# Patient Record
Sex: Male | Born: 1935 | Race: White | Hispanic: No | Smoking: Former smoker
Health system: Southern US, Community
[De-identification: ages and names within clinical notes are randomized; demographics above are authoritative.]

## PROBLEM LIST (undated history)

## (undated) DIAGNOSIS — R059 Cough, unspecified: Secondary | ICD-10-CM

## (undated) DIAGNOSIS — E785 Hyperlipidemia, unspecified: Secondary | ICD-10-CM

## (undated) DIAGNOSIS — E119 Type 2 diabetes mellitus without complications: Secondary | ICD-10-CM

## (undated) DIAGNOSIS — Z87442 Personal history of urinary calculi: Secondary | ICD-10-CM

## (undated) DIAGNOSIS — R05 Cough: Secondary | ICD-10-CM

## (undated) DIAGNOSIS — D649 Anemia, unspecified: Secondary | ICD-10-CM

## (undated) DIAGNOSIS — I4892 Unspecified atrial flutter: Secondary | ICD-10-CM

## (undated) DIAGNOSIS — J029 Acute pharyngitis, unspecified: Secondary | ICD-10-CM

## (undated) DIAGNOSIS — Z950 Presence of cardiac pacemaker: Secondary | ICD-10-CM

## (undated) DIAGNOSIS — R062 Wheezing: Secondary | ICD-10-CM

## (undated) DIAGNOSIS — I2510 Atherosclerotic heart disease of native coronary artery without angina pectoris: Secondary | ICD-10-CM

## (undated) DIAGNOSIS — Z951 Presence of aortocoronary bypass graft: Secondary | ICD-10-CM

## (undated) DIAGNOSIS — M1990 Unspecified osteoarthritis, unspecified site: Secondary | ICD-10-CM

## (undated) DIAGNOSIS — R197 Diarrhea, unspecified: Secondary | ICD-10-CM

## (undated) DIAGNOSIS — J449 Chronic obstructive pulmonary disease, unspecified: Secondary | ICD-10-CM

## (undated) DIAGNOSIS — I509 Heart failure, unspecified: Secondary | ICD-10-CM

## (undated) DIAGNOSIS — M7989 Other specified soft tissue disorders: Secondary | ICD-10-CM

## (undated) DIAGNOSIS — I5189 Other ill-defined heart diseases: Secondary | ICD-10-CM

## (undated) DIAGNOSIS — I10 Essential (primary) hypertension: Secondary | ICD-10-CM

## (undated) DIAGNOSIS — I441 Atrioventricular block, second degree: Secondary | ICD-10-CM

## (undated) HISTORY — DX: Cough, unspecified: R05.9

## (undated) HISTORY — DX: Atrioventricular block, second degree: I44.1

## (undated) HISTORY — DX: Diarrhea, unspecified: R19.7

## (undated) HISTORY — DX: Hyperlipidemia, unspecified: E78.5

## (undated) HISTORY — PX: OTHER SURGICAL HISTORY: SHX169

## (undated) HISTORY — DX: Type 2 diabetes mellitus without complications: E11.9

## (undated) HISTORY — DX: Unspecified atrial flutter: I48.92

## (undated) HISTORY — DX: Heart failure, unspecified: I50.9

## (undated) HISTORY — DX: Acute pharyngitis, unspecified: J02.9

## (undated) HISTORY — DX: Essential (primary) hypertension: I10

## (undated) HISTORY — DX: Presence of aortocoronary bypass graft: Z95.1

## (undated) HISTORY — DX: Cough: R05

## (undated) HISTORY — DX: Other ill-defined heart diseases: I51.89

## (undated) HISTORY — DX: Chronic obstructive pulmonary disease, unspecified: J44.9

## (undated) HISTORY — DX: Wheezing: R06.2

## (undated) HISTORY — PX: CORONARY ARTERY BYPASS GRAFT: SHX141

## (undated) HISTORY — DX: Other specified soft tissue disorders: M79.89

---

## 2009-03-10 ENCOUNTER — Emergency Department (HOSPITAL_COMMUNITY): Admission: EM | Admit: 2009-03-10 | Discharge: 2009-03-10 | Payer: Self-pay | Admitting: Family Medicine

## 2009-09-03 ENCOUNTER — Encounter: Admission: RE | Admit: 2009-09-03 | Discharge: 2009-09-03 | Payer: Self-pay | Admitting: General Surgery

## 2009-10-02 ENCOUNTER — Ambulatory Visit (HOSPITAL_COMMUNITY): Admission: RE | Admit: 2009-10-02 | Discharge: 2009-10-02 | Payer: Self-pay | Admitting: General Surgery

## 2010-05-22 LAB — DIFFERENTIAL
Basophils Absolute: 0 10*3/uL (ref 0.0–0.1)
Basophils Relative: 1 % (ref 0–1)
Eosinophils Absolute: 0.1 10*3/uL (ref 0.0–0.7)
Lymphocytes Relative: 32 % (ref 12–46)
Neutro Abs: 3.4 10*3/uL (ref 1.7–7.7)

## 2010-05-22 LAB — COMPREHENSIVE METABOLIC PANEL
AST: 18 U/L (ref 0–37)
Albumin: 4.3 g/dL (ref 3.5–5.2)
Alkaline Phosphatase: 34 U/L — ABNORMAL LOW (ref 39–117)
BUN: 11 mg/dL (ref 6–23)
CO2: 31 mEq/L (ref 19–32)
Calcium: 9.9 mg/dL (ref 8.4–10.5)
GFR calc non Af Amer: >60 mL/min (ref >60)
Potassium: 4.2 mEq/L (ref 3.5–5.1)
Sodium: 143 mEq/L (ref 135–145)
Total Bilirubin: 1.4 mg/dL — ABNORMAL HIGH (ref 0.3–1.2)
Total Protein: 7.7 g/dL (ref 6.0–8.3)

## 2010-05-22 LAB — GLUCOSE, CAPILLARY
Glucose-Capillary: 124 mg/dL — ABNORMAL HIGH (ref 70–99)
Glucose-Capillary: 129 mg/dL — ABNORMAL HIGH (ref 70–99)

## 2010-05-22 LAB — CBC
MCHC: 34.1 g/dL (ref 30.0–36.0)
Platelets: 131 10*3/uL — ABNORMAL LOW (ref 150–400)
RDW: 13.6 % (ref 11.5–15.5)

## 2010-05-22 LAB — SURGICAL PCR SCREEN: Staphylococcus aureus: POSITIVE — AB

## 2010-09-09 ENCOUNTER — Emergency Department (HOSPITAL_COMMUNITY): Payer: Medicare Other

## 2010-09-09 ENCOUNTER — Inpatient Hospital Stay (HOSPITAL_COMMUNITY)
Admission: EM | Admit: 2010-09-09 | Discharge: 2010-09-18 | DRG: 234 | Disposition: A | Discharge status: Home / Self Care | Payer: Medicare Other | Attending: Surgery | Admitting: Surgery

## 2010-09-09 DIAGNOSIS — Z7901 Long term (current) use of anticoagulants: Secondary | ICD-10-CM

## 2010-09-09 DIAGNOSIS — Y9210 Unspecified residential institution as the place of occurrence of the external cause: Secondary | ICD-10-CM | POA: Diagnosis not present

## 2010-09-09 DIAGNOSIS — E8779 Other fluid overload: Secondary | ICD-10-CM | POA: Diagnosis not present

## 2010-09-09 DIAGNOSIS — E1169 Type 2 diabetes mellitus with other specified complication: Secondary | ICD-10-CM | POA: Diagnosis not present

## 2010-09-09 DIAGNOSIS — D62 Acute posthemorrhagic anemia: Secondary | ICD-10-CM | POA: Diagnosis not present

## 2010-09-09 DIAGNOSIS — Z8711 Personal history of peptic ulcer disease: Secondary | ICD-10-CM

## 2010-09-09 DIAGNOSIS — I10 Essential (primary) hypertension: Secondary | ICD-10-CM | POA: Diagnosis present

## 2010-09-09 DIAGNOSIS — Z7982 Long term (current) use of aspirin: Secondary | ICD-10-CM

## 2010-09-09 DIAGNOSIS — Y832 Surgical operation with anastomosis, bypass or graft as the cause of abnormal reaction of the patient, or of later complication, without mention of misadventure at the time of the procedure: Secondary | ICD-10-CM | POA: Diagnosis not present

## 2010-09-09 DIAGNOSIS — E7800 Pure hypercholesterolemia, unspecified: Secondary | ICD-10-CM | POA: Diagnosis present

## 2010-09-09 DIAGNOSIS — I4510 Unspecified right bundle-branch block: Secondary | ICD-10-CM | POA: Diagnosis present

## 2010-09-09 DIAGNOSIS — E785 Hyperlipidemia, unspecified: Secondary | ICD-10-CM | POA: Diagnosis present

## 2010-09-09 DIAGNOSIS — Z79899 Other long term (current) drug therapy: Secondary | ICD-10-CM

## 2010-09-09 DIAGNOSIS — F17200 Nicotine dependence, unspecified, uncomplicated: Secondary | ICD-10-CM | POA: Diagnosis present

## 2010-09-09 DIAGNOSIS — Z8249 Family history of ischemic heart disease and other diseases of the circulatory system: Secondary | ICD-10-CM

## 2010-09-09 DIAGNOSIS — I2510 Atherosclerotic heart disease of native coronary artery without angina pectoris: Principal | ICD-10-CM | POA: Diagnosis present

## 2010-09-09 DIAGNOSIS — I4892 Unspecified atrial flutter: Secondary | ICD-10-CM | POA: Diagnosis not present

## 2010-09-09 DIAGNOSIS — I2582 Chronic total occlusion of coronary artery: Secondary | ICD-10-CM | POA: Diagnosis present

## 2010-09-09 DIAGNOSIS — I519 Heart disease, unspecified: Secondary | ICD-10-CM | POA: Diagnosis not present

## 2010-09-09 DIAGNOSIS — I200 Unstable angina: Secondary | ICD-10-CM | POA: Diagnosis present

## 2010-09-09 LAB — DIFFERENTIAL
Basophils Absolute: 0 10*3/uL (ref 0.0–0.1)
Basophils Relative: 1 % (ref 0–1)
Eosinophils Absolute: 0.1 10*3/uL (ref 0.0–0.7)
Lymphocytes Relative: 41 % (ref 12–46)
Neutro Abs: 3.1 10*3/uL (ref 1.7–7.7)

## 2010-09-09 LAB — BASIC METABOLIC PANEL
BUN: 17 mg/dL (ref 6–23)
Chloride: 104 mEq/L (ref 96–112)
Creatinine, Ser: 1.03 mg/dL (ref 0.50–1.35)
GFR calc Af Amer: >60 mL/min (ref >60)
Potassium: 3.5 mEq/L (ref 3.5–5.1)
Sodium: 144 mEq/L (ref 135–145)

## 2010-09-09 LAB — CK TOTAL AND CKMB (NOT AT ARMC)
CK, MB: 2.4 ng/mL (ref 0.3–4.0)
Relative Index: INVALID (ref 0.0–2.5)
Total CK: 80 U/L (ref 7–232)

## 2010-09-09 LAB — HEPATIC FUNCTION PANEL
ALT: 16 U/L (ref 0–53)
Albumin: 4.3 g/dL (ref 3.5–5.2)
Alkaline Phosphatase: 40 U/L (ref 39–117)
Indirect Bilirubin: 0.4 mg/dL (ref 0.3–0.9)
Total Protein: 7.5 g/dL (ref 6.0–8.3)

## 2010-09-09 LAB — CBC
MCH: 33.2 pg (ref 26.0–34.0)
MCHC: 35.4 g/dL (ref 30.0–36.0)
MCV: 93.7 fL (ref 78.0–100.0)
RDW: 13.1 % (ref 11.5–15.5)
WBC: 6.4 10*3/uL (ref 4.0–10.5)

## 2010-09-09 LAB — TROPONIN I: Troponin I: <0.3 ng/mL (ref <0.30)

## 2010-09-09 LAB — LIPASE, BLOOD: Lipase: 39 U/L (ref 11–59)

## 2010-09-10 DIAGNOSIS — I2510 Atherosclerotic heart disease of native coronary artery without angina pectoris: Secondary | ICD-10-CM

## 2010-09-10 HISTORY — PX: CARDIAC CATHETERIZATION: SHX172

## 2010-09-10 LAB — CARDIAC PANEL(CRET KIN+CKTOT+MB+TROPI)
CK, MB: 2 ng/mL (ref 0.3–4.0)
Relative Index: INVALID (ref 0.0–2.5)
Troponin I: <0.3 ng/mL (ref <0.30)

## 2010-09-10 LAB — CBC
MCV: 92.8 fL (ref 78.0–100.0)
RBC: 4.47 MIL/uL (ref 4.22–5.81)
RDW: 13 % (ref 11.5–15.5)
WBC: 9.3 10*3/uL (ref 4.0–10.5)

## 2010-09-10 LAB — COMPREHENSIVE METABOLIC PANEL
AST: 14 U/L (ref 0–37)
Alkaline Phosphatase: 32 U/L — ABNORMAL LOW (ref 39–117)
CO2: 28 mEq/L (ref 19–32)
Calcium: 9.5 mg/dL (ref 8.4–10.5)
Chloride: 103 mEq/L (ref 96–112)
Creatinine, Ser: 0.77 mg/dL (ref 0.50–1.35)
GFR calc non Af Amer: >60 mL/min (ref >60)
Potassium: 3.5 mEq/L (ref 3.5–5.1)
Total Bilirubin: 0.7 mg/dL (ref 0.3–1.2)

## 2010-09-10 LAB — MRSA PCR SCREENING: MRSA by PCR: NEGATIVE

## 2010-09-10 LAB — MAGNESIUM: Magnesium: 1.9 mg/dL (ref 1.5–2.5)

## 2010-09-10 LAB — GLUCOSE, CAPILLARY
Glucose-Capillary: 135 mg/dL — ABNORMAL HIGH (ref 70–99)
Glucose-Capillary: 141 mg/dL — ABNORMAL HIGH (ref 70–99)
Glucose-Capillary: 127 mg/dL — ABNORMAL HIGH (ref 70–99)

## 2010-09-10 LAB — APTT: aPTT: 31 seconds (ref 24–37)

## 2010-09-10 LAB — PROTIME-INR: INR: 0.88 (ref 0.00–1.49)

## 2010-09-10 LAB — LIPID PANEL
Total CHOL/HDL Ratio: 3.5 RATIO
Triglycerides: 94 mg/dL (ref <150)

## 2010-09-10 LAB — HEMOGLOBIN A1C
Hgb A1c MFr Bld: 6.6 % — ABNORMAL HIGH (ref <5.7)
Mean Plasma Glucose: 143 mg/dL — ABNORMAL HIGH (ref <117)

## 2010-09-11 DIAGNOSIS — Z01810 Encounter for preprocedural cardiovascular examination: Secondary | ICD-10-CM

## 2010-09-11 DIAGNOSIS — I2510 Atherosclerotic heart disease of native coronary artery without angina pectoris: Secondary | ICD-10-CM

## 2010-09-11 LAB — CBC
HCT: 36.1 % — ABNORMAL LOW (ref 39.0–52.0)
Hemoglobin: 12.7 g/dL — ABNORMAL LOW (ref 13.0–17.0)
MCH: 33.2 pg (ref 26.0–34.0)
MCHC: 35.2 g/dL (ref 30.0–36.0)

## 2010-09-11 LAB — BASIC METABOLIC PANEL
BUN: 9 mg/dL (ref 6–23)
Calcium: 8.8 mg/dL (ref 8.4–10.5)
Creatinine, Ser: 0.89 mg/dL (ref 0.50–1.35)
GFR calc non Af Amer: >60 mL/min (ref >60)
Glucose, Bld: 127 mg/dL — ABNORMAL HIGH (ref 70–99)

## 2010-09-12 LAB — COMPREHENSIVE METABOLIC PANEL
ALT: 12 U/L (ref 0–53)
AST: 13 U/L (ref 0–37)
Calcium: 8.7 mg/dL (ref 8.4–10.5)
GFR calc Af Amer: >60 mL/min (ref >60)
Glucose, Bld: 141 mg/dL — ABNORMAL HIGH (ref 70–99)
Sodium: 140 mEq/L (ref 135–145)
Total Protein: 6.6 g/dL (ref 6.0–8.3)

## 2010-09-12 LAB — BLOOD GAS, ARTERIAL
Drawn by: 31297
FIO2: 0.21 %
O2 Saturation: 97 %
pO2, Arterial: 84.2 mmHg (ref 80.0–100.0)

## 2010-09-12 LAB — TYPE AND SCREEN

## 2010-09-12 LAB — SURGICAL PCR SCREEN: MRSA, PCR: NEGATIVE

## 2010-09-12 LAB — CBC
MCH: 31.9 pg (ref 26.0–34.0)
MCHC: 34.1 g/dL (ref 30.0–36.0)
Platelets: 122 10*3/uL — ABNORMAL LOW (ref 150–400)
RDW: 13.1 % (ref 11.5–15.5)

## 2010-09-13 ENCOUNTER — Inpatient Hospital Stay (HOSPITAL_COMMUNITY): Payer: Medicare Other

## 2010-09-13 DIAGNOSIS — Z951 Presence of aortocoronary bypass graft: Secondary | ICD-10-CM

## 2010-09-13 DIAGNOSIS — I2510 Atherosclerotic heart disease of native coronary artery without angina pectoris: Secondary | ICD-10-CM

## 2010-09-13 HISTORY — DX: Presence of aortocoronary bypass graft: Z95.1

## 2010-09-13 HISTORY — PX: OTHER SURGICAL HISTORY: SHX169

## 2010-09-13 LAB — HEMOGLOBIN AND HEMATOCRIT, BLOOD: Hemoglobin: 10.2 g/dL — ABNORMAL LOW (ref 13.0–17.0)

## 2010-09-13 LAB — CBC
HCT: 35.6 % — ABNORMAL LOW (ref 39.0–52.0)
HCT: 29.3 % — ABNORMAL LOW (ref 39.0–52.0)
Hemoglobin: 12.5 g/dL — ABNORMAL LOW (ref 13.0–17.0)
Hemoglobin: 10.4 g/dL — ABNORMAL LOW (ref 13.0–17.0)
MCH: 32.9 pg (ref 26.0–34.0)
MCHC: 35.1 g/dL (ref 30.0–36.0)
MCHC: 35.5 g/dL (ref 30.0–36.0)
MCV: 93.7 fL (ref 78.0–100.0)
Platelets: 90 10*3/uL — ABNORMAL LOW (ref 150–400)
RBC: 3.15 MIL/uL — ABNORMAL LOW (ref 4.22–5.81)
RDW: 13 % (ref 11.5–15.5)
WBC: 6.4 10*3/uL (ref 4.0–10.5)

## 2010-09-13 LAB — POCT I-STAT 4, (NA,K, GLUC, HGB,HCT)
Glucose, Bld: 150 mg/dL — ABNORMAL HIGH (ref 70–99)
Glucose, Bld: 142 mg/dL — ABNORMAL HIGH (ref 70–99)
Glucose, Bld: 104 mg/dL — ABNORMAL HIGH (ref 70–99)
HCT: 37 % — ABNORMAL LOW (ref 39.0–52.0)
HCT: 28 % — ABNORMAL LOW (ref 39.0–52.0)
HCT: 30 % — ABNORMAL LOW (ref 39.0–52.0)
Hemoglobin: 12.6 g/dL — ABNORMAL LOW (ref 13.0–17.0)
Hemoglobin: 9.5 g/dL — ABNORMAL LOW (ref 13.0–17.0)
Hemoglobin: 10.2 g/dL — ABNORMAL LOW (ref 13.0–17.0)
Potassium: 3.8 mEq/L (ref 3.5–5.1)
Potassium: 4.9 mEq/L (ref 3.5–5.1)
Potassium: 3.5 mEq/L (ref 3.5–5.1)
Sodium: 141 mEq/L (ref 135–145)
Sodium: 137 mEq/L (ref 135–145)

## 2010-09-13 LAB — POCT I-STAT, CHEM 8
Chloride: 108 mEq/L (ref 96–112)
Creatinine, Ser: 0.8 mg/dL (ref 0.50–1.35)
Glucose, Bld: 168 mg/dL — ABNORMAL HIGH (ref 70–99)
HCT: 29 % — ABNORMAL LOW (ref 39.0–52.0)
Potassium: 4.5 mEq/L (ref 3.5–5.1)
Sodium: 142 mEq/L (ref 135–145)

## 2010-09-13 LAB — POCT I-STAT 3, ART BLOOD GAS (G3+)
Bicarbonate: 25.4 mEq/L — ABNORMAL HIGH (ref 20.0–24.0)
Bicarbonate: 23.5 mEq/L (ref 20.0–24.0)
O2 Saturation: 100 %
O2 Saturation: 98 %
Patient temperature: 35.5
TCO2: 24 mmol/L (ref 0–100)
pCO2 arterial: 40.6 mmHg (ref 35.0–45.0)
pH, Arterial: 7.419 (ref 7.350–7.450)
pH, Arterial: 7.355 (ref 7.350–7.450)
pO2, Arterial: 308 mmHg — ABNORMAL HIGH (ref 80.0–100.0)
pO2, Arterial: 118 mmHg — ABNORMAL HIGH (ref 80.0–100.0)

## 2010-09-13 LAB — PROTIME-INR: INR: 1.3 (ref 0.00–1.49)

## 2010-09-13 LAB — MAGNESIUM: Magnesium: 2.7 mg/dL — ABNORMAL HIGH (ref 1.5–2.5)

## 2010-09-13 LAB — APTT: aPTT: 37 seconds (ref 24–37)

## 2010-09-13 LAB — CREATININE, SERUM: GFR calc non Af Amer: >60 mL/min (ref >60)

## 2010-09-13 NOTE — Cardiovascular Report (Signed)
NAME:  Jesus Oconnor, HANNER.:  [id number]  MEDICAL RECORD NO.:  [id number]  LOCATION:  2032                         FACILITY:  [facility]  PHYSICIAN:  Rolland Porter, MD DATE OF BIRTH:  Jul 18, 1933  DATE OF PROCEDURE:  09/12/2010 DATE OF DISCHARGE:                           CARDIAC CATHETERIZATION   PRIMARY CARE PHYSICIAN:  Royetta Crochet. Karlton Lemon, MD  PRIMARY CARDIOLOGIST:  Rolland Porter, MD  PROCEDURE PERFORMED: 1. Left heart catheterization via the 5-French right radial access. 2. Left ventriculogram in the RAO projection with 12 mL of contrast     per second for a total of 25 mL. 3. Native coronary angiography.  HISTORY:  Jesus Oconnor is a very pleasant 77 year old gentleman who with a history of hypertension, dyslipidemia, diabetes, and smoking as well as intraventricular conduction delay on EKG as well as a strong family history of coronary disease including his brother who just had bypass surgery had some substernal chest discomfort after eating a large meal on the day prior to catheterization.  It continued intermittently throughout a couple of hours, so he went to the emergency room.  It was not relieved by nitroglycerin and cocktail.  However, based on the significance of his risk factors and the description of his discomfort, this was thought to be likely unstable angina and after the risks, benefits, alternatives, and indications of either stress test versus heart catheterization were discussed, the patient was in agreement to go forward with cardiac catheterization.  An informed consent was obtained with signed form placed on the chart.  INDICATIONS: 1. Unstable angina. 2. Significant cardiac risk factors.  PROCEDURE IN DETAIL:  The patient was brought to second floor cardiac catheterization lab in the fasting state.  He was prepped and draped in the usual sterile fashion for the right radial access after part Barbeau test demonstrated  excellent ulnar artery collaterals to the right hand. After time-out period was performed, the patient was sedated with intravenous Versed and fentanyl.  The right wrist was anesthetized using 1% subcutaneous lidocaine and the right radial artery was accessed using the Seldinger technique with placement of 5-French glide sheath.  The sheath was aspirated, flushed, and infiltrated with total of 10 mL of standard radial cocktail as described below.  Then, a 5-French TIG 4.0 catheter was advanced over the Versacore wire into the ascending aorta. It was then used to engage the right coronary artery.  Multiple angiographic views of right coronary system were obtained.  I was unable to engage the left coronary artery with this catheter and therefore was exchanged for a 5-French JL-4 catheter which easily engaged the left coronary artery.  Multiple angiographic views of the left coronary artery system were obtained.  This catheter was then exchanged for an angled pigtail catheter which was advanced into the left ventricle for obtaining left ventricular pressures and LV gram.  After this, the catheter was removed completely out of the body over the wire with no complications.  The patient was stable for, during, and after the procedure.  The sheath was removed in the cath lab with placement of TR band at 10 mL of air.  The patient was transferred to the holding area for  monitoring and then back to the Step-Down Unit for ongoing care.  CATHETERIZATION STATISTICS: 1. Sedation:  1 mg Versed and 50 mcg fentanyl. 2. Contrast:  90 mL. 3. Heparin 4500 units intravenous given at the time of sheath     placement. 4. Radial cocktail was consistent of 5 mg of verapamil and 400 mcg of     nitroglycerin and 2 mL of 1% lidocaine.  HEMODYNAMIC RESULTS: 1. Left ventricular pressures 161/11 mmHg.  EDP of 22 mmHg. 2. Central aortic pressure 157/84 mmHg.  ANGIOGRAHIC FINDINGS: 1. The left main is a  large-caliber vessel that does bifurcate into a     LAD and circumflex.  No significant disease is noted in the left     main itself. 2. The LAD is a large-caliber vessel that reaches down to the apex.     There is a proximal long 50-60% tubular stenosis within about a 70-     80% stenosis in the midportion right up at the takeoff of the     second diagonal branch.  There are 2 moderate-sized diagonal     branches both of which have 90% ostial stenoses. 3. The circumflex is occluded very proximally after a small     intermediate branch and it does fill with collaterals from the     right. 4. The right coronary artery is a large dominant vessel that has     diffuse irregularities throughout the entire portion.  There is a     very large posterior descending artery and there is one of three     posterolateral branches which I am calling RPL-4 has a 90% mid     stenosis.  This is still at least a 2-0 vessel at that level. 5. Left ventriculogram demonstrated preserved ejection fraction of at     least 55% with no significant wall motion abnormalities and no     mitral regurgitation.  There is definitely calcified mitral     annulus.  IMPRESSION: 1. Severe multivessel disease with 100% occluded circumflex with the     right to left collaterals, severe diffuse left anterior descending     disease involving the ostia of both diagonal 1 and diagonal 2     vessels.  Also, 80-90% distal right posterolateral 4 lesion. 2. Preserved left ventricular ejection fraction with mildly increased     end diastolic pressure.  PLAN: 1. Based on the patient's risk factors and extensive disease,     especially in the LAD for long term survival purposes, the patient     warrants evaluation for coronary artery bypass surgery. 2. We will continue the aspirin, beta-blocker, statin, and ACE     inhibitor. 3. We will restart Lovenox subcu for treatment dose for his unstable     angina and I have contacted  Cardiothoracic Surgery for     consultation.          ______________________________ Rolland Porter, MD     DWH/MEDQ  D:  09/12/2010  T:  09/12/2010  Job:  [id number]  cc:   [facility] Gilford Raid, M.D. Royetta Crochet. Karlton Lemon, M.D.  Electronically Signed by Glenetta Hew MD on 09/13/2010 01:18:15 AM

## 2010-09-14 ENCOUNTER — Inpatient Hospital Stay (HOSPITAL_COMMUNITY): Payer: Medicare Other

## 2010-09-14 DIAGNOSIS — E1165 Type 2 diabetes mellitus with hyperglycemia: Secondary | ICD-10-CM

## 2010-09-14 LAB — GLUCOSE, CAPILLARY
Glucose-Capillary: 104 mg/dL — ABNORMAL HIGH (ref 70–99)
Glucose-Capillary: 142 mg/dL — ABNORMAL HIGH (ref 70–99)
Glucose-Capillary: 173 mg/dL — ABNORMAL HIGH (ref 70–99)
Glucose-Capillary: 75 mg/dL (ref 70–99)
Glucose-Capillary: 155 mg/dL — ABNORMAL HIGH (ref 70–99)
Glucose-Capillary: 150 mg/dL — ABNORMAL HIGH (ref 70–99)

## 2010-09-14 LAB — CBC
MCV: 93.5 fL (ref 78.0–100.0)
Platelets: 134 10*3/uL — ABNORMAL LOW (ref 150–400)
RBC: 3.08 MIL/uL — ABNORMAL LOW (ref 4.22–5.81)
RDW: 13.2 % (ref 11.5–15.5)
WBC: 7.8 10*3/uL (ref 4.0–10.5)

## 2010-09-14 LAB — MAGNESIUM: Magnesium: 2.4 mg/dL (ref 1.5–2.5)

## 2010-09-14 LAB — BASIC METABOLIC PANEL
CO2: 25 mEq/L (ref 19–32)
Chloride: 109 mEq/L (ref 96–112)
Creatinine, Ser: 0.74 mg/dL (ref 0.50–1.35)
GFR calc Af Amer: >60 mL/min (ref >60)
Potassium: 4 mEq/L (ref 3.5–5.1)
Sodium: 140 mEq/L (ref 135–145)

## 2010-09-15 ENCOUNTER — Inpatient Hospital Stay (HOSPITAL_COMMUNITY): Payer: Medicare Other

## 2010-09-15 LAB — BASIC METABOLIC PANEL
BUN: 17 mg/dL (ref 6–23)
Calcium: 7.8 mg/dL — ABNORMAL LOW (ref 8.4–10.5)
Creatinine, Ser: 0.96 mg/dL (ref 0.50–1.35)
GFR calc Af Amer: >60 mL/min (ref >60)
GFR calc non Af Amer: >60 mL/min (ref >60)
Glucose, Bld: 134 mg/dL — ABNORMAL HIGH (ref 70–99)

## 2010-09-15 LAB — CBC
HCT: 27.5 % — ABNORMAL LOW (ref 39.0–52.0)
Hemoglobin: 9.5 g/dL — ABNORMAL LOW (ref 13.0–17.0)
MCH: 32.8 pg (ref 26.0–34.0)
MCHC: 34.5 g/dL (ref 30.0–36.0)
MCV: 94.8 fL (ref 78.0–100.0)
RDW: 13.6 % (ref 11.5–15.5)

## 2010-09-15 LAB — GLUCOSE, CAPILLARY

## 2010-09-16 LAB — GLUCOSE, CAPILLARY
Glucose-Capillary: 133 mg/dL — ABNORMAL HIGH (ref 70–99)
Glucose-Capillary: 109 mg/dL — ABNORMAL HIGH (ref 70–99)

## 2010-09-17 LAB — GLUCOSE, CAPILLARY

## 2010-09-18 LAB — GLUCOSE, CAPILLARY: Glucose-Capillary: 123 mg/dL — ABNORMAL HIGH (ref 70–99)

## 2010-09-18 LAB — PROTIME-INR
INR: 1.03 (ref 0.00–1.49)
Prothrombin Time: 13.7 seconds (ref 11.6–15.2)

## 2010-10-11 ENCOUNTER — Other Ambulatory Visit: Payer: Self-pay | Admitting: Surgery

## 2010-10-11 DIAGNOSIS — I2510 Atherosclerotic heart disease of native coronary artery without angina pectoris: Secondary | ICD-10-CM

## 2010-10-12 ENCOUNTER — Ambulatory Visit
Admission: RE | Admit: 2010-10-12 | Discharge: 2010-10-12 | Disposition: A | Discharge status: Home / Self Care | Payer: Medicare Other | Source: Ambulatory Visit | Attending: Surgery | Admitting: Surgery

## 2010-10-12 ENCOUNTER — Ambulatory Visit: Payer: Medicare Other | Admitting: Surgery

## 2010-10-12 ENCOUNTER — Encounter: Payer: Medicare Other | Admitting: Surgery

## 2010-10-12 ENCOUNTER — Encounter (INDEPENDENT_AMBULATORY_CARE_PROVIDER_SITE_OTHER): Payer: Self-pay | Admitting: Surgery

## 2010-10-12 DIAGNOSIS — I2510 Atherosclerotic heart disease of native coronary artery without angina pectoris: Secondary | ICD-10-CM

## 2010-10-12 NOTE — Assessment & Plan Note (Addendum)
OFFICE VISIT  TAY, HUWE DOB:  1934-03-03                                        October 12, 2010 CHART #:  [id number]  The patient returned to my office today for followup status post coronary bypass graft surgery x5 on September 13, 2010.  He did have some postoperative atrial flutter and was sent home on Coumadin with plans for eventual cardioversion if he did not convert on his own.  Since discharge, his only complaint is that he has had some lower extremity swelling, worse on the right than the left.  He had some in the hospital postoperatively and was sent home on Lasix for 1-week.  He denies any chest pain or pressure.  He has had no shortness of breath.  He has not been ambulating very much due to the hot weather.  On physical examination, blood pressure 140/64, pulse is 74 and regular, respiratory rate is 18 unlabored.  Oxygen saturation on room air is 95%. He looks well.  Cardiac exam shows an irregular rate and rhythm.  His lungs reveal decreased breath sounds in the bases.  The chest incision is healing well and the sternum is stable.  There is moderate edema in the right lower leg and mild edema in the left lower leg to the knee level.  His left leg vein harvest incision is healing well.  There is slight separation of the lowermost stab incision where the vein was transected.  A followup chest x-ray today shows bilateral pleural effusions, slightly larger on the left than the right with bilateral lower lobe atelectasis. His x-ray overall seems a little better than at the time of discharge.  His medications are: 1. Coumadin 5 mg daily. 2. Lisinopril 10 mg daily. 3. Lopressor 12.5 mg b.i.d. 4. Aspirin 81 mg daily. 5. Metformin 1000 mg b.i.d. 6. Pravachol 80 mg at bedtime. 7. Effexor 75 mg 1/2 tablet b.i.d. 8. Xanax 0.5 mg at bedtime p.r.n.  IMPRESSION:  Overall, the patient is making a satisfactory recovery following his surgery.  He  still has some volume excess and I started him on Lasix 40 mg daily and potassium 20 mEq daily for 1 month.  I also wrote him for oxycodone IR 5 mg q.6 hours p.r.n. for pain #40.  I asked him to keep his legs elevated when he is not ambulating and watch his sodium and fluid intake.  I would be seeing him back in about 4 weeks and we will repeat his chest x-ray at that time.  He will continue to follow up with Dr. Debara Pickett, for his cardiology care.  Gilford Raid, M.D. Electronically Signed  BB/MEDQ  D:  10/12/2010  T:  10/12/2010  Job:  [id number]  cc:   Mali Hilty, MD

## 2010-10-25 NOTE — Discharge Summary (Signed)
NAME:  Jesus Oconnor, Jesus Oconnor NO.:  [id number]  MEDICAL RECORD NO.:  [id number]  LOCATION:  2037                         FACILITY:  [facility]  PHYSICIAN:  Gilford Raid, M.D.     DATE OF BIRTH:  Mar 12, 1933  DATE OF ADMISSION:  09/09/2010 DATE OF DISCHARGE:  09/18/2010                              DISCHARGE SUMMARY   PRIMARY ADMITTING DIAGNOSIS:  Chest pain.  ADDITIONAL/DISCHARGE DIAGNOSES: 1. Severe 3-vessel coronary artery disease. 2. Unstable angina. 3. Hypertension. 4. Dyslipidemia. 5. Type 2 diabetes mellitus. 6. History of tobacco abuse. 7. Postoperative atrial flutter.  PROCEDURES PERFORMED: 1. Cardiac catheterization. 2. Coronary artery bypass grafting x5 (left internal mammary artery to     the LAD, saphenous vein graft to the obtuse marginal, sequential     saphenous vein graft to the first and second diagonals, saphenous     vein graft to the fourth posterolateral branch of the right     coronary artery). 3. Endoscopic vein harvest right leg.  HISTORY:  The patient is a 77 year old male who presented to the emergency department on the date of this admission complaining of new- onset substernal chest discomfort which began after eating.  His first set of cardiac enzymes in the ER was negative.  He was given 3 sublingual nitroglycerin and a GI cocktail, and his chest discomfort persisted, although it did improve in nature.  His initial EKG showed no ischemic changes.  He did have a right bundle-branch block.  He was subsequently admitted by [facility] vascular for further evaluation and treatment.  HOSPITAL COURSE:  Mr. Stockstill was admitted and started on IV nitroglycerin and heparin.  He underwent cardiac catheterization on September 10, 2010, by Dr. Ellyn Hack and found to have severe 3-vessel coronary artery disease including a 50-60% proximal LAD and 70-80% midvessel stenosis.  There were 2 moderate-sized diagonal branches, both of which had 90%  ostial stenosis.  Left circumflex is occluded after a small intermediate branch with filling of 2 marginals by collaterals from the right.  The right coronary artery is dominant vessel with diffuse disease.  There was a 90% mid posterolateral stenosis.  Left ventricular function was well-preserved and the EF was estimated at 55%.  Because of his diffuse 3-vessel disease, it was felt that he would benefit from coronary artery bypass grafting.  He was seen in consultation by Dr. Gilford Raid and films were reviewed.  Dr. Cyndia Bent agreed with the need for surgical revascularization.  He explained all risks, benefits, and alternatives of surgery to the patient.  He agreed to proceed.  He remained stable without further shortness of breath in the course of his preoperative workup.  He did undergo carotid Doppler studies which showed a 40-59% ICA stenosis on the left with no stenosis on the right. He was taken to the operating room on September 13, 2010, and underwent coronary artery bypass grafting x5 as described above.  Please see previously dictated operative report for complete details of surgery. He tolerated the procedure well and was transferred to the SICU in stable condition.  He was extubated shortly after surgery.  He was hemodynamically stable and doing well on postop day #1.  His chest tubes  and hemodynamic monitoring lines were removed and he was able to be transferred to the Step-Down Unit.  His postoperative course has been notable for intermittent atrial flutter with controlled rate.  He was started on a beta-blocker and his dosage has been titrated upward, but he continues to have rate-controlled atrial flutter.  Cardiology has followed the patient and was recommended anticoagulation at this point and possible DCCV in the future.  He has been started on Coumadin 5 mg daily to be titrated based on his INRs.  He otherwise has remained stable.  He has been afebrile and his vital signs  have been stable.  He has been somewhat volume overloaded and was started on Lasix to which he is responding well.  He does remain edematous on physical exam and his weight remains about 3 kg above his preoperative weight.  He was restarted on metformin and his dose was titrated upward for elevated blood glucoses.  Presently, he is remaining fairly well controlled running in the 130-160 range.  His incisions are all healing well.  He is ambulating in the halls with Cardiac Rehab Phase I and is progressing well.  He is tolerating a regular diet and having normal bowel and bladder function.  His most recent labs showed sodium 138, potassium 4.0, BUN 17, creatinine 0.96.  Hemoglobin 9.5, hematocrit 27.5, platelets 132, white count 9.0.  His most recent chest x-ray shows small bilateral pleural effusions.  His rhythm will continued to be observed over the next 24 hours.  If his rhythm and rate remain stable and he is cleared by Cardiology, he will potentially be ready for discharge within the next 24 hours.  If his rhythm changes or becomes unstable, he may require cardioversion sooner rather than later.  DISCHARGE MEDICATIONS: 1. Coumadin 5 mg daily or as directed. 2. Lasix 40 mg daily x7 days. 3. Potassium 40 mEq daily x 7 days. 4. Lisinopril 10 mg daily. 5. Metoprolol 25 mg b.i.d. 6. Oxycodone IR 5-10 mg q.3-4 h. p.r.n. for pain. 7. Aspirin 81 mg daily. 8. Metformin 1000 mg b.i.d. 9. Pravachol 80 mg nightly.  DISCHARGE INSTRUCTIONS:  He is asked to refrain from driving, heavy lifting or strenuous activity.  He may continue ambulating daily and using his incentive spirometer.  He may shower daily and clean his incisions with soap and water.  He will continue a low-fat, low-sodium carbohydrate modified diet.  DISCHARGE FOLLOWUP:  He will need to see Dr. Ellyn Hack in 2 weeks and follow up with the [facility] within the next 48 hours for management of  his anticoagulation.  He will follow up with Dr. Cyndia Bent on October 12, 2010, with a chest x-ray from [place].  In the interim if he experiences any problems or has questions, he is asked to contact our office immediately.     Suzzanne Cloud, P.A.   ______________________________ Gilford Raid, M.D.    GC/MEDQ  D:  09/17/2010  T:  09/18/2010  Job:  [id number]  cc:   Royetta Crochet. Karlton Lemon, M.D. Rolland Porter, MD [facility] Office  Electronically Signed by Suzzanne Cloud P.A. on 09/24/2010 03:14:18 PM Electronically Signed by Gilford Raid M.D. on 10/25/2010 03:55:40 PM

## 2010-10-25 NOTE — Consult Note (Signed)
NAME:  Jesus Oconnor, WAKELY NO.:  [id number]  MEDICAL RECORD NO.:  [id number]  LOCATION:  2920                         FACILITY:  [facility]  PHYSICIAN:  Gilford Raid, M.D.     DATE OF BIRTH:  1933/07/17  DATE OF CONSULTATION:  09/10/2010 DATE OF DISCHARGE:                                CONSULTATION   REFERRING PHYSICIAN:  Rolland Porter, MD  REASON FOR CONSULTATION:  Severe three-vessel coronary artery disease.  CLINICAL HISTORY:  I was asked by Dr. Ellyn Hack to evaluate Mr. Bingenheimer for consideration of coronary artery bypass graft surgery.  He is a 77- year-old gentleman with a history of hypertension, dyslipidemia, diabetes, smoking, and a strong family history of heart disease, who presented with new onset of substernal chest discomfort, beginning yesterday after eating.  This was different than his typical indigestion which he has fairly frequently.  He never had similar symptoms.  He presented to the emergency room and his first set of cardiac enzymes was negative.  His chest discomfort continued despite 3 sublingual nitroglycerin and GI cocktail although it was slightly improved to about 1/10.  His initial electrocardiogram showed no acute ischemic changes. There is a right bundle-branch block.  He underwent cardiac catheterization today which showed severe three-vessel disease.  The LAD had a long proximal 50-60% stenosis and then about 70-80% midvessel stenosis.  There were 2 moderate-sized diagonal branches, both of which had 90% ostial stenoses.  The left circumflex was occluded after a small intermediate branch with filling of 2 marginals by collaterals from the right.  The right coronary artery was a large dominant vessel that was diffusely diseased and irregular.  The only significant stenosis was that a posterolateral branch that was about 90% narrowed in its midportion.  Left ventricular function was well-preserved and ejection fraction estimated  at 55%.  REVIEW OF SYSTEMS:  GENERAL:  He denies any fever or chills.  He has had no recent weight changes.  He denies fatigue, but is not very active. EYES:  Negative.  ENT:  He has not seen a dentist in years.  He denies any pain in his mouth.  ENDOCRINE:  He has adult-onset diabetes.  Denies hypothyroidism.  CARDIOVASCULAR:  He denies exertional dyspnea.  He has no PND or orthopnea.  He denies peripheral edema or palpitations. RESPIRATORY:  He denies cough and sputum production.  GI:  He has had a history of peptic ulcer disease in the past, treated medically.  He denies any melena or bright red blood per rectum.  GU:  He denies dysuria and hematuria.  MUSCULOSKELETAL:  He denies arthralgias and myalgias.  NEUROLOGIC:  He denies any focal weakness or numbness.  He denies dizziness and syncope.  He has never had TIA or stroke. HEMATOLOGIC:  Negative. ALLERGIES:  Negative.  PSYCHIATRIC:  Negative.  PAST MEDICAL HISTORY:  Significant for hypertension, diabetes, and dyslipidemia.  He is status post left inguinal and umbilical hernia repair about 1 year ago.  FAMILY HISTORY:  Strongly positive for cardiac disease.  His father died of myocardial infarction.  He has brother who has had coronary artery bypass surgery.  He has multiple uncles who died of myocardial infarction  and other relatives with coronary artery disease.  SOCIAL HISTORY:  He is retired.  He is married for over 81 years and lives with his wife.  His 2 sons are here with him today.  He has smoked 1 pack of cigarettes per day for many years.  He denies alcohol abuse.  PHYSICAL EXAMINATION:  VITAL SIGNS:  His blood pressure is 170/88, pulse 75 and regular, respiratory rate is 16 and unlabored, oxygen saturation on 2 liters nasal cannula is 98%. GENERAL:  He is an elderly white male, in no distress. HEENT:  Normocephalic and atraumatic.  Pupils are equal and reactive to light and accommodation.  Extraocular muscles are  intact.  Oropharynx is clear with poor dentition. NECK:  Normal carotid pulses bilaterally.  There are no bruits.  There is no adenopathy or thyromegaly. CARDIAC:  Regular rate and rhythm with normal S1 and S2.  There is no murmur, rub, or gallop. LUNGS:  Clear. ABDOMEN:  Active bowel sounds.  His abdomen is soft, mildly obese, and nontender.  No palpable masses or organomegaly. EXTREMITIES:  No peripheral edema.  Dorsalis pedis pulses are palpable bilaterally.  Posterior tibial pulses are not palpable on either side. NEUROLOGIC:  Alert and oriented x3.  Motor and sensory exams grossly normal. SKIN:  Warm and dry.  LABORATORY EXAMINATION:  An initial troponin of less than 0.3.  His initial CPK was 80 with an MB of 2.4.  His second set of cardiac enzyme showed a troponin of less than 0.043 and his CPK of 61, MB of 2.0.  His hemoglobin A1c was 6.6.  TSH was 1.52.  Lipid profile showed total cholesterol 176, HDL 50, LDL 107, triglycerides 94.  His liver function profile was within normal limits with an albumin of 3.9.  MRSA screen was negative.  White blood cell count was 9.3, hemoglobin 14.6, platelet count 136,000.  His pro-BNP was 241.Chest x-ray showed chronic bronchitic changes, but no other abnormalities.  IMPRESSION:  Mr. Haefs has severe diffuse three-vessel coronary artery disease, presenting with new-onset postprandial chest pain.  I agree that coronary artery bypass graft surgery is the best treatment to prevent further ischemia and infarction in this diabetic gentleman.  I discussed the operative procedure with the patient and his two sons including alternatives, benefits, and risks including but not limited to bleeding, blood transfusion, infection, stroke, myocardial infarction, graft failure, and death.  I also discussed the importance of maximum cardiac risk factor reduction including good control of his diabetes, dyslipidemia, and hypertension as well as complete  smoking cessation. He understands and agrees to proceed.     Gilford Raid, M.D.     BB/MEDQ  D:  09/10/2010  T:  09/11/2010  Job:  [id number]  cc:   [facility]  Electronically Signed by Gilford Raid M.D. on 10/25/2010 03:55:34 PM

## 2010-10-25 NOTE — Op Note (Signed)
NAME:  Jesus Oconnor, Jesus Oconnor NO.:  [id number]  MEDICAL RECORD NO.:  [id number]  LOCATION:  2312                         FACILITY:  [facility]  PHYSICIAN:  Gilford Raid, M.D.     DATE OF BIRTH:  1933/06/14  DATE OF PROCEDURE:  09/13/2010 DATE OF DISCHARGE:                              OPERATIVE REPORT   PREOPERATIVE DIAGNOSIS:  Severe multivessel coronary artery disease.  POSTOPERATIVE DIAGNOSIS:  Severe multivessel coronary artery disease.  OPERATIVE PROCEDURE:  Median sternotomy, extracorporeal circulation, coronary artery bypass graft surgery x5 using a left internal mammary artery graft to left anterior descending coronary artery, sequential saphenous vein graft to the first and second diagonal branches of the LAD, a saphenous vein graft to the obtuse marginal branch of the left circumflex artery, and a saphenous vein graft to the fourth posterolateral branch of the right coronary artery, endoscopic vein harvesting from the right leg.  ATTENDING SURGEON:  Gilford Raid, MD.  ASSISTANTLars Pinks, PA.  ANESTHESIA:  General endotracheal.  CLINICAL HISTORY:  This patient is a 77 year old gentleman with history of hypertension, dyslipidemia, diabetes, smoking, a strong family history of heart disease, who presented with substernal chest discomfort of new onset.  His first set of cardiac enzymes was negative.  His pain was not relieved after three sublingual nitroglycerin.  He underwent cardiac catheterization which showed severe three-vessel disease.  The LAD had a long proximal 50%-60% stenosis and noted about 70%-80% midvessel stenosis.  There were two moderate-sized diagonal branches both of which had 90% ostial stenoses.  Left circumflex was occluded after a small intermediate branch with filling of two marginals by collaterals from the right.  The right coronary artery was a large dominant vessel and was diffusely diseased and irregular but  without significant stenosis.  There was a 90% focal stenosis in the midportion of the fourth posterolateral branch.  Left ventricular ejection fraction about 55%.  After review of the catheterization examination, the patient was felt that coronary artery bypass graft surgery was the best treatment.  I discussed the operative procedure with the patient and his son.  We discussed alternatives, benefits, and risks including but not limited to bleeding, blood transfusion, infection, stroke, myocardial infarction, graft failure, and death.  He understood all this and agreed to proceed.  OPERATIVE PROCEDURE:  The patient was taken to the operating room and placed on the table in supine position.  After induction of general endotracheal anesthesia, a Foley catheter was placed in the bladder using sterile technique.  Then, the chest, abdomen, and both lower extremities were prepped and draped in usual sterile manner.  Chest was entered through a median sternotomy incision.  The pericardium opened in midline.  Examination of the heart showed good ventricular contractility.  The ascending aorta had no palpable plaques in it.  Then, the left internal mammary artery was harvested from the chest wall as pedicle graft.  This was a medium caliber vessel with excellent blood flow through it.  At the same time, segment of greater saphenous vein was harvested from the right leg using endoscopic vein harvest technique.  This vein was a medium size and good quality.  The patient was then heparinized  when adequate ACT was obtained.  The distal ascending aorta was cannulated using a 22-French aortic cannula for arterial inflow.  Venous outflow was achieved using a two-stage venous cannula for the right atrial appendage.  An antegrade cardioplegia and vent cannula was inserted in aortic root.  The patient was placed on cardiopulmonary bypass.  The distal coronaries identified.  The LAD was diffusely  diseased with plaque, but was graftable at its distal portion.  The two diagonal branches were moderate-sized vessels that have some segmental plaque in them, but were graftable.  The left circumflex had a moderate-sized marginal branch that was suitable for grafting with mild distal disease in it and a smaller more distal marginal that was diffusely diseased and not a graftable size or quality.  The right coronary artery was a large vessel that was diffusely diseased and gave off posterior descending and about five posterolateral branches.  There is only mild distal disease in the branches except for the 90% focal stenosis in the midportion of the fourth posterolateral branch.  Then, the aorta was crossclamped and 1000 mL of cold blood antegrade cardioplegia was administered in the aortic root with quick arrest of the heart.  Systemic hypothermia to 20 degree centigrade and topical hypothermia with iced saline was used.  A temperature probe was placed in the septum and insulating pad in the pericardium.  The first distal anastomosis was performed to the obtuse marginal branch.  The internal diameter was 1.75 mm.  Conduit used was a segment of greater saphenous vein, and the anastomosis was performed in an end- to-side manner using continuous 7-0 Prolene suture.  Flow was noted through the graft and was excellent.  Second distal anastomosis was performed to the fourth posterolateral branch.  The internal diameter was 1.75 mm.  Conduit used was a second segment of greater saphenous vein, the anastomosis was performed in an end-to-side manner using continuous 7-0 Prolene suture.  Flow was noted through the graft and was excellent.  Then, the third distal anastomosis was performed to the first diagonal branch.  The internal diameter of this vessel was 1.6 mm.  Conduit used was a third segment of greater saphenous vein.  The anastomosis performed in a sequential side-to-side manner  continuous 7-0 Prolene suture.  Flow was noted through the graft and was excellent.  Fourth distal anastomosis was performed to the second diagonal branch. The internal diameter was 1.6 mm.  The conduit used, was the same segment of greater saphenous vein, anastomosis performed in a sequential end-to-side manner, continuous 7-0 Prolene suture.  Flow was noted through the graft and was excellent.  Then another dose of cardioplegia was given down the vein grafts and in the aortic root.  The fifth distal anastomosis was then performed to the distal LAD.  The internal diameter of this vessel was 1.75 mm.  The conduit used was a left internal mammary graft, was brought through an opening left pericardium, anterior the phrenic nerve.  It was anastomosed to the LAD in an end-to-side manner using continuous 8-0 Prolene suture.  The pedicle was sutured to the epicardium with 6-0 Prolene sutures.  The patient was rewarmed to 37 degrees centigrade.  With a crossclamp in place, the three proximal vein graft anastomoses were performed to the mid ascending aorta in end-to-side manner with continuous 6-0 Prolene suture.  Then, the clamp removed from mammary pedicle.  There was rapid warming of the ventricular septum and return of spontaneous ventricular fibrillation.  Crossclamp removed with the  time of 80 minutes.  The patient spontaneously converted to sinus rhythm.  The proximal and distal anastomoses appeared hemostatic while the graft is satisfactory. Graft markers were placed around the proximal anastomoses.  Two temporary right ventricular and right atrial pacing wires were placed and brought out through the skin.  When the patient rewarmed to 37 degrees centigrade, he was weaned from cardiopulmonary bypass on no inotropic agents.  Total bypass time was 100 minutes.  Cardiac function appeared excellent.  The cardiac output of 7 L/minute.  Protamine was given and the venous and aortic  cannulas were removed without difficulty.  Hemostasis was achieved.  Three chest tubes were placed, two in the post pericardium, one in the left pleural space, and one in the anterior mediastinum.  Sternum was then closed with double #6 stainless steel wires.  Fascia was closed with continuous #1 Vicryl suture.  Subcutaneous tissue was closed with continuous 2-0 Vicryl and the skin with 3-0 Vicryl subcuticular closure.  Lower extremity venous harvest site was closed in similar manner.  The sponge, needle, and instrument counts were correct according to the scrub nurse. Dry sterile dressing were applied over the incisions and around the chest tubes which were hooked to Pleur-Evac suction.  The patient remained hemodynamically stable and transferred to the SICU in guarded, but stable condition.     Gilford Raid, M.D.     BB/MEDQ  D:  09/13/2010  T:  09/14/2010  Job:  [id number]  cc:   [facility]  Electronically Signed by Gilford Raid M.D. on 10/25/2010 03:55:37 PM

## 2010-11-12 ENCOUNTER — Other Ambulatory Visit: Payer: Self-pay | Admitting: Surgery

## 2010-11-12 DIAGNOSIS — I2510 Atherosclerotic heart disease of native coronary artery without angina pectoris: Secondary | ICD-10-CM

## 2010-11-15 DIAGNOSIS — I2510 Atherosclerotic heart disease of native coronary artery without angina pectoris: Secondary | ICD-10-CM

## 2010-11-15 DIAGNOSIS — I4892 Unspecified atrial flutter: Secondary | ICD-10-CM

## 2010-11-15 DIAGNOSIS — I10 Essential (primary) hypertension: Secondary | ICD-10-CM

## 2010-11-15 DIAGNOSIS — E119 Type 2 diabetes mellitus without complications: Secondary | ICD-10-CM

## 2010-11-15 DIAGNOSIS — E785 Hyperlipidemia, unspecified: Secondary | ICD-10-CM

## 2010-11-16 ENCOUNTER — Ambulatory Visit: Payer: Self-pay | Admitting: Surgery

## 2010-11-16 ENCOUNTER — Ambulatory Visit
Admission: RE | Admit: 2010-11-16 | Discharge: 2010-11-16 | Disposition: A | Discharge status: Home / Self Care | Payer: Medicare Other | Source: Ambulatory Visit | Attending: Surgery | Admitting: Surgery

## 2010-11-16 ENCOUNTER — Ambulatory Visit: Payer: Medicare Other | Admitting: Surgery

## 2010-11-16 DIAGNOSIS — I2510 Atherosclerotic heart disease of native coronary artery without angina pectoris: Secondary | ICD-10-CM

## 2010-11-19 ENCOUNTER — Other Ambulatory Visit: Payer: Self-pay | Admitting: Surgery

## 2010-11-19 DIAGNOSIS — I2510 Atherosclerotic heart disease of native coronary artery without angina pectoris: Secondary | ICD-10-CM

## 2010-11-23 ENCOUNTER — Ambulatory Visit (INDEPENDENT_AMBULATORY_CARE_PROVIDER_SITE_OTHER): Payer: Self-pay | Admitting: Surgery

## 2010-11-23 ENCOUNTER — Encounter: Payer: Self-pay | Admitting: Surgery

## 2010-11-23 VITALS — BP 181/88 | HR 72 | Resp 16 | Ht 69.0 in | Wt 213.0 lb

## 2010-11-23 DIAGNOSIS — I2510 Atherosclerotic heart disease of native coronary artery without angina pectoris: Secondary | ICD-10-CM

## 2010-11-23 DIAGNOSIS — J90 Pleural effusion, not elsewhere classified: Secondary | ICD-10-CM

## 2010-11-23 NOTE — Patient Instructions (Signed)
You may return to driving when you feel comfortable with that.  Do not lift anything heavier than 10 lbs for three months postoperatively. Return to see me if any problems develop with you incision; such as redness, swelling, or drainage.

## 2010-11-23 NOTE — Progress Notes (Signed)
  HPI: Patient returns for routine postoperative follow-up having undergone coronary bypass graft surgery x5 on 09/13/2010. The patient's early postoperative recovery while in the hospital was notable for postoperative rate controlled atrial flutter. He was started on Coumadin with a plan to perform cardioversion in the future if needed. Since hospital discharge the patient reports he has been feeling well overall. He is in walking short distances without chest pain or shortness of breath .   Current Outpatient Prescriptions  Medication Sig Dispense Refill  . aspirin 81 MG tablet Take 81 mg by mouth daily.        Marland Kitchen lisinopril (PRINIVIL,ZESTRIL) 10 MG tablet Take 10 mg by mouth daily.        . metFORMIN (GLUCOPHAGE) 1000 MG tablet Take 1,000 mg by mouth 2 (two) times daily with a meal.        . metoprolol succinate (TOPROL-XL) 25 MG 24 hr tablet Take 25 mg by mouth 2 (two) times daily.        . pravastatin (PRAVACHOL) 80 MG tablet Take 80 mg by mouth daily.        Marland Kitchen warfarin (COUMADIN) 5 MG tablet Take 5 mg by mouth daily.        Marland Kitchen oxycodone (OXY-IR) 5 MG capsule Take 5 mg by mouth every 4 (four) hours as needed.          Physical Exam: He looks well. Cardiac exam shows a regular rate and rhythm with normal heart sounds. Lung exam is clear. The chest incision is healing well and the sternum is stable. His leg incisions healing well and there is no peripheral edema.   Diagnostic Tests: Chest x-ray from 11/16/2010 shows a small left pleural effusion and trace right with mild bibasilar atelectasis.  Impression: Overall Mr. Baggarly is making a good recovery following his surgery. I encouraged him to continue walking as best possible. I told him he can return to driving a car at this time it should refrain from lifting anything heavier than 10 pounds for a total of 3 months and date of surgery.  Plan: He will continue to followup with Dr. Ellyn Hack and will have his INR followed in the  [facility] and vascular Coumadin clinic. He will return to see me if he develops any problems with his incisions

## 2010-12-06 HISTORY — PX: CHOLECYSTECTOMY: SHX55

## 2010-12-19 ENCOUNTER — Emergency Department (HOSPITAL_COMMUNITY): Payer: Medicare Other

## 2010-12-19 ENCOUNTER — Inpatient Hospital Stay (HOSPITAL_COMMUNITY)
Admission: EM | Admit: 2010-12-19 | Discharge: 2010-12-24 | DRG: 417 | Disposition: A | Discharge status: Home / Self Care | Payer: Medicare Other | Attending: Internal Medicine | Admitting: Internal Medicine

## 2010-12-19 DIAGNOSIS — Z87891 Personal history of nicotine dependence: Secondary | ICD-10-CM

## 2010-12-19 DIAGNOSIS — I2510 Atherosclerotic heart disease of native coronary artery without angina pectoris: Secondary | ICD-10-CM | POA: Diagnosis present

## 2010-12-19 DIAGNOSIS — R1011 Right upper quadrant pain: Secondary | ICD-10-CM

## 2010-12-19 DIAGNOSIS — E785 Hyperlipidemia, unspecified: Secondary | ICD-10-CM | POA: Diagnosis present

## 2010-12-19 DIAGNOSIS — R1013 Epigastric pain: Secondary | ICD-10-CM

## 2010-12-19 DIAGNOSIS — I495 Sick sinus syndrome: Secondary | ICD-10-CM | POA: Diagnosis present

## 2010-12-19 DIAGNOSIS — R740 Nonspecific elevation of levels of transaminase and lactic acid dehydrogenase [LDH]: Secondary | ICD-10-CM

## 2010-12-19 DIAGNOSIS — Z79899 Other long term (current) drug therapy: Secondary | ICD-10-CM

## 2010-12-19 DIAGNOSIS — I453 Trifascicular block: Secondary | ICD-10-CM | POA: Diagnosis present

## 2010-12-19 DIAGNOSIS — I509 Heart failure, unspecified: Secondary | ICD-10-CM | POA: Diagnosis present

## 2010-12-19 DIAGNOSIS — Z7901 Long term (current) use of anticoagulants: Secondary | ICD-10-CM

## 2010-12-19 DIAGNOSIS — Z951 Presence of aortocoronary bypass graft: Secondary | ICD-10-CM

## 2010-12-19 DIAGNOSIS — I10 Essential (primary) hypertension: Secondary | ICD-10-CM | POA: Diagnosis present

## 2010-12-19 DIAGNOSIS — I5031 Acute diastolic (congestive) heart failure: Secondary | ICD-10-CM | POA: Diagnosis present

## 2010-12-19 DIAGNOSIS — K8020 Calculus of gallbladder without cholecystitis without obstruction: Secondary | ICD-10-CM

## 2010-12-19 DIAGNOSIS — E119 Type 2 diabetes mellitus without complications: Secondary | ICD-10-CM | POA: Diagnosis present

## 2010-12-19 LAB — COMPREHENSIVE METABOLIC PANEL
ALT: 291 U/L — ABNORMAL HIGH (ref 0–53)
AST: 474 U/L — ABNORMAL HIGH (ref 0–37)
Alkaline Phosphatase: 85 U/L (ref 39–117)
CO2: 27 mEq/L (ref 19–32)
Calcium: 9.6 mg/dL (ref 8.4–10.5)
Chloride: 103 mEq/L (ref 96–112)
GFR calc Af Amer: >90 mL/min (ref >90)
GFR calc non Af Amer: >90 mL/min (ref >90)
Glucose, Bld: 123 mg/dL — ABNORMAL HIGH (ref 70–99)
Potassium: 3.9 mEq/L (ref 3.5–5.1)
Sodium: 141 mEq/L (ref 135–145)

## 2010-12-19 LAB — CBC
HCT: 36.9 % — ABNORMAL LOW (ref 39.0–52.0)
Hemoglobin: 11.8 g/dL — ABNORMAL LOW (ref 13.0–17.0)
MCH: 27.2 pg (ref 26.0–34.0)
MCHC: 32 g/dL (ref 30.0–36.0)
MCV: 85 fL (ref 78.0–100.0)
RBC: 4.34 MIL/uL (ref 4.22–5.81)

## 2010-12-19 LAB — CARDIAC PANEL(CRET KIN+CKTOT+MB+TROPI)
CK, MB: 2.3 ng/mL (ref 0.3–4.0)
Relative Index: INVALID (ref 0.0–2.5)
Relative Index: INVALID (ref 0.0–2.5)
Total CK: 70 U/L (ref 7–232)
Troponin I: <0.3 ng/mL (ref <0.30)

## 2010-12-19 LAB — DIFFERENTIAL
Basophils Relative: 1 % (ref 0–1)
Lymphocytes Relative: 35 % (ref 12–46)
Lymphs Abs: 1.7 10*3/uL (ref 0.7–4.0)
Monocytes Absolute: 0.5 10*3/uL (ref 0.1–1.0)
Monocytes Relative: 10 % (ref 3–12)
Neutro Abs: 2.6 10*3/uL (ref 1.7–7.7)
Neutrophils Relative %: 53 % (ref 43–77)

## 2010-12-19 LAB — POCT I-STAT, CHEM 8
BUN: 16 mg/dL (ref 6–23)
Creatinine, Ser: 0.7 mg/dL (ref 0.50–1.35)
Hemoglobin: 13.6 g/dL (ref 13.0–17.0)
Potassium: 3.9 mEq/L (ref 3.5–5.1)
Sodium: 144 mEq/L (ref 135–145)

## 2010-12-19 LAB — POCT I-STAT TROPONIN I: Troponin i, poc: 0.05 ng/mL (ref 0.00–0.08)

## 2010-12-19 LAB — URINALYSIS, ROUTINE W REFLEX MICROSCOPIC
Bilirubin Urine: NEGATIVE
Hgb urine dipstick: NEGATIVE
Nitrite: NEGATIVE
Specific Gravity, Urine: 1.007 (ref 1.005–1.030)
Urobilinogen, UA: 1 mg/dL (ref 0.0–1.0)
pH: 6.5 (ref 5.0–8.0)

## 2010-12-19 LAB — PROTIME-INR: INR: 2.91 — ABNORMAL HIGH (ref 0.00–1.49)

## 2010-12-19 LAB — GLUCOSE, CAPILLARY: Glucose-Capillary: 118 mg/dL — ABNORMAL HIGH (ref 70–99)

## 2010-12-20 ENCOUNTER — Inpatient Hospital Stay (HOSPITAL_COMMUNITY): Payer: Medicare Other

## 2010-12-20 LAB — COMPREHENSIVE METABOLIC PANEL
ALT: 428 U/L — ABNORMAL HIGH (ref 0–53)
AST: 306 U/L — ABNORMAL HIGH (ref 0–37)
Albumin: 4.2 g/dL (ref 3.5–5.2)
Alkaline Phosphatase: 101 U/L (ref 39–117)
Chloride: 100 mEq/L (ref 96–112)
Creatinine, Ser: 0.72 mg/dL (ref 0.50–1.35)
Potassium: 3.8 mEq/L (ref 3.5–5.1)
Sodium: 140 mEq/L (ref 135–145)
Total Bilirubin: 3.3 mg/dL — ABNORMAL HIGH (ref 0.3–1.2)

## 2010-12-20 LAB — CBC
HCT: 36.9 % — ABNORMAL LOW (ref 39.0–52.0)
Hemoglobin: 11.8 g/dL — ABNORMAL LOW (ref 13.0–17.0)
MCV: 83.7 fL (ref 78.0–100.0)
RBC: 4.41 MIL/uL (ref 4.22–5.81)
WBC: 5.7 10*3/uL (ref 4.0–10.5)

## 2010-12-20 LAB — GLUCOSE, CAPILLARY

## 2010-12-20 LAB — DIFFERENTIAL
Basophils Absolute: 0 10*3/uL (ref 0.0–0.1)
Lymphocytes Relative: 25 % (ref 12–46)
Lymphs Abs: 1.4 10*3/uL (ref 0.7–4.0)
Neutro Abs: 3.6 10*3/uL (ref 1.7–7.7)

## 2010-12-20 LAB — PROTIME-INR: INR: 2.63 — ABNORMAL HIGH (ref 0.00–1.49)

## 2010-12-20 LAB — PRO B NATRIURETIC PEPTIDE: Pro B Natriuretic peptide (BNP): 566.4 pg/mL — ABNORMAL HIGH (ref 0–450)

## 2010-12-20 MED ORDER — GADOBENATE DIMEGLUMINE 529 MG/ML IV SOLN
15.0000 mL | Freq: Once | INTRAVENOUS | Status: AC
  Administered 2010-12-20: 15 mL via INTRAVENOUS

## 2010-12-21 LAB — COMPREHENSIVE METABOLIC PANEL
ALT: 279 U/L — ABNORMAL HIGH (ref 0–53)
Alkaline Phosphatase: 84 U/L (ref 39–117)
CO2: 29 mEq/L (ref 19–32)
GFR calc Af Amer: >90 mL/min (ref >90)
GFR calc non Af Amer: 88 mL/min — ABNORMAL LOW (ref >90)
Glucose, Bld: 108 mg/dL — ABNORMAL HIGH (ref 70–99)
Potassium: 3.7 mEq/L (ref 3.5–5.1)
Sodium: 139 mEq/L (ref 135–145)
Total Bilirubin: 2.1 mg/dL — ABNORMAL HIGH (ref 0.3–1.2)

## 2010-12-21 LAB — PROTIME-INR
INR: 2.45 — ABNORMAL HIGH (ref 0.00–1.49)
Prothrombin Time: 27 seconds — ABNORMAL HIGH (ref 11.6–15.2)

## 2010-12-21 LAB — LIPASE, BLOOD: Lipase: 26 U/L (ref 11–59)

## 2010-12-21 LAB — CBC
HCT: 36.3 % — ABNORMAL LOW (ref 39.0–52.0)
Hemoglobin: 11.6 g/dL — ABNORMAL LOW (ref 13.0–17.0)
MCH: 26.8 pg (ref 26.0–34.0)
MCHC: 32 g/dL (ref 30.0–36.0)
MCV: 83.8 fL (ref 78.0–100.0)
Platelets: 160 10*3/uL (ref 150–400)
RBC: 4.33 MIL/uL (ref 4.22–5.81)
RDW: 14.6 % (ref 11.5–15.5)
WBC: 6 10*3/uL (ref 4.0–10.5)

## 2010-12-21 LAB — GLUCOSE, CAPILLARY: Glucose-Capillary: 115 mg/dL — ABNORMAL HIGH (ref 70–99)

## 2010-12-22 ENCOUNTER — Inpatient Hospital Stay (HOSPITAL_COMMUNITY): Payer: Medicare Other

## 2010-12-22 ENCOUNTER — Other Ambulatory Visit (INDEPENDENT_AMBULATORY_CARE_PROVIDER_SITE_OTHER): Payer: Self-pay | Admitting: Surgery

## 2010-12-22 DIAGNOSIS — K824 Cholesterolosis of gallbladder: Secondary | ICD-10-CM

## 2010-12-22 DIAGNOSIS — K811 Chronic cholecystitis: Secondary | ICD-10-CM

## 2010-12-22 LAB — PROTIME-INR
INR: 1.26 (ref 0.00–1.49)
Prothrombin Time: 16.1 seconds — ABNORMAL HIGH (ref 11.6–15.2)

## 2010-12-22 LAB — GLUCOSE, CAPILLARY
Glucose-Capillary: 118 mg/dL — ABNORMAL HIGH (ref 70–99)
Glucose-Capillary: 98 mg/dL (ref 70–99)
Glucose-Capillary: 119 mg/dL — ABNORMAL HIGH (ref 70–99)
Glucose-Capillary: 136 mg/dL — ABNORMAL HIGH (ref 70–99)

## 2010-12-22 LAB — COMPREHENSIVE METABOLIC PANEL
AST: 52 U/L — ABNORMAL HIGH (ref 0–37)
Albumin: 4.2 g/dL (ref 3.5–5.2)
Chloride: 100 mEq/L (ref 96–112)
Creatinine, Ser: 0.78 mg/dL (ref 0.50–1.35)
Potassium: 4.1 mEq/L (ref 3.5–5.1)
Total Bilirubin: 2 mg/dL — ABNORMAL HIGH (ref 0.3–1.2)

## 2010-12-22 LAB — CBC
Hemoglobin: 12.2 g/dL — ABNORMAL LOW (ref 13.0–17.0)
MCHC: 32.1 g/dL (ref 30.0–36.0)
RBC: 4.55 MIL/uL (ref 4.22–5.81)

## 2010-12-22 LAB — MRSA PCR SCREENING: MRSA by PCR: NEGATIVE

## 2010-12-23 LAB — URINE MICROSCOPIC-ADD ON

## 2010-12-23 LAB — URINALYSIS, ROUTINE W REFLEX MICROSCOPIC
Leukocytes, UA: NEGATIVE
Nitrite: NEGATIVE
Specific Gravity, Urine: 1.016 (ref 1.005–1.030)
Urobilinogen, UA: 0.2 mg/dL (ref 0.0–1.0)

## 2010-12-23 LAB — PROTIME-INR
INR: 1.16 (ref 0.00–1.49)
Prothrombin Time: 15 seconds (ref 11.6–15.2)

## 2010-12-23 NOTE — Op Note (Signed)
NAME:  Jesus Oconnor, DOUGE NO.:  [id number]  MEDICAL RECORD NO.:  [id number]  LOCATION:  [id number]                         FACILITY:  [facility]  PHYSICIAN:  Earnstine Regal, MD      DATE OF BIRTH:  Jul 21, 1933  DATE OF PROCEDURE:  12/22/2010                               OPERATIVE REPORT   PREOPERATIVE DIAGNOSIS:  Symptomatic cholelithiasis.  POSTOPERATIVE DIAGNOSIS:  Symptomatic cholelithiasis.  PROCEDURE:  Laparoscopic cholecystectomy with intraoperative cholangiography.  SURGEON:  Earnstine Regal, MD, FACS  ASSISTANT:  Ascencion Dike, PA-C.  ANESTHESIA:  General per Dr. Roberts Gaudy.  ESTIMATED BLOOD LOSS:  Minimal.  PREPARATION:  ChloraPrep.  COMPLICATIONS:  None.  INDICATIONS:  The patient is a 77 year old white male admitted to the Medical Service with abdominal pain.  Workup revealed multiple gallstones.  Liver function tests were moderately elevated.  The patient was seen in consultation by Gastroenterology.  MRCP showed no evidence of common bile duct stones.  The patient was seen by Cardiology and cleared for surgery.  The patient is now brought to the operating room for cholecystectomy.  BODY OF REPORT:  Procedure is done in OR #17 at the [facility].  The patient was brought to the operating room, placed in a supine position on the operating room table.  Following administration of general anesthesia, the patient was positioned and then prepped and draped in the usual strict aseptic fashion.  After ascertaining that an adequate level of anesthesia had been achieved, an infraumbilical incision was made transversely.  Dissection was carried down to the fascia.  Fascia was incised in the midline and the peritoneal cavity was entered cautiously.  A 0-Vicryl pursestring sutures placed in the fascia.  An Hasson cannula was introduced under direct vision and secured with a pursestring suture.  Abdomen was insufflated with carbon dioxide.   Laparoscope was introduced and the abdomen explored.  Operative ports were placed in the right upper quadrant in the midline, midclavicular line, and anterior axillary line. There are adhesions of the omentum to the undersurface of the liver. The transverse colon was also closely approximated to the liver.  Using the Endoshears, adhesions to the undersurface of the liver were taken down until the fundus of the gallbladder was identified.  Dissection was then carried down the wall of the gallbladder exposing the entire surface of the gallbladder.  Care was taken to avoid injury to the duodenum, which was also partially adherent to the gallbladder wall. Gallbladder was completely dissected out.  Peritoneum was incised at the neck of the gallbladder.  Cystic duct was dissected out along its length.  A clip was placed at the neck of the gallbladder.  Cystic duct was incised and clear yellow bile emanates from the cystic duct.  A Cook cholangiography catheter was introduced through a stab wound in the right upper quadrant.  It was inserted into the cystic duct and secured with a Ligaclip.  Using C-arm fluoroscopy, real time cholangiography was performed.  There was rapid filling of the biliary tree.  There was no significant dilatation.  There was free flow distally into the duodenum without filling defect or obstruction.  There was reflux of  contrast into both the right and left hepatic ductal systems.  Clip was withdrawn and Cook catheter was removed from the peritoneal cavity.  Cystic duct was triply clipped and divided.  Cystic artery was dissected out.  Both the posterior and anterior branches of the cystic artery were doubly clipped and divided.  Gallbladder was then excised from the gallbladder bed using the hook electrocautery for hemostasis. Gallbladder was completely excised and placed into an EndoCatch bag.  It was withdrawn through the umbilical port without difficulty.   0-Vicryl pursestring suture at the umbilicus was tied securely.  Right upper quadrant was irrigated with warm saline, which was evacuated.  Good hemostasis was noted.  Ports were removed under direct vision and pneumoperitoneum released.  Good hemostasis was noted at all port sites. Port sites were anesthetized with local anesthetic.  Wounds were closed with interrupted 4-0 Monocryl subcuticular sutures.  Wounds were washed and dried.  Benzoin and Steri-Strips were applied.  Sterile dressings were applied.  The patient was awakened from anesthesia and brought to the recovery room.  The patient tolerated the procedure well.   Earnstine Regal, MD, FACS     TMG/MEDQ  D:  12/22/2010  T:  12/22/2010  Job:  [id number]  Electronically Signed by Armandina Gemma MD on 12/23/2010 09:15:31 AM

## 2010-12-24 LAB — BASIC METABOLIC PANEL
CO2: 29 mEq/L (ref 19–32)
Calcium: 9.6 mg/dL (ref 8.4–10.5)
Creatinine, Ser: 0.78 mg/dL (ref 0.50–1.35)
Glucose, Bld: 129 mg/dL — ABNORMAL HIGH (ref 70–99)

## 2010-12-26 LAB — URINE CULTURE: Colony Count: >=100000

## 2010-12-30 NOTE — Consult Note (Signed)
NAME:  Jesus Oconnor, Jesus Oconnor NO.:  [id number]  MEDICAL RECORD NO.:  [id number]  LOCATION:  [id number]                         FACILITY:  [facility]  PHYSICIAN:  Ronald Lobo, M.D.   DATE OF BIRTH:  02-09-1934  DATE OF CONSULTATION:  12/20/2010 DATE OF DISCHARGE:                                CONSULTATION   Dr. Greer Pickerel of the Surgical Service asked me to see this 77 year old gentleman because of possible choledocholithiasis.  The patient was admitted to the hospital yesterday with abdominal pain for rule out MI, 3 months status post a 5 vessel bypass.  The pain was atypical and not thought to be due to his heart, but he was brought in for rule out nonetheless.  His cardiac enzymes were negative.  Meanwhile, it was noted that his liver chemistries were elevated, with normal lipase but transaminases in the 300-500 range.  An abdominal ultrasound showed gallstones with a normal caliber CBD.  Since admission, he has been comfortable.  We are being asked to see the patient because of possible need for ERCP if choledocholithiasis is confirmed.  PAST MEDICAL HISTORY:  No known allergies.  OUTPATIENT MEDICATIONS:  Aspirin, Coumadin, Lasix, lisinopril, metformin, metoprolol, p.r.n. oxycodone, potassium chloride, and Pravachol.  OPERATIONS:  The above-mentioned 5-vessel CABG about 3 months ago, as well as a subsequent hernia repair.  He also has history of hypertension, dyslipidemia, type 2 diabetes, and previous ethanol abuse. He had postop atrial flutter which is why I assume he is currently on Coumadin.  HABITS:  Recent smoking, nondrinker.  FAMILY HISTORY:  Negative for colon cancer.  SOCIAL HISTORY:  Married.  REVIEW OF SYSTEMS:  Mild constipation for which he uses occasional milk of magnesia, no rectal bleeding or diarrhea.  PHYSICAL EXAMINATION:  This was performed on October 16, the date of this dictation, since the patient was in the MRI scanner at the time  of my initial consultation.  He is anicteric without overt pallor.  Chest clear. Heart normal.  Abdomen without any tenderness, guarding, or mass effect at this time.  LABORATORY DATA:  Admission labs showed elevated liver chemistries with bilirubin of 1.6 that went up to 3.3 but today, the date of dictation, October 16, has fallen to 2.1.  Alk phos normal.  Enzymes had fallen progressively, AST dropping from 474 to 306 to 114, and ALT dropping from 291 to actually going up transiently to 428 but now down to 279. Lipase normal on admission and again on October 16, the date of this dictation.  INR today is 2.45.  Renal function normal.  Hemoglobin following hydration 11.6, white count consistently normal, platelets normal.  IMPRESSION: 1. Cholelithiasis. 2. Elevation of liver chemistries, which incidentally were known to     have been normal on July 8 of this year, so this is new onset.     They are falling fairly abruptly since admission, suggestive of     passage of a common duct stone.  An MRCP was performed yesterday     evening which was negative for evidence of a common duct stone.  RECOMMENDATIONS:  In view of the improvement in liver chemistries and the negative MRCP, I would favor  surgical management at whatever timing the surgeons feel is appropriate.  We will be on standby in the event that the patient should have a rise in his liver chemistries or a positive intraoperative cholangiogram.  Concerning the issue of colon cancer screening, the patient indicates he had a colonoscopy many years ago.  In view of his age of 44 years, to say nothing of his medical comorbidities, I do not feel that further routine screening is needed.  He is not microcytic or significantly anemic, he does not have worrisome lower tract symptoms, and has no family history of colon cancer.          ______________________________ Ronald Lobo, M.D.     RB/MEDQ  D:  12/21/2010  T:   12/21/2010  Job:  [id number]  Electronically Signed by Ronald Lobo M.D. on 12/30/2010 02:49:15 PM

## 2010-12-31 NOTE — Discharge Summary (Addendum)
NAME:  Jesus Oconnor, NIBERT NO.:  [id number]  MEDICAL RECORD NO.:  [id number]  LOCATION:  [id number]                         FACILITY:  [facility]  PHYSICIAN:  Dr. Debara Pickett              DATE OF BIRTH:  Oct 28, 1933  DATE OF ADMISSION:  12/19/2010 DATE OF DISCHARGE:  12/24/2010                              DISCHARGE SUMMARY   DISCHARGE DIAGNOSES: 1. Status post laparoscopic cholecystectomy. 2. Congestive heart failure, mild. 3. History of paroxysmal atrial fibrillation after coronary artery     bypass grafting in July 2012. 4. Trifascicular block. 5. Sick sinus syndrome. 6. Hypertension. 7. Diabetes mellitus type 2. 8. Dyslipidemia. 9. Chronic anticoagulation with Coumadin.  HOSPITAL COURSE:  Jesus Oconnor is a 77 year old Caucasian male with a history of coronary artery disease, status post coronary artery bypass grafting x5 in September 13, 2010.  He also has a history of hypertension, dyslipidemia, diabetes mellitus, remote tobacco abuse, right bundle- branch block, and postop atrial flutter from July 2012.  He presented to the emergency department with complaints of abdominal discomfort and nausea, which started about 2 hours after he had eaten dinner.  Upon arriving in the emergency department, his blood pressure was A999333 systolic, nitroglycerin paste was applied and given sublingually.  EKG revealed sinus rhythm with first-degree AV block and right bundle.  SGOT was elevated at 474 and SGPT of 291, lipase 244.  The patient was admitted to telemetry.  Cardiac enzymes were cycled to rule out myocardial infarction.  He was started on IV diuretics with 40 mg of Lasix b.i.d. and then switched to p.o. the following day.  His Coumadin was held.  A surgery consult was requested.  We will monitor strict Is and Os and obtain daily weights.  Initial chest x-ray showed congestive changes in the heart and lungs of interstitial edema with improving left pleural effusion and basilar  atelectasis.  Ultrasound of the abdomen was also completed, which showed cholelithiasis with mild gallbladder wall thickening and cholesterolosis of the gallbladder.  MRI of the abdomen without and with contrast was completed and showed no evidence of biliary ductal dilatation or common duct stone, small left pleural effusion.  There is cholelithiasis without significant evidence of cholecystitis.  He has apparent gastric wall thickening involving the proximal and mid body.  This could be due to under distention.  Similar appearance to CT scan on June 2011.  Beta-blocker had been increased; however, the patient became bradycardic, so this was decreased to 12.5 mg in the morning and 25 mg in the evening.  A GI consult was also requested.  LFTs continued to improve.  The patient was given vitamin K, then scheduled for cholecystectomy, which was completed on October 17. On 17 postop, the patient was unable to urinate, bladder scan showed 988 mL, in and out cath was completed, voiding out 990 mL.  His lisinopril was increased to 20 mg daily and then on October 19, we added Norvasc 2.5 mg daily.  The patient's only complaint is some abdominal soreness. He has been seen by Dr. Debara Pickett and feels he is stable for discharge home.  DISCHARGE LABS:  WBC 5.2, hemoglobin 12.2,  hematocrit 38.0, platelets 174.  PT 15.0, and INR 1.16.  Sodium 139, potassium 3.6, chloride 99, carbon dioxide 29, glucose 129, BUN 12, creatinine 0.78, calcium 9.6. Most recent total bilirubin was 2.0, alkaline phosphatase 77, AST 52, ALT 191, total protein 76, albumin of 4.2, those values were on October 17.  Urinalysis on October 18, showed large blood, protein of 30, negative nitrite, rare squamous, and few bacteria.  Culture is pending. He is MRSA negative.  Cardiac enzymes were negative x2.  Initial BNP was 735.5, which decreased to 566.4.  STUDIES/PROCEDURES: 1. Chest x-ray, December 20, 2010, shows decrease in  interstitial     edema.  Persistent pleural effusions. 2. Abdominal ultrasound, December 19, 2010, showed cholelithiasis with     mild gallbladder wall thickening.  Cholesterolosis of the     gallbladder. 3. MRCP, December 21, 2010, showed no evidence of biliary ductal     dilatation or common duct stone.  Small left pleural effusion.     Cholelithiasis without specific evidence of cholecystitis.     Apparent gastric wall thickening involving the proximal and mid-     body.  This could be due to under distention. 4. Intraoperative cholangiogram as above.  No discrete intraluminal     filling defects to suggest presence of choledocholithiasis.  DISCHARGE MEDICATIONS: 1. Acetaminophen 325 mg 2 tablets by mouth every 4 hours as needed for     pain. 2. Lisinopril 20 mg 1 tab by mouth daily. 3. Metoprolol 25 mg one-half a tablet in the morning and one tablet in     the evening. 4. Norvasc 2.5 mg 1 tab by mouth daily. 5. Alprazolam 0.5 mg 1 tab by mouth twice daily. 6. Aspirin 81 mg 1 tab by mouth daily. 7. Furosemide 40 mg 1 tab by mouth daily. 8. Metformin 1000 mg 1 tab by mouth twice daily. 9. Pravachol 80 mg 1 tab by mouth daily at bedtime. 10.Potassium chloride 20 mEq 1 tab by mouth daily. 11.Warfarin 1.5 one and half tablets on Mondays and two tablets all     other days.  DISPOSITION:  Jesus Oconnor will be discharged home in stable condition. Recommended to increase activity slowly.  Walk with assistance.  May shower.  No bath tubs.  No lifting for 3 weeks greater than 15 pounds. He is recommended to eat heart-healthy diet, low in sodium.  He will follow up with Dr. Debara Pickett on Friday, October 26, and with Surgery on November 6 at 1:50 p.m.    ______________________________ Tarri Fuller, PA   ______________________________ Dr. Caprice Kluver  D:  12/24/2010  T:  12/24/2010  Job:  [id number]  cc:   [place]. Karlton Lemon, M.D.  Electronically Signed by  Raliegh Ip. HILTY M.D. on 12/31/2010 08:07:02 AM Electronically Signed by Tarri Fuller PA on 12/31/2010 11:07:44 AM

## 2011-01-05 NOTE — H&P (Signed)
NAME:  Jesus Oconnor, Jesus Oconnor NO.:  [id number]  MEDICAL RECORD NO.:  [id number]  LOCATION:  [facility]                         FACILITY:  [facility]  PHYSICIAN:  Shelva Majestic, M.D.     DATE OF BIRTH:  07-11-1933  DATE OF ADMISSION:  12/19/2010 DATE OF DISCHARGE:                             HISTORY & PHYSICAL   CHIEF COMPLAINT:  Hypertension and abdominal pain.  HISTORY OF PRESENT ILLNESS:  Mr. Jesus Oconnor is a 77 year old white male with a history of coronary atherosclerotic heart disease, status post coronary artery bypass graft x5 on September 13, 2010.  He also has a history of hypertension, dyslipidemia, diabetes mellitus, and remote tobacco use.  He presented to the emergency department last evening with complaints of abdominal discomfort, nausea, and "sour belching."  His pain started about 2 hours after he ate dinner.  He complains of discomfort just below his sternum which caused him quite a bit of anxiety given he has just recently undergone coronary artery bypass graft.  He went to the drug store to take his blood pressure and found that it was very high.  On arrival to the emergency department, his blood pressure was A999333 systolic.  He had nitroglycerin paste applied and was given sublingual nitroglycerin initially with mild decrease in his blood pressure to around 99991111 systolic.  His initial troponin was negative.  His EKG revealed sinus rhythm with first-degree AV block and right bundle-branch block.  However, his LFTs were elevated with SGOT of 474 and SGPT of 291.  His lipase was 44.  He underwent abdominal ultrasound which revealed small stones in the gallbladder neck with mild diffuse gallbladder wall thickening.  We were consulted to assist with management of his elevated blood pressure and his chest x-ray, which revealed congestive changes in the heart and lungs with interstitial edema.  Currently, he denies any abdominal discomfort.  He is diuresing and thus far has  diuresed over 1 L of fluid.  His blood pressure remains around 123XX123 systolic.  His abdominal discomfort has subsided.  PAST MEDICAL HISTORY: 1. Coronary atherosclerotic heart disease, status post coronary artery     bypass graft x5 on September 11, 2010, with a LIMA placed to the LAD,     sequential saphenous vein graft to the first and second diagonal     branches of the LAD and saphenous vein graft to the obtuse marginal     branch of the left circumflex artery and a saphenous vein graft to     the fourth posterolateral branch of the right coronary art artery. 2. Ejection fraction 55-60%. 3. Hypertension. 4. Dyslipidemia. 5. Right bundle-branch block. 6. Type 2 diabetes mellitus. 7. Remote tobacco use.  He quit in July 2012. 8. Postoperative atrial flutter, currently in sinus rhythm.  FAMILY HISTORY:  Premature coronary artery disease.  Father died at 52 with myocardial infarction.  SOCIAL HISTORY:  He is married.  He quit smoking in July 2012.  No alcohol use.  ALLERGIES:  None known.  CURRENT MEDICATIONS:1. Aspirin 81 mg daily. 2. Coumadin 5 mg daily. 3. Lasix 40 mg daily. 4. Lisinopril 10 mg daily. 5. Metformin 1000 mg b.i.d. 6. Metoprolol 25 mg b.i.d. 7.  Oxycodone 5-10 mg q.3-4 h. p.r.n. 8. KCl 40 mEq daily. 9. Pravachol 80 mg at bedtime.  REVIEW OF SYSTEMS:  GENERAL:  He still has episodes of increased fatigue; however, his activity tolerance is improving.  His appetite is improving.  HEENT:  No visual changes.  No nasal congestion or sore throat.  CARDIOVASCULAR:  Denies any tachycardia or palpitations that he has noted.  No frank chest pain.  No orthopnea or PND.  He has had some lower extremity edema on the right leg, which has improved with the addition of Lasix.  LUNGS:  He continues to experience coughing which at times is productive, nonpurulent.  No notable wheezing.  ABDOMEN: Epigastric discomfort with nausea and water brash.  No melena or hematochezia.   EXTREMITIES:  He has had some pain in the right lower extremity, as well as edema, which has improved.  NEUROLOGIC:  No headache, numbness or weakness.  He does tell me that he becomes very emotionally easily and becomes angry very easily.  PHYSICAL EXAMINATION:  VITAL SIGNS:  Blood pressure is 178/100, pulse is 80 and regular, respirations 20. GENERAL:  This is a 77 year old white male, in no acute distress. HEENT:  Pupils are equal and reactive to light and accommodation. Extraocular movements intact.  Dentition is poor. NECK:  Supple.  No notable JVD or carotid bruits. CARDIOVASCULAR:  Regular rate and rhythm.  S1, S2 without appreciable murmur, gallop, or rub. LUNGS:  Diminished bilateral bases without wheezing, rales, or rhonchi. ABDOMEN:  Firm, nontender.  Bowel sounds are present all 4 quadrants. Negative Murphy sign. EXTREMITIES:  Radial, femoral, dorsal pedal pulses are present.  There is trace lower extremity edema on the right, none on the left. NEUROLOGIC:  Oriented to person, place, and time.  Normal mood and affect.  Cranial nerves 2 through 12 are grossly intact. SKIN:  Warm and dry.  LABORATORY DATA:  UA is negative.  CBC:  White blood cell count is 4.9, hemoglobin is 11.8, hematocrit is 36.9.  Troponin is 0.01.  PTT is 30.9, INR is 2.91.  Glucose is 123.  Alk phos was 85,  SGOT is 474, SGPT is 291.  Lipase is 44.  BNP is 735.5 and the second troponin was 0.05.  IMPRESSION: 1. Epigastric discomfort, likely secondary to cholelithiasis. 2. Hypertension. 3. Congestive heart failure, acute secondary to diastolic dysfunction. 4. Coronary artery disease, status post coronary artery bypass graft. 5. Coagulopathy secondary to Coumadin. 6. Postop atrial flutter. 7. Dyslipidemia.  PLAN:  We will admit to telemetry.  Cycle his enzymes to rule out myocardial infarction.  We will continue diuresing with 40 mg of Lasix IV b.i.d. today and then 40 mg p.o. b.i.d. starting  tomorrow.  We will hold his Coumadin for now until he is seen by Surgery, and we will also hold his pravastatin given his elevated liver function studies.  We will monitor strict I's and Os, and obtain daily weights.  Further recommendations are pending the opinion of surgery.    ______________________________ Blair Dolphin, NP   ______________________________ Shelva Majestic, M.D.    LS/MEDQ  D:  12/19/2010  T:  12/19/2010  Job:  [id number]  cc:   [facility] Royetta Crochet. Karlton Lemon, M.D.  Electronically Signed by Bari Mantis NP on 01/02/2011 09:54:28 PM Electronically Signed by Shelva Majestic M.D. on 01/05/2011 01:28:03 PM

## 2011-01-08 NOTE — Consult Note (Signed)
NAME:  Jesus Oconnor, Jesus Oconnor NO.:  [id number]  MEDICAL RECORD NO.:  [id number]  LOCATION:  [id number]                         FACILITY:  [facility]  PHYSICIAN:  Leighton Ruff. Redmond Pulling, MD     DATE OF BIRTH:  [birth year]  DATE OF CONSULTATION: DATE OF DISCHARGE:                                CONSULTATION   PHYSICIAN REQUESTING CONSULTATION:  Dr. Claiborne Billings.  REASON FOR CONSULTATION:  Abdominal pain, gallstones.  CHIEF COMPLAINT:  Stomach pain.  HISTORY OF PRESENT ILLNESS:  The patient is a 77 year old Caucasian male with a history of coronary artery disease, underwent a 5-vessel CABG in July, 2012, who developed acute onset of epigastric pain, around 7 o'clock last night after eating a hot dog.  The pain was centered in his upper abdomen.  It was constant without any radiation.  It was associated with "stye or belches."  He had no fever, chills, or vomiting.  The pain eased up over the next couple hours, but returned early in the morning around 2 a.m.  He got a little bit anxious about the pain, because it was similar in nature to his chest pain he had when he had his heart surgery, therefore he came to the emergency room for evaluation.  In the emergency room, he was found to have a systolic pressure around [id number], was also found to have volume overload and he started diuresing and diuresed over a liter.  During his workup, he was found to have elevated transaminases and elevated bilirubin and ultrasound was done, which demonstrated gallstones with questionable gallbladder wall thickening.  Therefore, Surgery is consulted.  The patient denies any prior symptoms.  He does have some infrequent stools at baseline.  He denies any diarrhea or acholic stools.  He denies any jaundice or unplanned weight loss.  He denies any family history OF colon or GI cancer.  He states that his pain is gone now and he tolerated some clears for dinner.  PAST MEDICAL HISTORY: 1. Coronary artery disease. 2.  Hypertension. 3. Dyslipidemia. 4. Diabetes mellitus. 5. Right bundle-branch block. 6. History of postoperative atrial flutter, on Coumadin.  PAST SURGICAL HISTORY: 1. Five-vessel CABG in July, 2012. 2. Left inguinal hernia 3. Umbilical hernia repair.  MEDICATIONS:  Aspirin, Coumadin, Lasix, lisinopril, metformin, metoprolol, oxycodone, potassium chloride, and Pravachol.  FAMILY HISTORY:  Coronary artery disease and acute MI.  SOCIAL HISTORY:  Former smoker, he quit in July, 2012.  He used to drink alcohol as well, but quit in July.  He denies any drugs.  He is married.  REVIEW OF SYSTEMS:  A comprehensive 12-point review of systems was performed.  All systems are negative except what was mentioned in the HPI.  He does endorse some nocturia.  PHYSICAL EXAMINATION:  VITAL SIGNS:  Temperature 98.3, heart rate 74, blood pressure 143/78, respirations 18, and satting 93% on room air. GENERAL:  Well-developed, well-nourished Caucasian male in no apparent distress. HEENT:  Atraumatic and normocephalic.  Pupils are equal.  He has got some muddy sclerae.  He does have some poor detention. NECK:  Supple.  No lymphadenopathy.  Trachea is midline.  No JVD. PULMONARY:  Lungs are clear and symmetric. CHEST:  No  rales.  No accessory use of muscle. CARDIOVASCULAR:  Regular rhythm, 2+ radial and femoral pulses.  He has got a well-healed sternotomy incision. ABDOMEN:  A little bit protuberant.  Otherwise, soft, nontender, no guarding, no rebound.  He has got a well-healed small transverse infraumbilical incision. MUSCULOSKELETAL:  Moves all extremities.  No obvious deficits in strength or joint deformity. SKIN:  He has got some 1+ edema in his right lower extremity. NEURO:  Nonfocal.  Sensation grossly intact. PSYCHIATRIC:  Alert and oriented.  Judgment and insight appear appropriate.  LABS:  Cardiac enzymes x3 are negative.  Sodium 141, potassium 3.9, chloride 103, bicarb 27, BUN 16,  creatinine 0.67, blood sugar 123, total bilirubin 1.6, AST 474, ALT 291, alk phosphatase 85, albumin 4.3, INR 2.91, PTT 30.9, white count 4.9, hemoglobin 11.8, hematocrit 36.9, platelet count 164.  Urinalysis is negative.  Lipase 44.  BNP 735.5.  RADIOGRAPHS: 1. Chest x-ray shows improving left pleural effusion and pulmonary     edema 2. Ultrasound of the abdomen which I reviewed as well showed     gallstones of the neck, some subtle gallbladder wall thickening and     cholesterolosis, common bile duct was normal and left pleural     effusion.  IMPRESSION:  A 77 year old Caucasian male with; 1. Symptomatic cholelithiasis, possible early acute cholecystitis. 2. Hypertension. 3. Congestive heart failure. 4. Dyslipidemia. 5. Coronary artery disease, status post coronary artery bypass graft. 6. Anticoagulation. 7. Anemia. 8. Diabetes mellitus.  PLAN:  I think this is most consistent with symptomatic cholelithiasis. They are very few features to suggest an acute cholecystitis.  The only feature suggesting acute cholecystitis with mild gallbladder wall thickening.  His transaminases and total bilirubin are elevated suggesting of passing a stone.  I do think we need to definitely repeat his LFTs in the morning.  If they are normalizing and he remained pain free, then I think it comes down to a potential timing of cholecystectomy.  If everything returns normal, it may be prudent to allow him to recuperate further from his CABG and let his heart failure improve.  If, however, his LFTs increased tomorrow, then he will need a GI Medicine Consult for ERCP versus MRCP to evaluate the common bile duct to look for common bile duct stone.  In the interim, I will hold the Coumadin with the INR normalizes until we see what his LFTs and his clinical exam is doing.  I would also a repeat coags and CBC in the morning.  We will follow along in consultation. Thank you for this consult.     Leighton Ruff.  Redmond Pulling, MD     EMW/MEDQ  D:  12/19/2010  T:  12/20/2010  Job:  [id number]  cc:   Dr. Claiborne Billings  Electronically Signed by Greer Pickerel M.D. on 01/08/2011 12:13:08 PM

## 2011-01-11 ENCOUNTER — Ambulatory Visit (INDEPENDENT_AMBULATORY_CARE_PROVIDER_SITE_OTHER): Payer: Medicare Other | Admitting: General Surgery

## 2011-01-11 ENCOUNTER — Encounter (INDEPENDENT_AMBULATORY_CARE_PROVIDER_SITE_OTHER): Payer: Self-pay | Admitting: General Surgery

## 2011-01-11 VITALS — BP 148/80 | HR 80 | Temp 98.4°F | Resp 20 | Ht 71.0 in | Wt 212.5 lb

## 2011-01-11 DIAGNOSIS — K811 Chronic cholecystitis: Secondary | ICD-10-CM

## 2011-01-11 NOTE — Patient Instructions (Signed)
Call if you have any further problems.  May wash incisions with soam\p and water.

## 2011-01-11 NOTE — Progress Notes (Signed)
Jesus Oconnor May 03, 1933 [id number] 01/11/2011   Jesus Oconnor is a 77 y.o. male who had a laparoscopic cholecystectomy with intraoperative cholangiogram [id number] Dr. Harlow Asa.  The pathology report confirmed Chrionic Inflammation and cholesterolosis. A unit and   The patient reports that they are feeling well with normal bowel movements and good appetite.  The pre-operative symptoms of abdominal pain, nausea, and vomiting have resolved.    Physical examination - Incisions appear well-healed with no sign of infection or bleeding.   Abdomen - soft, non-tender  Impression:  s/p laparoscopic cholecystectomy  Plan:  He may resume a regular diet and full activity.  He may follow-up on a PRN basis.

## 2011-04-19 ENCOUNTER — Other Ambulatory Visit: Payer: Self-pay | Admitting: Family Medicine

## 2011-04-19 ENCOUNTER — Ambulatory Visit
Admission: RE | Admit: 2011-04-19 | Discharge: 2011-04-19 | Disposition: A | Discharge status: Home / Self Care | Payer: Medicare Other | Source: Ambulatory Visit | Attending: Family Medicine | Admitting: Family Medicine

## 2011-04-19 DIAGNOSIS — R509 Fever, unspecified: Secondary | ICD-10-CM

## 2011-04-19 DIAGNOSIS — R05 Cough: Secondary | ICD-10-CM

## 2011-04-19 DIAGNOSIS — R0600 Dyspnea, unspecified: Secondary | ICD-10-CM

## 2011-08-16 ENCOUNTER — Emergency Department (HOSPITAL_COMMUNITY)
Admission: EM | Admit: 2011-08-16 | Discharge: 2011-08-16 | Disposition: A | Discharge status: Home / Self Care | Payer: Medicare Other | Source: Home / Self Care | Attending: Emergency Medicine | Admitting: Emergency Medicine

## 2011-08-16 ENCOUNTER — Encounter (HOSPITAL_COMMUNITY): Payer: Self-pay

## 2011-08-16 DIAGNOSIS — J209 Acute bronchitis, unspecified: Secondary | ICD-10-CM

## 2011-08-16 MED ORDER — BENZONATATE 200 MG PO CAPS: 200.0000 mg | ORAL_CAPSULE | Freq: Three times a day (TID) | ORAL | Status: AC | PRN

## 2011-08-16 MED ORDER — ALBUTEROL SULFATE HFA 108 (90 BASE) MCG/ACT IN AERS
1.0000 | INHALATION_SPRAY | Freq: Four times a day (QID) | RESPIRATORY_TRACT | Status: DC | PRN
Start: 2011-08-16 — End: 2012-12-27

## 2011-08-16 MED ORDER — AMOXICILLIN 500 MG PO CAPS: 1000.0000 mg | ORAL_CAPSULE | Freq: Three times a day (TID) | ORAL | Status: AC

## 2011-08-16 NOTE — Discharge Instructions (Signed)

## 2011-08-16 NOTE — ED Provider Notes (Signed)
Chief Complaint  Patient presents with  . Cough    History of Present Illness:   The patient is a 78 year old male who has had a three-day history of cough productive of white sputum, aching in the chest when he coughs, nasal congestion with clear rhinorrhea, headache, sore throat, and has felt hot. He denies any wheezing, shortness of breath, earache, stiff neck, nausea, vomiting, or diarrhea. He's had no prior history of asthma or COPD. He quit smoking years ago.  Review of Systems:  Other than noted above, the patient denies any of the following symptoms. Systemic:  No fever, chills, sweats, fatigue, myalgias, headache, or anorexia. Eye:  No redness, pain or drainage. ENT:  No earache, ear congestion, nasal congestion, sneezing, rhinorrhea, sinus pressure, sinus pain, post nasal drip, or sore throat. Lungs:  No cough, sputum production, wheezing, shortness of breath, or chest pain. GI:  No abdominal pain, nausea, vomiting, or diarrhea. Skin:  No rash or itching.  [place]:  Past medical history, family history, social history, meds, and allergies were reviewed.  Physical Exam:   Vital signs:  BP 142/62  Pulse 58  Temp(Src) 98.4 F (36.9 C) (Oral)  Resp 16  SpO2 98% General:  Alert, in no distress. Eye:  No conjunctival injection or drainage. Lids were normal. ENT:  TMs and canals were normal, without erythema or inflammation.  Nasal mucosa was clear and uncongested, without drainage.  Mucous membranes were moist.  Pharynx was clear, without exudate or drainage.  There were no oral ulcerations or lesions. Neck:  Supple, no adenopathy, tenderness or mass. Lungs:  No respiratory distress.  Lungs were clear to auscultation, without wheezes, rales or rhonchi.  Breath sounds were clear and equal bilaterally. Lungs were resonant to percussion.  No egophony. Heart:  Regular rhythm, without gallops, murmers or rubs. Skin:  Clear, warm, and dry, without rash or lesions.  Assessment:  The  encounter diagnosis was Acute bronchitis.  Plan:   1.  The following meds were prescribed:   New Prescriptions   ALBUTEROL (PROVENTIL HFA;VENTOLIN HFA) 108 (90 BASE) MCG/ACT INHALER    Inhale 1-2 puffs into the lungs every 6 (six) hours as needed for wheezing.   AMOXICILLIN (AMOXIL) 500 MG CAPSULE    Take 2 capsules (1,000 mg total) by mouth 3 (three) times daily.   BENZONATATE (TESSALON) 200 MG CAPSULE    Take 1 capsule (200 mg total) by mouth 3 (three) times daily as needed for cough.   2.  The patient was instructed in symptomatic care and handouts were given. 3.  The patient was told to return if becoming worse in any way, if no better in 3 or 4 days, and given some red flag symptoms that would indicate earlier return.   Harden Mo, MD 08/16/11 [phone]

## 2011-08-16 NOTE — ED Notes (Signed)
Hist of pneumonia, CABG; cough w congestion x 2 days coarse breath sounds auscultated bilateral ; NAD, no increase in swelling legs from usual

## 2012-05-21 ENCOUNTER — Other Ambulatory Visit: Payer: Self-pay | Admitting: *Deleted

## 2012-05-21 ENCOUNTER — Encounter (HOSPITAL_COMMUNITY): Payer: Self-pay | Admitting: Pharmacy Technician

## 2012-05-21 DIAGNOSIS — R001 Bradycardia, unspecified: Secondary | ICD-10-CM

## 2012-05-21 MED ORDER — SODIUM CHLORIDE 0.9 % IJ SOLN: 3.0000 mL | INTRAMUSCULAR | Status: DC | PRN

## 2012-05-21 MED ORDER — GENTAMICIN SULFATE 40 MG/ML IJ SOLN
80.0000 mg | INTRAMUSCULAR | Status: DC
  Filled 2012-05-21: qty 2

## 2012-05-21 MED ORDER — CEFAZOLIN SODIUM-DEXTROSE 2-3 GM-% IV SOLR
2.0000 g | INTRAVENOUS | Status: DC
  Filled 2012-05-21 (×2): qty 50

## 2012-05-22 ENCOUNTER — Ambulatory Visit (HOSPITAL_COMMUNITY)
Admission: RE | Admit: 2012-05-22 | Discharge: 2012-05-23 | Disposition: A | Discharge status: Home / Self Care | Payer: Medicare Other | Source: Ambulatory Visit | Attending: Cardiovascular Disease | Admitting: Cardiovascular Disease

## 2012-05-22 ENCOUNTER — Encounter (HOSPITAL_COMMUNITY): Payer: Self-pay | Admitting: General Practice

## 2012-05-22 ENCOUNTER — Ambulatory Visit (HOSPITAL_COMMUNITY): Payer: Medicare Other

## 2012-05-22 ENCOUNTER — Encounter (HOSPITAL_COMMUNITY)
Admission: RE | Disposition: A | Discharge status: Home / Self Care | Payer: Self-pay | Source: Ambulatory Visit | Attending: Cardiovascular Disease

## 2012-05-22 DIAGNOSIS — E119 Type 2 diabetes mellitus without complications: Secondary | ICD-10-CM | POA: Insufficient documentation

## 2012-05-22 DIAGNOSIS — Z87891 Personal history of nicotine dependence: Secondary | ICD-10-CM | POA: Insufficient documentation

## 2012-05-22 DIAGNOSIS — J449 Chronic obstructive pulmonary disease, unspecified: Secondary | ICD-10-CM | POA: Insufficient documentation

## 2012-05-22 DIAGNOSIS — I441 Atrioventricular block, second degree: Secondary | ICD-10-CM | POA: Insufficient documentation

## 2012-05-22 DIAGNOSIS — Z950 Presence of cardiac pacemaker: Secondary | ICD-10-CM

## 2012-05-22 DIAGNOSIS — E785 Hyperlipidemia, unspecified: Secondary | ICD-10-CM | POA: Insufficient documentation

## 2012-05-22 DIAGNOSIS — J4489 Other specified chronic obstructive pulmonary disease: Secondary | ICD-10-CM | POA: Insufficient documentation

## 2012-05-22 DIAGNOSIS — R0602 Shortness of breath: Secondary | ICD-10-CM | POA: Insufficient documentation

## 2012-05-22 DIAGNOSIS — I10 Essential (primary) hypertension: Secondary | ICD-10-CM | POA: Insufficient documentation

## 2012-05-22 DIAGNOSIS — R1110 Vomiting, unspecified: Secondary | ICD-10-CM | POA: Insufficient documentation

## 2012-05-22 DIAGNOSIS — I2510 Atherosclerotic heart disease of native coronary artery without angina pectoris: Secondary | ICD-10-CM | POA: Diagnosis present

## 2012-05-22 DIAGNOSIS — I509 Heart failure, unspecified: Secondary | ICD-10-CM | POA: Insufficient documentation

## 2012-05-22 DIAGNOSIS — R001 Bradycardia, unspecified: Secondary | ICD-10-CM

## 2012-05-22 DIAGNOSIS — I498 Other specified cardiac arrhythmias: Secondary | ICD-10-CM | POA: Insufficient documentation

## 2012-05-22 HISTORY — DX: Presence of cardiac pacemaker: Z95.0

## 2012-05-22 HISTORY — DX: Atherosclerotic heart disease of native coronary artery without angina pectoris: I25.10

## 2012-05-22 HISTORY — PX: INSERT / REPLACE / REMOVE PACEMAKER: SUR710

## 2012-05-22 HISTORY — PX: PERMANENT PACEMAKER INSERTION: SHX5480

## 2012-05-22 LAB — CBC
HCT: 36.4 % — ABNORMAL LOW (ref 39.0–52.0)
Hemoglobin: 12 g/dL — ABNORMAL LOW (ref 13.0–17.0)
MCH: 27.6 pg (ref 26.0–34.0)
MCHC: 33 g/dL (ref 30.0–36.0)
RDW: 14.6 % (ref 11.5–15.5)

## 2012-05-22 LAB — GLUCOSE, CAPILLARY
Glucose-Capillary: 108 mg/dL — ABNORMAL HIGH (ref 70–99)
Glucose-Capillary: 164 mg/dL — ABNORMAL HIGH (ref 70–99)
Glucose-Capillary: 100 mg/dL — ABNORMAL HIGH (ref 70–99)

## 2012-05-22 LAB — BASIC METABOLIC PANEL
BUN: 11 mg/dL (ref 6–23)
Calcium: 9.1 mg/dL (ref 8.4–10.5)
GFR calc Af Amer: >90 mL/min (ref >90)
GFR calc non Af Amer: 83 mL/min — ABNORMAL LOW (ref >90)
Glucose, Bld: 119 mg/dL — ABNORMAL HIGH (ref 70–99)
Sodium: 139 mEq/L (ref 135–145)

## 2012-05-22 SURGERY — PERMANENT PACEMAKER INSERTION
Anesthesia: LOCAL

## 2012-05-22 MED ORDER — FENTANYL CITRATE 0.05 MG/ML IJ SOLN
INTRAMUSCULAR | Status: AC
  Filled 2012-05-22: qty 2

## 2012-05-22 MED ORDER — MIDAZOLAM HCL 5 MG/5ML IJ SOLN
INTRAMUSCULAR | Status: AC
  Filled 2012-05-22: qty 5

## 2012-05-22 MED ORDER — TRAZODONE HCL 50 MG PO TABS
50.0000 mg | ORAL_TABLET | Freq: Every evening | ORAL | Status: DC | PRN
  Administered 2012-05-22: 23:00:00 50 mg via ORAL
  Filled 2012-05-22 (×4): qty 1

## 2012-05-22 MED ORDER — ALBUTEROL SULFATE HFA 108 (90 BASE) MCG/ACT IN AERS: 1.0000 | INHALATION_SPRAY | Freq: Four times a day (QID) | RESPIRATORY_TRACT | Status: DC | PRN

## 2012-05-22 MED ORDER — LISINOPRIL 10 MG PO TABS
10.0000 mg | ORAL_TABLET | Freq: Two times a day (BID) | ORAL | Status: DC
  Administered 2012-05-23: 10 mg via ORAL
  Filled 2012-05-22 (×3): qty 1

## 2012-05-22 MED ORDER — LABETALOL HCL 5 MG/ML IV SOLN
10.0000 mg | INTRAVENOUS | Status: DC | PRN
  Administered 2012-05-22 – 2012-05-23 (×4): 10 mg via INTRAVENOUS
  Filled 2012-05-22 (×2): qty 4

## 2012-05-22 MED ORDER — LEVALBUTEROL HCL 0.63 MG/3ML IN NEBU
0.6300 mg | INHALATION_SOLUTION | Freq: Four times a day (QID) | RESPIRATORY_TRACT | Status: DC | PRN
  Administered 2012-05-22: 0.63 mg via RESPIRATORY_TRACT
  Filled 2012-05-22: qty 3

## 2012-05-22 MED ORDER — CEFAZOLIN SODIUM 1-5 GM-% IV SOLN
1.0000 g | Freq: Four times a day (QID) | INTRAVENOUS | Status: AC
  Administered 2012-05-22 – 2012-05-23 (×3): 1 g via INTRAVENOUS
  Filled 2012-05-22 (×3): qty 50

## 2012-05-22 MED ORDER — SODIUM CHLORIDE 0.9 % IV SOLN
INTRAVENOUS | Status: DC
  Administered 2012-05-22: 11:00:00 via INTRAVENOUS

## 2012-05-22 MED ORDER — HEPARIN (PORCINE) IN NACL 2-0.9 UNIT/ML-% IJ SOLN
INTRAMUSCULAR | Status: AC
  Filled 2012-05-22: qty 500

## 2012-05-22 MED ORDER — ONDANSETRON HCL 4 MG/2ML IJ SOLN
4.0000 mg | Freq: Four times a day (QID) | INTRAMUSCULAR | Status: DC | PRN
  Administered 2012-05-23: 4 mg via INTRAVENOUS
  Filled 2012-05-22: qty 2

## 2012-05-22 MED ORDER — AMLODIPINE BESYLATE 2.5 MG PO TABS
2.5000 mg | ORAL_TABLET | Freq: Every day | ORAL | Status: DC
  Administered 2012-05-23: 09:00:00 2.5 mg via ORAL
  Filled 2012-05-22 (×2): qty 1

## 2012-05-22 MED ORDER — HYDROCODONE-ACETAMINOPHEN 5-325 MG PO TABS
1.0000 | ORAL_TABLET | ORAL | Status: DC | PRN
  Administered 2012-05-22: 18:00:00 2 via ORAL
  Administered 2012-05-22 – 2012-05-23 (×2): 1 via ORAL
  Filled 2012-05-22: qty 2
  Filled 2012-05-22 (×2): qty 1

## 2012-05-22 MED ORDER — ASPIRIN EC 81 MG PO TBEC
81.0000 mg | DELAYED_RELEASE_TABLET | Freq: Every day | ORAL | Status: DC
  Administered 2012-05-23: 09:00:00 81 mg via ORAL
  Filled 2012-05-22: qty 1

## 2012-05-22 MED ORDER — METOPROLOL TARTRATE 25 MG PO TABS
25.0000 mg | ORAL_TABLET | Freq: Two times a day (BID) | ORAL | Status: DC
  Administered 2012-05-22 – 2012-05-23 (×2): 25 mg via ORAL
  Filled 2012-05-22 (×3): qty 1

## 2012-05-22 MED ORDER — LIDOCAINE HCL (PF) 1 % IJ SOLN
INTRAMUSCULAR | Status: AC
  Filled 2012-05-22: qty 60

## 2012-05-22 MED ORDER — SODIUM CHLORIDE 0.9 % IV SOLN
INTRAVENOUS | Status: AC
  Administered 2012-05-22: 16:00:00 via INTRAVENOUS

## 2012-05-22 MED ORDER — FUROSEMIDE 40 MG PO TABS: 40.0000 mg | ORAL_TABLET | Freq: Every day | ORAL | Status: DC | PRN

## 2012-05-22 MED ORDER — YOU HAVE A PACEMAKER BOOK
Freq: Once | Status: AC
  Administered 2012-05-22: 21:00:00
  Filled 2012-05-22: qty 1

## 2012-05-22 MED ORDER — INSULIN ASPART 100 UNIT/ML ~~LOC~~ SOLN
0.0000 [IU] | Freq: Three times a day (TID) | SUBCUTANEOUS | Status: DC
  Administered 2012-05-23: 09:00:00 2 [IU] via SUBCUTANEOUS

## 2012-05-22 MED ORDER — POTASSIUM CHLORIDE CRYS ER 20 MEQ PO TBCR: 20.0000 meq | EXTENDED_RELEASE_TABLET | Freq: Every day | ORAL | Status: DC | PRN

## 2012-05-22 MED ORDER — ACETAMINOPHEN 325 MG PO TABS: 325.0000 mg | ORAL_TABLET | ORAL | Status: DC | PRN

## 2012-05-22 MED ORDER — METFORMIN HCL 500 MG PO TABS
1000.0000 mg | ORAL_TABLET | Freq: Two times a day (BID) | ORAL | Status: DC
  Filled 2012-05-22: qty 2

## 2012-05-22 MED ORDER — ALPRAZOLAM 0.25 MG PO TABS
0.2500 mg | ORAL_TABLET | Freq: Every evening | ORAL | Status: DC | PRN
  Filled 2012-05-22: qty 1

## 2012-05-22 MED ORDER — ZOLPIDEM TARTRATE 5 MG PO TABS: 5.0000 mg | ORAL_TABLET | Freq: Every evening | ORAL | Status: DC | PRN

## 2012-05-22 MED ORDER — MUPIROCIN 2 % EX OINT
TOPICAL_OINTMENT | Freq: Two times a day (BID) | CUTANEOUS | Status: DC
  Administered 2012-05-22: 1 via NASAL
  Filled 2012-05-22: qty 22

## 2012-05-22 NOTE — Progress Notes (Signed)
BP up to 179/76, reportedly has had 2 doses of labetalol iv today.  HS dose of PO metoprolol given early.  Pt denies complaints.  Tele shows AV paced 60

## 2012-05-22 NOTE — H&P (Signed)
  Date of Initial H&P: 05/02/2012 , Dr. Mali Hilty  History reviewed, patient examined, no change in status, stable for surgery. After Dr. 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 Here for elective dual chamber permanent pacemaker implantation. This procedure has been fully reviewed with the patient and written informed consent has been obtained. Sanda Klein, MD, [facility] [phone] office [phone] pager 05/22/2012

## 2012-05-22 NOTE — Op Note (Signed)
Gorge, Kehr Male, 79 y.o., 10-11-1933  Location: [facility]  Bed: NONE  MRN: [id number]  CSN: [id number]  Admit Dt: 05/22/12  Procedure report  Procedure performed:  1. Implantation of new dual chamber permanent pacemaker 2. Fluoroscopy 3. Light sedation  Reason for procedure: Second degree atrioventricular block Mobitz type  II  Procedure performed by: Sanda Klein, MD  Complications: None  Estimated blood loss: <10 mL  Medications administered during procedure: Ancef 2 g intravenously Lidocaine 1% 30 mL locally,  Fentanyl 75 mcg intravenously Versed 4 mg intravenously  Device details: Generator Medtronic Adapt model ADDRL1 serial number [id number] H Right atrial lead Medtronic [id number] serial number [id number] Right ventricular lead Medtronic [id number] serial number [id number]  Procedure details:  After the risks and benefits of the procedure were discussed the patient provided informed consent and was brought to the cardiac cath lab in the fasting state. The patient was prepped and draped in usual sterile fashion. Local anesthesia with 1% lidocaine was administered to to the left infraclavicular area. A 5-6 cm horizontal incision was made parallel with and 2-3 cm caudal to the left clavicle. Using electrocautery and blunt dissection a prepectoral pocket was created down to the level of the pectoralis major muscle fascia. The pocket was carefully inspected for hemostasis. An antibiotic-soaked sponge was placed in the pocket.  Under fluoroscopic guidance and using the modified Seldinger technique 2 separate venipunctures were performed to access the left subclavian vein. No difficulty was encountered accessing the vein.  Two J-tip guidewires were subsequently exchanged for two 7 French safe sheaths.  Under fluoroscopic guidance the ventricular lead was advanced to level of the mid to apical right ventricular septum and thet active-fixation helix was deployed. Prominent  current of injury was seen. Satisfactory pacing and sensing parameters were recorded. There was no evidence of diaphragmatic stimulation at maximum device output. The safe sheath was peeled away and the lead was secured in place with 2-0 silk.  In similar fashion the right atrial lead was advanced to the level of the atrial appendage. The active-fixation helix was deployed. There was prominent current of injury. Satisfactory  pacing and sensing parameters were recorded. There was no evidence of diaphragmatic stimulation with pacing at maximum device output. The safe sheath was peeled away and the lead was secured in place with 2-0 silk.  The antibiotic-soaked sponge was removed from the pocket. The pocket was flushed with copious amounts of antibiotic solution. Reinspection showed excellent hemostasis..  The ventricular lead was connected to the generator and appropriate ventricular pacing was seen. Subsequently the atrial lead was also connected. Repeat testing of the lead parameters later showed excellent values.  The entire system was then carefully inserted in the pocket with care been taking that the leads and device assumed a comfortable position without pressure on the incision. Great care was taken that the leads be located deep to the generator. The pocket was then closed in layers using 2 layers of 2-0 Vicryl and cutaneous staples, after which a sterile dressing was applied.  At the end of the procedure the following lead parameters were encountered:  Right atrial lead  sensed P waves 1.8-2.2 mV, impedance 537 ohms, threshold 0.8 V at 0.5 ms pulse width.  Right ventricular lead sensed R waves 8-10 mV, impedance 1207ohms, threshold 1 V at 0.5 ms pulse width.   Sanda Klein, MD, [place] [phone] office [phone] pager 05/22/2012 3:20 PM

## 2012-05-23 ENCOUNTER — Ambulatory Visit (HOSPITAL_COMMUNITY): Payer: Medicare Other

## 2012-05-23 DIAGNOSIS — Z950 Presence of cardiac pacemaker: Secondary | ICD-10-CM

## 2012-05-23 LAB — GLUCOSE, CAPILLARY
Glucose-Capillary: 138 mg/dL — ABNORMAL HIGH (ref 70–99)
Glucose-Capillary: 95 mg/dL (ref 70–99)

## 2012-05-23 MED ORDER — HYDRALAZINE HCL 20 MG/ML IJ SOLN
10.0000 mg | Freq: Once | INTRAMUSCULAR | Status: AC
  Administered 2012-05-23: 10 mg via INTRAVENOUS
  Filled 2012-05-23: qty 1

## 2012-05-23 MED ORDER — METOPROLOL TARTRATE 25 MG PO TABS: 25.0000 mg | ORAL_TABLET | Freq: Two times a day (BID) | ORAL | Status: DC

## 2012-05-23 MED ORDER — LEVALBUTEROL HCL 0.63 MG/3ML IN NEBU
0.6300 mg | INHALATION_SOLUTION | Freq: Once | RESPIRATORY_TRACT | Status: AC
  Administered 2012-05-23: 13:00:00 0.63 mg via RESPIRATORY_TRACT
  Filled 2012-05-23: qty 3

## 2012-05-23 MED ORDER — HYDROCODONE-ACETAMINOPHEN 5-325 MG PO TABS: 1.0000 | ORAL_TABLET | ORAL | Status: DC | PRN

## 2012-05-23 NOTE — Progress Notes (Signed)
Pt called RN with sudden episode of vomiting, green liquid on floor. IV zofran given.  No neuro deficits noted.  Cecilie Kicks NP informed BP 183/79 and has had very little response to iv labetalol.  Order received, hydralazine ordered from pharmacy.  Moderate SOB w/ getting up out of bed, have been unable to wean O2 below 3L due to sat drops down to 88% on 2L.  Pt states has not taken lasix in 2 days. CXR ordered for this am. Bed exit alarm on, pt denies complaints at this time.  Will continue to monitor closely.

## 2012-05-23 NOTE — H&P (Signed)
Jesus Oconnor is an 79 y.o. male.   Chief Complaint:  Pacemaker implant HPI:   The patient is a 79 year old gentleman with a history of 5-vessel CABG complicated by atrial flutter. He was on Coumadin, converted to sinus rhythm.  Also, he had postoperative cholecystitis and, fortunately, has not had any recurrence of his atrial fibrillation. His bypass was in July 2012.  Back in Nov 2013 he reported low energy, which is stable and not worsened. He did report getting short of breath with some activities, including blowing or raking the leaves.  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 In enquiring about symptoms he reports he feels very similar if not slightly better than he did prior to his gallbladder surgery.  HR monitor confirmed second degree AVB.  He presented for implant of a dual-chamber PPM.    He reports some vomiting, right LEE and mild SOB this AM but denies CP, abd pain, orthopnea, fever.      Past Medical History  Diagnosis Date  . HTN (hypertension)   . Dyslipidemia   . DM2 (diabetes mellitus, type 2)   . COPD (chronic obstructive pulmonary disease)   . CHF (congestive heart failure)   . Hyperlipidemia   . Sore throat   . Cough   . Wheezing   . Leg swelling   . Constipation   . Diarrhea   . Coronary artery disease   . Atrial flutter     severe bradycardia  . Shortness of breath   . Pacemaker 05/22/2012    Dr  Sallyanne Kuster     Dual Chamber     Past Surgical History  Procedure Laterality Date  . Cardiac catheterization    . Cabg x5  09/13/2010    Dr Cyndia Bent  . Repair of umbilical hernia.    . Repair of left indirect inguinal hernia with mesh.    . Cholecystectomy  october 2012  . Coronary artery bypass graft    . Insert / replace / remove pacemaker  05/22/2012     dual chamber    Family History  Problem Relation Age of Onset  . Heart disease Father   . Heart disease Brother     s/p cabg  . Heart  disease Maternal Uncle    Social History:  reports that he quit smoking about 21 months ago. His smoking use included Cigarettes. He smoked 0.00 packs per day for 40 years. He has never used smokeless tobacco. He reports that  drinks alcohol. He reports that he does not use illicit drugs.  Allergies: No Known Allergies  Medications Prior to Admission  Medication Sig Dispense Refill  . albuterol (PROVENTIL HFA;VENTOLIN HFA) 108 (90 BASE) MCG/ACT inhaler Inhale 1-2 puffs into the lungs every 6 (six) hours as needed for wheezing.  1 Inhaler  0  . ALPRAZolam (XANAX) 0.25 MG tablet Take 0.25-0.5 mg by mouth at bedtime as needed for sleep.      Marland Kitchen amLODipine (NORVASC) 2.5 MG tablet Take 2.5 mg by mouth daily.        Marland Kitchen aspirin 81 MG tablet Take 81 mg by mouth daily.        . furosemide (LASIX) 40 MG tablet Take 40 mg by mouth daily as needed (for swelling).       Marland Kitchen lisinopril (PRINIVIL,ZESTRIL) 10 MG tablet Take 10 mg by mouth 2 (two) times daily.       . metFORMIN (GLUCOPHAGE) 1000  MG tablet Take 1,000 mg by mouth 2 (two) times daily with a meal.        . potassium chloride SA (K-DUR,KLOR-CON) 20 MEQ tablet Take 20 mEq by mouth daily as needed (taken with furosemide).      . pravastatin (PRAVACHOL) 80 MG tablet Take 80 mg by mouth daily.      . traZODone (DESYREL) 50 MG tablet Take 50-100 mg by mouth at bedtime as needed for sleep.        Results for orders placed during the hospital encounter of 05/22/12 (from the past 48 hour(s))  SURGICAL PCR SCREEN     Status: None   Collection Time    05/22/12 10:57 AM      Result Value Range   MRSA, PCR NEGATIVE  NEGATIVE   Staphylococcus aureus NEGATIVE  NEGATIVE   Comment:            The Xpert SA Assay (FDA     approved for NASAL specimens     in patients over 24 years of age),     is one component of     a comprehensive surveillance     program.  Test performance has     been validated by [employer] for patients greater     than or equal  to 30 year old.     It is not intended     to diagnose infection nor to     guide or monitor treatment.  CBC     Status: Abnormal   Collection Time    05/22/12 10:58 AM      Result Value Range   WBC 5.8  4.0 - 10.5 K/uL   RBC 4.35  4.22 - 5.81 MIL/uL   Hemoglobin 12.0 (*) 13.0 - 17.0 g/dL   HCT 36.4 (*) 39.0 - 52.0 %   MCV 83.7  78.0 - 100.0 fL   MCH 27.6  26.0 - 34.0 pg   MCHC 33.0  30.0 - 36.0 g/dL   RDW 14.6  11.5 - 15.5 %   Platelets 180  150 - 400 K/uL  BASIC METABOLIC PANEL     Status: Abnormal   Collection Time    05/22/12 10:58 AM      Result Value Range   Sodium 139  135 - 145 mEq/L   Potassium 3.9  3.5 - 5.1 mEq/L   Chloride 101  96 - 112 mEq/L   CO2 26  19 - 32 mEq/L   Glucose, Bld 119 (*) 70 - 99 mg/dL   BUN 11  6 - 23 mg/dL   Creatinine, Ser 0.84  0.50 - 1.35 mg/dL   Calcium 9.1  8.4 - 10.5 mg/dL   GFR calc non Af Amer 83 (*) >90 mL/min   GFR calc Af Amer >90  >90 mL/min   Comment:            The eGFR has been calculated     using the CKD EPI equation.     This calculation has not been     validated in all clinical     situations.     eGFR's persistently     <90 mL/min signify     possible Chronic Kidney Disease.  PROTIME-INR     Status: None   Collection Time    05/22/12 10:58 AM      Result Value Range   Prothrombin Time 13.7  11.6 - 15.2 seconds   INR 1.06  0.00 - 1.49  GLUCOSE, CAPILLARY     Status: Abnormal   Collection Time    05/22/12  3:42 PM      Result Value Range   Glucose-Capillary 108 (*) 70 - 99 mg/dL  GLUCOSE, CAPILLARY     Status: Abnormal   Collection Time    05/22/12  5:20 PM      Result Value Range   Glucose-Capillary 164 (*) 70 - 99 mg/dL  GLUCOSE, CAPILLARY     Status: Abnormal   Collection Time    05/22/12 10:00 PM      Result Value Range   Glucose-Capillary 100 (*) 70 - 99 mg/dL   Comment 1 Notify RN     Comment 2 Documented in Chart    GLUCOSE, CAPILLARY     Status: Abnormal   Collection Time    05/23/12  8:37 AM       Result Value Range   Glucose-Capillary 138 (*) 70 - 99 mg/dL   Dg Chest 2 View  05/23/2012  *RADIOLOGY REPORT*  Clinical Data: Post pacemaker insertion  CHEST - 2 VIEW  Comparison: Chest x-ray of 05/22/2012  Findings: A dual lead permanent pacemaker is now present.  No pneumothorax is seen.  There are bilateral pleural effusions present left greater than right with cardiomegaly and mild pulmonary vascular congestion consistent with mild congestive heart failure.  Median sternotomy sutures are noted.  There are degenerative changes throughout the thoracic spine.  IMPRESSION:  1.  Dual lead permanent pacemaker is present.  No pneumothorax. 2.  Some increase in CHF with pulmonary vascular congestion, cardiomegaly, and bilateral effusions.   Original Report Authenticated By: Ivar Drape, M.D.    Dg Chest 2 View  05/22/2012  *RADIOLOGY REPORT*  Clinical Data: Pre pacer insertion.  CHEST - 2 VIEW  Comparison: Two-view chest 04/19/2011  Findings: Mild cardiac enlargement is evident.  Coronary artery calcifications are evident.  The patient is status post median sternotomy for CABG.  A left pleural effusion and airspace disease has progressed.  Mild pulmonary vascular congestion is evident.  No significant airspace disease is present on the right.  The visualized soft tissues and bony thorax are unremarkable.  IMPRESSION:  1.  Cardiomegaly and mild pulmonary vascular congestion. 2.  Progressive left lower lobe airspace disease.  Although this may be atelectasis associated with the left pleural effusion, infection is not excluded.   Original Report Authenticated By: San Morelle, M.D.     Review of Systems  Constitutional: Negative for fever and diaphoresis.  HENT: Negative for congestion.   Respiratory: Positive for shortness of breath. Negative for cough.   Cardiovascular: Positive for leg swelling. Negative for chest pain and orthopnea.  Gastrointestinal: Positive for vomiting. Negative for  abdominal pain, diarrhea and constipation.  Genitourinary: Negative for dysuria.    Blood pressure 138/58, pulse 59, temperature 98.2 F (36.8 C), temperature source Oral, resp. rate 18, height 5' 11.5" (1.816 m), weight 99.2 kg (218 lb 11.1 oz), SpO2 96.00%. Physical Exam  Constitutional: He is oriented to person, place, and time. He appears well-developed and well-nourished. No distress.  HENT:  Head: Normocephalic and atraumatic.  Eyes: EOM are normal. Pupils are equal, round, and reactive to light. No scleral icterus.  Neck: Normal range of motion.  Cardiovascular: Normal rate, regular rhythm, S1 normal and S2 normal.   No murmur heard. Pulses:      Radial pulses are 2+ on the right side, and 2+ on the left side.  Dorsalis pedis pulses are 1+ on the right side, and 1+ on the left side.  Respiratory: Effort normal. He has wheezes (left ).  Musculoskeletal: He exhibits edema (right LE).  Neurological: He is alert and oriented to person, place, and time. He exhibits normal muscle tone.  Skin: Skin is warm and dry.  Psychiatric: He has a normal mood and affect.     Assessment/Plan Principal Problem:   Pacemaker Active Problems:   HTN (hypertension)   Dyslipidemia   DM2 (diabetes mellitus, type 2)   CAD (coronary artery disease)   Plan:   SP PPM implant for second degree AVB.    Generator Medtronic Adapt model ADDRL1 serial number [id number]  Right atrial lead Medtronic [id number] serial number [id number]  Right ventricular lead Medtronic [id number] serial number [id number]  No complications seen on CXR.  BP and HR stable.  Will give xopenex nebs prior to DC.  HAGER, BRYAN 05/23/2012, 11:50 AM   Patient seen and examined. Agree with assessment and plan. S/P pacemaker. Mild pulmonary vascular congestion on CXR; no pneumothorax. DC today.   Troy Sine, MD, [facility] 05/23/2012 12:49 PM

## 2012-05-23 NOTE — Progress Notes (Signed)
Utilization Review Completed Chayne Baumgart J. Rylinn Linzy, RN, BSN, NCM [phone]  

## 2012-05-23 NOTE — Progress Notes (Signed)
BP 182/85.  Tele av paced 60.  Pt wide awake, states slept a little since receiving trazadone.  IV Labetalol and PO xanax given.  Pt denies pain, LUC site D&I.

## 2012-07-15 ENCOUNTER — Encounter: Payer: Self-pay | Admitting: *Deleted

## 2012-07-19 ENCOUNTER — Telehealth: Payer: Self-pay | Admitting: Internal Medicine

## 2012-07-19 NOTE — Telephone Encounter (Signed)
Returned call.  Left message that I will follow-up with pt and he should be checking his weight daily.

## 2012-07-19 NOTE — Telephone Encounter (Signed)
Paper chart requested.

## 2012-07-19 NOTE — Telephone Encounter (Signed)
He is in CHF program-pt does not want to weigh everyday! Says the doctor didn't tell him to do this-her question is does he need to weigh everyday?

## 2012-07-19 NOTE — Telephone Encounter (Signed)
Call to pt.  Pt stated his daughter talked to the nurse this morning and she told him to send the scale he has back and they are going to send another one.  Pt denied daily weights and stated he takes his fluid pill as needed.  Pt informed he should be checking his weight daily to monitor fluid build-up.  Pt also informed I would talk with the nurse who called to find out about the scale.  Pt verbalized understanding and agreed w/ plan.

## 2012-07-20 NOTE — Telephone Encounter (Signed)
Call to [facility] and left message to call back.  No dx of CHF in pt's record.  Will await return call to discuss further.

## 2012-07-20 NOTE — Telephone Encounter (Signed)
Call to pt and informed per Dr. Debara Pickett.  Asked pt if he wanted me to contact his daughter and pt declined.  Stated he will tell her.  Advised he have her call the office if she has any questions.  Pt verbalized understanding and agreed w/ plan.  Call to [facility] and left message on confidential voicemail with information per Dr. Debara Pickett.

## 2012-07-20 NOTE — Telephone Encounter (Signed)
Please notify him that he has "diastolic heart failure" .Marland Kitchen His heart squeezes normally (EF 55%), but does not relax normally.  He can still build up fluid with excess salt intake or for other reasons. It is a good idea to monitor his weight daily for changes. If he has 3-4 lb weight gain over a couple of days, that is likely fluid weight and he should notify us so that we may need to adjust his medications.  Thanks.  -Dr. Debara Pickett

## 2012-07-20 NOTE — Telephone Encounter (Signed)
Call to pt and pt informed of conversation and that Dr. Debara Pickett is being notified for further instructions.  Pt will be notified when a response received.  Pt verbalized understanding and agreed w/ plan.

## 2012-07-20 NOTE — Telephone Encounter (Signed)
Heather returned call.  Stated pt has been in their CHF program since last year.  Informed pt does not have a dx of CHF.  Nira Conn wanted to know pt's EF from last echo and informed 55-60% per echo in July 2012.  Also stated she did talk w/ pt and daughter yesterday about scale and pt is to send current scale back and they are sending a new one.  Wanted to know if Dr. Debara Pickett wants pt to weigh daily or not.  Informed Dr. Debara Pickett will be notified and her call will be returned once a response is given.  Verbalized understanding and agreed w/ plan.

## 2012-08-27 ENCOUNTER — Telehealth: Payer: Self-pay | Admitting: Internal Medicine

## 2012-08-27 NOTE — Telephone Encounter (Signed)
Message forwarded to S. Tye Savoy, CMA r/t device/monitor concerns

## 2012-08-27 NOTE — Telephone Encounter (Signed)
Please call-question about his pacemaker-she doesn't think she came when he had  His last appt-also need to talk to you about  Him going  To court!

## 2012-08-27 NOTE — Telephone Encounter (Signed)
Spoke to [facility] (daughter) regarding Jesus Oconnor upcoming [facility] appointment, and how to use the [employer] monitor. Jesus Oconnor voiced understanding. Jesus Oconnor also mentioned that her father received a traffic ticket for not wearing his seatbelt a few weeks after having his pacemaker implanted. She asked if there was a letter we could give him as an excuse for not wearing his seatbelt. I informed Jesus Oconnor that we would not be able to give him a letter for that, because we always advise people to wear their seatbelt even after a ppm insertion. Jesus Oconnor, again, voiced her understanding.

## 2012-08-27 NOTE — Telephone Encounter (Signed)
Per Medical Records staff, still locating paper chart.

## 2012-08-28 ENCOUNTER — Encounter: Payer: Self-pay | Admitting: Internal Medicine

## 2012-09-21 IMAGING — CR DG CHEST 2V
2 series · 2 of 2 positions shown · non-contrast
Comparison: 09/29/2009

CLINICAL DATA: Chest pain.

CHEST - 2 VIEW

[w chest pa]
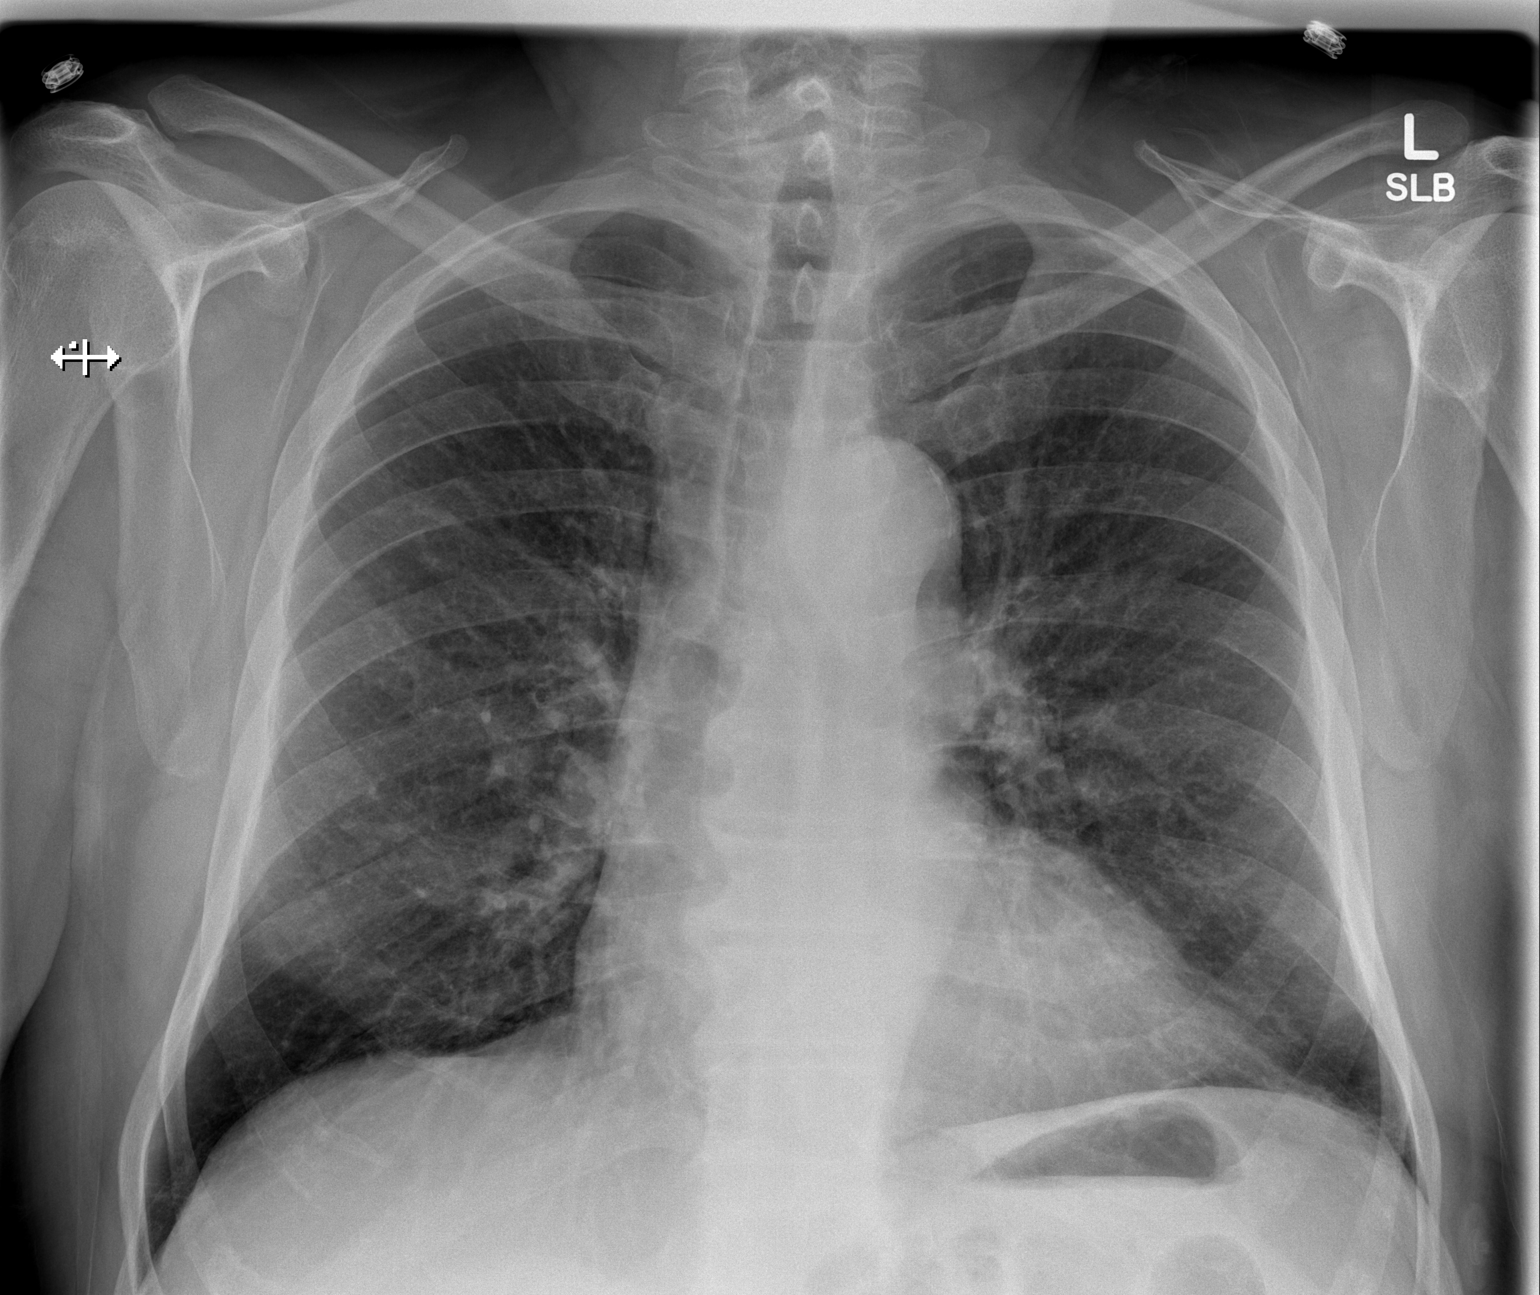

[w chest lat]
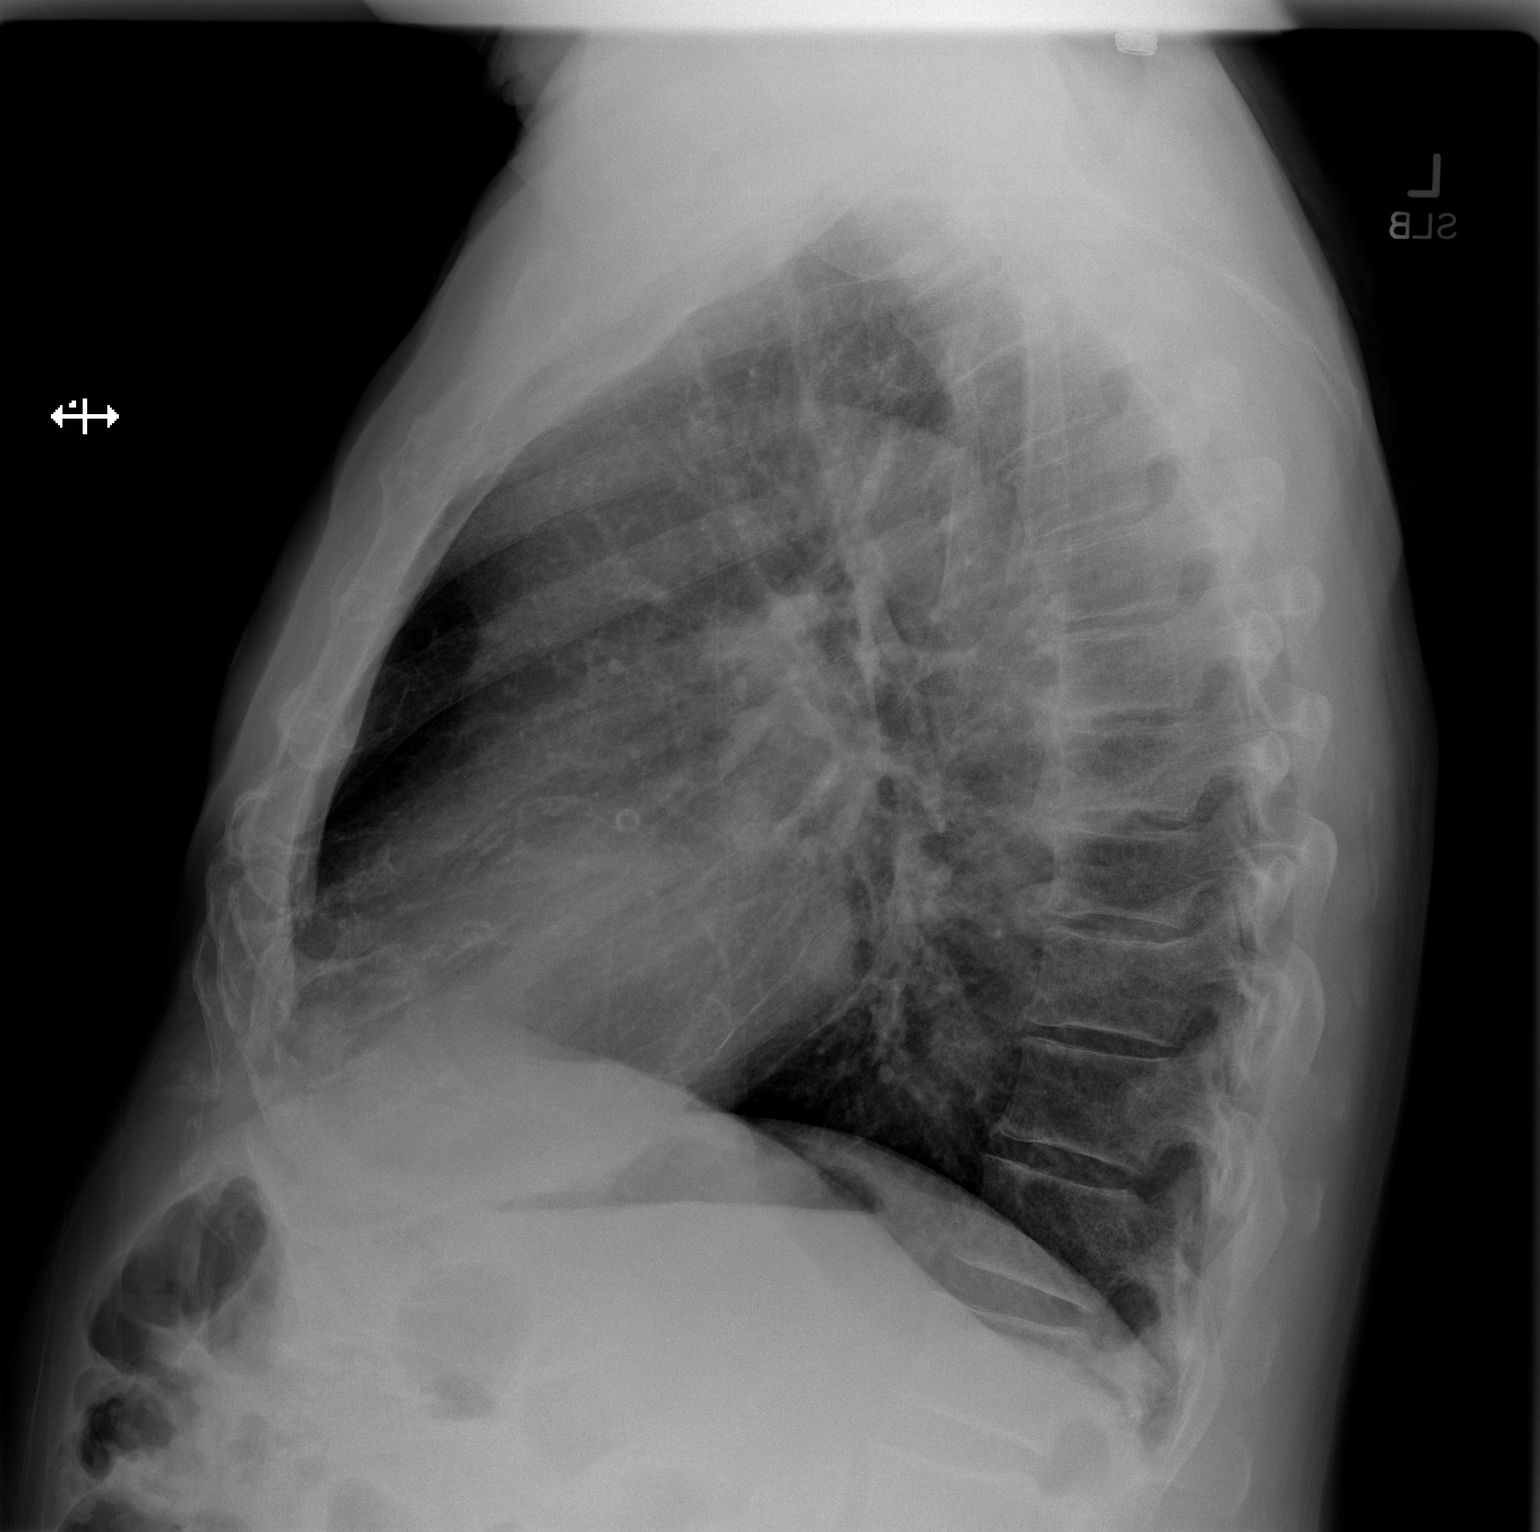

[2 of 2 positions shown; findings below may reference images not displayed]

FINDINGS: There is peribronchial thickening, similar to prior
study. Heart and mediastinal contours are within normal limits.  No
focal opacities or effusions.  No acute bony abnormality.
IMPRESSION: Chronic bronchitic changes.  On

## 2012-09-26 ENCOUNTER — Other Ambulatory Visit: Payer: Self-pay | Admitting: Cardiovascular Disease

## 2012-09-26 DIAGNOSIS — I498 Other specified cardiac arrhythmias: Secondary | ICD-10-CM

## 2012-09-26 DIAGNOSIS — I441 Atrioventricular block, second degree: Secondary | ICD-10-CM

## 2012-10-02 ENCOUNTER — Encounter: Payer: Self-pay | Admitting: *Deleted

## 2012-10-02 LAB — REMOTE PACEMAKER DEVICE
AL AMPLITUDE: 2.8 mv
AL THRESHOLD: 0.625 V
BAMS-0001: 150 {beats}/min
RV LEAD IMPEDENCE PM: 965 Ohm
RV LEAD THRESHOLD: 0.5 V

## 2012-10-24 IMAGING — CR DG CHEST 2V
2 series · 2 of 2 positions shown · non-contrast
Comparison: Portable chest x-ray of 09/15/2010

CLINICAL DATA: Status post CABG, follow

CHEST - 2 VIEW

[w chest pa]
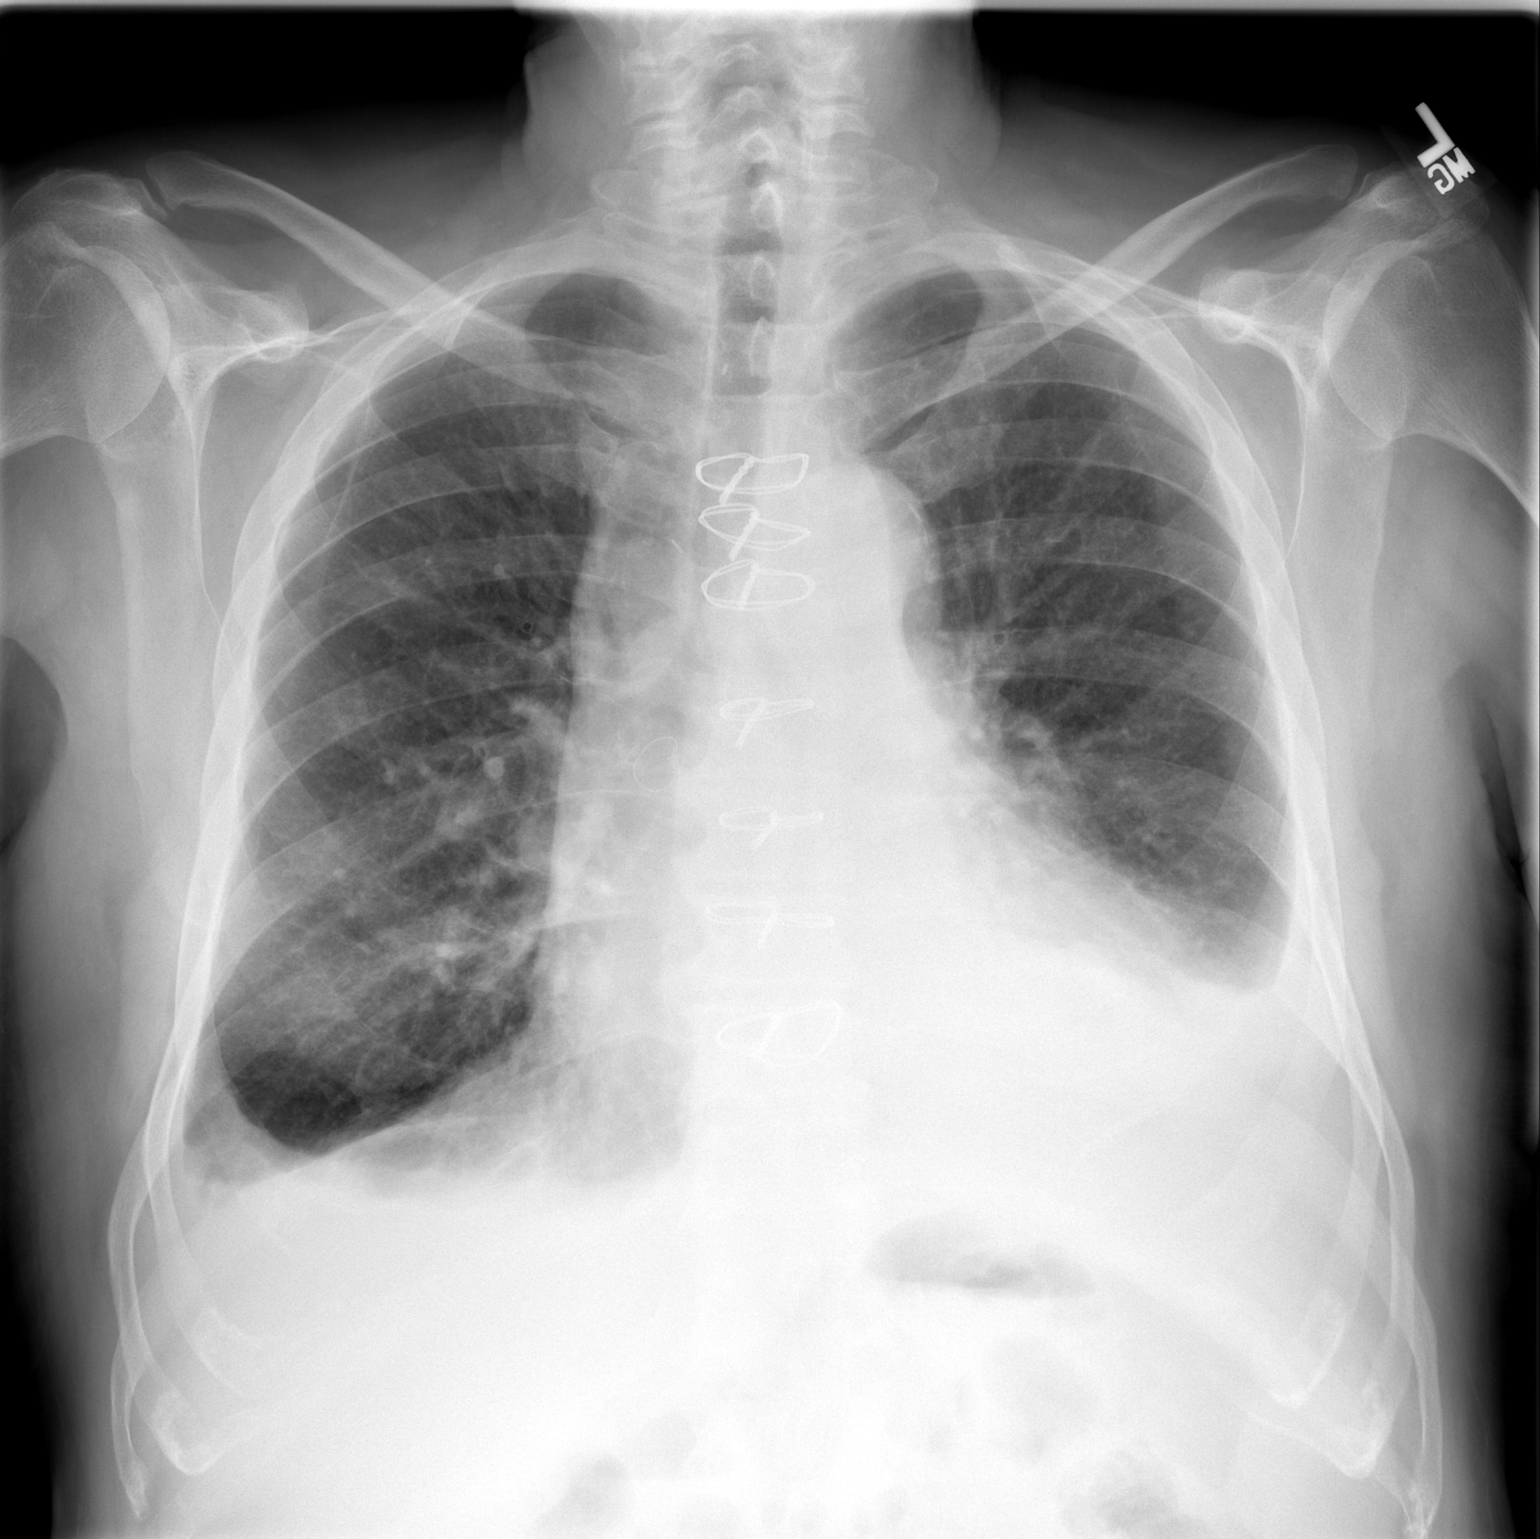

[w chest lat]
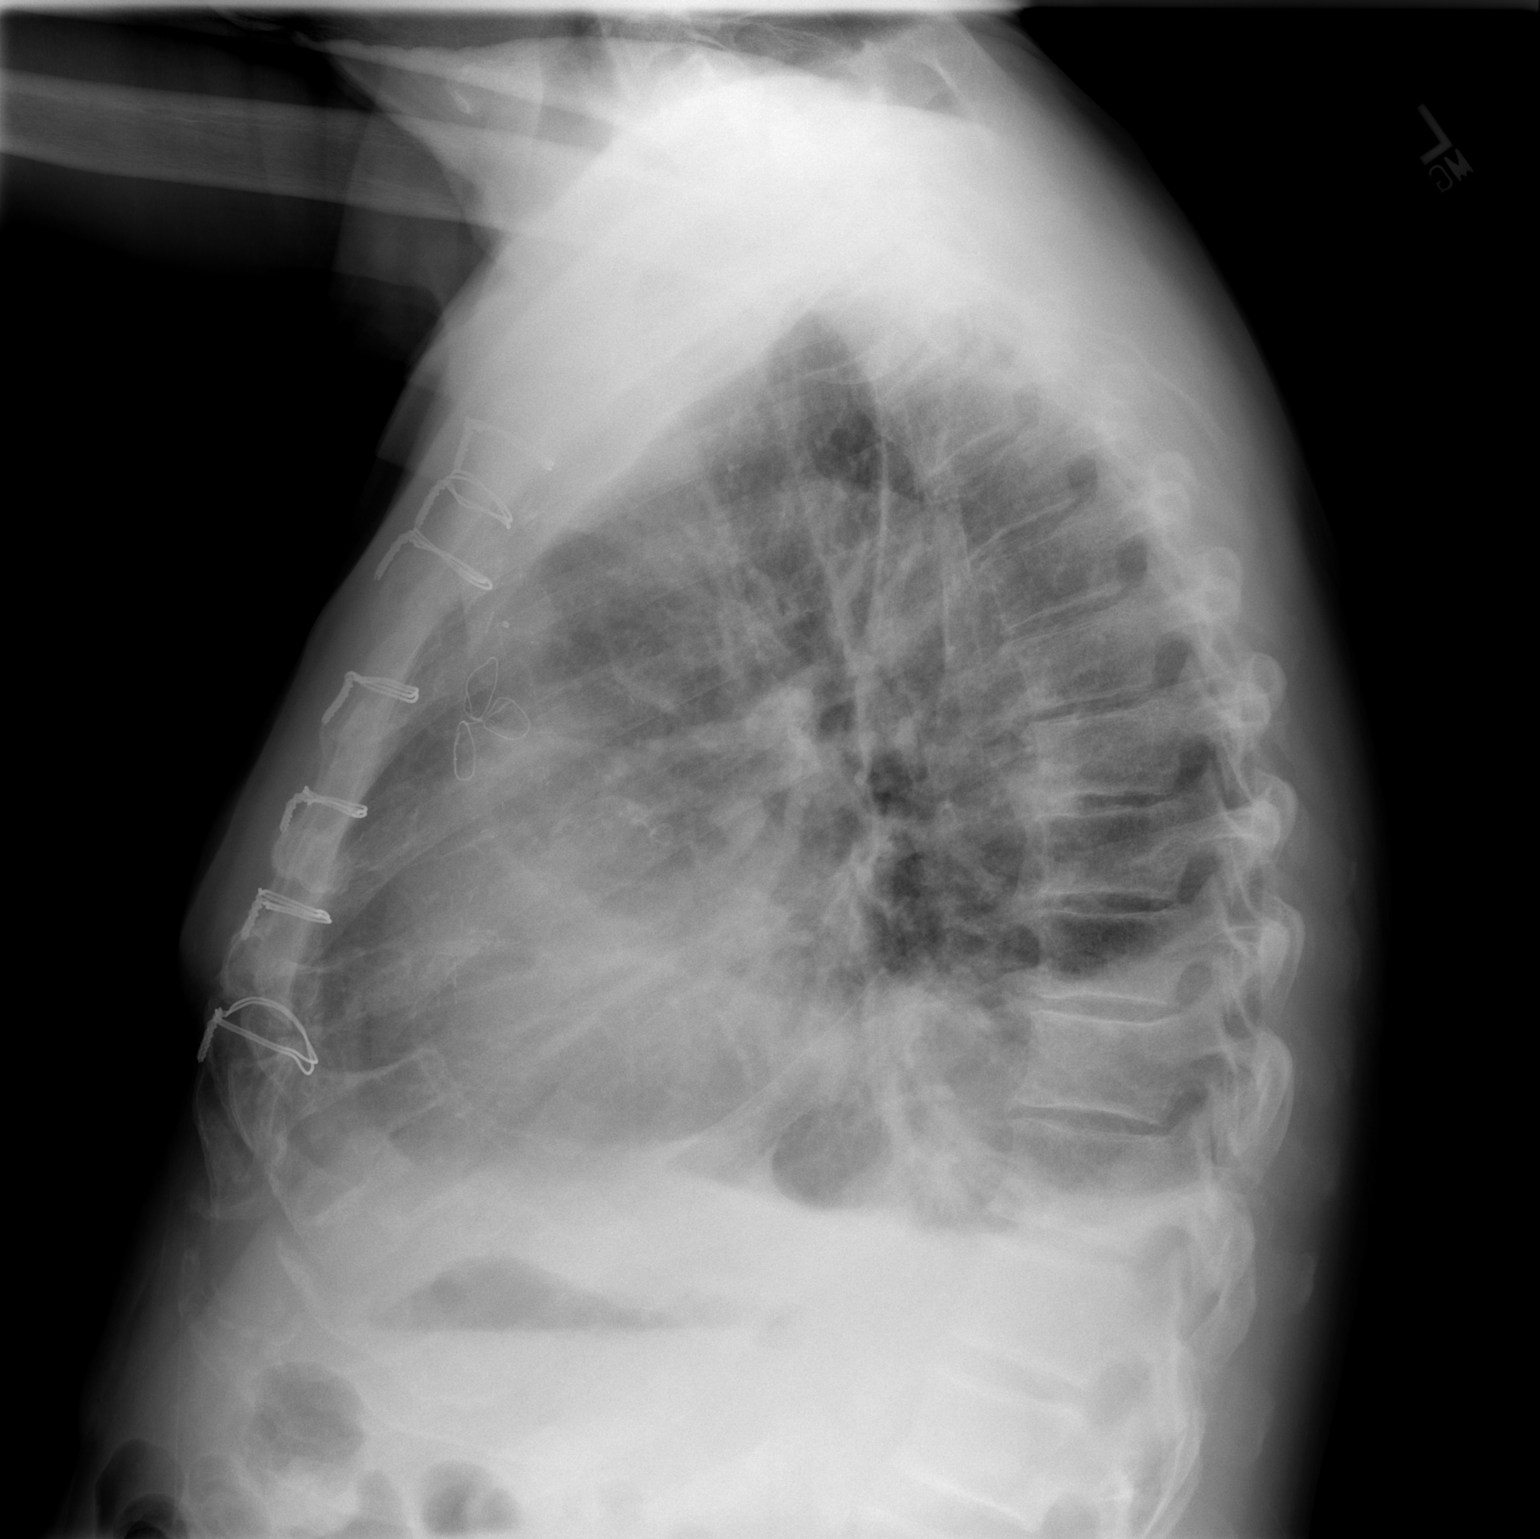

[2 of 2 positions shown; findings below may reference images not displayed]

FINDINGS: Aeration of the lungs has improved.  There are bilateral
pleural effusions remaining left larger than right with basilar
atelectasis left greater than right.  Cardiomegaly is stable.
Median sternotomy sutures are noted from prior CABG.
IMPRESSION: Improved aeration.  Bilateral pleural effusions remain with basilar
atelectasis, left greater than right.

## 2012-11-26 ENCOUNTER — Other Ambulatory Visit: Payer: Self-pay | Admitting: *Deleted

## 2012-11-26 MED ORDER — AMLODIPINE BESYLATE 5 MG PO TABS: 5.0000 mg | ORAL_TABLET | Freq: Every day | ORAL | Status: DC

## 2012-12-13 ENCOUNTER — Other Ambulatory Visit: Payer: Self-pay | Admitting: Physician Assistant

## 2012-12-14 NOTE — Telephone Encounter (Signed)
Rx was sent to pharmacy electronically. 

## 2012-12-19 ENCOUNTER — Encounter: Payer: Self-pay | Admitting: Cardiovascular Disease

## 2012-12-27 ENCOUNTER — Ambulatory Visit (INDEPENDENT_AMBULATORY_CARE_PROVIDER_SITE_OTHER): Payer: Medicare Other | Admitting: Internal Medicine

## 2012-12-27 ENCOUNTER — Encounter: Payer: Self-pay | Admitting: Internal Medicine

## 2012-12-27 VITALS — BP 164/80 | HR 64 | Ht 71.0 in | Wt 211.6 lb

## 2012-12-27 DIAGNOSIS — I10 Essential (primary) hypertension: Secondary | ICD-10-CM

## 2012-12-27 DIAGNOSIS — M25569 Pain in unspecified knee: Secondary | ICD-10-CM

## 2012-12-27 DIAGNOSIS — E785 Hyperlipidemia, unspecified: Secondary | ICD-10-CM

## 2012-12-27 DIAGNOSIS — I4892 Unspecified atrial flutter: Secondary | ICD-10-CM

## 2012-12-27 DIAGNOSIS — I2510 Atherosclerotic heart disease of native coronary artery without angina pectoris: Secondary | ICD-10-CM

## 2012-12-27 DIAGNOSIS — M25561 Pain in right knee: Secondary | ICD-10-CM

## 2012-12-27 DIAGNOSIS — Z950 Presence of cardiac pacemaker: Secondary | ICD-10-CM

## 2012-12-27 NOTE — Patient Instructions (Addendum)
Your physician wants you to follow-up in: 6 months You will receive a reminder letter in the mail two months in advance. If you don't receive a letter, please call our office to schedule the follow-up appointment.  You have been referred to Dr. Onnie Graham at [facility].

## 2012-12-27 NOTE — Progress Notes (Signed)
OFFICE NOTE  Chief Complaint:  Routine follow-up  Primary Care Physician: Milagros Evener, MD  HPI:  Jesus Oconnor is a 79 year old gentleman who we have been following closely with a history of 5-vessel CABG and atrial flutter in 2012. He has done well with lack of chest pain; however, he says he does have low energy which has been pretty much stable, not necessarily improved after surgery. He also has significant abnormalities in his EKG as well indicating a conduction delay. When his episode of atrial flutter was occurring his ventricular response was only in the 40s. He was also noted on EKG to have trifascicular block and periods of Wenckebach. Today, he actually had some more Wenckebach and couplets as well as continued trifascicular block. He has had paroxysmal A-flutter and we have kept him off a beta blocker for those reasons. I placed him on a monitor which demonstrated significant bradycardia for which she was symptomatic and ultimately underwent a permanent pacemaker. He reports that this has helped him significantly and recently followed up with Dr. Loletha Grayer in the office. The pacemaker was interrogated and apparently is working properly. Jesus Oconnor has no significant complaints other than some right knee pain and itchiness and tenderness along the lower aspect of his midline sternal incision, where he has a keloid scar.  Of note he recently had acute cholecystitis and is status post cholecystectomy.  PMHx:  Past Medical History  Diagnosis Date  . HTN (hypertension)   . Dyslipidemia   . DM2 (diabetes mellitus, type 2)   . COPD (chronic obstructive pulmonary disease)   . CHF (congestive heart failure)   . Hyperlipidemia   . Sore throat   . Cough   . Wheezing   . Leg swelling   . Constipation   . Diarrhea   . Coronary artery disease   . Atrial flutter     severe bradycardia  . Shortness of breath   . Pacemaker 05/22/2012    Dr  Croitoru    Medtronic Adapta  . Mobitz  type 1 second degree atrioventricular block   . 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 SVG to firts & second diagonals,SVG to forth posterolateral branch to RCA  . Diastolic dysfunction     Echo 0000000 I diastolic dysfunction,EF 0000000    Past Surgical History  Procedure Laterality Date  . Cardiac catheterization  09/10/2010    severe LAD,D1,D2 & ostial D2  . Cabg x5  09/13/2010    Dr Farrel Conners to LAD,SVG to obtuse marginal,seq. SVG to first & second diagonals,SVG to fourth posterolateral branch RCA  . Repair of umbilical hernia.    . Repair of left indirect inguinal hernia with mesh.    . Cholecystectomy  october 2012  . Coronary artery bypass graft    . Insert / replace / remove pacemaker  05/22/2012     dual chamber    FAMHx:  Family History  Problem Relation Age of Onset  . Heart disease Father   . Heart disease Brother     s/p cabg  . Heart disease Maternal Uncle     SOCHx:   reports that he quit smoking about 2 years ago. His smoking use included Cigarettes. He smoked 0.00 packs per day for 40 years. He has never used smokeless tobacco. He reports that he drinks alcohol. He reports that he does not use illicit drugs.  ALLERGIES:  No Known Allergies  ROS: A comprehensive review of  systems was negative except for: Cardiovascular: positive for palpitations Integument/breast: positive for skin lesion(s) Musculoskeletal: positive for knee pain  HOME MEDS: Current Outpatient Prescriptions  Medication Sig Dispense Refill  . amLODipine (NORVASC) 5 MG tablet Take 1 tablet (5 mg total) by mouth daily.  30 tablet  6  . aspirin 81 MG tablet Take 81 mg by mouth daily.        . furosemide (LASIX) 40 MG tablet Take 40 mg by mouth daily as needed (for swelling).       Marland Kitchen lisinopril (PRINIVIL,ZESTRIL) 10 MG tablet Take 10 mg by mouth 2 (two) times daily.       . metFORMIN (GLUCOPHAGE) 1000 MG tablet Take 500 mg by mouth 2 (two) times daily with a  meal.       . metoprolol tartrate (LOPRESSOR) 25 MG tablet TAKE 1 TABLET BY MOUTH TWICE DAILY  60 tablet  6  . potassium chloride SA (K-DUR,KLOR-CON) 20 MEQ tablet Take 20 mEq by mouth daily as needed (taken with furosemide).      . traZODone (DESYREL) 50 MG tablet Take 50-100 mg by mouth at bedtime as needed for sleep.       No current facility-administered medications for this visit.    LABS/IMAGING: No results found for this or any previous visit (from the past 48 hour(s)). No results found.  VITALS: BP 164/80  Pulse 64  Ht 5\' 11"  (1.803 m)  Wt 211 lb 9.6 oz (95.981 kg)  BMI 29.53 kg/m2  EXAM: General appearance: alert and no distress Neck: no carotid bruit and no JVD Lungs: clear to auscultation bilaterally Heart: regular rate and rhythm, S1, S2 normal, no murmur, click, rub or gallop, the pacemaker site is without fluctuance, but is located high in the left shoulder area Abdomen: soft, non-tender; bowel sounds normal; no masses,  no organomegaly Extremities: extremities normal, atraumatic, no cyanosis or edema Pulses: 2+ and symmetric Skin: Skin color, texture, turgor normal. No rashes or lesions or 3" keloid scar at the inferior aspect of his midline incision Neurologic: Grossly normal Psych: Mood, affect normal  EKG: AV dual chamber paced rhythm at 64  ASSESSMENT: 1. 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 Short episode of paroxysmal atrial fibrillation in the setting of acute cholecystitis, without significant recurrence on aspirin 3. Hypertension-controlled 4. Dyslipidemia-at goal  PLAN: 1.   Jesus Oconnor is doing well denies any chest pain or palpitations. I've encouraged him to get more exercise but is limited by his right knee which is giving him problems. He desired a referral to see a specialist and I recommended Dr. Onnie Graham and [facility] orth[facility]. Otherwise he has a keloid  scar which I told him could be managed with either topical therapy or injections by a dermatologist if it becomes more bothersome. There is been no significant recurrence of atrial fibrillation and we will need to monitor for this as he is only on low-dose aspirin. I do believe that this episode was related to his cholecystitis. He does seemed to be doing very well with his pacemaker and has had no problems with his coronary bypass grafts.  Plan to see him back in 6 months or sooner as necessary.  Pixie Casino, MD, [facility] Attending Cardiologist [facility]  Shatima Zalar C 12/27/2012, 2:07 PM

## 2012-12-31 ENCOUNTER — Ambulatory Visit (INDEPENDENT_AMBULATORY_CARE_PROVIDER_SITE_OTHER): Payer: Medicare Other

## 2012-12-31 DIAGNOSIS — I441 Atrioventricular block, second degree: Secondary | ICD-10-CM

## 2012-12-31 DIAGNOSIS — I4892 Unspecified atrial flutter: Secondary | ICD-10-CM

## 2012-12-31 DIAGNOSIS — I498 Other specified cardiac arrhythmias: Secondary | ICD-10-CM

## 2012-12-31 DIAGNOSIS — R001 Bradycardia, unspecified: Secondary | ICD-10-CM

## 2012-12-31 LAB — PACEMAKER DEVICE OBSERVATION

## 2012-12-31 IMAGING — US US ABDOMEN COMPLETE
1 series · 14 of 25 positions shown · non-contrast
Comparison: CT 09/03/2009

CLINICAL DATA: Chest pain, epigastric pain, nausea, elevated liver
function tests.

COMPLETE ABDOMINAL ULTRASOUND

[Series 1: us abdomen complete · 0.35mm/px · 14 of 82 slices shown]
[im 1/82]
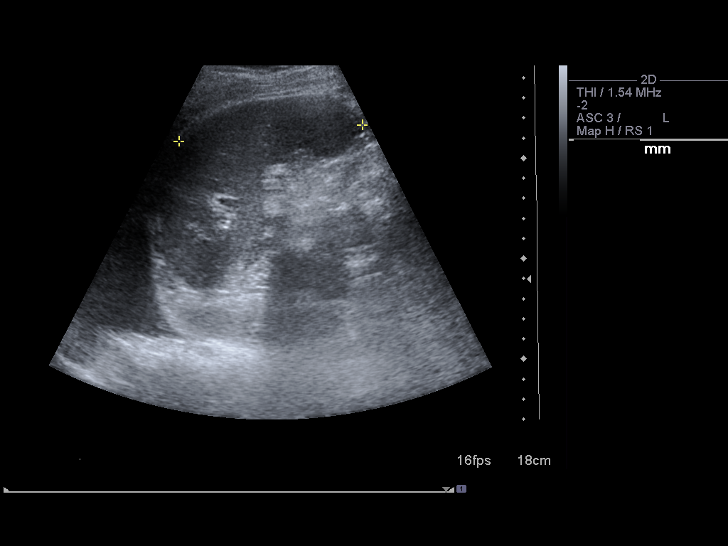
[im 7/82]
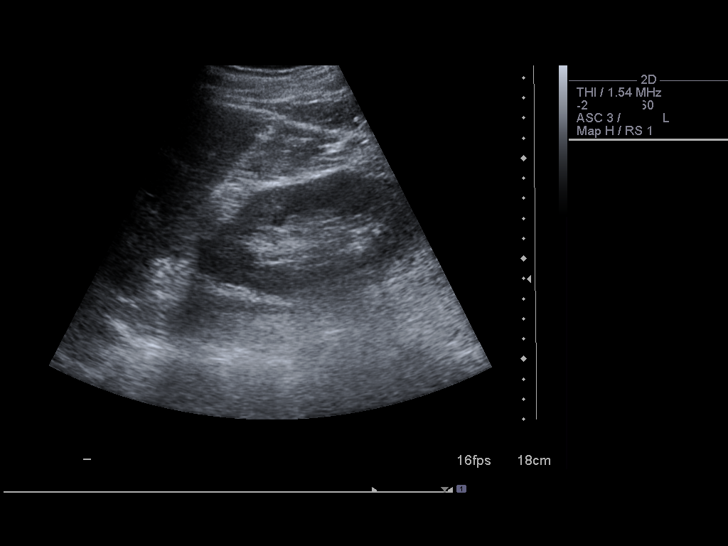
[im 14/82]
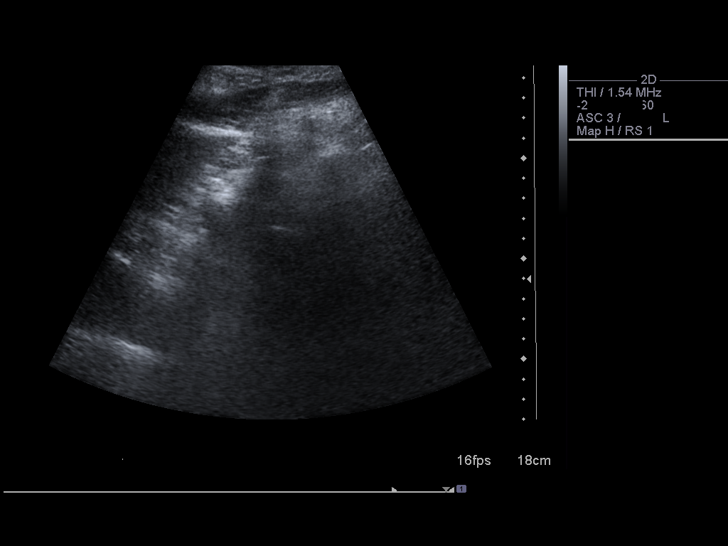
[im 21/82]
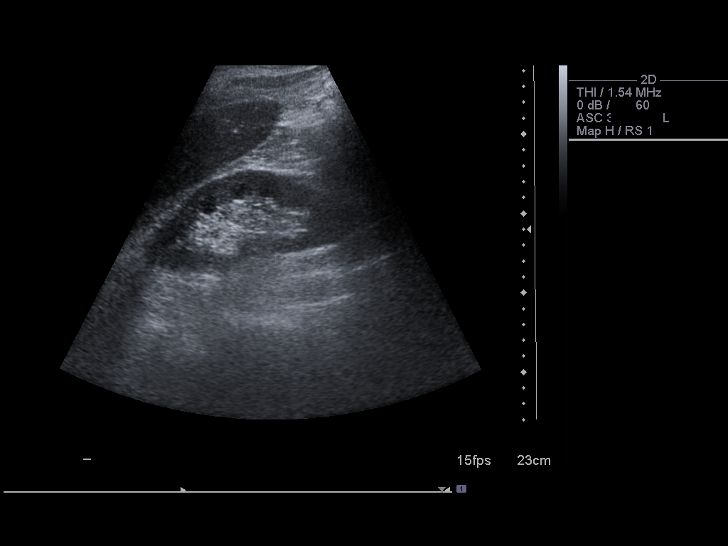
[im 28/82]
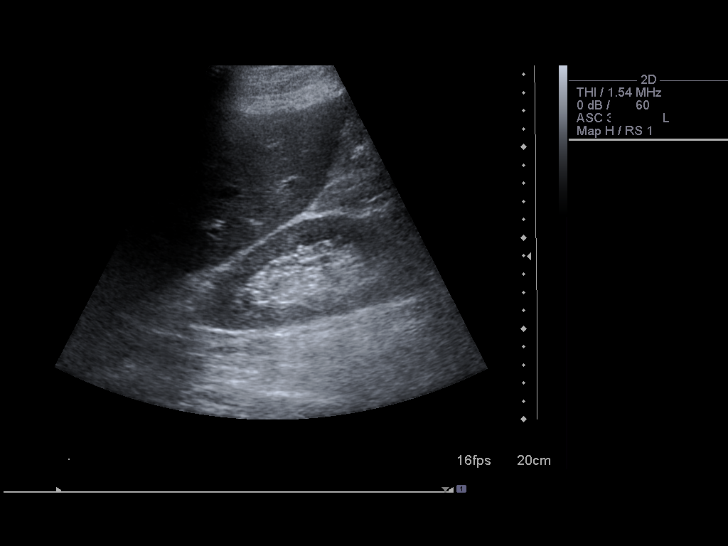
[im 31/82]
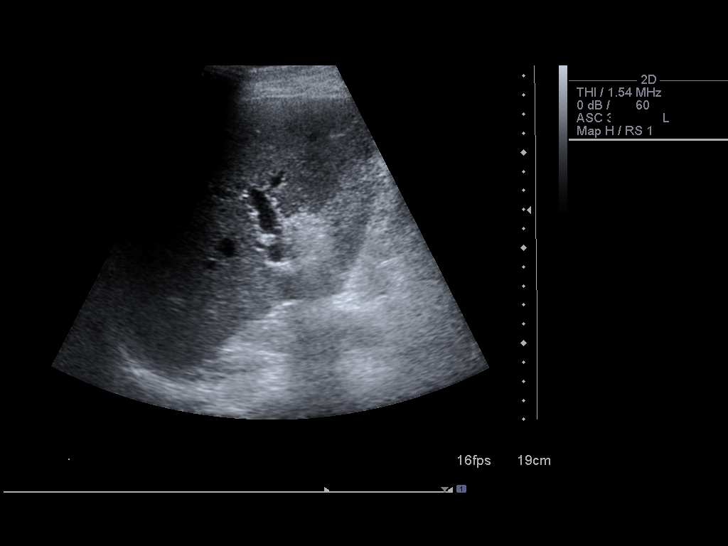
[im 38/82]
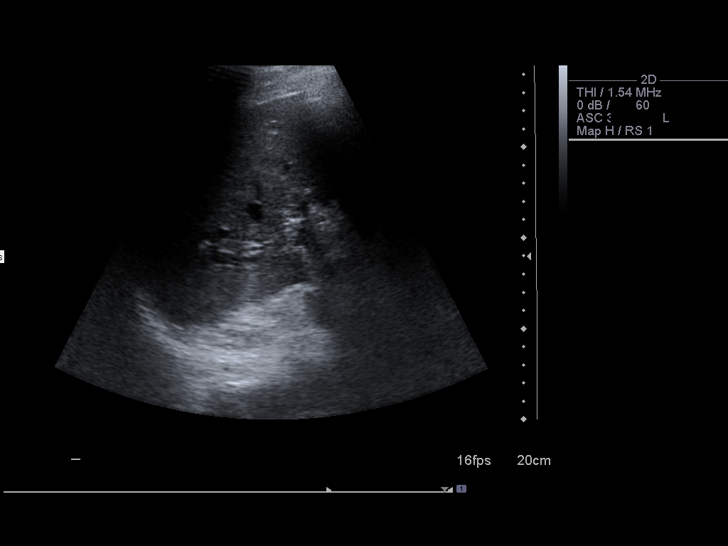
[im 44/82]
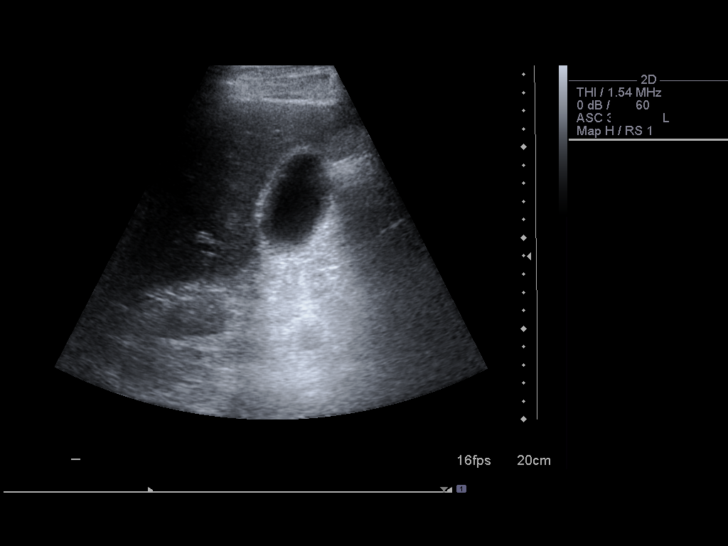
[im 51/82]
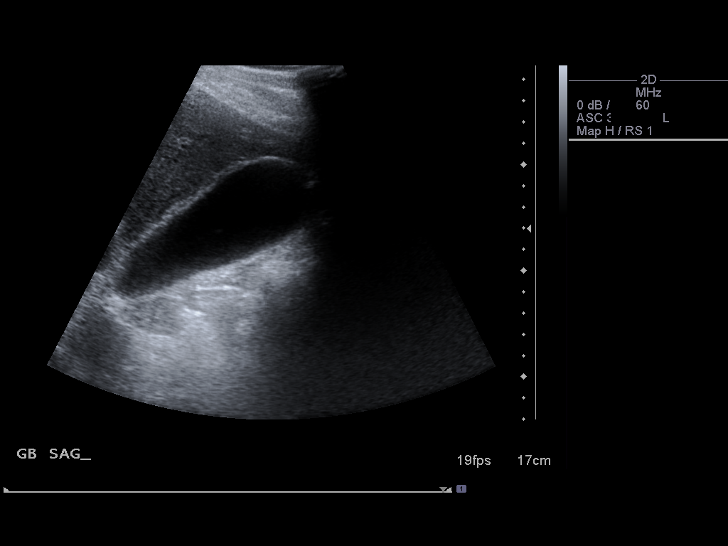
[im 55/82]
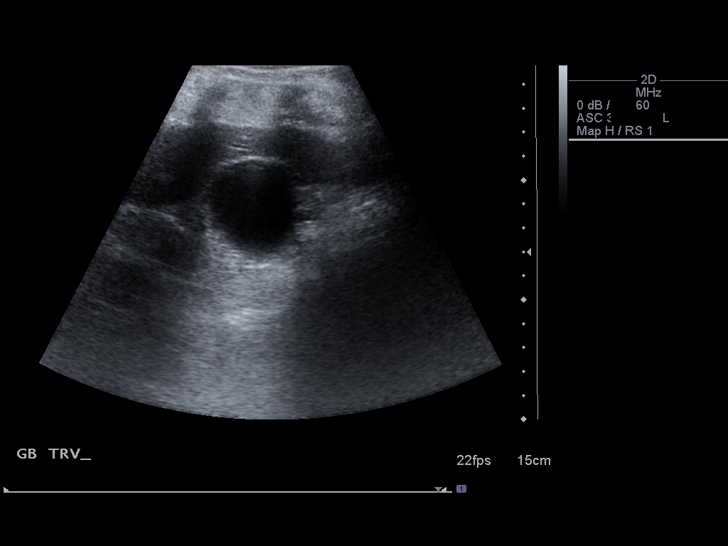
[im 61/82]
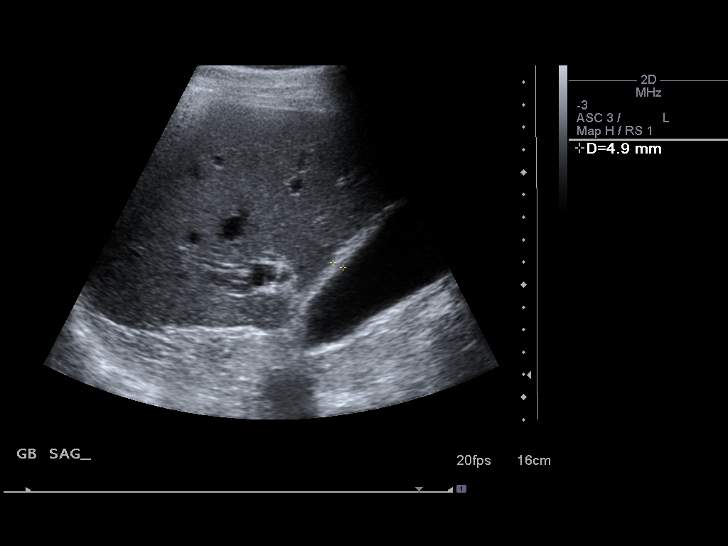
[im 68/82]
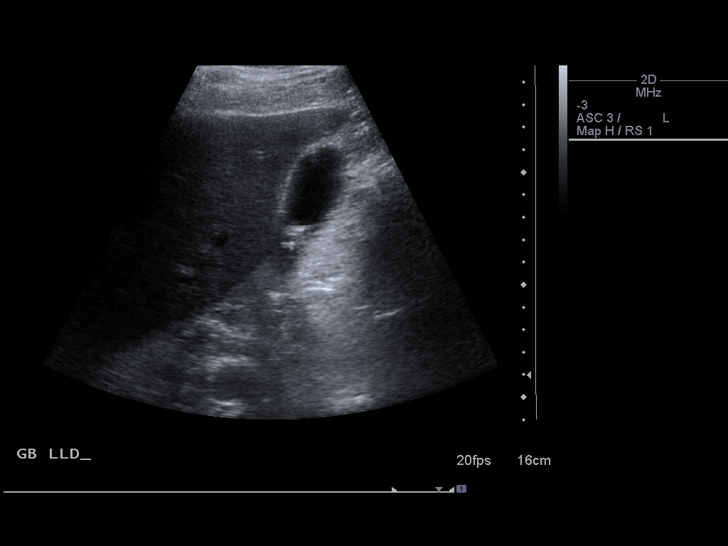
[im 75/82]
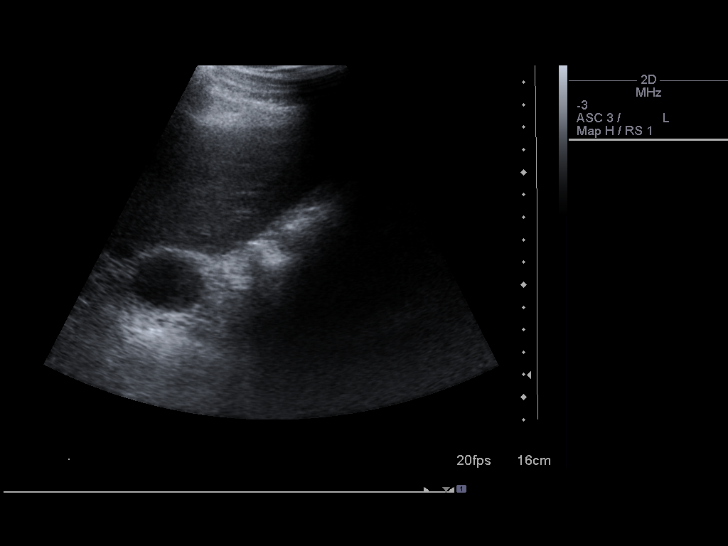
[im 82/82]
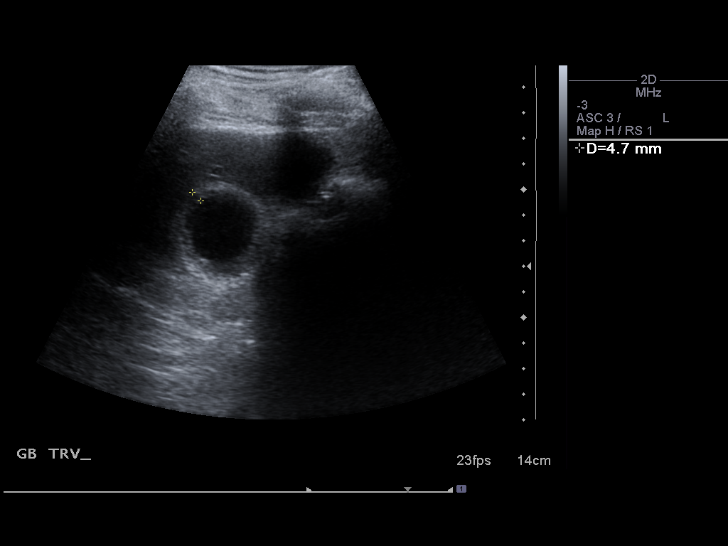

[14 of 25 positions shown; findings below may reference images not displayed]

FINDINGS: Gallbladder:  Small stones in the gallbladder neck with mild
diffuse gallbladder wall thickening at 4.8 mm.  Murphy's sign is
negative.  Ring down artifacts are present in the gallbladder wall
consistent with cholesterolosis.

Common bile duct:  No bile duct dilatation.  Extrahepatic bile
ducts measure about 5 mm diameter.

Liver:  No focal lesion identified.  Within normal limits in
parenchymal echogenicity.

IVC:  Appears normal.

Pancreas:  Not visualized due to overlying bowel gas.

Spleen:  Spleen length measures 9.2 cm.  Normal parenchymal
echotexture.

Right Kidney:  Right kidney measures 13.9 cm length.  No
hydronephrosis.

Left Kidney:  Left kidney measures 12.5 cm length.  No
hydronephrosis.  Portions of the left kidney are obscured by
overlying bowel gas.

Abdominal aorta:  No aneurysm identified.

Moderate sized left pleural effusion is incidentally noted.
IMPRESSION: Cholelithiasis with mild gallbladder wall thickening.
Cholesterolosis of the gallbladder.

## 2012-12-31 IMAGING — CR DG CHEST 2V
2 series · 2 of 2 positions shown · non-contrast
Comparison: 11/16/2010

CLINICAL DATA: Chest pain and short of breath

CHEST - 2 VIEW

[w chest pa]
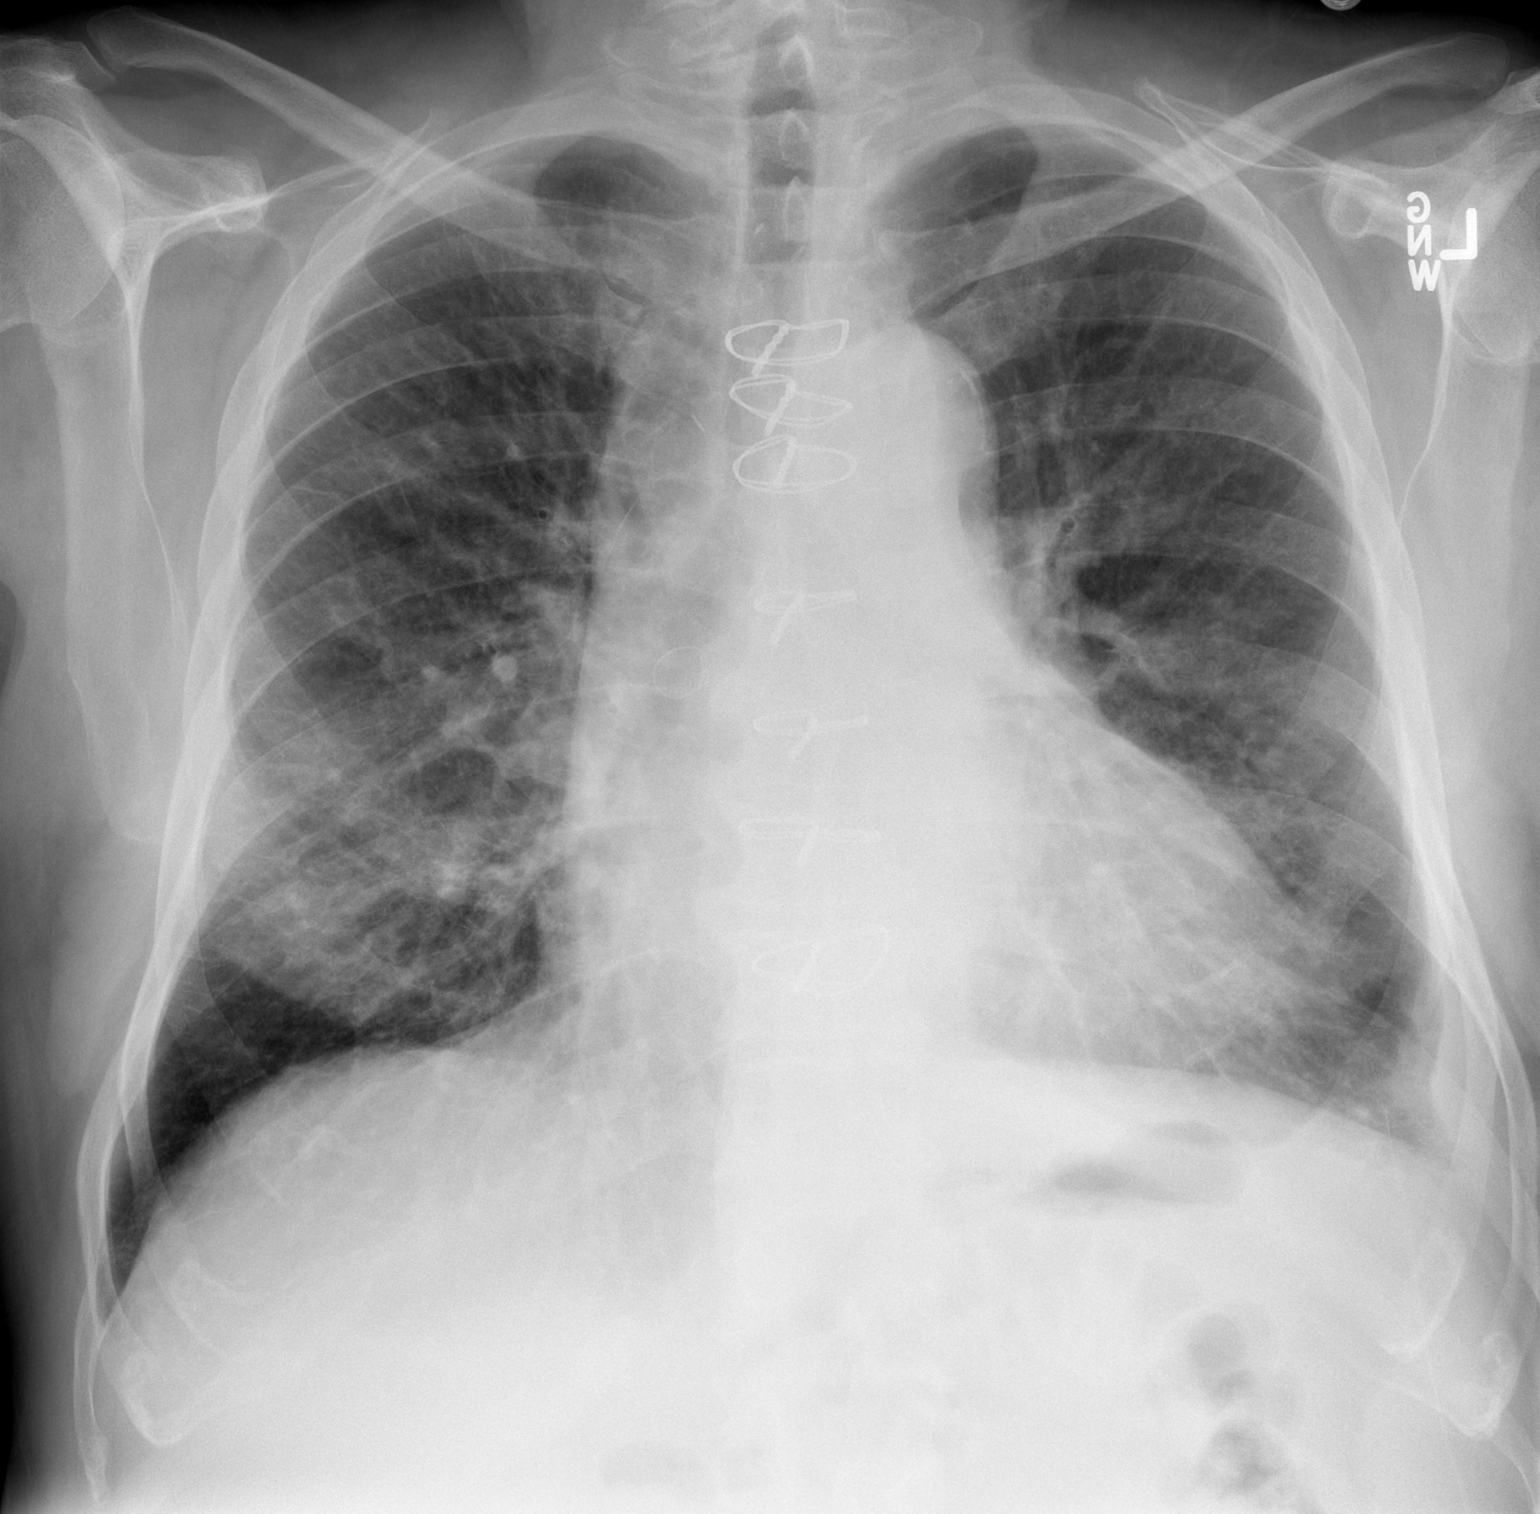

[w chest lat]
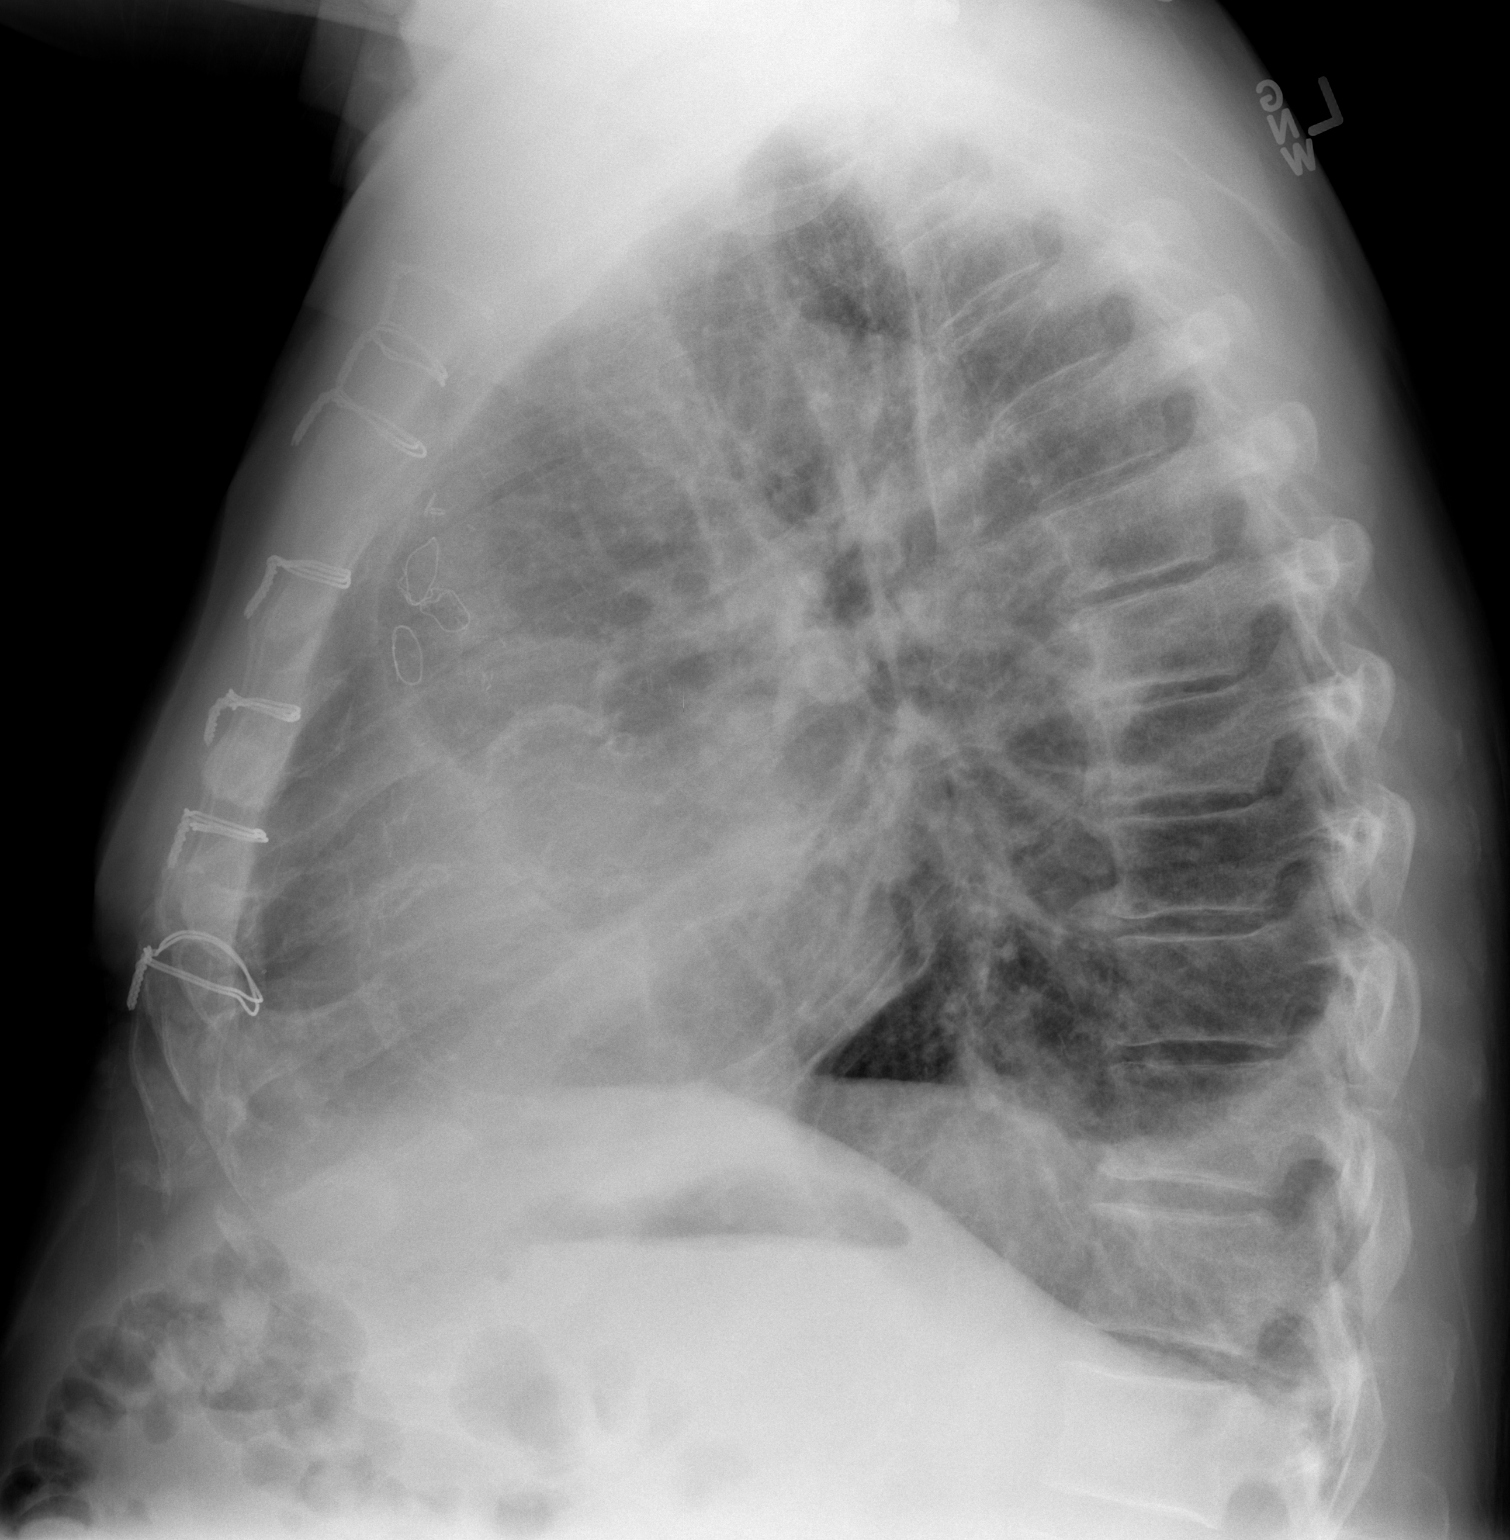

[2 of 2 positions shown; findings below may reference images not displayed]

FINDINGS: Stable postoperative changes in the chest.  Small left
pleural effusion and basilar atelectasis, decreasing since the
previous study.  No significant residual right pleural effusion.
Mild cardiac enlargement with increased pulmonary vascularity
suggesting congestive change.  Slight interstitial edema noted in
the lung bases.
IMPRESSION: Congestive changes in the heart and lungs with interstitial edema.
Improving left pleural effusion and basilar atelectasis.  No
significant residual right effusion.

## 2013-01-01 ENCOUNTER — Encounter: Payer: Self-pay | Admitting: *Deleted

## 2013-01-01 LAB — REMOTE PACEMAKER DEVICE
AL IMPEDENCE PM: 543 Ohm
AL THRESHOLD: 0.75 V
ATRIAL PACING PM: 78
BAMS-0001: 150 {beats}/min
RV LEAD THRESHOLD: 0.5 V

## 2013-03-25 ENCOUNTER — Ambulatory Visit (INDEPENDENT_AMBULATORY_CARE_PROVIDER_SITE_OTHER): Payer: Medicare Other | Admitting: Cardiology

## 2013-03-25 ENCOUNTER — Encounter: Payer: Self-pay | Admitting: Cardiology

## 2013-03-25 VITALS — BP 150/82 | HR 75 | Ht 71.0 in | Wt 209.0 lb

## 2013-03-25 DIAGNOSIS — E785 Hyperlipidemia, unspecified: Secondary | ICD-10-CM

## 2013-03-25 DIAGNOSIS — I5032 Chronic diastolic (congestive) heart failure: Secondary | ICD-10-CM | POA: Insufficient documentation

## 2013-03-25 DIAGNOSIS — I509 Heart failure, unspecified: Secondary | ICD-10-CM

## 2013-03-25 DIAGNOSIS — Z950 Presence of cardiac pacemaker: Secondary | ICD-10-CM

## 2013-03-25 DIAGNOSIS — I2510 Atherosclerotic heart disease of native coronary artery without angina pectoris: Secondary | ICD-10-CM

## 2013-03-25 MED ORDER — NITROGLYCERIN 0.4 MG SL SUBL: 0.4000 mg | SUBLINGUAL_TABLET | SUBLINGUAL | Status: DC | PRN

## 2013-03-25 MED ORDER — POTASSIUM CHLORIDE ER 8 MEQ PO TBCR: 16.0000 meq | EXTENDED_RELEASE_TABLET | Freq: Every day | ORAL | Status: DC

## 2013-03-25 NOTE — Assessment & Plan Note (Signed)
stable °

## 2013-03-25 NOTE — Progress Notes (Signed)
03/25/2013   PCP: Milagros Evener, MD   Chief Complaint  Patient presents with  . Follow-up    sob , no CP    Primary Cardiologist: Dr. Debara Pickett  HPI:  80 year-old gentleman who we have been following closely with a history of 5-vessel CABG and atrial flutter in 2012. He has done well with lack of chest pain; however.  He also has significant abnormalities in his EKG as well indicating a conduction delay. When his episode of atrial flutter was occurring his ventricular response was only in the 40s. He was also noted on EKG to have trifascicular block and periods of Wenckebach. He had some more Wenckebach and couplets as well as continued trifascicular block. He has had paroxysmal A-flutter and we have kept him off a beta blocker for those reasons.  Monitor demonstrated significant bradycardia for which he was symptomatic and ultimately underwent a permanent pacemaker-Medtronic. He reports that this has helped him significantly.   Dr. Sallyanne Kuster follows his pacemaker.  Of note he recently had acute cholecystitis and is status post cholecystectomy.   Pt is here today at the request of his PCP secondary to fluid on his lungs by CXR.  He had been seen for cough, cold, and sounds like bronchitis.  He did not mention PNA.  He was placed on amoxicillin.  Additionally pt had stopped his lasix and K+, he could not tell me why except he has difficulty swallowing K+ tabs, they make him choke.  He was interested in changing to slow K+.  Also while he denies chest pain, he did request NTG sl.  I did send in this script.   Pt stated he was feeling better.  No SOB now and minimal swelling at the end of the day.  Wt is down from what he said his wt was on Friday by 9 pounds, though he admits to wearing more clothes at that time.  Overall much improved with lasix and ABX.    No Known Allergies  Current Outpatient Prescriptions  Medication Sig Dispense Refill  . albuterol (PROVENTIL) (2.5 MG/3ML)  0.083% nebulizer solution Take 2.5 mg by nebulization every 6 (six) hours as needed for wheezing or shortness of breath.      Marland Kitchen amLODipine (NORVASC) 5 MG tablet Take 1 tablet (5 mg total) by mouth daily.  30 tablet  6  . amoxicillin (AMOXIL) 875 MG tablet Take 875 mg by mouth 2 (two) times daily.      Marland Kitchen aspirin 81 MG tablet Take 81 mg by mouth daily.        . furosemide (LASIX) 40 MG tablet Take 40 mg by mouth daily as needed (for swelling).       Marland Kitchen lisinopril (PRINIVIL,ZESTRIL) 10 MG tablet Take 10 mg by mouth 2 (two) times daily.       . metFORMIN (GLUCOPHAGE) 1000 MG tablet Take 500 mg by mouth 2 (two) times daily with a meal.       . metoprolol tartrate (LOPRESSOR) 25 MG tablet TAKE 1 TABLET BY MOUTH TWICE DAILY  60 tablet  6  . traZODone (DESYREL) 50 MG tablet Take 50-100 mg by mouth at bedtime as needed for sleep.      . nitroGLYCERIN (NITROSTAT) 0.4 MG SL tablet Place 1 tablet (0.4 mg total) under the tongue every 5 (five) minutes as needed for chest pain.  25 tablet  3  . potassium chloride (KLOR-CON) 8 MEQ tablet Take 2 tablets (  16 mEq total) by mouth daily.  60 tablet  6   No current facility-administered medications for this visit.    Past Medical History  Diagnosis Date  . HTN (hypertension)   . Dyslipidemia   . DM2 (diabetes mellitus, type 2)   . COPD (chronic obstructive pulmonary disease)   . CHF (congestive heart failure)   . Hyperlipidemia   . Sore throat   . Cough   . Wheezing   . Leg swelling   . Constipation   . Diarrhea   . Coronary artery disease   . Atrial flutter     severe bradycardia  . Shortness of breath   . Pacemaker 05/22/2012    Dr  Croitoru    Medtronic Adapta  . Mobitz type 1 second degree atrioventricular block   . 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 SVG to firts & second diagonals,SVG to forth posterolateral branch to RCA  . Diastolic dysfunction     Echo 0000000 I diastolic dysfunction,EF 0000000    Past  Surgical History  Procedure Laterality Date  . Cardiac catheterization  09/10/2010    severe LAD,D1,D2 & ostial D2  . Cabg x5  09/13/2010    Dr Farrel Conners to LAD,SVG to obtuse marginal,seq. SVG to first & second diagonals,SVG to fourth posterolateral branch RCA  . Repair of umbilical hernia.    . Repair of left indirect inguinal hernia with mesh.    . Cholecystectomy  october 2012  . Coronary artery bypass graft    . Insert / replace / remove pacemaker  05/22/2012     dual chamber    [id number] cold/bronchitis no fevers,  weight is down 9 pounds from PCP visit Friday Skin:no rashes or ulcers HEENT:no blurred vision, no congestion CV:see HPI PUL:see HPI GI:no diarrhea constipation or melena, no indigestion GU:no hematuria, no dysuria MS:no joint pain, no claudication Neuro:no syncope, no lightheadedness Endo:+ diabetes stable, no thyroid disease  PHYSICAL EXAM BP 150/82  Pulse 75  Ht 5\' 11"  (1.803 m)  Wt 209 lb (94.802 kg)  BMI 29.16 kg/m2 General:Pleasant affect, NAD, though does appear he does not feel his normal. Skin:Warm and dry, brisk capillary refill HEENT:normocephalic, sclera clear, mucus membranes moist Neck:supple, no JVD, no bruits  Heart:S1S2 RRR without murmur, gallup, rub or click Lungs:clear without rales, rhonchi, or wheezes [id number], non tender, + BS, do not palpate liver spleen or masses Ext:no lower ext edema, 2+ pedal pulses, 2+ radial pulses Neuro:alert and oriented, MAE, follows commands, + facial symmetry EKG:AV Pacing  ASSESSMENT AND PLAN CHF (congestive heart failure) in combination with bronchitis Pt sent to [place] by PCP for fluid in lungs in addition to bronchitis.  Pt on antibiotic.  He had previously stopped his lasix - no definite answer as to why, but he does have trouble swallowing his K+ tab.  He is improved from last week.  I changed his KDur to slow K+ for better swallowing but at 16 meq daily with lasix.  If symptoms return he will follow up  with PCP or Dr. Debara Pickett.  He should continue his lasix.  CAD (coronary artery disease) No chest pain.  Pt was asking for NTG, I did prescribe.  Pacemaker AV Pacing.  Dyslipidemia stable

## 2013-03-25 NOTE — Assessment & Plan Note (Signed)
No chest pain.  Pt was asking for NTG, I did prescribe.

## 2013-03-25 NOTE — Assessment & Plan Note (Signed)
AV Pacing.

## 2013-03-25 NOTE — Assessment & Plan Note (Signed)
Pt sent to [place] by PCP for fluid in lungs in addition to bronchitis.  Pt on antibiotic.  He had previously stopped his lasix - no definite answer as to why, but he does have trouble swallowing his K+ tab.  He is improved from last week.  I changed his KDur to slow K+ for better swallowing but at 16 meq daily with lasix.  If symptoms return he will follow up with PCP or Dr. Debara Pickett.  He should continue his lasix.

## 2013-03-25 NOTE — Patient Instructions (Signed)
Change your K Dur potassium to slow K potassium, should be easier to take, but you will need to take 2 of the slow K+ tabs daily with the Furosemide  Call if increasing Shortness of breath or swelling  Call your primary if any fevers or increased cough. Follow up with Dr. Debara Pickett in April unless problems prior to that time.

## 2013-04-03 ENCOUNTER — Ambulatory Visit (INDEPENDENT_AMBULATORY_CARE_PROVIDER_SITE_OTHER): Payer: Medicare Other | Admitting: *Deleted

## 2013-04-03 DIAGNOSIS — I4892 Unspecified atrial flutter: Secondary | ICD-10-CM

## 2013-04-04 LAB — MDC_IDC_ENUM_SESS_TYPE_REMOTE
Battery Remaining Longevity: 148 mo
Brady Statistic AS VS Percent: 0 %
Date Time Interrogation Session: 20150129114735
Lead Channel Impedance Value: 504 Ohm
Lead Channel Impedance Value: 842 Ohm
Lead Channel Pacing Threshold Pulse Width: 0.4 ms
Lead Channel Pacing Threshold Pulse Width: 0.4 ms
Lead Channel Setting Pacing Amplitude: 1.5 V
Lead Channel Setting Pacing Amplitude: 2 V
Lead Channel Setting Sensing Sensitivity: 2.8 mV
MDC IDC MSMT BATTERY IMPEDANCE: 111 Ohm
MDC IDC MSMT BATTERY VOLTAGE: 2.79 V
MDC IDC MSMT LEADCHNL RA PACING THRESHOLD AMPLITUDE: 0.75 V
MDC IDC MSMT LEADCHNL RA SENSING INTR AMPL: 1.4 mV
MDC IDC MSMT LEADCHNL RV PACING THRESHOLD AMPLITUDE: 0.5 V
MDC IDC SET LEADCHNL RV PACING PULSEWIDTH: 0.4 ms
MDC IDC STAT BRADY AP VP PERCENT: 77 %
MDC IDC STAT BRADY AP VS PERCENT: 0 %
MDC IDC STAT BRADY AS VP PERCENT: 23 %

## 2013-04-04 LAB — PACEMAKER DEVICE OBSERVATION

## 2013-04-11 ENCOUNTER — Encounter: Payer: Self-pay | Admitting: *Deleted

## 2013-06-04 ENCOUNTER — Encounter (HOSPITAL_COMMUNITY): Payer: Self-pay | Admitting: Emergency Medicine

## 2013-06-04 ENCOUNTER — Emergency Department (INDEPENDENT_AMBULATORY_CARE_PROVIDER_SITE_OTHER)
Admission: EM | Admit: 2013-06-04 | Discharge: 2013-06-04 | Disposition: A | Discharge status: Home / Self Care | Payer: Medicare Other | Source: Home / Self Care | Attending: Family Medicine | Admitting: Family Medicine

## 2013-06-04 DIAGNOSIS — J302 Other seasonal allergic rhinitis: Secondary | ICD-10-CM

## 2013-06-04 DIAGNOSIS — J309 Allergic rhinitis, unspecified: Secondary | ICD-10-CM

## 2013-06-04 MED ORDER — FEXOFENADINE HCL 180 MG PO TABS: 180.0000 mg | ORAL_TABLET | Freq: Every day | ORAL | Status: DC

## 2013-06-04 MED ORDER — FLUTICASONE PROPIONATE 50 MCG/ACT NA SUSP: 1.0000 | Freq: Two times a day (BID) | NASAL | Status: DC

## 2013-06-04 NOTE — ED Notes (Signed)
Pt  Reports  Symptoms  Of  sorethroat   Runny  Nose   /  Congested   Symptoms  X  sev      Days     Pt    Reports  Cough is  Productive  At  Times

## 2013-06-04 NOTE — ED Provider Notes (Addendum)
CSN: [id number]     Arrival date & time 06/04/13  1247 History   First MD Initiated Contact with Patient 06/04/13 1442     Chief Complaint  Patient presents with  . URI   (Consider location/radiation/quality/duration/timing/severity/associated sxs/prior Treatment) Patient is a 80 y.o. male presenting with URI. The history is provided by the patient.  URI Presenting symptoms: congestion, cough and rhinorrhea   Presenting symptoms: no fever and no sore throat   Severity:  Mild Onset quality:  Sudden Duration:  1 day Progression:  Unchanged Chronicity:  New Relieved by:  None tried Worsened by:  Nothing tried Ineffective treatments:  None tried Associated symptoms: sneezing   Associated symptoms: no wheezing   Risk factors: being elderly, chronic cardiac disease and diabetes mellitus     Past Medical History  Diagnosis Date  . HTN (hypertension)   . Dyslipidemia   . DM2 (diabetes mellitus, type 2)   . COPD (chronic obstructive pulmonary disease)   . CHF (congestive heart failure)   . Hyperlipidemia   . Sore throat   . Cough   . Wheezing   . Leg swelling   . Constipation   . Diarrhea   . Coronary artery disease   . Atrial flutter     severe bradycardia  . Shortness of breath   . Pacemaker 05/22/2012    Dr  Croitoru    Medtronic Adapta  . Mobitz type 1 second degree atrioventricular block   . 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 SVG to firts & second diagonals,SVG to forth posterolateral branch to RCA  . Diastolic dysfunction     Echo 0000000 I diastolic dysfunction,EF 0000000   Past Surgical History  Procedure Laterality Date  . Cardiac catheterization  09/10/2010    severe LAD,D1,D2 & ostial D2  . Cabg x5  09/13/2010    Dr Farrel Conners to LAD,SVG to obtuse marginal,seq. SVG to first & second diagonals,SVG to fourth posterolateral branch RCA  . Repair of umbilical hernia.    . Repair of left indirect inguinal hernia with mesh.    .  Cholecystectomy  october 2012  . Coronary artery bypass graft    . Insert / replace / remove pacemaker  05/22/2012     dual chamber   Family History  Problem Relation Age of Onset  . Heart disease Father   . Heart disease Brother     s/p cabg  . Heart disease Maternal Uncle    History  Substance Use Topics  . Smoking status: Former Smoker -- 40 years    Types: Cigarettes    Quit date: 08/06/2010  . Smokeless tobacco: Never Used  . Alcohol Use: Yes     Comment: occasional    Review of Systems  Constitutional: Negative.  Negative for fever.  HENT: Positive for congestion, postnasal drip, rhinorrhea and sneezing. Negative for sore throat.   Respiratory: Positive for cough. Negative for wheezing.   Gastrointestinal: Negative.   Musculoskeletal: Negative.   Skin: Negative.     Allergies  Review of patient's allergies indicates no known allergies.  Home Medications   Current Outpatient Rx  Name  Route  Sig  Dispense  Refill  . albuterol (PROVENTIL) (2.5 MG/3ML) 0.083% nebulizer solution   Nebulization   Take 2.5 mg by nebulization every 6 (six) hours as needed for wheezing or shortness of breath.         Marland Kitchen amLODipine (NORVASC) 5 MG tablet   Oral  Take 1 tablet (5 mg total) by mouth daily.   30 tablet   6   . amoxicillin (AMOXIL) 875 MG tablet   Oral   Take 875 mg by mouth 2 (two) times daily.         Marland Kitchen aspirin 81 MG tablet   Oral   Take 81 mg by mouth daily.           . fexofenadine (ALLEGRA) 180 MG tablet   Oral   Take 1 tablet (180 mg total) by mouth daily.   30 tablet   1   . fluticasone (FLONASE) 50 MCG/ACT nasal spray   Each Nare   Place 1 spray into both nostrils 2 (two) times daily.   1 g   2   . furosemide (LASIX) 40 MG tablet   Oral   Take 40 mg by mouth daily as needed (for swelling).          Marland Kitchen lisinopril (PRINIVIL,ZESTRIL) 10 MG tablet   Oral   Take 10 mg by mouth 2 (two) times daily.          . metFORMIN (GLUCOPHAGE) 1000 MG  tablet   Oral   Take 500 mg by mouth 2 (two) times daily with a meal.          . metoprolol tartrate (LOPRESSOR) 25 MG tablet      TAKE 1 TABLET BY MOUTH TWICE DAILY   60 tablet   6   . nitroGLYCERIN (NITROSTAT) 0.4 MG SL tablet   Sublingual   Place 1 tablet (0.4 mg total) under the tongue every 5 (five) minutes as needed for chest pain.   25 tablet   3   . potassium chloride (KLOR-CON) 8 MEQ tablet   Oral   Take 2 tablets (16 mEq total) by mouth daily.   60 tablet   6   . traZODone (DESYREL) 50 MG tablet   Oral   Take 50-100 mg by mouth at bedtime as needed for sleep.          BP 116/51  Pulse 65  Temp(Src) 100.2 F (37.9 C) (Oral)  Resp 16  SpO2 99% Physical Exam  Nursing note and vitals reviewed. Constitutional: He is oriented to person, place, and time. He appears well-developed and well-nourished.  HENT:  Head: Normocephalic.  Right Ear: External ear normal.  Left Ear: External ear normal.  Nose: Mucosal edema and rhinorrhea present.  Mouth/Throat: Oropharynx is clear and moist.  Neck: Normal range of motion. Neck supple.  Cardiovascular: Normal heart sounds.   Pulmonary/Chest: Effort normal and breath sounds normal.  Lymphadenopathy:    He has no cervical adenopathy.  Neurological: He is alert and oriented to person, place, and time.  Skin: Skin is warm and dry.    ED Course  Procedures (including critical care time) Labs Review Labs Reviewed - No data to display Imaging Review No results found.   MDM   1. Seasonal allergic rhinitis       Billy Fischer, MD 06/04/13 [place], MD 06/05/13 2051

## 2013-06-12 ENCOUNTER — Other Ambulatory Visit: Payer: Self-pay

## 2013-06-12 MED ORDER — POTASSIUM CHLORIDE CRYS ER 20 MEQ PO TBCR: 20.0000 meq | EXTENDED_RELEASE_TABLET | Freq: Every day | ORAL | Status: DC

## 2013-06-12 NOTE — Telephone Encounter (Signed)
Received fax - Klor-Con 8 mEq was too expensive. Discussed with Cecilie Kicks, NP - she changed medication to K-Dur 20 mEq 1 tablet daily. Rx was sent to pharmacy electronically.

## 2013-06-21 ENCOUNTER — Encounter: Payer: Self-pay | Admitting: Cardiovascular Disease

## 2013-06-21 ENCOUNTER — Ambulatory Visit (INDEPENDENT_AMBULATORY_CARE_PROVIDER_SITE_OTHER): Payer: Medicare Other | Admitting: Cardiovascular Disease

## 2013-06-21 VITALS — BP 132/76 | HR 76 | Resp 16 | Ht 71.0 in | Wt 206.8 lb

## 2013-06-21 DIAGNOSIS — I4892 Unspecified atrial flutter: Secondary | ICD-10-CM

## 2013-06-21 DIAGNOSIS — Z950 Presence of cardiac pacemaker: Secondary | ICD-10-CM

## 2013-06-21 DIAGNOSIS — I471 Supraventricular tachycardia: Secondary | ICD-10-CM

## 2013-06-21 LAB — PACEMAKER DEVICE OBSERVATION

## 2013-06-21 NOTE — Patient Instructions (Signed)
Remote monitoring is used to monitor your Pacemaker from home. This monitoring reduces the number of office visits required to check your device to one time per year. It allows [place] to keep an eye on the functioning of your device to ensure it is working properly. You are scheduled for a device check from home on 09-23-2013. You may send your transmission at any time that day. If you have a wireless device, the transmission will be sent automatically. After your physician reviews your transmission, you will receive a postcard with your next transmission date.  Your physician recommends that you schedule a follow-up appointment in: [place]

## 2013-06-23 ENCOUNTER — Encounter: Payer: Self-pay | Admitting: Cardiovascular Disease

## 2013-06-23 NOTE — Progress Notes (Signed)
Patient ID: Jesus Oconnor, male   DOB: 05/20/1933, 80 y.o.   MRN: [id number]      Reason for office visit Pacemaker follow up, second degree AV block, CAD  history of 5-vessel CABG and atrial flutter in 2012. He has done well with lack of chest pain; however. He also has significant abnormalities in his EKG as well indicating a conduction delay. When his episode of atrial flutter was occurring his ventricular response was only in the 40s. He was also noted on EKG to have trifascicular block and periods of Wenckebach. He had some more Wenckebach with significant bradycardia for which he was symptomatic and ultimately underwent a dual chamber permanent pacemaker-Medtronic, March 2014.   His atrial arrhythmia occurred during acute illness only and he been taking ASA for embolism prevention. atrial fibrillation Has not been recorded by his pacemaker. His device has recorded two lengthy episodes of irregular atrial tachycardia each lasting 40-50 minutes. These occurred last November and this February. The atrial rate was around 150 beats per minute. Both episodes were asymptomatic. During both episodes there was ventricular pacing with what appears to be complete heart block.  No Known Allergies  Current Outpatient Prescriptions  Medication Sig Dispense Refill  . albuterol (PROVENTIL) (2.5 MG/3ML) 0.083% nebulizer solution Take 2.5 mg by nebulization every 6 (six) hours as needed for wheezing or shortness of breath.      Marland Kitchen amLODipine (NORVASC) 5 MG tablet Take 1 tablet (5 mg total) by mouth daily.  30 tablet  6  . aspirin 81 MG tablet Take 81 mg by mouth daily.        . fluticasone (FLONASE) 50 MCG/ACT nasal spray Place 1 spray into both nostrils 2 (two) times daily.  1 g  2  . furosemide (LASIX) 40 MG tablet Take 40 mg by mouth daily as needed (for swelling).       Marland Kitchen lisinopril (PRINIVIL,ZESTRIL) 10 MG tablet Take 10 mg by mouth 2 (two) times daily.       . metFORMIN (GLUCOPHAGE) 1000 MG tablet  Take 500 mg by mouth 2 (two) times daily with a meal.       . metoprolol tartrate (LOPRESSOR) 25 MG tablet TAKE 1 TABLET BY MOUTH TWICE DAILY  60 tablet  6  . nitroGLYCERIN (NITROSTAT) 0.4 MG SL tablet Place 1 tablet (0.4 mg total) under the tongue every 5 (five) minutes as needed for chest pain.  25 tablet  3  . potassium chloride SA (K-DUR,KLOR-CON) 20 MEQ tablet Take 1 tablet (20 mEq total) by mouth daily.  30 tablet  9  . traZODone (DESYREL) 50 MG tablet Take 50-100 mg by mouth at bedtime as needed for sleep.       No current facility-administered medications for this visit.    Past Medical History  Diagnosis Date  . HTN (hypertension)   . Dyslipidemia   . DM2 (diabetes mellitus, type 2)   . COPD (chronic obstructive pulmonary disease)   . CHF (congestive heart failure)   . Hyperlipidemia   . Sore throat   . Cough   . Wheezing   . Leg swelling   . Constipation   . Diarrhea   . Coronary artery disease   . Atrial flutter     severe bradycardia  . Shortness of breath   . Pacemaker 05/22/2012    Dr  Mattye Verdone    Medtronic Adapta  . Mobitz type 1 second degree atrioventricular block   . 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 SVG to firts & second diagonals,SVG to forth posterolateral branch to RCA  . Diastolic dysfunction     Echo 0000000 I diastolic dysfunction,EF 0000000    Past Surgical History  Procedure Laterality Date  . Cardiac catheterization  09/10/2010    severe LAD,D1,D2 & ostial D2  . Cabg x5  09/13/2010    Dr Farrel Conners to LAD,SVG to obtuse marginal,seq. SVG to first & second diagonals,SVG to fourth posterolateral branch RCA  . Repair of umbilical hernia.    . Repair of left indirect inguinal hernia with mesh.    . Cholecystectomy  october 2012  . Coronary artery bypass graft    . Insert / replace / remove pacemaker  05/22/2012     dual chamber    Family History  Problem Relation Age of Onset  . Heart disease Father   . Heart  disease Brother     s/p cabg  . Heart disease Maternal Uncle     History   Social History  . Marital Status: Married    Spouse Name: N/A    Number of Children: 2  . Years of Education: N/A   Occupational History  . Not on file.   Social History Main Topics  . Smoking status: Former Smoker -- 40 years    Types: Cigarettes    Quit date: 08/06/2010  . Smokeless tobacco: Never Used  . Alcohol Use: Yes     Comment: occasional  . Drug Use: No  . Sexual Activity: Not on file   Other Topics Concern  . Not on file   Social History Narrative  . No narrative on file    Review of systems: The patient specifically denies any chest pain at rest or with exertion, dyspnea at rest or with exertion, orthopnea, paroxysmal nocturnal dyspnea, syncope, palpitations, focal neurological deficits, intermittent claudication, lower extremity edema, unexplained weight gain, cough, hemoptysis or wheezing.  The patient also denies abdominal pain, nausea, vomiting, dysphagia, diarrhea, constipation, polyuria, polydipsia, dysuria, hematuria, frequency, urgency, abnormal bleeding or bruising, fever, chills, unexpected weight changes, mood swings, change in skin or hair texture, change in voice quality, auditory or visual problems, allergic reactions or rashes, new musculoskeletal complaints other than usual "aches and pains".   PHYSICAL EXAM BP 132/76  Pulse 76  Resp 16  Ht 5\' 11"  (1.803 m)  Wt 206 lb 12.8 oz (93.804 kg)  BMI 28.86 kg/m2  General: Alert, oriented x3, no distress Head: no evidence of trauma, PERRL, EOMI, no exophtalmos or lid lag, no myxedema, no xanthelasma; normal ears, nose and oropharynx Neck: normal jugular venous pulsations and no hepatojugular reflux; brisk carotid pulses without delay and no carotid bruits Chest: clear to auscultation, no signs of consolidation by percussion or palpation, normal fremitus, symmetrical and full respiratory excursions, healthy left subclavian  pacemaker site Cardiovascular: normal position and quality of the apical impulse, regular rhythm, normal first and second heart sounds, no murmurs, rubs or gallops Abdomen: no tenderness or distention, no masses by palpation, no abnormal pulsatility or arterial bruits, normal bowel sounds, no hepatosplenomegaly Extremities: no clubbing, cyanosis or edema; 2+ radial, ulnar and brachial pulses bilaterally; 2+ right femoral, posterior tibial and dorsalis pedis pulses; 2+ left femoral, posterior tibial and dorsalis pedis pulses; no subclavian or femoral bruits Neurological: grossly nonfocal   EKG: AV paced  Lipid Panel     Component Value Date/Time   CHOL 176 09/10/2010 0623   TRIG 94 09/10/2010 0623   HDL 50  09/10/2010 0623   CHOLHDL 3.5 09/10/2010 0623   VLDL 19 09/10/2010 0623   LDLCALC 107* 09/10/2010 0623    BMET    Component Value Date/Time   NA 139 05/22/2012 1058   K 3.9 05/22/2012 1058   CL 101 05/22/2012 1058   CO2 26 05/22/2012 1058   GLUCOSE 119* 05/22/2012 1058   BUN 11 05/22/2012 1058   CREATININE 0.84 05/22/2012 1058   CALCIUM 9.1 05/22/2012 1058   GFRNONAA 83* 05/22/2012 1058   GFRAA >90 05/22/2012 1058     ASSESSMENT AND PLAN  PAT (paroxysmal atrial tachycardia) Atrial tachycardia was not symptomatic and he has not had any atrial fibrillation. Continue aspirin. Medical therapy does not appear to be necessary for orders otherwise a fairly brief and rare arrhythmia. Continue to monitor for atrial fibrillation or atrial flutter, rhythms with higher embolic risk.  Pacemaker Normal device function. Continue remote pacemaker downloads every 3 months and followup in clinic with me in one year. He'll followup with Dr. Debara Pickett for his other cardiac problems.    Patient Instructions  Remote monitoring is used to monitor your Pacemaker from home. This monitoring reduces the number of office visits required to check your device to one time per year. It allows [place] to keep an eye on the  functioning of your device to ensure it is working properly. You are scheduled for a device check from home on 09-23-2013. You may send your transmission at any time that day. If you have a wireless device, the transmission will be sent automatically. After your physician reviews your transmission, you will receive a postcard with your next transmission date.  Your physician recommends that you schedule a follow-up appointment in: [place] [facility] [place]armo  Sanda Klein, MD, [facility] [phone] office [phone] pager

## 2013-06-26 DIAGNOSIS — I471 Supraventricular tachycardia: Secondary | ICD-10-CM | POA: Insufficient documentation

## 2013-06-26 NOTE — Assessment & Plan Note (Signed)
Atrial tachycardia was not symptomatic and he has not had any atrial fibrillation. Continue aspirin. Medical therapy does not appear to be necessary for orders otherwise a fairly brief and rare arrhythmia. Continue to monitor for atrial fibrillation or atrial flutter, rhythms with higher embolic risk.

## 2013-06-26 NOTE — Assessment & Plan Note (Signed)
Normal device function. Continue remote pacemaker downloads every 3 months and followup in clinic with me in one year. He'll followup with Dr. Debara Pickett for his other cardiac problems.

## 2013-06-27 LAB — MDC_IDC_ENUM_SESS_TYPE_INCLINIC
Battery Impedance: 111 Ohm
Battery Voltage: 2.8 V
Brady Statistic AP VP Percent: 75.9 %
Brady Statistic AP VS Percent: 0.4 %
Brady Statistic AS VP Percent: 23.5 %
Lead Channel Impedance Value: 527 Ohm
Lead Channel Pacing Threshold Amplitude: 0.5 V
Lead Channel Pacing Threshold Pulse Width: 0.4 ms
Lead Channel Sensing Intrinsic Amplitude: 4 mV
Lead Channel Setting Pacing Amplitude: 2 V
Lead Channel Setting Pacing Pulse Width: 0.4 ms
MDC IDC MSMT LEADCHNL RV IMPEDANCE VALUE: 931 Ohm
MDC IDC MSMT LEADCHNL RV PACING THRESHOLD AMPLITUDE: 0.5 V
MDC IDC MSMT LEADCHNL RV PACING THRESHOLD PULSEWIDTH: 0.4 ms
MDC IDC MSMT LEADCHNL RV SENSING INTR AMPL: 15.68 mV — AB
MDC IDC SET LEADCHNL RA PACING AMPLITUDE: 1.5 V
MDC IDC SET LEADCHNL RV SENSING SENSITIVITY: 2.8 mV
MDC IDC STAT BRADY AS VS PERCENT: 0.2 %

## 2013-06-29 ENCOUNTER — Other Ambulatory Visit: Payer: Self-pay | Admitting: Cardiovascular Disease

## 2013-07-01 NOTE — Telephone Encounter (Signed)
Rx refill sent to patients pharmacy  

## 2013-07-04 ENCOUNTER — Ambulatory Visit: Payer: Medicare Other | Admitting: Internal Medicine

## 2013-07-05 ENCOUNTER — Ambulatory Visit (INDEPENDENT_AMBULATORY_CARE_PROVIDER_SITE_OTHER): Payer: Medicare Other | Admitting: Internal Medicine

## 2013-07-05 ENCOUNTER — Encounter: Payer: Self-pay | Admitting: Internal Medicine

## 2013-07-05 VITALS — BP 138/76 | HR 73 | Ht 71.0 in | Wt 206.7 lb

## 2013-07-05 DIAGNOSIS — I10 Essential (primary) hypertension: Secondary | ICD-10-CM

## 2013-07-05 DIAGNOSIS — I2510 Atherosclerotic heart disease of native coronary artery without angina pectoris: Secondary | ICD-10-CM

## 2013-07-05 DIAGNOSIS — I4892 Unspecified atrial flutter: Secondary | ICD-10-CM

## 2013-07-05 DIAGNOSIS — I509 Heart failure, unspecified: Secondary | ICD-10-CM

## 2013-07-05 DIAGNOSIS — I471 Supraventricular tachycardia: Secondary | ICD-10-CM

## 2013-07-05 MED ORDER — FUROSEMIDE 40 MG PO TABS: 40.0000 mg | ORAL_TABLET | Freq: Every day | ORAL | Status: DC

## 2013-07-05 NOTE — Patient Instructions (Signed)
Your physician wants you to follow-up in:  6 months. You will receive a reminder letter in the mail two months in advance. If you don't receive a letter, please call our office to schedule the follow-up appointment.   

## 2013-07-05 NOTE — Progress Notes (Signed)
OFFICE NOTE  Chief Complaint:  Routine follow-up  Primary Care Physician: Jesus Evener, MD  HPI:  Jesus Oconnor is a 80 year old gentleman who we have been following closely with a history of 5-vessel CABG and atrial flutter in 2012. He has done well with lack of chest pain; however, he says he does have low energy which has been pretty much stable, not necessarily improved after surgery. He also has significant abnormalities in his EKG as well indicating a conduction delay. When his episode of atrial flutter was occurring his ventricular response was only in the 40s. He was also noted on EKG to have trifascicular block and periods of Wenckebach. Today, he actually had some more Wenckebach and couplets as well as continued trifascicular block. He has had paroxysmal A-flutter and we have kept him off a beta blocker for those reasons. I placed him on a monitor which demonstrated significant bradycardia for which she was symptomatic and ultimately underwent a permanent pacemaker. He reports that this has helped him significantly and recently followed up with Jesus Oconnor in the office. The pacemaker was interrogated and apparently is working properly. Jesus Oconnor has no significant complaints other than some right knee pain and itchiness and tenderness along the lower aspect of his midline sternal incision, where he has a keloid scar.  Of note he recently had acute cholecystitis and is status post cholecystectomy.  Jesus Oconnor returns for followup today. He saw Jesus Oconnor last week for pacemaker check and it seems to be working appropriately. He has had some problems with heart failure. He had fluid gain and it turns out that he was not taking his Lasix on a scheduled basis. He since restarted taking that every day and is feeling much better. He has no complaints of shortness of breath or other symptoms.  PMHx:  Past Medical History  Diagnosis Date  . HTN (hypertension)   . Dyslipidemia   . DM2  (diabetes mellitus, type 2)   . COPD (chronic obstructive pulmonary disease)   . CHF (congestive heart failure)   . Hyperlipidemia   . Sore throat   . Cough   . Wheezing   . Leg swelling   . Constipation   . Diarrhea   . Coronary artery disease   . Atrial flutter     severe bradycardia  . Shortness of breath   . Pacemaker 05/22/2012    Jesus Oconnor    Medtronic Adapta  . Mobitz type 1 second degree atrioventricular block   . 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 SVG to firts & second diagonals,SVG to forth posterolateral branch to RCA  . Diastolic dysfunction     Echo 0000000 I diastolic dysfunction,EF 0000000    Past Surgical History  Procedure Laterality Date  . Cardiac catheterization  09/10/2010    severe LAD,D1,D2 & ostial D2  . Cabg x5  09/13/2010    Jesus Oconnor to LAD,SVG to obtuse marginal,seq. SVG to first & second diagonals,SVG to fourth posterolateral branch RCA  . Repair of umbilical hernia.    . Repair of left indirect inguinal hernia with mesh.    . Cholecystectomy  october 2012  . Coronary artery bypass graft    . Insert / replace / remove pacemaker  05/22/2012     dual chamber    FAMHx:  Family History  Problem Relation Age of Onset  . Heart disease Father   . Heart disease Brother  s/p cabg  . Heart disease Maternal Uncle     SOCHx:   reports that he quit smoking about 2 years ago. His smoking use included Cigarettes. He smoked 0.00 packs per day for 40 years. He has never used smokeless tobacco. He reports that he drinks alcohol. He reports that he does not use illicit drugs.  ALLERGIES:  No Known Allergies  ROS: A comprehensive review of systems was negative.  HOME MEDS: Current Outpatient Prescriptions  Medication Sig Dispense Refill  . albuterol (PROVENTIL) (2.5 MG/3ML) 0.083% nebulizer solution Take 2.5 mg by nebulization every 6 (six) hours as needed for wheezing or shortness of breath.      Marland Kitchen  amLODipine (NORVASC) 5 MG tablet TAKE 1 TABLET BY MOUTH ONCE DAILY  30 tablet  6  . aspirin 81 MG tablet Take 81 mg by mouth daily.        . fluticasone (FLONASE) 50 MCG/ACT nasal spray Place 1 spray into both nostrils 2 (two) times daily.  1 g  2  . furosemide (LASIX) 40 MG tablet Take 40 mg by mouth daily.       Marland Kitchen lisinopril (PRINIVIL,ZESTRIL) 10 MG tablet Take 10 mg by mouth 2 (two) times daily.       . metFORMIN (GLUCOPHAGE) 1000 MG tablet Take 500 mg by mouth 2 (two) times daily with a meal.       . metoprolol tartrate (LOPRESSOR) 25 MG tablet TAKE 1 TABLET BY MOUTH TWICE DAILY  60 tablet  6  . nitroGLYCERIN (NITROSTAT) 0.4 MG SL tablet Place 1 tablet (0.4 mg total) under the tongue every 5 (five) minutes as needed for chest pain.  25 tablet  3  . potassium chloride SA (K-DUR,KLOR-CON) 20 MEQ tablet Take 1 tablet (20 mEq total) by mouth daily.  30 tablet  9  . traZODone (DESYREL) 50 MG tablet Take 50-100 mg by mouth at bedtime as needed for sleep.       No current facility-administered medications for this visit.    LABS/IMAGING: No results found for this or any previous visit (from the past 48 hour(s)). No results found.  VITALS: BP 138/76  Pulse 73  Ht 5\' 11"  (1.803 m)  Wt 206 lb 11.2 oz (93.759 kg)  BMI 28.84 kg/m2  EXAM: General appearance: alert and no distress Neck: no carotid bruit and no JVD Lungs: clear to auscultation bilaterally Heart: regular rate and rhythm, S1, S2 normal, no murmur, click, rub or gallop, the pacemaker site is without fluctuance, but is located high in the left shoulder area Abdomen: soft, non-tender; bowel sounds normal; no masses,  no organomegaly Extremities: extremities normal, atraumatic, no cyanosis or edema Pulses: 2+ and symmetric Skin: Skin color, texture, turgor normal. No rashes or lesions or 3" keloid scar at the inferior aspect of his midline incision Neurologic: Grossly normal Psych: Mood, affect normal  EKG: AV dual chamber paced  rhythm at 73  ASSESSMENT: 1. 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 Short episode of paroxysmal atrial fibrillation in the setting of acute cholecystitis, without significant recurrence on aspirin 3. Hypertension-controlled 4. Dyslipidemia-at goal 5. CABG x 5 vessels  PLAN: 1.   Jesus Oconnor is doing well denies any chest pain or palpitations. He appears euvolemic. He recently had blood work through his primary care provider. He denies any chest pain. His device interrogation recently showed normal function. Overall he is doing well and will not change any medications at  this time. Plan to see him back in 6 months.  Pixie Casino, MD, [facility] Attending Cardiologist [place] 07/05/2013, 1:16 PM

## 2013-07-29 ENCOUNTER — Other Ambulatory Visit: Payer: Self-pay | Admitting: Internal Medicine

## 2013-07-30 NOTE — Telephone Encounter (Signed)
Rx was sent to pharmacy electronically. 

## 2013-09-23 ENCOUNTER — Ambulatory Visit (INDEPENDENT_AMBULATORY_CARE_PROVIDER_SITE_OTHER): Payer: Medicare Other | Admitting: *Deleted

## 2013-09-23 ENCOUNTER — Encounter: Payer: Self-pay | Admitting: Cardiovascular Disease

## 2013-09-23 DIAGNOSIS — I471 Supraventricular tachycardia: Secondary | ICD-10-CM

## 2013-09-23 DIAGNOSIS — Z950 Presence of cardiac pacemaker: Secondary | ICD-10-CM

## 2013-09-23 NOTE — Progress Notes (Signed)
Remote pacemaker transmission.   

## 2013-09-27 LAB — MDC_IDC_ENUM_SESS_TYPE_REMOTE
Battery Remaining Longevity: 127 mo
Brady Statistic AP VS Percent: 1 %
Brady Statistic AS VP Percent: 15 %
Date Time Interrogation Session: 20150720164742
Lead Channel Impedance Value: 527 Ohm
Lead Channel Impedance Value: 969 Ohm
Lead Channel Pacing Threshold Amplitude: 0.75 V
Lead Channel Pacing Threshold Pulse Width: 0.4 ms
Lead Channel Setting Sensing Sensitivity: 2.8 mV
MDC IDC MSMT BATTERY IMPEDANCE: 135 Ohm
MDC IDC MSMT BATTERY VOLTAGE: 2.8 V
MDC IDC MSMT LEADCHNL RA PACING THRESHOLD AMPLITUDE: 0.75 V
MDC IDC MSMT LEADCHNL RA PACING THRESHOLD PULSEWIDTH: 0.4 ms
MDC IDC SET LEADCHNL RA PACING AMPLITUDE: 2 V
MDC IDC SET LEADCHNL RV PACING AMPLITUDE: 2.5 V
MDC IDC SET LEADCHNL RV PACING PULSEWIDTH: 0.4 ms
MDC IDC STAT BRADY AP VP PERCENT: 84 %
MDC IDC STAT BRADY AS VS PERCENT: 0 %

## 2013-10-02 ENCOUNTER — Encounter: Payer: Self-pay | Admitting: Cardiology

## 2013-12-26 ENCOUNTER — Encounter: Payer: Self-pay | Admitting: Cardiovascular Disease

## 2013-12-26 ENCOUNTER — Telehealth: Payer: Self-pay | Admitting: Cardiology

## 2013-12-26 ENCOUNTER — Ambulatory Visit (INDEPENDENT_AMBULATORY_CARE_PROVIDER_SITE_OTHER): Payer: Medicare Other | Admitting: *Deleted

## 2013-12-26 DIAGNOSIS — I471 Supraventricular tachycardia: Secondary | ICD-10-CM

## 2013-12-26 DIAGNOSIS — R001 Bradycardia, unspecified: Secondary | ICD-10-CM

## 2013-12-26 NOTE — Telephone Encounter (Signed)
Opened in error

## 2013-12-26 NOTE — Progress Notes (Signed)
Remote pacemaker transmission.   

## 2013-12-30 LAB — MDC_IDC_ENUM_SESS_TYPE_REMOTE
Battery Impedance: 135 Ohm
Battery Remaining Longevity: 125 mo
Brady Statistic AP VP Percent: 87 %
Brady Statistic AS VP Percent: 13 %
Brady Statistic AS VS Percent: 0 %
Lead Channel Impedance Value: 511 Ohm
Lead Channel Impedance Value: 857 Ohm
Lead Channel Pacing Threshold Amplitude: 0.75 V
Lead Channel Pacing Threshold Pulse Width: 0.4 ms
Lead Channel Pacing Threshold Pulse Width: 0.4 ms
Lead Channel Setting Pacing Amplitude: 2 V
Lead Channel Setting Pacing Amplitude: 2.5 V
Lead Channel Setting Pacing Pulse Width: 0.4 ms
Lead Channel Setting Sensing Sensitivity: 2.8 mV
MDC IDC MSMT BATTERY VOLTAGE: 2.79 V
MDC IDC MSMT LEADCHNL RV PACING THRESHOLD AMPLITUDE: 0.5 V
MDC IDC SESS DTM: 20151022142352
MDC IDC STAT BRADY AP VS PERCENT: 0 %

## 2014-01-02 ENCOUNTER — Encounter: Payer: Self-pay | Admitting: Cardiology

## 2014-01-30 ENCOUNTER — Other Ambulatory Visit: Payer: Self-pay | Admitting: Cardiovascular Disease

## 2014-02-13 ENCOUNTER — Encounter (HOSPITAL_COMMUNITY): Payer: Self-pay | Admitting: Cardiovascular Disease

## 2014-03-10 ENCOUNTER — Telehealth: Payer: Self-pay | Admitting: Cardiovascular Disease

## 2014-03-10 MED ORDER — AMLODIPINE BESYLATE 5 MG PO TABS: 5.0000 mg | ORAL_TABLET | Freq: Every day | ORAL | Status: DC

## 2014-03-10 NOTE — Telephone Encounter (Signed)
Reordered medication, notified pt's daughter.

## 2014-03-10 NOTE — Telephone Encounter (Signed)
Daughter called in stating that the pt is out of his Amlodipine and would like it called in to the [place]. Please call  Thanks

## 2014-03-31 ENCOUNTER — Encounter: Payer: Medicare Other | Admitting: *Deleted

## 2014-04-01 ENCOUNTER — Encounter: Payer: Self-pay | Admitting: Cardiology

## 2014-04-02 ENCOUNTER — Telehealth: Payer: Self-pay | Admitting: Cardiovascular Disease

## 2014-04-02 NOTE — Telephone Encounter (Signed)
Informed pt daughter that she can send transmission when she is available to do so and we would add pt to schedule at that time. Pt daughter verbalized understanding.

## 2014-04-02 NOTE — Telephone Encounter (Signed)
New Message       Pt's daughter calling stating that the pt didn't send a remote transmission on 03/31/14 and wants to know when they need to try to do it again. Please call back and advise.

## 2014-06-11 ENCOUNTER — Other Ambulatory Visit: Payer: Self-pay | Admitting: Internal Medicine

## 2014-06-12 NOTE — Telephone Encounter (Signed)
Rx(s) sent to pharmacy electronically.  

## 2014-07-16 ENCOUNTER — Other Ambulatory Visit: Payer: Self-pay | Admitting: Cardiology

## 2014-07-16 NOTE — Telephone Encounter (Signed)
Rx(s) sent to pharmacy electronically. OV 07/22/14

## 2014-07-22 ENCOUNTER — Encounter: Payer: Self-pay | Admitting: Cardiovascular Disease

## 2014-07-30 ENCOUNTER — Other Ambulatory Visit: Payer: Self-pay | Admitting: Internal Medicine

## 2014-07-30 NOTE — Telephone Encounter (Signed)
Rx has been sent to the pharmacy electronically. ° °

## 2014-08-01 ENCOUNTER — Other Ambulatory Visit: Payer: Self-pay | Admitting: Internal Medicine

## 2014-08-01 ENCOUNTER — Telehealth: Payer: Self-pay | Admitting: Internal Medicine

## 2014-08-01 ENCOUNTER — Other Ambulatory Visit: Payer: Self-pay | Admitting: *Deleted

## 2014-08-01 MED ORDER — ATORVASTATIN CALCIUM 10 MG PO TABS: 10.0000 mg | ORAL_TABLET | Freq: Every day | ORAL | Status: DC

## 2014-08-01 NOTE — Telephone Encounter (Signed)
Please call,concerning his Lisinopril.

## 2014-08-01 NOTE — Telephone Encounter (Signed)
Rx(s) sent to pharmacy electronically.  

## 2014-08-01 NOTE — Telephone Encounter (Signed)
LMTCB

## 2014-08-05 ENCOUNTER — Ambulatory Visit (INDEPENDENT_AMBULATORY_CARE_PROVIDER_SITE_OTHER): Payer: Medicare Other | Admitting: Cardiovascular Disease

## 2014-08-05 ENCOUNTER — Encounter: Payer: Self-pay | Admitting: Cardiovascular Disease

## 2014-08-05 VITALS — BP 155/86 | HR 69 | Ht 72.0 in | Wt 209.6 lb

## 2014-08-05 DIAGNOSIS — I471 Supraventricular tachycardia: Secondary | ICD-10-CM

## 2014-08-05 DIAGNOSIS — I441 Atrioventricular block, second degree: Secondary | ICD-10-CM

## 2014-08-05 DIAGNOSIS — Z950 Presence of cardiac pacemaker: Secondary | ICD-10-CM | POA: Diagnosis not present

## 2014-08-05 DIAGNOSIS — I472 Ventricular tachycardia: Secondary | ICD-10-CM

## 2014-08-05 DIAGNOSIS — R739 Hyperglycemia, unspecified: Secondary | ICD-10-CM

## 2014-08-05 DIAGNOSIS — I4892 Unspecified atrial flutter: Secondary | ICD-10-CM | POA: Diagnosis not present

## 2014-08-05 DIAGNOSIS — I4729 Other ventricular tachycardia: Secondary | ICD-10-CM | POA: Insufficient documentation

## 2014-08-05 DIAGNOSIS — E785 Hyperlipidemia, unspecified: Secondary | ICD-10-CM

## 2014-08-05 DIAGNOSIS — I2510 Atherosclerotic heart disease of native coronary artery without angina pectoris: Secondary | ICD-10-CM

## 2014-08-05 LAB — CUP PACEART REMOTE DEVICE CHECK
Battery Remaining Longevity: 116 mo
Brady Statistic AP VP Percent: 86 %
Brady Statistic AS VP Percent: 14 %
Date Time Interrogation Session: 20160531091210
Lead Channel Impedance Value: 535 Ohm
Lead Channel Impedance Value: 906 Ohm
Lead Channel Pacing Threshold Amplitude: 0.5 V
Lead Channel Pacing Threshold Amplitude: 0.5 V
Lead Channel Pacing Threshold Pulse Width: 0.4 ms
Lead Channel Setting Pacing Amplitude: 2 V
Lead Channel Setting Pacing Pulse Width: 0.4 ms
MDC IDC MSMT BATTERY IMPEDANCE: 183 Ohm
MDC IDC MSMT BATTERY VOLTAGE: 2.79 V
MDC IDC MSMT LEADCHNL RV PACING THRESHOLD PULSEWIDTH: 0.4 ms
MDC IDC SET LEADCHNL RV PACING AMPLITUDE: 2.5 V
MDC IDC SET LEADCHNL RV SENSING SENSITIVITY: 2.8 mV
MDC IDC STAT BRADY AP VS PERCENT: 0 %
MDC IDC STAT BRADY AS VS PERCENT: 0 %

## 2014-08-05 LAB — CUP PACEART INCLINIC DEVICE CHECK
Battery Remaining Longevity: 116 mo
Brady Statistic AP VP Percent: 86 %
Brady Statistic AP VS Percent: 0 %
Brady Statistic AS VS Percent: 0 %
Lead Channel Impedance Value: 535 Ohm
Lead Channel Impedance Value: 906 Ohm
Lead Channel Pacing Threshold Pulse Width: 0.4 ms
Lead Channel Pacing Threshold Pulse Width: 0.4 ms
Lead Channel Setting Pacing Amplitude: 2.5 V
Lead Channel Setting Sensing Sensitivity: 2.8 mV
MDC IDC MSMT BATTERY IMPEDANCE: 183 Ohm
MDC IDC MSMT BATTERY VOLTAGE: 2.79 V
MDC IDC MSMT LEADCHNL RA PACING THRESHOLD AMPLITUDE: 0.5 V
MDC IDC MSMT LEADCHNL RV PACING THRESHOLD AMPLITUDE: 0.5 V
MDC IDC SESS DTM: 20160531091342
MDC IDC SET LEADCHNL RA PACING AMPLITUDE: 2 V
MDC IDC SET LEADCHNL RV PACING PULSEWIDTH: 0.4 ms
MDC IDC STAT BRADY AS VP PERCENT: 14 %

## 2014-08-05 MED ORDER — LISINOPRIL 10 MG PO TABS: 10.0000 mg | ORAL_TABLET | Freq: Two times a day (BID) | ORAL | Status: DC

## 2014-08-05 MED ORDER — FUROSEMIDE 40 MG PO TABS: 40.0000 mg | ORAL_TABLET | Freq: Every day | ORAL | Status: DC

## 2014-08-05 NOTE — Patient Instructions (Addendum)
Your physician recommends that you return for lab work in 92 month - FASTING. I will mail you your lab slip closer to that time.  Remote monitoring is used to monitor your pacemaker from home. This monitoring reduces the number of office visits required to check your device to one time per year. It allows [place] to keep an eye on the functioning of your device to ensure it is working properly. You are scheduled for a device check from home on 11-04-2014. You may send your transmission at any time that day. If you have a wireless device, the transmission will be sent automatically. After your physician reviews your transmission, you will receive a postcard with your next transmission date.  Dr Sallyanne Kuster recommends that you schedule a follow-up appointment in 12 months with pacer check. You will receive a reminder letter in the mail two months in advance. If you don't receive a letter, please call our office to schedule the follow-up appointment.

## 2014-08-05 NOTE — Progress Notes (Signed)
Patient ID: Jesus Oconnor, male   DOB: 11/10/1933, 81 y.o.   MRN: [id number]     Cardiology Office Note   Date:  08/05/2014   ID:  ZIDAN Oconnor, DOB 1934/01/20, MRN [id number]  PCP:  Milagros Evener, MD  Cardiologist:  KENNETH Mali HILTY, MD;  Sanda Klein, MD   Chief Complaint  Patient presents with  . Annual Exam    ringing in ears, some bilateral swelling      History of Present Illness: Jesus Oconnor is a 81 y.o. male who presents for pacemaker check. He has a history of symptomatic bradycardia due to second degree AV block as well as atrial tachycardia. He has CAD s/p CABG, HTN, DM and hyperlipidemia. He stopped his statin due to severe joint pain ("so bad he was hopping") 2 weeks ago and reports he has improved.  Device check Medtronic Adapta dual chamber 2014) shows estimated generator longevity of 9.5 years, 86% A pacing, 99.7% V pacing and no atrial fibrillation or flutter. He did have an asymptomatic 14-beat run of NSVT one morning in February (O5))h, probably asleep). He denies syncope, palpitations, dyspnea or angina.   Past Medical History  Diagnosis Date  . HTN (hypertension)   . Dyslipidemia   . DM2 (diabetes mellitus, type 2)   . COPD (chronic obstructive pulmonary disease)   . CHF (congestive heart failure)   . Hyperlipidemia   . Sore throat   . Cough   . Wheezing   . Leg swelling   . Constipation   . Diarrhea   . Coronary artery disease   . Atrial flutter     severe bradycardia  . Shortness of breath   . Pacemaker 05/22/2012    Dr  Laban Orourke    Medtronic Adapta  . Mobitz type 1 second degree atrioventricular block   . 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 SVG to firts & second diagonals,SVG to forth posterolateral branch to RCA  . Diastolic dysfunction     Echo 0000000 I diastolic dysfunction,EF 0000000    Past Surgical History  Procedure Laterality Date  . Cardiac catheterization  09/10/2010    severe  LAD,D1,D2 & ostial D2  . Cabg x5  09/13/2010    Dr Farrel Conners to LAD,SVG to obtuse marginal,seq. SVG to first & second diagonals,SVG to fourth posterolateral branch RCA  . Repair of umbilical hernia.    . Repair of left indirect inguinal hernia with mesh.    . Cholecystectomy  october 2012  . Coronary artery bypass graft    . Insert / replace / remove pacemaker  05/22/2012     dual chamber  . Permanent pacemaker insertion N/A 05/22/2012    Procedure: PERMANENT PACEMAKER INSERTION;  Surgeon: Sanda Klein, MD;  Location: [facility];  Service: Cardiovascular;  Laterality: N/A;     Current Outpatient Prescriptions  Medication Sig Dispense Refill  . albuterol (PROVENTIL) (2.5 MG/3ML) 0.083% nebulizer solution Take 2.5 mg by nebulization every 6 (six) hours as needed for wheezing or shortness of breath.    Marland Kitchen amLODipine (NORVASC) 5 MG tablet Take 1 tablet (5 mg total) by mouth daily. 90 tablet 3  . aspirin 81 MG tablet Take 81 mg by mouth daily.      . fluticasone (FLONASE) 50 MCG/ACT nasal spray Place 1 spray into both nostrils 2 (two) times daily. 1 g 2  . furosemide (LASIX) 40 MG tablet Take 1 tablet (40 mg total) by mouth daily. [place]  tablet 3  . metFORMIN (GLUCOPHAGE) 1000 MG tablet Take 500 mg by mouth 2 (two) times daily with a meal.     . metoprolol tartrate (LOPRESSOR) 25 MG tablet TAKE 1 TABLET BY MOUTH TWICE DAILY 60 tablet 2  . nitroGLYCERIN (NITROSTAT) 0.4 MG SL tablet Place 1 tablet (0.4 mg total) under the tongue every 5 (five) minutes as needed for chest pain. 25 tablet 3  . potassium chloride SA (K-DUR,KLOR-CON) 20 MEQ tablet TAKE 1 TABLET BY MOUTH DAILY 30 tablet 0  . traZODone (DESYREL) 50 MG tablet Take 50-100 mg by mouth at bedtime as needed for sleep.    Marland Kitchen lisinopril (PRINIVIL,ZESTRIL) 10 MG tablet Take 1 tablet (10 mg total) by mouth 2 (two) times daily. 180 tablet 3  . ONETOUCH VERIO test strip daily. As directed  4   No current facility-administered medications for this  visit.    Allergies:   Review of patient's allergies indicates no known allergies.    Social History:  The patient  reports that he quit smoking about 4 years ago. His smoking use included Cigarettes. He quit after 40 years of use. He has never used smokeless tobacco. He reports that he drinks alcohol. He reports that he does not use illicit drugs.   Family History:  The patient's family history includes Heart disease in his brother, father, and maternal uncle.    ROS:  Please see the history of present illness.    Otherwise, review of systems positive for none.   All other systems are reviewed and negative.    PHYSICAL EXAM: VS:  BP 155/86 mmHg  Pulse 69  Ht 6' (1.829 m)  Wt 95.074 kg (209 lb 9.6 oz)  BMI 28.42 kg/m2 , BMI Body mass index is 28.42 kg/(m^2).  General: Alert, oriented x3, no distress Head: no evidence of trauma, PERRL, EOMI, no exophtalmos or lid lag, no myxedema, no xanthelasma; normal ears, nose and oropharynx Neck: normal jugular venous pulsations and no hepatojugular reflux; brisk carotid pulses without delay and no carotid bruits Chest: clear to auscultation, no signs of consolidation by percussion or palpation, normal fremitus, symmetrical and full respiratory excursions Cardiovascular: normal position and quality of the apical impulse, regular rhythm, normal first and second heart sounds, no murmurs, rubs or gallops Abdomen: no tenderness or distention, no masses by palpation, no abnormal pulsatility or arterial bruits, normal bowel sounds, no hepatosplenomegaly Extremities: no clubbing, cyanosis or edema; 2+ radial, ulnar and brachial pulses bilaterally; 2+ right femoral, posterior tibial and dorsalis pedis pulses; 2+ left femoral, posterior tibial and dorsalis pedis pulses; no subclavian or femoral bruits Neurological: grossly nonfocal Psych: euthymic mood, full affect   EKG:  EKG is ordered today. The ekg ordered today demonstrates ApVp rhythm    Lipid  Panel    Component Value Date/Time   CHOL 176 09/10/2010 0623   TRIG 94 09/10/2010 0623   HDL 50 09/10/2010 0623   CHOLHDL 3.5 09/10/2010 0623   VLDL 19 09/10/2010 0623   LDLCALC 107* 09/10/2010 0623      Wt Readings from Last 3 Encounters:  08/05/14 95.074 kg (209 lb 9.6 oz)  07/05/13 93.759 kg (206 lb 11.2 oz)  06/21/13 93.804 kg (206 lb 12.8 oz)       ASSESSMENT AND PLAN:  1. Normal dual chamber pacemaker function (sinus bradycardia and second degree AV block)  2. CAD s/p CABG - asymptomatic  3. HTN  4. Hyperlipidemia - discussed the fact that we need to identify a  long term treatment for his lipids to avoid recurrent coronary problems. After 1-2 months off atorvastatin, recommend we re-challenge since there are other likely causes for his knee pain.  5. NSVT - a single asymptomatic episode, he is on beta blockers   Current medicines are reviewed at length with the patient today.  The patient does not have concerns regarding medicines.  The following changes have been made:  no change  Labs/ tests ordered today include:  Orders Placed This Encounter  Procedures  . Lipid panel  . Hemoglobin A1c  . Implantable device - remote  . Implantable device check  . EKG 12-Lead    Patient Instructions  Your physician recommends that you return for lab work in 1 month - FASTING. I will mail you your lab slip closer to that time.  Remote monitoring is used to monitor your pacemaker from home. This monitoring reduces the number of office visits required to check your device to one time per year. It allows [place] to keep an eye on the functioning of your device to ensure it is working properly. You are scheduled for a device check from home on 11-04-2014. You may send your transmission at any time that day. If you have a wireless device, the transmission will be sent automatically. After your physician reviews your transmission, you will receive a postcard with your next transmission  date.  Dr Sallyanne Kuster recommends that you schedule a follow-up appointment in 12 months with pacer check. You will receive a reminder letter in the mail two months in advance. If you don't receive a letter, please call our office to schedule the follow-up appointment.    Mikael Spray, MD  08/05/2014 9:12 PM    Sanda Klein, MD, [facility] [phone] office [phone] pager

## 2014-08-06 ENCOUNTER — Other Ambulatory Visit: Payer: Self-pay | Admitting: *Deleted

## 2014-08-06 MED ORDER — FUROSEMIDE 40 MG PO TABS: 40.0000 mg | ORAL_TABLET | Freq: Every day | ORAL | Status: DC

## 2014-08-06 MED ORDER — METOPROLOL TARTRATE 25 MG PO TABS: 25.0000 mg | ORAL_TABLET | Freq: Two times a day (BID) | ORAL | Status: DC

## 2014-08-06 MED ORDER — AMLODIPINE BESYLATE 5 MG PO TABS: 5.0000 mg | ORAL_TABLET | Freq: Every day | ORAL | Status: DC

## 2014-08-19 ENCOUNTER — Encounter: Payer: Self-pay | Admitting: Cardiovascular Disease

## 2014-09-01 ENCOUNTER — Other Ambulatory Visit: Payer: Self-pay | Admitting: Cardiology

## 2014-09-15 ENCOUNTER — Telehealth: Payer: Self-pay | Admitting: Cardiovascular Disease

## 2014-09-15 NOTE — Telephone Encounter (Signed)
Pt's daughter is calling in to inform the doctor that she will be switching his pharmacy to [facility]. Please call if need to.   Thanks

## 2014-09-15 NOTE — Telephone Encounter (Signed)
[facility] updated as preferred pharmacy. No further action needed.

## 2014-09-19 ENCOUNTER — Other Ambulatory Visit: Payer: Self-pay | Admitting: *Deleted

## 2014-11-04 ENCOUNTER — Ambulatory Visit (INDEPENDENT_AMBULATORY_CARE_PROVIDER_SITE_OTHER): Payer: Medicare Other | Admitting: *Deleted

## 2014-11-04 ENCOUNTER — Telehealth: Payer: Self-pay | Admitting: Cardiology

## 2014-11-04 DIAGNOSIS — I441 Atrioventricular block, second degree: Secondary | ICD-10-CM

## 2014-11-04 NOTE — Telephone Encounter (Signed)
LMOVM reminding pt to send remote transmission.   

## 2014-11-05 ENCOUNTER — Encounter: Payer: Self-pay | Admitting: Cardiology

## 2014-11-05 ENCOUNTER — Encounter: Payer: Self-pay | Admitting: Cardiovascular Disease

## 2014-11-05 DIAGNOSIS — I441 Atrioventricular block, second degree: Secondary | ICD-10-CM | POA: Diagnosis not present

## 2014-11-06 NOTE — Progress Notes (Signed)
Remote pacemaker transmission.   

## 2014-11-13 LAB — CUP PACEART REMOTE DEVICE CHECK
Brady Statistic AP VP Percent: 87.7 %
Brady Statistic AP VS Percent: 0.2 %
Brady Statistic AS VP Percent: 12.1 %
Brady Statistic AS VS Percent: 0.1 %
Date Time Interrogation Session: 20160908145201
Lead Channel Pacing Threshold Pulse Width: 0.4 ms
Lead Channel Pacing Threshold Pulse Width: 0.4 ms
Lead Channel Setting Pacing Amplitude: 2 V
Lead Channel Setting Pacing Amplitude: 2.5 V
Lead Channel Setting Pacing Pulse Width: 0.4 ms
Lead Channel Setting Sensing Sensitivity: 2.8 mV
MDC IDC MSMT LEADCHNL RA IMPEDANCE VALUE: 520 Ohm
MDC IDC MSMT LEADCHNL RA PACING THRESHOLD AMPLITUDE: 0.5 V
MDC IDC MSMT LEADCHNL RV IMPEDANCE VALUE: 931 Ohm
MDC IDC MSMT LEADCHNL RV PACING THRESHOLD AMPLITUDE: 0.5 V

## 2014-11-21 ENCOUNTER — Encounter: Payer: Self-pay | Admitting: Cardiology

## 2015-02-05 ENCOUNTER — Ambulatory Visit (INDEPENDENT_AMBULATORY_CARE_PROVIDER_SITE_OTHER): Payer: Medicare Other | Admitting: *Deleted

## 2015-02-05 ENCOUNTER — Telehealth: Payer: Self-pay | Admitting: Cardiology

## 2015-02-05 DIAGNOSIS — I441 Atrioventricular block, second degree: Secondary | ICD-10-CM | POA: Diagnosis not present

## 2015-02-05 NOTE — Telephone Encounter (Signed)
Spoke with pt and reminded pt of remote transmission that is due today. Pt verbalized understanding.   

## 2015-02-06 ENCOUNTER — Encounter: Payer: Self-pay | Admitting: Cardiology

## 2015-02-06 NOTE — Progress Notes (Signed)
Remote pacemaker transmission.   

## 2015-02-18 LAB — CUP PACEART REMOTE DEVICE CHECK
Battery Impedance: 183 Ohm
Battery Voltage: 2.79 V
Brady Statistic AP VP Percent: 89 %
Brady Statistic AS VP Percent: 11 %
Date Time Interrogation Session: 20161202142704
Implantable Lead Implant Date: 20140318
Implantable Lead Location: 753860
Implantable Lead Model: 5076
Lead Channel Impedance Value: 998 Ohm
Lead Channel Setting Pacing Amplitude: 2 V
Lead Channel Setting Pacing Pulse Width: 0.4 ms
Lead Channel Setting Sensing Sensitivity: 2.8 mV
MDC IDC LEAD IMPLANT DT: 20140318
MDC IDC LEAD LOCATION: 753859
MDC IDC MSMT BATTERY REMAINING LONGEVITY: 118 mo
MDC IDC MSMT LEADCHNL RA IMPEDANCE VALUE: 569 Ohm
MDC IDC SET LEADCHNL RV PACING AMPLITUDE: 2.5 V
MDC IDC STAT BRADY AP VS PERCENT: 0 %
MDC IDC STAT BRADY AS VS PERCENT: 0 %

## 2015-02-19 ENCOUNTER — Encounter: Payer: Self-pay | Admitting: Cardiology

## 2015-04-30 ENCOUNTER — Other Ambulatory Visit (INDEPENDENT_AMBULATORY_CARE_PROVIDER_SITE_OTHER): Payer: Self-pay | Admitting: Otolaryngology

## 2015-04-30 DIAGNOSIS — H9190 Unspecified hearing loss, unspecified ear: Secondary | ICD-10-CM

## 2015-05-07 ENCOUNTER — Ambulatory Visit: Payer: Medicare Other | Admitting: *Deleted

## 2015-05-07 ENCOUNTER — Telehealth: Payer: Self-pay | Admitting: Cardiology

## 2015-05-07 NOTE — Telephone Encounter (Signed)
Spoke with pt and reminded pt of remote transmission that is due today. Pt verbalized understanding.   

## 2015-05-08 ENCOUNTER — Encounter: Payer: Self-pay | Admitting: Cardiology

## 2015-05-11 ENCOUNTER — Ambulatory Visit (INDEPENDENT_AMBULATORY_CARE_PROVIDER_SITE_OTHER): Payer: Medicare Other | Admitting: *Deleted

## 2015-05-11 DIAGNOSIS — I441 Atrioventricular block, second degree: Secondary | ICD-10-CM

## 2015-05-11 LAB — CUP PACEART REMOTE DEVICE CHECK
Battery Impedance: 183 Ohm
Battery Impedance: 183 Ohm
Battery Remaining Longevity: 116 mo
Battery Voltage: 2.8 V
Brady Statistic AP VS Percent: 0 %
Brady Statistic AS VP Percent: 10 %
Brady Statistic AS VS Percent: 0 %
Brady Statistic AS VS Percent: 0 %
Date Time Interrogation Session: 20170306181653
Implantable Lead Implant Date: 20140318
Implantable Lead Implant Date: 20140318
Implantable Lead Location: 753859
Implantable Lead Location: 753860
Implantable Lead Location: 753860
Implantable Lead Model: 5076
Implantable Lead Model: 5076
Implantable Lead Model: 5076
Lead Channel Impedance Value: 963 Ohm
Lead Channel Impedance Value: 963 Ohm
Lead Channel Pacing Threshold Amplitude: 0.625 V
Lead Channel Pacing Threshold Pulse Width: 0.4 ms
Lead Channel Setting Pacing Amplitude: 2 V
Lead Channel Setting Pacing Amplitude: 2.5 V
Lead Channel Setting Pacing Amplitude: 2.5 V
Lead Channel Setting Sensing Sensitivity: 2.8 mV
Lead Channel Setting Sensing Sensitivity: 2.8 mV
MDC IDC LEAD IMPLANT DT: 20140318
MDC IDC LEAD IMPLANT DT: 20140318
MDC IDC LEAD LOCATION: 753859
MDC IDC MSMT BATTERY REMAINING LONGEVITY: 116 mo
MDC IDC MSMT BATTERY VOLTAGE: 2.8 V
MDC IDC MSMT LEADCHNL RA IMPEDANCE VALUE: 512 Ohm
MDC IDC MSMT LEADCHNL RA IMPEDANCE VALUE: 512 Ohm
MDC IDC MSMT LEADCHNL RA PACING THRESHOLD AMPLITUDE: 0.625 V
MDC IDC MSMT LEADCHNL RA PACING THRESHOLD PULSEWIDTH: 0.4 ms
MDC IDC MSMT LEADCHNL RV PACING THRESHOLD AMPLITUDE: 0.5 V
MDC IDC MSMT LEADCHNL RV PACING THRESHOLD AMPLITUDE: 0.5 V
MDC IDC MSMT LEADCHNL RV PACING THRESHOLD PULSEWIDTH: 0.4 ms
MDC IDC MSMT LEADCHNL RV PACING THRESHOLD PULSEWIDTH: 0.4 ms
MDC IDC SESS DTM: 20170306181653
MDC IDC SET LEADCHNL RA PACING AMPLITUDE: 2 V
MDC IDC SET LEADCHNL RV PACING PULSEWIDTH: 0.4 ms
MDC IDC SET LEADCHNL RV PACING PULSEWIDTH: 0.4 ms
MDC IDC STAT BRADY AP VP PERCENT: 90 %
MDC IDC STAT BRADY AP VP PERCENT: 90 %
MDC IDC STAT BRADY AP VS PERCENT: 0 %
MDC IDC STAT BRADY AS VP PERCENT: 10 %

## 2015-05-14 ENCOUNTER — Ambulatory Visit
Admission: RE | Admit: 2015-05-14 | Discharge: 2015-05-14 | Disposition: A | Discharge status: Home / Self Care | Payer: Medicare Other | Source: Ambulatory Visit | Attending: Otolaryngology | Admitting: Otolaryngology

## 2015-05-14 ENCOUNTER — Telehealth: Payer: Self-pay | Admitting: Cardiovascular Disease

## 2015-05-14 DIAGNOSIS — H9190 Unspecified hearing loss, unspecified ear: Secondary | ICD-10-CM

## 2015-05-14 NOTE — Telephone Encounter (Signed)
Informed daughter that remote was received. Daughter voiced understanding.

## 2015-05-14 NOTE — Telephone Encounter (Signed)
New Message  Pt dtr calling to see if most recent remote was sent successfully. Please call back and discuss.

## 2015-05-18 NOTE — Progress Notes (Signed)
Remote pacemaker transmission.   

## 2015-05-21 LAB — CUP PACEART REMOTE DEVICE CHECK
Battery Remaining Longevity: 116 mo
Brady Statistic AP VS Percent: 0 %
Brady Statistic AS VP Percent: 10 %
Date Time Interrogation Session: 20170306181653
Implantable Lead Implant Date: 20140318
Implantable Lead Location: 753859
Implantable Lead Location: 753860
Implantable Lead Model: 5076
Implantable Lead Model: 5076
Lead Channel Pacing Threshold Pulse Width: 0.4 ms
Lead Channel Pacing Threshold Pulse Width: 0.4 ms
Lead Channel Setting Pacing Amplitude: 2 V
Lead Channel Setting Pacing Amplitude: 2.5 V
Lead Channel Setting Pacing Pulse Width: 0.4 ms
Lead Channel Setting Sensing Sensitivity: 2.8 mV
MDC IDC LEAD IMPLANT DT: 20140318
MDC IDC MSMT BATTERY IMPEDANCE: 183 Ohm
MDC IDC MSMT BATTERY VOLTAGE: 2.8 V
MDC IDC MSMT LEADCHNL RA IMPEDANCE VALUE: 512 Ohm
MDC IDC MSMT LEADCHNL RA PACING THRESHOLD AMPLITUDE: 0.625 V
MDC IDC MSMT LEADCHNL RV IMPEDANCE VALUE: 963 Ohm
MDC IDC MSMT LEADCHNL RV PACING THRESHOLD AMPLITUDE: 0.5 V
MDC IDC STAT BRADY AP VP PERCENT: 90 %
MDC IDC STAT BRADY AS VS PERCENT: 0 %

## 2015-07-07 ENCOUNTER — Ambulatory Visit (INDEPENDENT_AMBULATORY_CARE_PROVIDER_SITE_OTHER): Payer: Medicare Other | Admitting: Cardiovascular Disease

## 2015-07-07 ENCOUNTER — Encounter: Payer: Self-pay | Admitting: Cardiovascular Disease

## 2015-07-07 VITALS — BP 142/72 | HR 72 | Ht 72.0 in | Wt 217.8 lb

## 2015-07-07 DIAGNOSIS — I10 Essential (primary) hypertension: Secondary | ICD-10-CM

## 2015-07-07 DIAGNOSIS — I441 Atrioventricular block, second degree: Secondary | ICD-10-CM | POA: Diagnosis not present

## 2015-07-07 DIAGNOSIS — I5032 Chronic diastolic (congestive) heart failure: Secondary | ICD-10-CM | POA: Diagnosis not present

## 2015-07-07 DIAGNOSIS — I2510 Atherosclerotic heart disease of native coronary artery without angina pectoris: Secondary | ICD-10-CM | POA: Diagnosis not present

## 2015-07-07 DIAGNOSIS — Z950 Presence of cardiac pacemaker: Secondary | ICD-10-CM

## 2015-07-07 DIAGNOSIS — I4729 Other ventricular tachycardia: Secondary | ICD-10-CM

## 2015-07-07 DIAGNOSIS — I472 Ventricular tachycardia: Secondary | ICD-10-CM | POA: Diagnosis not present

## 2015-07-07 DIAGNOSIS — I471 Supraventricular tachycardia: Secondary | ICD-10-CM

## 2015-07-07 DIAGNOSIS — R0602 Shortness of breath: Secondary | ICD-10-CM

## 2015-07-07 DIAGNOSIS — E785 Hyperlipidemia, unspecified: Secondary | ICD-10-CM

## 2015-07-07 LAB — CUP PACEART INCLINIC DEVICE CHECK
Battery Impedance: 206 Ohm
Brady Statistic AP VP Percent: 90 %
Date Time Interrogation Session: 20170502084658
Implantable Lead Implant Date: 20140318
Implantable Lead Location: 753860
Implantable Lead Model: 5076
Lead Channel Impedance Value: 906 Ohm
Lead Channel Pacing Threshold Amplitude: 0.625 V
Lead Channel Pacing Threshold Pulse Width: 0.4 ms
Lead Channel Pacing Threshold Pulse Width: 0.4 ms
Lead Channel Setting Sensing Sensitivity: 2.8 mV
MDC IDC LEAD IMPLANT DT: 20140318
MDC IDC LEAD LOCATION: 753859
MDC IDC MSMT BATTERY REMAINING LONGEVITY: 112 mo
MDC IDC MSMT BATTERY VOLTAGE: 2.79 V
MDC IDC MSMT LEADCHNL RA IMPEDANCE VALUE: 527 Ohm
MDC IDC MSMT LEADCHNL RV PACING THRESHOLD AMPLITUDE: 0.625 V
MDC IDC SET LEADCHNL RA PACING AMPLITUDE: 2 V
MDC IDC SET LEADCHNL RV PACING AMPLITUDE: 2.5 V
MDC IDC SET LEADCHNL RV PACING PULSEWIDTH: 0.4 ms
MDC IDC STAT BRADY AP VS PERCENT: 0 %
MDC IDC STAT BRADY AS VP PERCENT: 10 %
MDC IDC STAT BRADY AS VS PERCENT: 0 %

## 2015-07-07 NOTE — Patient Instructions (Signed)
Medication Instructions:  Your physician recommends that you continue on your current medications as directed. Please refer to the Current Medication list given to you today.   Labwork: Your physician recommends that you return for lab work AT [place]. DO NOT EAT OR DRINK AFTER MIDNIGHT. The lab can be found on the FIRST FLOOR of out building in [address]   Testing/Procedures: Your physician has requested that you have an echocardiogram. Echocardiography is a painless test that uses sound waves to create images of your heart. It provides your doctor with information about the size and shape of your heart and how well your heart's chambers and valves are working. This procedure takes approximately one hour. There are no restrictions for this procedure.    Follow-Up: Remote monitoring is used to monitor your Pacemaker of ICD from home. This monitoring reduces the number of office visits required to check your device to one time per year. It allows [place] to keep an eye on the functioning of your device to ensure it is working properly. You are scheduled for a device check from home on Thursday, October 08, 2015. You may send your transmission at any time that day. If you have a wireless device, the transmission will be sent automatically. After your physician reviews your transmission, you will receive a postcard with your next transmission date.  Your physician wants you to follow-up in: [place]. You will receive a reminder letter in the mail two months in advance. If you don't receive a letter, please call our office to schedule the follow-up appointment.      Any Other Special Instructions Will Be Listed Below (If Applicable).     If you need a refill on your cardiac medications before your next appointment, please call your pharmacy.

## 2015-07-07 NOTE — Progress Notes (Signed)
Patient ID: Jesus Oconnor, male   DOB: 1934-02-18, 82 y.o.   MRN: [id number]     Cardiology Office Note    Date:  07/07/2015   ID:  Jesus Oconnor, DOB [birth year], MRN [id number]  PCP:  Aretta Nip, MD  Cardiologist:   Sanda Klein, MD   No chief complaint on file.   History of Present Illness:  Jesus Oconnor is a 82 y.o. 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 He has had repeated problems with paroxysmal atrial tachycardia (possible brief flutter) and his pacemaker has recorded occasional nonsustained ventricular tachycardia.  He has not seen Dr. Debara Pickett since May 2015. This is his first office visit since he saw me for a pacemaker check in May 2016.  He has no significant cardiovascular complaints. Occasionally, his ankles will swell and his socks leave marks, but he denies exertional dyspnea, angina, syncope, dizziness, palpitations, claudication, focal neurological deficits or other cardiac vascular complaints. His major limitations are related to severe bilateral knee pain. He can only walk upstairs walking sideways due to the pain. His knees are swollen. He is due to see an orthopedic specialist in the next couple of weeks.  Normal pacemaker check performed today. This showed battery longevity estimated at almost 10 years, 90% atrial pacing, 100% ventricular pacing, burden of atrial mode switch less so than 0.1% with the longest episode lasting for just under an hour. He appears to have very frequent PACs and bursts of paroxysmal atrial tachycardia rather than true atrial fibrillation. No significant atrial arrhythmia has been recorded since last December. As before he has a rare and infrequent episodes of  nonsustained ventricular tachycardia, most recently a 6 second episode on February 16 at 5 AM.  Past Medical History  Diagnosis Date  . HTN (hypertension)   . Dyslipidemia   . DM2 (diabetes mellitus, type 2)   . COPD (chronic obstructive pulmonary disease)   . CHF (congestive heart failure)   . Hyperlipidemia   . Sore throat   . Cough   . Wheezing   . Leg swelling   . Constipation   . Diarrhea   . Coronary artery disease   . Atrial flutter     severe bradycardia  . Shortness of breath   . Pacemaker 05/22/2012    Dr  Trai Ells    Medtronic Adapta  . Mobitz type 1 second degree atrioventricular block   . 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 SVG to firts & second diagonals,SVG to forth posterolateral branch to RCA  . Diastolic dysfunction     Echo 0000000 I diastolic dysfunction,EF 0000000    Past Surgical History  Procedure Laterality Date  . Cardiac catheterization  09/10/2010    severe LAD,D1,D2 & ostial D2  . Cabg x5  09/13/2010    Dr Farrel Conners to LAD,SVG to obtuse marginal,seq. SVG to first & second diagonals,SVG to fourth posterolateral branch RCA  . Repair of umbilical hernia.    . Repair of left indirect inguinal hernia with mesh.    . Cholecystectomy  october 2012  . Coronary artery bypass graft    . Insert / replace / remove pacemaker  05/22/2012     dual chamber  . Permanent pacemaker insertion N/A 05/22/2012    Procedure:  PERMANENT PACEMAKER INSERTION;  Surgeon: Sanda Klein, MD;  Location: [facility];  Service: Cardiovascular;  Laterality: N/A;    Current Medications: Outpatient Prescriptions Prior to Visit  Medication Sig Dispense Refill  . albuterol (PROVENTIL) (2.5 MG/3ML) 0.083% nebulizer solution Take 2.5 mg by nebulization every 6 (six) hours as needed for wheezing or shortness of breath.    Marland Kitchen amLODipine (NORVASC) 5 MG tablet Take 1 tablet (5 mg total) by mouth daily. 90 tablet 3  . aspirin 81 MG tablet Take 81 mg by  mouth daily.      . fluticasone (FLONASE) 50 MCG/ACT nasal spray Place 1 spray into both nostrils 2 (two) times daily. 1 g 2  . furosemide (LASIX) 40 MG tablet Take 1 tablet (40 mg total) by mouth daily. 90 tablet 3  . lisinopril (PRINIVIL,ZESTRIL) 10 MG tablet Take 1 tablet (10 mg total) by mouth 2 (two) times daily. 180 tablet 3  . metFORMIN (GLUCOPHAGE) 1000 MG tablet Take 500 mg by mouth 2 (two) times daily with a meal.     . metoprolol tartrate (LOPRESSOR) 25 MG tablet Take 1 tablet (25 mg total) by mouth 2 (two) times daily. 180 tablet 3  . nitroGLYCERIN (NITROSTAT) 0.4 MG SL tablet Place 1 tablet (0.4 mg total) under the tongue every 5 (five) minutes as needed for chest pain. 25 tablet 3  . ONETOUCH VERIO test strip daily. As directed  4  . potassium chloride SA (K-DUR,KLOR-CON) 20 MEQ tablet TAKE 1 TABLET BY MOUTH DAILY 30 tablet 10  . traZODone (DESYREL) 50 MG tablet Take 50-100 mg by mouth at bedtime as needed for sleep.     No facility-administered medications prior to visit.     Allergies:   Review of patient's allergies indicates no known allergies.   Social History   Social History  . Marital Status: Married    Spouse Name: N/A  . Number of Children: 2  . Years of Education: N/A   Social History Main Topics  . Smoking status: Former Smoker -- 40 years    Types: Cigarettes    Quit date: 08/06/2010  . Smokeless tobacco: Never Used  . Alcohol Use: Yes     Comment: occasional  . Drug Use: No  . Sexual Activity: Not on file   Other Topics Concern  . Not on file   Social History Narrative     Family History:  The patient's family history includes Heart disease in his brother, father, and maternal uncle.   ROS:   Please see the history of present illness.    ROS All other systems reviewed and are negative.   PHYSICAL EXAM:   VS:  There were no vitals taken for this visit.   GEN: Well nourished, well developed, in no acute distress HEENT: normal Neck: no JVD,  carotid bruits, or masses Cardiac: Paradoxically split S2 RRR; no murmurs, rubs, or gallops,no edema , healthy left subclavian pacemaker site Respiratory:  clear to auscultation bilaterally, normal work of breathing GI: soft, nontender, nondistended, + BS MS: no deformity or atrophy Skin: warm and dry, no rash Neuro:  Alert and Oriented x 3, Strength and sensation are intact Psych: euthymic mood, full affect  Wt Readings from Last 3 Encounters:  08/05/14 95.074 kg (209 lb 9.6 oz)  07/05/13 93.759 kg (206 lb 11.2 oz)  06/21/13 93.804 kg (206 lb 12.8 oz)      Studies/Labs Reviewed:   EKG:  EKG is ordered today.  The ekg ordered today  demonstrates AV sequential pacing  Recent Labs: No results found for requested labs within last 365 days.   Lipid Panel    Component Value Date/Time   CHOL 176 09/10/2010 0623   TRIG 94 09/10/2010 0623   HDL 50 09/10/2010 0623   CHOLHDL 3.5 09/10/2010 0623   VLDL 19 09/10/2010 0623   LDLCALC 107* 09/10/2010 0623     ASSESSMENT:    1. Chronic diastolic heart failure ([place])   2. Coronary artery disease involving native coronary artery of native heart without angina pectoris   3. NSVT (nonsustained ventricular tachycardia) ([place])   4. PAT (paroxysmal atrial tachycardia) ([place])   5. Essential hypertension   6. Pacemaker   7. Dyslipidemia      PLAN:  In order of problems listed above:  1. CHF: Until recently seemed to be well compensated on a low dose of loop diuretic. He has a little bit of edema, but I think his knee swelling is due to arthritis. Left ventricular systolic function has not been assessed in several years. Check echo. 2. CAD s/p CABG: 5 years after surgery, he remains asymptomatic. 3. NSVT: Brief and asymptomatic 4. PAT: Overall burden is low, not symptomatic 5. HTN: Borderline elevated today, coming down as the office appointment progressed 6. PPM: Normal device function, unable to avoid ventricular pacing due to high-grade  AV block. May be a candidate for CRT if LVEF declines in the future 7. HLP: He stopped statin due to joint side effects. Consider trying a different agent. Get updated labs    Medication Adjustments/Labs and Tests Ordered: Current medicines are reviewed at length with the patient today.  Concerns regarding medicines are outlined above.  Medication changes, Labs and Tests ordered today are listed in the Patient Instructions below. There are no Patient Instructions on file for this visit.   Mikael Spray, MD  07/07/2015 8:05 AM    [facility] [address], [place], [place]  [address] Phone: [phone]; Fax: [phone]

## 2015-07-16 ENCOUNTER — Other Ambulatory Visit: Payer: Self-pay | Admitting: Cardiovascular Disease

## 2015-07-17 ENCOUNTER — Ambulatory Visit: Payer: Medicare Other | Admitting: Internal Medicine

## 2015-07-21 ENCOUNTER — Other Ambulatory Visit: Payer: Self-pay

## 2015-07-21 ENCOUNTER — Ambulatory Visit (HOSPITAL_COMMUNITY): Payer: Medicare Other | Attending: Cardiology

## 2015-07-21 DIAGNOSIS — R0600 Dyspnea, unspecified: Secondary | ICD-10-CM | POA: Diagnosis present

## 2015-07-21 DIAGNOSIS — I471 Supraventricular tachycardia: Secondary | ICD-10-CM | POA: Diagnosis not present

## 2015-07-21 DIAGNOSIS — I119 Hypertensive heart disease without heart failure: Secondary | ICD-10-CM | POA: Diagnosis not present

## 2015-07-21 DIAGNOSIS — R0602 Shortness of breath: Secondary | ICD-10-CM | POA: Diagnosis not present

## 2015-07-21 DIAGNOSIS — E119 Type 2 diabetes mellitus without complications: Secondary | ICD-10-CM | POA: Diagnosis not present

## 2015-07-21 DIAGNOSIS — I472 Ventricular tachycardia: Secondary | ICD-10-CM | POA: Diagnosis not present

## 2015-07-21 DIAGNOSIS — I77810 Thoracic aortic ectasia: Secondary | ICD-10-CM | POA: Insufficient documentation

## 2015-07-21 DIAGNOSIS — E785 Hyperlipidemia, unspecified: Secondary | ICD-10-CM | POA: Insufficient documentation

## 2015-07-21 DIAGNOSIS — I340 Nonrheumatic mitral (valve) insufficiency: Secondary | ICD-10-CM | POA: Insufficient documentation

## 2015-07-21 DIAGNOSIS — I071 Rheumatic tricuspid insufficiency: Secondary | ICD-10-CM | POA: Insufficient documentation

## 2015-07-23 ENCOUNTER — Other Ambulatory Visit: Payer: Self-pay | Admitting: Cardiovascular Disease

## 2015-07-23 ENCOUNTER — Telehealth: Payer: Self-pay | Admitting: Internal Medicine

## 2015-07-23 NOTE — Telephone Encounter (Signed)
Rx request sent to pharmacy.  

## 2015-07-23 NOTE — Telephone Encounter (Signed)
Left message for pam, no DPR to talk to her. Ask for her to have the pt call to give permission to answer her questions.

## 2015-07-23 NOTE — Telephone Encounter (Signed)
Pt's dtr Cordelia Pen called -If can't reach today-pls call tomorrow-regarding appt with Hilty-wants to know if patient has echo and labs and follow's up with PCP and Dr. Loletha Grayer does he need to follow with Hilty?

## 2015-07-29 ENCOUNTER — Encounter: Payer: Self-pay | Admitting: Cardiovascular Disease

## 2015-08-19 ENCOUNTER — Ambulatory Visit (INDEPENDENT_AMBULATORY_CARE_PROVIDER_SITE_OTHER): Payer: Medicare Other | Admitting: Internal Medicine

## 2015-08-19 ENCOUNTER — Encounter: Payer: Self-pay | Admitting: Cardiovascular Disease

## 2015-08-19 ENCOUNTER — Encounter: Payer: Self-pay | Admitting: Internal Medicine

## 2015-08-19 VITALS — BP 170/80 | HR 86 | Ht 72.0 in | Wt 218.4 lb

## 2015-08-19 DIAGNOSIS — I10 Essential (primary) hypertension: Secondary | ICD-10-CM

## 2015-08-19 DIAGNOSIS — R5383 Other fatigue: Secondary | ICD-10-CM

## 2015-08-19 DIAGNOSIS — I255 Ischemic cardiomyopathy: Secondary | ICD-10-CM | POA: Diagnosis not present

## 2015-08-19 DIAGNOSIS — D689 Coagulation defect, unspecified: Secondary | ICD-10-CM

## 2015-08-19 DIAGNOSIS — I4892 Unspecified atrial flutter: Secondary | ICD-10-CM

## 2015-08-19 DIAGNOSIS — Z79899 Other long term (current) drug therapy: Secondary | ICD-10-CM | POA: Diagnosis not present

## 2015-08-19 DIAGNOSIS — Z01818 Encounter for other preprocedural examination: Secondary | ICD-10-CM

## 2015-08-19 DIAGNOSIS — Z950 Presence of cardiac pacemaker: Secondary | ICD-10-CM

## 2015-08-19 MED ORDER — LISINOPRIL 20 MG PO TABS: 20.0000 mg | ORAL_TABLET | Freq: Two times a day (BID) | ORAL | Status: DC

## 2015-08-19 NOTE — Progress Notes (Signed)
   OFFICE NOTE  Chief Complaint:  Fatigue, follow-up echo  Primary Care Physician: Victoria R Rankins, MD  HPI:  Jesus Oconnor is a 82-year-old gentleman who we have been following closely with a history of 5-vessel CABG and atrial flutter in 2012. He has done well with lack of chest pain; however, he says he does have low energy which has been pretty much stable, not necessarily improved after surgery. He also has significant abnormalities in his EKG as well indicating a conduction delay. When his episode of atrial flutter was occurring his ventricular response was only in the 40s. He was also noted on EKG to have trifascicular block and periods of Wenckebach. Today, he actually had some more Wenckebach and couplets as well as continued trifascicular block. He has had paroxysmal A-flutter and we have kept him off a beta blocker for those reasons. I placed him on a monitor which demonstrated significant bradycardia for which she was symptomatic and ultimately underwent a permanent pacemaker. He reports that this has helped him significantly and recently followed up with Dr. C in the office. The pacemaker was interrogated and apparently is working properly. Jesus Oconnor has no significant complaints other than some right knee pain and itchiness and tenderness along the lower aspect of his midline sternal incision, where he has a keloid scar.  Of note he recently had acute cholecystitis and is status post cholecystectomy.  Jesus Oconnor returns for followup today. He saw Dr. C last week for pacemaker check and it seems to be working appropriately. He has had some problems with heart failure. He had fluid gain and it turns out that he was not taking his Lasix on a scheduled basis. He since restarted taking that every day and is feeling much better. He has no complaints of shortness of breath or other symptoms.  08/19/2015  Jesus Oconnor was seen today back in the office for follow-up. I had last  seen him in 2015 and he was recently seen by Dr. C in the office. This was for device check at which time he was noted to have some worsening fatigue. He is also noted to have some leg swelling. An echocardiogram was performed and unfortunately shows a marked reduction in LVEF to 30-35%. The ventricle was noted to be mildly dilated with diffuse hypokinesis and grade 2 diastolic dysfunction. He is currently on Lasix 40 mg daily and reports his weight is been fairly stable. He denies any orthopnea, PND or significant worsening of dyspnea on exertion. He has reported however fatigue and also denies any chest pain. Blood pressure is elevated today 170/80. When discussing about possible causes of his cardiomyopathy, he does report that he is using at least a moderate amount of alcohol and drinks 3-4 beers at least 3 times a week.  PMHx:  Past Medical History  Diagnosis Date  . HTN (hypertension)   . Dyslipidemia   . DM2 (diabetes mellitus, type 2) (HCC)   . COPD (chronic obstructive pulmonary disease) (HCC)   . CHF (congestive heart failure) (HCC)   . Hyperlipidemia   . Sore throat   . Cough   . Wheezing   . Leg swelling   . Constipation   . Diarrhea   . Coronary artery disease   . Atrial flutter (HCC)     severe bradycardia  . Shortness of breath   . Pacemaker 05/22/2012    Dr  Croitoru    Medtronic Adapta  . Mobitz type 1 second degree   atrioventricular block   . 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 SVG to firts & second diagonals,SVG to forth posterolateral branch to RCA  . Diastolic dysfunction     Echo 06/12/8410-[facility] I diastolic dysfunction,EF 38-87%    Past Surgical History  Procedure Laterality Date  . Cardiac catheterization  09/10/2010    severe LAD,D1,D2 & ostial D2  . Cabg x5  09/13/2010    Dr Farrel Conners to LAD,SVG to obtuse marginal,seq. SVG to first & second diagonals,SVG to fourth posterolateral branch RCA  . Repair of umbilical hernia.    . Repair  of left indirect inguinal hernia with mesh.    . Cholecystectomy  october 2012  . Coronary artery bypass graft    . Insert / replace / remove pacemaker  05/22/2012     dual chamber  . Permanent pacemaker insertion N/A 05/22/2012    Procedure: PERMANENT PACEMAKER INSERTION;  Surgeon: Sanda Klein, MD;  Location: [facility] CATH LAB;  Service: Cardiovascular;  Laterality: N/A;    FAMHx:  Family History  Problem Relation Age of Onset  . Heart disease Father   . Heart disease Brother     s/p cabg  . Heart disease Maternal Uncle     SOCHx:   reports that he quit smoking about 5 years ago. His smoking use included Cigarettes. He quit after 40 years of use. He has never used smokeless tobacco. He reports that he drinks alcohol. He reports that he does not use illicit drugs.  ALLERGIES:  No Known Allergies  ROS: A comprehensive review of systems was negative.  HOME MEDS: Current Outpatient Prescriptions  Medication Sig Dispense Refill  . amLODipine (NORVASC) 5 MG tablet Take 1 tablet by mouth  daily 90 tablet 3  . aspirin 81 MG tablet Take 81 mg by mouth daily.      . Cyanocobalamin (B-12) 1000 MCG/ML KIT Inject as directed.    . furosemide (LASIX) 40 MG tablet Take 1 tablet by mouth  daily 90 tablet 3  . Iron Combinations (IRON COMPLEX) CAPS Take by mouth.    Marland Kitchen lisinopril (PRINIVIL,ZESTRIL) 10 MG tablet Take 1 tablet (10 mg total) by mouth 2 (two) times daily. 180 tablet 3  . metFORMIN (GLUCOPHAGE) 1000 MG tablet Take 500 mg by mouth 2 (two) times daily with a meal.     . metoprolol tartrate (LOPRESSOR) 25 MG tablet Take 1 tablet by mouth two  times daily 180 tablet 3  . ONETOUCH VERIO test strip daily. As directed  4  . potassium chloride SA (K-DUR,KLOR-CON) 20 MEQ tablet TAKE 1 TABLET BY MOUTH DAILY 30 tablet 10  . traZODone (DESYREL) 50 MG tablet Take 50-100 mg by mouth at bedtime as needed for sleep.     No current facility-administered medications for this visit.     LABS/IMAGING: No results found for this or any previous visit (from the past 48 hour(s)). No results found.  VITALS: BP 170/80 mmHg  Pulse 86  Ht 6' (1.829 m)  Wt 218 lb 6.4 oz (99.066 kg)  BMI 29.61 kg/m2  EXAM: General appearance: alert and no distress Neck: no carotid bruit and no JVD Lungs: clear to auscultation bilaterally Heart: regular rate and rhythm, S1, S2 normal, no murmur, click, rub or gallop, Abdomen: soft, non-tender; bowel sounds normal; no masses,  no organomegaly Extremities: trace bilateral pedal edema Pulses: 2+ and symmetric Skin: Skin color, texture, turgor normal. No rashes or lesions or 3" keloid scar at  the inferior aspect of his midline incision Neurologic: Grossly normal Psych: Mood, affect normal  EKG: Deferred  ASSESSMENT: 1. Cardiomyopathy with newly reduced EF to 30-35% 2. 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 Short episode of paroxysmal atrial fibrillation in the setting of acute cholecystitis, without significant recurrence on aspirin 4. Hypertension-controlled 5. Dyslipidemia-at goal 6. CABG x 5 vessels (2012)  PLAN: 1.   Jesus Oconnor has recently had some worsening fatigue without complaints of shortness of breath. He has some leg swelling. Echo shows newly reduced EF to 30-35%. I'm concerned that this could be related to new ischemia and/or possibly loss of his bypass grafts which are now 80 years old. Other possibilities could include alcohol use although it is moderate. I advised him to discontinue alcohol use of possible to try to see if that helps with his cardiomyopathy. I would also advise a heart catheterization. We will schedule him for left and right heart catheterization. This should happen sooner than later and unfortunately I will be unavailable next week for continuing medical education therefore will schedule it with one of my partners. I will  increase his lisinopril to 40 mg twice a day today to help with blood pressure and afterload reduction.  Follow-up with me after his catheterization.  Pixie Casino, MD, [facility] Attending Cardiologist [place] [facility] Karyss Frese 08/19/2015, 10:27 AM

## 2015-08-19 NOTE — Patient Instructions (Addendum)
Your physician has requested that you have a RIGHT & LEFT cardiac catheterization @ [facility] - next week per MD. Cardiac catheterization is used to diagnose and/or treat various heart conditions. Doctors may recommend this procedure for a number of different reasons. The most common reason is to evaluate chest pain. Chest pain can be a symptom of coronary artery disease (CAD), and cardiac catheterization can show whether plaque is narrowing or blocking your heart's arteries. This procedure is also used to evaluate the valves, as well as measure the blood flow and oxygen levels in different parts of your heart. For further information please visit [email or web address]. Please follow instruction sheet, as given.  Following your catheterization, you will not be allowed to drive for 3 days.  No lifting, pushing, or pulling greater that 10 pounds is allowed for 1 week.  You will be required to have the following tests prior to the procedure:  1. Blood work - the blood work can be done no more than 14 days prior to the procedure.  It can be done at any [facility] lab. There is a lab downstairs on the first floor of this building in [address] and one at [address].  2. Chest X-ray - this can be done at [place] in the [place] @ [address]   Your physician has recommended you make the following change in your medication: INCREASE lisinopril to 20mg  twice daily  Your physician recommends that you schedule a follow-up appointment in: 3 weeks after the heart cath w/Dr. Debara Pickett

## 2015-08-20 LAB — BASIC METABOLIC PANEL
BUN: 9 mg/dL (ref 7–25)
CHLORIDE: 101 mmol/L (ref 98–110)
CO2: 20 mmol/L (ref 20–31)
Calcium: 9.7 mg/dL (ref 8.6–10.3)
Creat: 0.86 mg/dL (ref 0.70–1.11)
Glucose, Bld: 119 mg/dL — ABNORMAL HIGH (ref 65–99)
Potassium: 4.4 mmol/L (ref 3.5–5.3)
SODIUM: 143 mmol/L (ref 135–146)

## 2015-08-20 LAB — CBC
HCT: 41.1 % (ref 38.5–50.0)
HEMOGLOBIN: 14.2 g/dL (ref 13.2–17.1)
MCH: 32.2 pg (ref 27.0–33.0)
MCHC: 34.5 g/dL (ref 32.0–36.0)
MCV: 93.2 fL (ref 80.0–100.0)
MPV: 9.8 fL (ref 7.5–12.5)
Platelets: 171 10*3/uL (ref 140–400)
RBC: 4.41 MIL/uL (ref 4.20–5.80)
RDW: 13.5 % (ref 11.0–15.0)
WBC: 6.3 10*3/uL (ref 3.8–10.8)

## 2015-08-20 LAB — PROTIME-INR
INR: 1
Prothrombin Time: 10.9 s (ref 9.0–11.5)

## 2015-08-20 LAB — TSH: TSH: 1.76 mIU/L (ref 0.40–4.50)

## 2015-08-20 LAB — APTT: APTT: 29 s (ref 22–34)

## 2015-08-27 ENCOUNTER — Ambulatory Visit
Admission: RE | Admit: 2015-08-27 | Discharge: 2015-08-27 | Disposition: A | Discharge status: Home / Self Care | Payer: Medicare Other | Source: Ambulatory Visit | Attending: Internal Medicine | Admitting: Internal Medicine

## 2015-08-27 DIAGNOSIS — Z01818 Encounter for other preprocedural examination: Secondary | ICD-10-CM

## 2015-08-28 ENCOUNTER — Ambulatory Visit (HOSPITAL_COMMUNITY)
Admission: RE | Admit: 2015-08-28 | Discharge: 2015-08-28 | Disposition: A | Discharge status: Home / Self Care | Payer: Medicare Other | Source: Ambulatory Visit | Attending: Cardiovascular Disease | Admitting: Cardiovascular Disease

## 2015-08-28 ENCOUNTER — Encounter (HOSPITAL_COMMUNITY)
Admission: RE | Disposition: A | Discharge status: Home / Self Care | Payer: Self-pay | Source: Ambulatory Visit | Attending: Cardiovascular Disease

## 2015-08-28 DIAGNOSIS — I5042 Chronic combined systolic (congestive) and diastolic (congestive) heart failure: Secondary | ICD-10-CM | POA: Insufficient documentation

## 2015-08-28 DIAGNOSIS — I110 Hypertensive heart disease with heart failure: Secondary | ICD-10-CM | POA: Insufficient documentation

## 2015-08-28 DIAGNOSIS — I255 Ischemic cardiomyopathy: Secondary | ICD-10-CM | POA: Diagnosis not present

## 2015-08-28 DIAGNOSIS — Z950 Presence of cardiac pacemaker: Secondary | ICD-10-CM | POA: Insufficient documentation

## 2015-08-28 DIAGNOSIS — I441 Atrioventricular block, second degree: Secondary | ICD-10-CM | POA: Diagnosis not present

## 2015-08-28 DIAGNOSIS — Z8249 Family history of ischemic heart disease and other diseases of the circulatory system: Secondary | ICD-10-CM | POA: Diagnosis not present

## 2015-08-28 DIAGNOSIS — I4892 Unspecified atrial flutter: Secondary | ICD-10-CM | POA: Insufficient documentation

## 2015-08-28 DIAGNOSIS — I25810 Atherosclerosis of coronary artery bypass graft(s) without angina pectoris: Secondary | ICD-10-CM | POA: Diagnosis not present

## 2015-08-28 DIAGNOSIS — I2582 Chronic total occlusion of coronary artery: Secondary | ICD-10-CM | POA: Diagnosis not present

## 2015-08-28 DIAGNOSIS — E785 Hyperlipidemia, unspecified: Secondary | ICD-10-CM | POA: Diagnosis not present

## 2015-08-28 DIAGNOSIS — J449 Chronic obstructive pulmonary disease, unspecified: Secondary | ICD-10-CM | POA: Diagnosis not present

## 2015-08-28 DIAGNOSIS — Z7984 Long term (current) use of oral hypoglycemic drugs: Secondary | ICD-10-CM | POA: Diagnosis not present

## 2015-08-28 DIAGNOSIS — E119 Type 2 diabetes mellitus without complications: Secondary | ICD-10-CM | POA: Insufficient documentation

## 2015-08-28 DIAGNOSIS — I272 Other secondary pulmonary hypertension: Secondary | ICD-10-CM | POA: Insufficient documentation

## 2015-08-28 DIAGNOSIS — Z87891 Personal history of nicotine dependence: Secondary | ICD-10-CM | POA: Diagnosis not present

## 2015-08-28 DIAGNOSIS — Z7982 Long term (current) use of aspirin: Secondary | ICD-10-CM | POA: Insufficient documentation

## 2015-08-28 DIAGNOSIS — I2510 Atherosclerotic heart disease of native coronary artery without angina pectoris: Secondary | ICD-10-CM | POA: Diagnosis present

## 2015-08-28 HISTORY — PX: CARDIAC CATHETERIZATION: SHX172

## 2015-08-28 LAB — POCT I-STAT 3, ART BLOOD GAS (G3+)
ACID-BASE EXCESS: 2 mmol/L (ref 0.0–2.0)
BICARBONATE: 26 meq/L — AB (ref 20.0–24.0)
O2 Saturation: 95 %
PH ART: 7.447 (ref 7.350–7.450)
PO2 ART: 73 mmHg — AB (ref 80.0–100.0)
TCO2: 27 mmol/L (ref 0–100)
pCO2 arterial: 37.7 mmHg (ref 35.0–45.0)

## 2015-08-28 LAB — POCT I-STAT 3, VENOUS BLOOD GAS (G3P V)
Acid-Base Excess: 2 mmol/L (ref 0.0–2.0)
Bicarbonate: 27.4 mEq/L — ABNORMAL HIGH (ref 20.0–24.0)
O2 SAT: 65 %
PCO2 VEN: 43 mmHg — AB (ref 45.0–50.0)
PO2 VEN: 34 mmHg (ref 31.0–45.0)
TCO2: 29 mmol/L (ref 0–100)
pH, Ven: 7.414 — ABNORMAL HIGH (ref 7.250–7.300)

## 2015-08-28 LAB — GLUCOSE, CAPILLARY
GLUCOSE-CAPILLARY: 126 mg/dL — AB (ref 65–99)
GLUCOSE-CAPILLARY: 121 mg/dL — AB (ref 65–99)

## 2015-08-28 SURGERY — RIGHT/LEFT HEART CATH AND CORONARY/GRAFT ANGIOGRAPHY

## 2015-08-28 MED ORDER — ACETAMINOPHEN 325 MG PO TABS: 650.0000 mg | ORAL_TABLET | ORAL | Status: DC | PRN

## 2015-08-28 MED ORDER — LIDOCAINE HCL (PF) 1 % IJ SOLN
INTRAMUSCULAR | Status: AC
  Filled 2015-08-28: qty 30

## 2015-08-28 MED ORDER — SODIUM CHLORIDE 0.9% FLUSH: 3.0000 mL | INTRAVENOUS | Status: DC | PRN

## 2015-08-28 MED ORDER — DIAZEPAM 5 MG PO TABS
ORAL_TABLET | ORAL | Status: AC
  Filled 2015-08-28: qty 1

## 2015-08-28 MED ORDER — SODIUM CHLORIDE 0.9 % IV SOLN: INTRAVENOUS | Status: DC

## 2015-08-28 MED ORDER — SODIUM CHLORIDE 0.9 % IV SOLN
INTRAVENOUS | Status: DC
  Administered 2015-08-28: 07:00:00 via INTRAVENOUS

## 2015-08-28 MED ORDER — MIDAZOLAM HCL 2 MG/2ML IJ SOLN
INTRAMUSCULAR | Status: DC | PRN
  Administered 2015-08-28: 2 mg via INTRAVENOUS

## 2015-08-28 MED ORDER — MIDAZOLAM HCL 2 MG/2ML IJ SOLN
INTRAMUSCULAR | Status: AC
  Filled 2015-08-28: qty 2

## 2015-08-28 MED ORDER — HEPARIN (PORCINE) IN NACL 2-0.9 UNIT/ML-% IJ SOLN
INTRAMUSCULAR | Status: AC
  Filled 2015-08-28: qty 1000

## 2015-08-28 MED ORDER — LIDOCAINE HCL (PF) 1 % IJ SOLN
INTRAMUSCULAR | Status: DC | PRN
  Administered 2015-08-28: 09:00:00

## 2015-08-28 MED ORDER — ASPIRIN EC 81 MG PO TBEC: 81.0000 mg | DELAYED_RELEASE_TABLET | Freq: Every day | ORAL | Status: DC

## 2015-08-28 MED ORDER — HEPARIN (PORCINE) IN NACL 2-0.9 UNIT/ML-% IJ SOLN
INTRAMUSCULAR | Status: AC
  Filled 2015-08-28: qty 500

## 2015-08-28 MED ORDER — IOPAMIDOL (ISOVUE-370) INJECTION 76%
INTRAVENOUS | Status: AC
  Filled 2015-08-28: qty 125

## 2015-08-28 MED ORDER — ASPIRIN 81 MG PO CHEW
81.0000 mg | CHEWABLE_TABLET | ORAL | Status: AC
  Administered 2015-08-28: 81 mg via ORAL

## 2015-08-28 MED ORDER — ASPIRIN 81 MG PO CHEW
CHEWABLE_TABLET | ORAL | Status: AC
  Filled 2015-08-28: qty 1

## 2015-08-28 MED ORDER — SODIUM CHLORIDE 0.9% FLUSH
3.0000 mL | INTRAVENOUS | Status: DC | PRN
Start: 2015-08-28 — End: 2015-08-28

## 2015-08-28 MED ORDER — IOPAMIDOL (ISOVUE-370) INJECTION 76%
INTRAVENOUS | Status: DC | PRN
  Administered 2015-08-28: 140 mL via INTRAVENOUS

## 2015-08-28 MED ORDER — DIAZEPAM 5 MG PO TABS
5.0000 mg | ORAL_TABLET | ORAL | Status: DC | PRN
  Administered 2015-08-28: 5 mg via ORAL

## 2015-08-28 MED ORDER — SODIUM CHLORIDE 0.9 % IV SOLN: 250.0000 mL | INTRAVENOUS | Status: DC | PRN

## 2015-08-28 MED ORDER — IOPAMIDOL (ISOVUE-370) INJECTION 76%
INTRAVENOUS | Status: AC
  Filled 2015-08-28: qty 50

## 2015-08-28 MED ORDER — FENTANYL CITRATE (PF) 100 MCG/2ML IJ SOLN
INTRAMUSCULAR | Status: DC | PRN
  Administered 2015-08-28: 25 ug via INTRAVENOUS

## 2015-08-28 MED ORDER — ONDANSETRON HCL 4 MG/2ML IJ SOLN: 4.0000 mg | Freq: Four times a day (QID) | INTRAMUSCULAR | Status: DC | PRN

## 2015-08-28 MED ORDER — FENTANYL CITRATE (PF) 100 MCG/2ML IJ SOLN
INTRAMUSCULAR | Status: AC
  Filled 2015-08-28: qty 2

## 2015-08-28 MED ORDER — SODIUM CHLORIDE 0.9% FLUSH: 3.0000 mL | Freq: Two times a day (BID) | INTRAVENOUS | Status: DC

## 2015-08-28 SURGICAL SUPPLY — 15 items
CATH INFINITI 5 FR AL2 (CATHETERS) ×3 IMPLANT
CATH INFINITI 5 FR IM (CATHETERS) ×3 IMPLANT
CATH INFINITI 5 FR LCB (CATHETERS) ×3 IMPLANT
CATH INFINITI 5FR JL5 (CATHETERS) ×3 IMPLANT
CATH INFINITI 5FR MULTPACK ANG (CATHETERS) ×3 IMPLANT
CATH SWAN GANZ 7F STRAIGHT (CATHETERS) ×3 IMPLANT
KIT HEART LEFT (KITS) ×3 IMPLANT
KIT HEART RIGHT NAMIC (KITS) ×3 IMPLANT
PACK CARDIAC CATHETERIZATION (CUSTOM PROCEDURE TRAY) ×3 IMPLANT
SHEATH PINNACLE 5F 10CM (SHEATH) ×3 IMPLANT
SHEATH PINNACLE 7F 10CM (SHEATH) ×3 IMPLANT
SYR MEDRAD MARK V 150ML (SYRINGE) ×3 IMPLANT
TRANSDUCER W/STOPCOCK (MISCELLANEOUS) ×6 IMPLANT
WIRE EMERALD 3MM-J .025X260CM (WIRE) ×3 IMPLANT
WIRE EMERALD 3MM-J .035X150CM (WIRE) ×3 IMPLANT

## 2015-08-28 NOTE — H&P (View-Only) (Signed)
OFFICE NOTE  Chief Complaint:  Fatigue, follow-up echo  Primary Care Physician: Aretta Nip, MD  HPI:  Jesus Oconnor is a 82 year old gentleman who we have been following closely with a history of 5-vessel CABG and atrial flutter in 2012. He has done well with lack of chest pain; however, he says he does have low energy which has been pretty much stable, not necessarily improved after surgery. He also has significant abnormalities in his EKG as well indicating a conduction delay. When his episode of atrial flutter was occurring his ventricular response was only in the 40s. He was also noted on EKG to have trifascicular block and periods of Wenckebach. Today, he actually had some more Wenckebach and couplets as well as continued trifascicular block. He has had paroxysmal A-flutter and we have kept him off a beta blocker for those reasons. I placed him on a monitor which demonstrated significant bradycardia for which she was symptomatic and ultimately underwent a permanent pacemaker. He reports that this has helped him significantly and recently followed up with Dr. Loletha Grayer in the office. The pacemaker was interrogated and apparently is working properly. Jesus Oconnor has no significant complaints other than some right knee pain and itchiness and tenderness along the lower aspect of his midline sternal incision, where he has a keloid scar.  Of note he recently had acute cholecystitis and is status post cholecystectomy.  Jesus Oconnor returns for followup today. He saw Dr. Loletha Grayer last week for pacemaker check and it seems to be working appropriately. He has had some problems with heart failure. He had fluid gain and it turns out that he was not taking his Lasix on a scheduled basis. He since restarted taking that every day and is feeling much better. He has no complaints of shortness of breath or other symptoms.  08/19/2015  Jesus Oconnor was seen today back in the office for follow-up. I had last  seen him in 2015 and he was recently seen by Dr. Loletha Grayer in the office. This was for device check at which time he was noted to have some worsening fatigue. He is also noted to have some leg swelling. An echocardiogram was performed and unfortunately shows a marked reduction in LVEF to 30-35%. The ventricle was noted to be mildly dilated with diffuse hypokinesis and grade 2 diastolic dysfunction. He is currently on Lasix 40 mg daily and reports his weight is been fairly stable. He denies any orthopnea, PND or significant worsening of dyspnea on exertion. He has reported however fatigue and also denies any chest pain. Blood pressure is elevated today 170/80. When discussing about possible causes of his cardiomyopathy, he does report that he is using at least a moderate amount of alcohol and drinks 3-4 beers at least 3 times a week.  PMHx:  Past Medical History  Diagnosis Date  . HTN (hypertension)   . Dyslipidemia   . DM2 (diabetes mellitus, type 2) ([place])   . COPD (chronic obstructive pulmonary disease) ([place])   . CHF (congestive heart failure) ([place])   . Hyperlipidemia   . Sore throat   . Cough   . Wheezing   . Leg swelling   . Constipation   . Diarrhea   . Coronary artery disease   . Atrial flutter (HCC)     severe bradycardia  . Shortness of breath   . Pacemaker 05/22/2012    Dr  Croitoru    Medtronic Adapta  . Mobitz type 1 second degree  atrioventricular block   . 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 SVG to firts & second diagonals,SVG to forth posterolateral branch to RCA  . Diastolic dysfunction     Echo 06/12/8410-[facility] I diastolic dysfunction,EF 38-87%    Past Surgical History  Procedure Laterality Date  . Cardiac catheterization  09/10/2010    severe LAD,D1,D2 & ostial D2  . Cabg x5  09/13/2010    Dr Farrel Conners to LAD,SVG to obtuse marginal,seq. SVG to first & second diagonals,SVG to fourth posterolateral branch RCA  . Repair of umbilical hernia.    . Repair  of left indirect inguinal hernia with mesh.    . Cholecystectomy  october 2012  . Coronary artery bypass graft    . Insert / replace / remove pacemaker  05/22/2012     dual chamber  . Permanent pacemaker insertion N/A 05/22/2012    Procedure: PERMANENT PACEMAKER INSERTION;  Surgeon: Sanda Klein, MD;  Location: [facility] CATH LAB;  Service: Cardiovascular;  Laterality: N/A;    FAMHx:  Family History  Problem Relation Age of Onset  . Heart disease Father   . Heart disease Brother     s/p cabg  . Heart disease Maternal Uncle     SOCHx:   reports that he quit smoking about 5 years ago. His smoking use included Cigarettes. He quit after 40 years of use. He has never used smokeless tobacco. He reports that he drinks alcohol. He reports that he does not use illicit drugs.  ALLERGIES:  No Known Allergies  ROS: A comprehensive review of systems was negative.  HOME MEDS: Current Outpatient Prescriptions  Medication Sig Dispense Refill  . amLODipine (NORVASC) 5 MG tablet Take 1 tablet by mouth  daily 90 tablet 3  . aspirin 81 MG tablet Take 81 mg by mouth daily.      . Cyanocobalamin (B-12) 1000 MCG/ML KIT Inject as directed.    . furosemide (LASIX) 40 MG tablet Take 1 tablet by mouth  daily 90 tablet 3  . Iron Combinations (IRON COMPLEX) CAPS Take by mouth.    Marland Kitchen lisinopril (PRINIVIL,ZESTRIL) 10 MG tablet Take 1 tablet (10 mg total) by mouth 2 (two) times daily. 180 tablet 3  . metFORMIN (GLUCOPHAGE) 1000 MG tablet Take 500 mg by mouth 2 (two) times daily with a meal.     . metoprolol tartrate (LOPRESSOR) 25 MG tablet Take 1 tablet by mouth two  times daily 180 tablet 3  . ONETOUCH VERIO test strip daily. As directed  4  . potassium chloride SA (K-DUR,KLOR-CON) 20 MEQ tablet TAKE 1 TABLET BY MOUTH DAILY 30 tablet 10  . traZODone (DESYREL) 50 MG tablet Take 50-100 mg by mouth at bedtime as needed for sleep.     No current facility-administered medications for this visit.     LABS/IMAGING: No results found for this or any previous visit (from the past 48 hour(s)). No results found.  VITALS: BP 170/80 mmHg  Pulse 86  Ht 6' (1.829 m)  Wt 218 lb 6.4 oz (99.066 kg)  BMI 29.61 kg/m2  EXAM: General appearance: alert and no distress Neck: no carotid bruit and no JVD Lungs: clear to auscultation bilaterally Heart: regular rate and rhythm, S1, S2 normal, no murmur, click, rub or gallop, Abdomen: soft, non-tender; bowel sounds normal; no masses,  no organomegaly Extremities: trace bilateral pedal edema Pulses: 2+ and symmetric Skin: Skin color, texture, turgor normal. No rashes or lesions or 3" keloid scar at  the inferior aspect of his midline incision Neurologic: Grossly normal Psych: Mood, affect normal  EKG: Deferred  ASSESSMENT: 1. Cardiomyopathy with newly reduced EF to 30-35% 2. 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 Short episode of paroxysmal atrial fibrillation in the setting of acute cholecystitis, without significant recurrence on aspirin 4. Hypertension-controlled 5. Dyslipidemia-at goal 6. CABG x 5 vessels (2012)  PLAN: 1.   Jesus Oconnor has recently had some worsening fatigue without complaints of shortness of breath. He has some leg swelling. Echo shows newly reduced EF to 30-35%. I'm concerned that this could be related to new ischemia and/or possibly loss of his bypass grafts which are now 80 years old. Other possibilities could include alcohol use although it is moderate. I advised him to discontinue alcohol use of possible to try to see if that helps with his cardiomyopathy. I would also advise a heart catheterization. We will schedule him for left and right heart catheterization. This should happen sooner than later and unfortunately I will be unavailable next week for continuing medical education therefore will schedule it with one of my partners. I will  increase his lisinopril to 40 mg twice a day today to help with blood pressure and afterload reduction.  Follow-up with me after his catheterization.  Pixie Casino, MD, [facility] Attending Cardiologist [place] [facility] Kirk Basquez 08/19/2015, 10:27 AM

## 2015-08-28 NOTE — Discharge Instructions (Signed)
NO METFORMIN/GLUCOPHAGE FOR 2 DAYS ° ° °Angiogram, Care After °These instructions give you information about caring for yourself after your procedure. Your doctor may also give you more specific instructions. Call your doctor if you have any problems or questions after your procedure.  °HOME CARE °· Take medicines only as told by your doctor. °· Follow your doctor's instructions about: °¨ Care of the area where the tube was inserted. °¨ Bandage (dressing) changes and removal. °· You may shower 24-48 hours after the procedure or as told by your doctor. °· Do not take baths, swim, or use a hot tub until your doctor approves. °· Every day, check the area where the tube was inserted. Watch for: °¨ Redness, swelling, or pain. °¨ Fluid, blood, or pus. °· Do not apply powder or lotion to the site. °· Do not lift anything that is heavier than 10 lb (4.5 kg) for 5 days or as told by your doctor. °· Ask your doctor when you can: °¨ Return to work or school. °¨ Do physical activities or play sports. °¨ Have sex. °· Do not drive or operate heavy machinery for 24 hours or as told by your doctor. °· Have someone with you for the first 24 hours after the procedure. °· Keep all follow-up visits as told by your doctor. This is important. °GET HELP IF: °· You have a fever.   °· You have chills.   °· You have more bleeding from the area where the tube was inserted. Hold pressure on the area. °· You have redness, swelling, or pain in the area where the tube was inserted. °· You have fluid or pus coming from the area. °GET HELP RIGHT AWAY IF:  °· You have a lot of pain in the area where the tube was inserted. °· The area where the tube was inserted is bleeding, and the bleeding does not stop after 30 minutes of holding steady pressure on the area. °· The area near or just beyond the insertion site becomes pale, cool, tingly, or numb. °  °This information is not intended to replace advice given to you by your health care provider. Make  sure you discuss any questions you have with your health care provider. °  °Document Released: 05/20/2008 Document Revised: 03/14/2014 Document Reviewed: 07/25/2012 °Elsevier Interactive Patient Education ©2016 [employer] ° °

## 2015-08-28 NOTE — Progress Notes (Signed)
Site area: rt groin Site Prior to Removal:  Level 0 Pressure Applied For:   20 minutes Manual:   yes Patient Status During Pull:  stable Post Pull Site:  Level  0 Post Pull Instructions Given:  yes Post Pull Pulses Present: yes Dressing Applied:  tegaderm Bedrest begins @  [phone] Comments:

## 2015-08-28 NOTE — Interval H&P Note (Signed)
Cath Lab Visit (complete for each Cath Lab visit)  Clinical Evaluation Leading to the Procedure:   ACS: No.  Non-ACS:    Anginal Classification: CCS III  Anti-ischemic medical therapy: Maximal Therapy (2 or more classes of medications)  Non-Invasive Test Results: No non-invasive testing performed  Prior CABG: Previous CABG      History and Physical Interval Note:  08/28/2015 7:47 AM  Jesus Oconnor  has presented today for surgery, with the diagnosis of dysenia  The various methods of treatment have been discussed with the patient and family. After consideration of risks, benefits and other options for treatment, the patient has consented to  Procedure(s): Right/Left Heart Cath and Coronary/Graft Angiography (N/A) as a surgical intervention .  The patient's history has been reviewed, patient examined, no change in status, stable for surgery.  I have reviewed the patient's chart and labs.  Questions were answered to the patient's satisfaction.     Shelva Majestic

## 2015-08-31 ENCOUNTER — Encounter (HOSPITAL_COMMUNITY): Payer: Self-pay | Admitting: Cardiovascular Disease

## 2015-09-09 ENCOUNTER — Encounter: Payer: Self-pay | Admitting: Internal Medicine

## 2015-09-09 ENCOUNTER — Ambulatory Visit (INDEPENDENT_AMBULATORY_CARE_PROVIDER_SITE_OTHER): Payer: Medicare Other | Admitting: Internal Medicine

## 2015-09-09 VITALS — BP 167/81 | HR 77 | Ht 71.0 in | Wt 215.2 lb

## 2015-09-09 DIAGNOSIS — I4892 Unspecified atrial flutter: Secondary | ICD-10-CM

## 2015-09-09 DIAGNOSIS — I255 Ischemic cardiomyopathy: Secondary | ICD-10-CM

## 2015-09-09 DIAGNOSIS — I472 Ventricular tachycardia: Secondary | ICD-10-CM | POA: Diagnosis not present

## 2015-09-09 DIAGNOSIS — Z950 Presence of cardiac pacemaker: Secondary | ICD-10-CM

## 2015-09-09 DIAGNOSIS — I5022 Chronic systolic (congestive) heart failure: Secondary | ICD-10-CM | POA: Insufficient documentation

## 2015-09-09 DIAGNOSIS — I4729 Other ventricular tachycardia: Secondary | ICD-10-CM

## 2015-09-09 DIAGNOSIS — I5042 Chronic combined systolic (congestive) and diastolic (congestive) heart failure: Secondary | ICD-10-CM | POA: Insufficient documentation

## 2015-09-09 MED ORDER — CARVEDILOL 25 MG PO TABS: 25.0000 mg | ORAL_TABLET | Freq: Two times a day (BID) | ORAL | Status: DC

## 2015-09-09 MED ORDER — CARVEDILOL 12.5 MG PO TABS: 12.5000 mg | ORAL_TABLET | Freq: Two times a day (BID) | ORAL | Status: DC

## 2015-09-09 NOTE — Patient Instructions (Signed)
Your physician has recommended you make the following change in your medication..  1. STOP metoprolol 2. START carvedilol (coreg) 12.5mg  twice daily  -- this prescription has been sent to [place] physician recommends that you schedule a follow-up appointment in 3 weeks with clinical pharmacist for a blood pressure check   Your physician recommends that you schedule a follow-up appointment in: 3 months with Dr. Debara Pickett

## 2015-09-09 NOTE — Progress Notes (Signed)
OFFICE NOTE  Chief Complaint:  Fatigue, follow-up echo  Primary Care Physician: Aretta Nip, MD  HPI:  Jesus Oconnor is a 82 year old gentleman who we have been following closely with a history of 5-vessel CABG and atrial flutter in 2012. He has done well with lack of chest pain; however, he says he does have low energy which has been pretty much stable, not necessarily improved after surgery. He also has significant abnormalities in his EKG as well indicating a conduction delay. When his episode of atrial flutter was occurring his ventricular response was only in the 40s. He was also noted on EKG to have trifascicular block and periods of Wenckebach. Today, he actually had some more Wenckebach and couplets as well as continued trifascicular block. He has had paroxysmal A-flutter and we have kept him off a beta blocker for those reasons. I placed him on a monitor which demonstrated significant bradycardia for which she was symptomatic and ultimately underwent a permanent pacemaker. He reports that this has helped him significantly and recently followed up with Dr. Loletha Grayer in the office. The pacemaker was interrogated and apparently is working properly. Jesus Oconnor has no significant complaints other than some right knee pain and itchiness and tenderness along the lower aspect of his midline sternal incision, where he has a keloid scar.  Of note he recently had acute cholecystitis and is status post cholecystectomy.  Jesus Oconnor returns for followup today. He saw Dr. Loletha Grayer last week for pacemaker check and it seems to be working appropriately. He has had some problems with heart failure. He had fluid gain and it turns out that he was not taking his Lasix on a scheduled basis. He since restarted taking that every day and is feeling much better. He has no complaints of shortness of breath or other symptoms.  08/19/2015  Jesus Oconnor was seen today back in the office for follow-up. I had last  seen him in 2015 and he was recently seen by Dr. Loletha Grayer in the office. This was for device check at which time he was noted to have some worsening fatigue. He is also noted to have some leg swelling. An echocardiogram was performed and unfortunately shows a marked reduction in LVEF to 30-35%. The ventricle was noted to be mildly dilated with diffuse hypokinesis and grade 2 diastolic dysfunction. He is currently on Lasix 40 mg daily and reports his weight is been fairly stable. He denies any orthopnea, PND or significant worsening of dyspnea on exertion. He has reported however fatigue and also denies any chest pain. Blood pressure is elevated today 170/80. When discussing about possible causes of his cardiomyopathy, he does report that he is using at least a moderate amount of alcohol and drinks 3-4 beers at least 3 times a week.  09/09/2015  Jesus Oconnor returns to follow-up his recent heart catheterization. This demonstrated the following:   Mild global LV dysfunction with an ejection fraction of 45-50%. There is significant mitral annular calcification and trace mitral regurgitation.  Mild pulmonary hypertension.  Significant native CAD with 20% mid left main narrowing; diffuse 60-70% proximal and 70% mid LAD stenoses; total occlusion of the proximal circumflex coronary artery with faint antegrade collaterals; and diffusely irregular RCA with mild calcification without apparent high-grade stenosis.  Patent LIMA graft supplying the mid LAD.  Patent sequential vein graft supplying the diagonal 1 and diagonal 2 vessel with mild 30% narrowing in the region of a valve in the proximal portion of  the graft.  Patent vein graft supplying the distal circumflex marginal vessel with smooth 40% narrowing proximally in the region of a valve.  Patent vein graft supplying the distal posterolateral branch of the RCA.  Based on these findings medical therapy was recommended. His filling pressures seem to be  well-controlled. EF was interestingly reported much higher on this study than it was on his echocardiogram. I had recently increased his lisinopril to 20 mg twice a day. We discussed management options today and I feel that he could use better blood pressure control. In addition he recently stopped alcohol completely check commended him on.  PMHx:  Past Medical History  Diagnosis Date  . HTN (hypertension)   . Dyslipidemia   . DM2 (diabetes mellitus, type 2) ([place])   . COPD (chronic obstructive pulmonary disease) ([place])   . CHF (congestive heart failure) ([place])   . Hyperlipidemia   . Sore throat   . Cough   . Wheezing   . Leg swelling   . Constipation   . Diarrhea   . Coronary artery disease   . Atrial flutter (HCC)     severe bradycardia  . Shortness of breath   . Pacemaker 05/22/2012    Dr  Croitoru    Medtronic Adapta  . Mobitz type 1 second degree atrioventricular block   . 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 SVG to firts & second diagonals,SVG to forth posterolateral branch to RCA  . Diastolic dysfunction     Echo 3/0/0762-UQJFH I diastolic dysfunction,EF 54-56%    Past Surgical History  Procedure Laterality Date  . Cardiac catheterization  09/10/2010    severe LAD,D1,D2 & ostial D2  . Cabg x5  09/13/2010    Dr Farrel Conners to LAD,SVG to obtuse marginal,seq. SVG to first & second diagonals,SVG to fourth posterolateral branch RCA  . Repair of umbilical hernia.    . Repair of left indirect inguinal hernia with mesh.    . Cholecystectomy  october 2012  . Coronary artery bypass graft    . Insert / replace / remove pacemaker  05/22/2012     dual chamber  . Permanent pacemaker insertion N/A 05/22/2012    Procedure: PERMANENT PACEMAKER INSERTION;  Surgeon: Sanda Klein, MD;  Location: [facility]ATH [facility];  Service: Cardiovascular;  Laterality: N/A;  . Cardiac catheterization N/A 08/28/2015    Procedure: Right/Left Heart Cath and Coronary/Graft Angiography;   Surgeon: Troy Sine, MD;  Location: [facility];  Service: Cardiovascular;  Laterality: N/A;    FAMHx:  Family History  Problem Relation Age of Onset  . Heart disease Father   . Heart disease Brother     s/p cabg  . Heart disease Maternal Uncle     SOCHx:   reports that he quit smoking about 5 years ago. His smoking use included Cigarettes. He quit after 40 years of use. He has never used smokeless tobacco. He reports that he drinks alcohol. He reports that he does not use illicit drugs.  ALLERGIES:  No Known Allergies  ROS: A comprehensive review of systems was negative.  HOME MEDS: Current Outpatient Prescriptions  Medication Sig Dispense Refill  . ALPRAZolam (XANAX) 0.5 MG tablet Take 0.5 mg by mouth daily.    Marland Kitchen amLODipine (NORVASC) 5 MG tablet Take 1 tablet by mouth  daily 90 tablet 3  . aspirin 81 MG tablet Take 81 mg by mouth daily.      . calcium carbonate (OS-CAL -  DOSED IN MG OF ELEMENTAL CALCIUM) 1250 (500 Ca) MG tablet Take 1 tablet by mouth daily with breakfast.    . Cyanocobalamin (B-12) 1000 MCG/ML KIT Inject 1 Dose as directed every 30 (thirty) days.     . furosemide (LASIX) 40 MG tablet Take 1 tablet by mouth  daily 90 tablet 3  . Iron Combinations (IRON COMPLEX) CAPS Take 1 tablet by mouth daily.     Marland Kitchen lisinopril (PRINIVIL,ZESTRIL) 20 MG tablet Take 1 tablet (20 mg total) by mouth 2 (two) times daily. 180 tablet 1  . metFORMIN (GLUCOPHAGE) 1000 MG tablet Take 500 mg by mouth 2 (two) times daily with a meal.     . metoprolol tartrate (LOPRESSOR) 25 MG tablet Take 1 tablet by mouth two  times daily 180 tablet 3  . ONETOUCH VERIO test strip daily. As directed  4  . potassium chloride SA (K-DUR,KLOR-CON) 20 MEQ tablet TAKE 1 TABLET BY MOUTH DAILY 30 tablet 10  . pravastatin (PRAVACHOL) 80 MG tablet Take 80 mg by mouth daily.    . traZODone (DESYREL) 50 MG tablet Take 50-100 mg by mouth at bedtime as needed for sleep.     No current facility-administered  medications for this visit.    LABS/IMAGING: No results found for this or any previous visit (from the past 48 hour(s)). No results found.  VITALS: BP 167/81 mmHg  Pulse 77  Ht '5\' 11"'$  (1.803 m)  Wt 215 lb 3.2 oz (97.614 kg)  BMI 30.03 kg/m2  EXAM: Deferred  EKG: Deferred  ASSESSMENT: 1. Cardiomyopathy with newly reduced EF to 30-35% - no new obstructive disease by cath, EF 45-50% 2. 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 Short episode of paroxysmal atrial fibrillation in the setting of acute cholecystitis, without significant recurrence on aspirin 4. Hypertension-controlled 5. Dyslipidemia-at goal 6. CABG x 5 vessels (2012) 7. Recent moderate alcohol abuse-now abstinent  PLAN: 1.   Jesus Oconnor had reduced LV function by echo but it appears that it either may have improved or was overestimated by recent cath. There is no new obstructive disease although he did have some pulmonary hypertension with compensated left heart pressures. Blood pressure remains elevated and I think he benefit from better blood pressure control. Given a cardiomyopathy had like to switch his metoprolol tartrate over to carvedilol 12.5 mg twice a day. Hopefully this will provide a little better blood pressure control and is more indicated with heart failure. I think his alcohol abstinence is also helpful. He is fairly regularly paced and is not clear whether this is related to his cardiomyopathy or not. I will schedule follow-up with 3 weeks with our clinical pharmacist for blood pressure check and possibly we will increase his beta blocker at that time.  Follow-up with me in 3 months.  Pixie Casino, MD, [facility] Attending Cardiologist [place] 09/09/2015, 2:09 PM

## 2015-10-02 ENCOUNTER — Encounter: Payer: Self-pay | Admitting: Pharmacist

## 2015-10-02 ENCOUNTER — Ambulatory Visit (INDEPENDENT_AMBULATORY_CARE_PROVIDER_SITE_OTHER): Payer: Medicare Other | Admitting: Pharmacist

## 2015-10-02 VITALS — BP 144/78 | HR 92 | Wt 214.2 lb

## 2015-10-02 DIAGNOSIS — I10 Essential (primary) hypertension: Secondary | ICD-10-CM | POA: Diagnosis not present

## 2015-10-02 MED ORDER — AMLODIPINE BESYLATE 10 MG PO TABS: 10.0000 mg | ORAL_TABLET | Freq: Every day | ORAL | 3 refills | Status: DC

## 2015-10-02 NOTE — Patient Instructions (Addendum)
Return for a a follow up appointment in 3-4 weeks  Your blood pressure today is 144/78   Check your blood pressure at home daily (if able) and keep record of the readings.  Take your BP meds as follows: Increase amlodipine to 10mg  once daily  (he can take 2 of the 5mg  tablets until he runs out of the current supply).   PLEASE call [phone] if you have any questions or if his medication list is not correct   Bring all of your meds, your BP cuff and your record of home blood pressures to your next appointment.  Exercise as you're able, try to walk approximately 30 minutes per day.  Keep salt intake to a minimum, especially watch canned and prepared boxed foods.  Eat more fresh fruits and vegetables and fewer canned items.  Avoid eating in fast food restaurants.    HOW TO TAKE YOUR BLOOD PRESSURE: . Rest 5 minutes before taking your blood pressure. .  Don't smoke or drink caffeinated beverages for at least 30 minutes before. . Take your blood pressure before (not after) you eat. . Sit comfortably with your back supported and both feet on the floor (don't cross your legs). . Elevate your arm to heart level on a table or a desk. . Use the proper sized cuff. It should fit smoothly and snugly around your bare upper arm. There should be enough room to slip a fingertip under the cuff. The bottom edge of the cuff should be 1 inch above the crease of the elbow. . Ideally, take 3 measurements at one sitting and record the average.

## 2015-10-02 NOTE — Progress Notes (Signed)
Patient ID: Jesus Oconnor                 DOB: 1933-08-30                      MRN: [id number]     HPI: Jesus Oconnor is a 82 y.o. male patient of Dr. Debara Pickett who presents today for hypertension evaluation. He states he has been feeling well since his recent medication changes though he endorses falling several times upon standing. He believes this is more related to his weak knees rather than his blood pressure.   He is unable to give an accurate medication list because his daughter handles them. His daughter was able to verify that she follows the print out from his most recent visit.   Cardiac Hx: HTN, Afib, CAD, CHF, cardiomyopathy, CHF, HLD  Current HTN meds:  Amlodipine '5mg'$  in the evening Carvedilol 12.'5mg'$  BID Furosemide '40mg'$  daily Lisinopril '20mg'$  BID  Previously tried: metoprolol - changed to carvedilol for added BP control  BP goal: <140/90  Family History: He states all his family has heart trouble. Father passed at 10 from MI, mother passed from alzheimers. He has 3 brothers one of which has afib and 2 sisters whose cardiac history is unknown.   Social History: He smoked for 50 years but quit right after his MI about 5 years ago. He occasionally drinks alcohol. He was drinking beer regularly but it caused him to swell so he stopped.   Diet: He eats breakfast out and will occasionally add salt to his food. He drinks 1 cup of coffee each morning and tea and water throughout the day.   Exercise: none. Limited due to bone on bone in knee.   Home BP readings: does not monitor at home  Wt Readings from Last 3 Encounters:  09/09/15 215 lb 3.2 oz (97.6 kg)  08/28/15 210 lb (95.3 kg)  08/19/15 218 lb 6.4 oz (99.1 kg)   BP Readings from Last 3 Encounters:  09/09/15 (!) 167/81  08/28/15 133/62  08/19/15 (!) 170/80   Pulse Readings from Last 3 Encounters:  09/09/15 77  08/28/15 (!) 57  08/19/15 86    Renal function: CrCl cannot be calculated (Unknown ideal  weight.).  Past Medical History:  Diagnosis Date  . Atrial flutter (HCC)    severe bradycardia  . CHF (congestive heart failure) ([place])   . Constipation   . COPD (chronic obstructive pulmonary disease) ([place])   . Coronary artery disease   . Cough   . Diarrhea   . Diastolic dysfunction    Echo 4/0/9811-BJYNW I diastolic dysfunction,EF 29-56%  . DM2 (diabetes mellitus, type 2) ([place])   . Dyslipidemia   . HTN (hypertension)   . Hyperlipidemia   . Leg swelling   . Mobitz type 1 second degree atrioventricular block   . Pacemaker 05/22/2012   Dr  Croitoru    Medtronic Adapta  . 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 SVG to firts & second diagonals,SVG to forth posterolateral branch to RCA  . Shortness of breath   . Sore throat   . Wheezing     Current Outpatient Prescriptions on File Prior to Visit  Medication Sig Dispense Refill  . ALPRAZolam (XANAX) 0.5 MG tablet Take 0.5 mg by mouth daily.    Marland Kitchen amLODipine (NORVASC) 5 MG tablet Take 1 tablet by mouth  daily 90 tablet 3  . aspirin 81 MG  tablet Take 81 mg by mouth daily.      . calcium carbonate (OS-CAL - DOSED IN MG OF ELEMENTAL CALCIUM) 1250 (500 Ca) MG tablet Take 1 tablet by mouth daily with breakfast.    . carvedilol (COREG) 12.5 MG tablet Take 1 tablet (12.5 mg total) by mouth 2 (two) times daily with a meal. 180 tablet 3  . Cyanocobalamin (B-12) 1000 MCG/ML KIT Inject 1 Dose as directed every 30 (thirty) days.     . furosemide (LASIX) 40 MG tablet Take 1 tablet by mouth  daily 90 tablet 3  . Iron Combinations (IRON COMPLEX) CAPS Take 1 tablet by mouth daily.     Marland Kitchen lisinopril (PRINIVIL,ZESTRIL) 20 MG tablet Take 1 tablet (20 mg total) by mouth 2 (two) times daily. 180 tablet 1  . metFORMIN (GLUCOPHAGE) 1000 MG tablet Take 500 mg by mouth 2 (two) times daily with a meal.     . ONETOUCH VERIO test strip daily. As directed  4  . potassium chloride SA (K-DUR,KLOR-CON) 20 MEQ tablet TAKE 1 TABLET BY MOUTH  DAILY 30 tablet 10  . pravastatin (PRAVACHOL) 80 MG tablet Take 80 mg by mouth daily.    . traZODone (DESYREL) 50 MG tablet Take 50-100 mg by mouth at bedtime as needed for sleep.     No current facility-administered medications on file prior to visit.     No Known Allergies   Assessment/Plan: Hypertension: BP is slightly above goal today in office. Will have him increase he amlodipine to '10mg'$  each day. Follow up in hypertension clinic in 3-4 weeks.   Thank you, Lelan Pons. Patterson Hammersmith, [facility]  10/02/2015 8:39 AM

## 2015-10-06 ENCOUNTER — Telehealth: Payer: Self-pay | Admitting: Cardiology

## 2015-10-06 ENCOUNTER — Ambulatory Visit (INDEPENDENT_AMBULATORY_CARE_PROVIDER_SITE_OTHER): Payer: Medicare Other | Admitting: *Deleted

## 2015-10-06 DIAGNOSIS — Z950 Presence of cardiac pacemaker: Secondary | ICD-10-CM | POA: Diagnosis not present

## 2015-10-06 DIAGNOSIS — I441 Atrioventricular block, second degree: Secondary | ICD-10-CM

## 2015-10-06 NOTE — Telephone Encounter (Signed)
Spoke with pt and reminded pt of remote transmission that is due today. Pt verbalized understanding.   

## 2015-10-09 NOTE — Progress Notes (Signed)
Remote pacemaker transmission.   

## 2015-10-13 ENCOUNTER — Encounter: Payer: Self-pay | Admitting: Cardiology

## 2015-10-20 LAB — CUP PACEART REMOTE DEVICE CHECK
Brady Statistic AP VP Percent: 90 %
Brady Statistic AP VS Percent: 0 %
Brady Statistic AS VP Percent: 10 %
Brady Statistic AS VS Percent: 0 %
Implantable Lead Implant Date: 20140318
Implantable Lead Location: 753859
Implantable Lead Location: 753860
Lead Channel Impedance Value: 519 Ohm
Lead Channel Pacing Threshold Amplitude: 0.5 V
Lead Channel Pacing Threshold Pulse Width: 0.4 ms
Lead Channel Setting Pacing Amplitude: 2 V
Lead Channel Setting Pacing Pulse Width: 0.4 ms
MDC IDC LEAD IMPLANT DT: 20140318
MDC IDC MSMT BATTERY IMPEDANCE: 231 Ohm
MDC IDC MSMT BATTERY REMAINING LONGEVITY: 107 mo
MDC IDC MSMT BATTERY VOLTAGE: 2.8 V
MDC IDC MSMT LEADCHNL RA PACING THRESHOLD AMPLITUDE: 0.625 V
MDC IDC MSMT LEADCHNL RA PACING THRESHOLD PULSEWIDTH: 0.4 ms
MDC IDC MSMT LEADCHNL RV IMPEDANCE VALUE: 828 Ohm
MDC IDC SESS DTM: 20170802192139
MDC IDC SET LEADCHNL RV PACING AMPLITUDE: 2.5 V
MDC IDC SET LEADCHNL RV SENSING SENSITIVITY: 2.8 mV

## 2015-10-30 ENCOUNTER — Ambulatory Visit (INDEPENDENT_AMBULATORY_CARE_PROVIDER_SITE_OTHER): Payer: Medicare Other | Admitting: Pharmacist Clinician (PhC)/ Clinical Pharmacy Specialist

## 2015-10-30 DIAGNOSIS — I10 Essential (primary) hypertension: Secondary | ICD-10-CM | POA: Diagnosis not present

## 2015-10-30 NOTE — Progress Notes (Signed)
Patient ID: Jesus Oconnor                 DOB: Mar 19, 1933                      MRN: [id number]     HPI: Jesus Oconnor is a 82 y.o. male patient of Dr. Rennis Golden who presents today for hypertension evaluation. He was seen last month and his amlodipine was increased from 5 to 10 mg daily.  At a visit with his PCP yesterday his pressure was around 130 systolic.  He can't recall the diastolic reading.    He is unable to give an accurate medication list because his daughter handles them. His daughter was able to verify that she follows the print out from his most recent visit.  He had previously had problems with multiple falls, but reports that he has not had any in the past few weeks.  Cardiac Hx: HTN, Afib, CAD, CHF, cardiomyopathy, CHF, HLD  Current HTN meds:  Amlodipine 10 mg in the evening Carvedilol 12.5 mg BID Furosemide 40 mg daily Lisinopril 20 mg BID  Previously tried: metoprolol - changed to carvedilol for added BP control  BP goal: <140/90  (DM)  Family History: He states all his family has heart trouble. Father passed at 37 from MI, mother passed from alzheimers. He has 3 brothers one of which has afib and 2 sisters whose cardiac history is unknown.   Social History: He smoked for 50 years but quit right after his MI about 5 years ago. He occasionally drinks alcohol. He was drinking beer regularly but it caused him to swell so he stopped.   Diet: He eats breakfast out and will occasionally add salt to his food. He drinks 1 cup of coffee each morning and tea and water throughout the day.   Exercise: none. Limited due to bone on bone in knee.   Home BP readings: does not monitor at home  Wt Readings from Last 3 Encounters:  10/02/15 214 lb 3.2 oz (97.2 kg)  09/09/15 215 lb 3.2 oz (97.6 kg)  08/28/15 210 lb (95.3 kg)   BP Readings from Last 3 Encounters:  10/02/15 (!) 144/78  09/09/15 (!) 167/81  08/28/15 133/62   Pulse Readings from Last 3 Encounters:  10/02/15 92    09/09/15 77  08/28/15 (!) 57    Renal function: CrCl cannot be calculated (Unknown ideal weight.).  Past Medical History:  Diagnosis Date  . Atrial flutter (HCC)    severe bradycardia  . CHF (congestive heart failure) (HCC)   . Constipation   . COPD (chronic obstructive pulmonary disease) (HCC)   . Coronary artery disease   . Cough   . Diarrhea   . Diastolic dysfunction    Echo 09/11/2010-grade I diastolic dysfunction,EF 55-60%  . DM2 (diabetes mellitus, type 2) (HCC)   . Dyslipidemia   . HTN (hypertension)   . Hyperlipidemia   . Leg swelling   . Mobitz type 1 second degree atrioventricular block   . Pacemaker 05/22/2012   Dr  Croitoru    Medtronic Adapta  . 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 SVG to firts & second diagonals,SVG to forth posterolateral branch to RCA  . Shortness of breath   . Sore throat   . Wheezing     Current Outpatient Prescriptions on File Prior to Visit  Medication Sig Dispense Refill  . ALPRAZolam (XANAX) 0.5 MG  tablet Take 0.5 mg by mouth daily.    Marland Kitchen amLODipine (NORVASC) 10 MG tablet Take 1 tablet (10 mg total) by mouth daily. 90 tablet 3  . aspirin 81 MG tablet Take 81 mg by mouth daily.      . calcium carbonate (OS-CAL - DOSED IN MG OF ELEMENTAL CALCIUM) 1250 (500 Ca) MG tablet Take 1 tablet by mouth daily with breakfast.    . carvedilol (COREG) 12.5 MG tablet Take 1 tablet (12.5 mg total) by mouth 2 (two) times daily with a meal. 180 tablet 3  . Cyanocobalamin (B-12) 1000 MCG/ML KIT Inject 1 Dose as directed every 30 (thirty) days.     . furosemide (LASIX) 40 MG tablet Take 1 tablet by mouth  daily 90 tablet 3  . Iron Combinations (IRON COMPLEX) CAPS Take 1 tablet by mouth daily.     Marland Kitchen lisinopril (PRINIVIL,ZESTRIL) 20 MG tablet Take 1 tablet (20 mg total) by mouth 2 (two) times daily. 180 tablet 1  . metFORMIN (GLUCOPHAGE) 1000 MG tablet Take 500 mg by mouth 2 (two) times daily with a meal.     . ONETOUCH VERIO test  strip daily. As directed  4  . potassium chloride SA (K-DUR,KLOR-CON) 20 MEQ tablet TAKE 1 TABLET BY MOUTH DAILY 30 tablet 10  . pravastatin (PRAVACHOL) 80 MG tablet Take 80 mg by mouth daily.    . traZODone (DESYREL) 50 MG tablet Take 50-100 mg by mouth at bedtime as needed for sleep.     No current facility-administered medications on file prior to visit.     No Known Allergies Assessment/Plan: BP well controlled on current regimen.  Patient due for follow up with Dr. Debara Pickett in 1 month.  Patient knows to call with any concerns or problems in the meantime.    Thank you, Tommy Medal PharmD CPP  [facility]  10/30/2015 7:25 AM

## 2015-10-30 NOTE — Patient Instructions (Signed)
Return for a a follow up appointment with Dr. Debara Pickett in September  Your blood pressure today is 120/62  (goal is < 140/90)  Take your BP meds as follows: continue with all your current medications  Bring all of your meds to your next appointment.  Exercise as you're able, try to walk approximately 30 minutes per day.  Keep salt intake to a minimum, especially watch canned and prepared boxed foods.  Eat more fresh fruits and vegetables and fewer canned items.  Avoid eating in fast food restaurants.    HOW TO TAKE YOUR BLOOD PRESSURE: . Rest 5 minutes before taking your blood pressure. .  Don't smoke or drink caffeinated beverages for at least 30 minutes before. . Take your blood pressure before (not after) you eat. . Sit comfortably with your back supported and both feet on the floor (don't cross your legs). . Elevate your arm to heart level on a table or a desk. . Use the proper sized cuff. It should fit smoothly and snugly around your bare upper arm. There should be enough room to slip a fingertip under the cuff. The bottom edge of the cuff should be 1 inch above the crease of the elbow. . Ideally, take 3 measurements at one sitting and record the average.

## 2015-10-30 NOTE — Assessment & Plan Note (Signed)
BP well controlled on current regimen.  Patient due for follow up with Dr. Debara Pickett in 1 month.  Patient knows to call with any concerns or problems in the meantime.

## 2015-11-27 ENCOUNTER — Encounter: Payer: Self-pay | Admitting: Internal Medicine

## 2015-11-27 ENCOUNTER — Ambulatory Visit (INDEPENDENT_AMBULATORY_CARE_PROVIDER_SITE_OTHER): Payer: Medicare Other | Admitting: Internal Medicine

## 2015-11-27 VITALS — BP 120/70 | HR 76 | Ht 71.0 in | Wt 218.6 lb

## 2015-11-27 DIAGNOSIS — I5022 Chronic systolic (congestive) heart failure: Secondary | ICD-10-CM | POA: Diagnosis not present

## 2015-11-27 DIAGNOSIS — Z951 Presence of aortocoronary bypass graft: Secondary | ICD-10-CM

## 2015-11-27 DIAGNOSIS — I10 Essential (primary) hypertension: Secondary | ICD-10-CM | POA: Diagnosis not present

## 2015-11-27 DIAGNOSIS — Z950 Presence of cardiac pacemaker: Secondary | ICD-10-CM

## 2015-11-27 DIAGNOSIS — I4892 Unspecified atrial flutter: Secondary | ICD-10-CM | POA: Diagnosis not present

## 2015-11-27 NOTE — Progress Notes (Signed)
OFFICE NOTE  Chief Complaint:  Occasional lightheadedness  Primary Care Physician: Anthoney Harada, MD  HPI:  Jesus Oconnor is a 82 year old gentleman who we have been following closely with a history of 5-vessel CABG and atrial flutter in 2012. He has done well with lack of chest pain; however, he says he does have low energy which has been pretty much stable, not necessarily improved after surgery. He also has significant abnormalities in his EKG as well indicating a conduction delay. When his episode of atrial flutter was occurring his ventricular response was only in the 40s. He was also noted on EKG to have trifascicular block and periods of Wenckebach. Today, he actually had some more Wenckebach and couplets as well as continued trifascicular block. He has had paroxysmal A-flutter and we have kept him off a beta blocker for those reasons. I placed him on a monitor which demonstrated significant bradycardia for which she was symptomatic and ultimately underwent a permanent pacemaker. He reports that this has helped him significantly and recently followed up with Dr. Loletha Grayer in the office. The pacemaker was interrogated and apparently is working properly. Jesus Oconnor has no significant complaints other than some right knee pain and itchiness and tenderness along the lower aspect of his midline sternal incision, where he has a keloid scar.  Of note he recently had acute cholecystitis and is status post cholecystectomy.  Jesus Oconnor returns for followup today. He saw Dr. Loletha Grayer last week for pacemaker check and it seems to be working appropriately. He has had some problems with heart failure. He had fluid gain and it turns out that he was not taking his Lasix on a scheduled basis. He since restarted taking that every day and is feeling much better. He has no complaints of shortness of breath or other symptoms.  08/19/2015  Jesus Oconnor was seen today back in the office for follow-up. I had  last seen him in 2015 and he was recently seen by Dr. Loletha Grayer in the office. This was for device check at which time he was noted to have some worsening fatigue. He is also noted to have some leg swelling. An echocardiogram was performed and unfortunately shows a marked reduction in LVEF to 30-35%. The ventricle was noted to be mildly dilated with diffuse hypokinesis and grade 2 diastolic dysfunction. He is currently on Lasix 40 mg daily and reports his weight is been fairly stable. He denies any orthopnea, PND or significant worsening of dyspnea on exertion. He has reported however fatigue and also denies any chest pain. Blood pressure is elevated today 170/80. When discussing about possible causes of his cardiomyopathy, he does report that he is using at least a moderate amount of alcohol and drinks 3-4 beers at least 3 times a week.  09/09/2015  Jesus Oconnor returns to follow-up his recent heart catheterization. This demonstrated the following:   Mild global LV dysfunction with an ejection fraction of 45-50%. There is significant mitral annular calcification and trace mitral regurgitation.  Mild pulmonary hypertension.  Significant native CAD with 20% mid left main narrowing; diffuse 60-70% proximal and 70% mid LAD stenoses; total occlusion of the proximal circumflex coronary artery with faint antegrade collaterals; and diffusely irregular RCA with mild calcification without apparent high-grade stenosis.  Patent LIMA graft supplying the mid LAD.  Patent sequential vein graft supplying the diagonal 1 and diagonal 2 vessel with mild 30% narrowing in the region of a valve in the proximal portion of the graft.  Patent vein graft supplying the distal circumflex marginal vessel with smooth 40% narrowing proximally in the region of a valve.  Patent vein graft supplying the distal posterolateral branch of the RCA.  Based on these findings medical therapy was recommended. His filling pressures seem to be  well-controlled. EF was interestingly reported much higher on this study than it was on his echocardiogram. I had recently increased his lisinopril to 20 mg twice a day. We discussed management options today and I feel that he could use better blood pressure control. In addition he recently stopped alcohol completely check commended him on.  11/27/2015  Jesus Oconnor returns today for follow-up. After some medication adjustments his blood pressure is now much better controlled 120/70. He denies any chest pain or worsening shortness of breath. We've been continuing to up titrate his medications for heart failure. He's also completely stopped alcohol use. He is due for a remote pacemaker check which is coming up fairly soon. His pacemaker is now been in place for about 4 years. It seems to be AV pacing today.  PMHx:  Past Medical History:  Diagnosis Date  . Atrial flutter (HCC)    severe bradycardia  . CHF (congestive heart failure) (HCC)   . Constipation   . COPD (chronic obstructive pulmonary disease) (HCC)   . Coronary artery disease   . Cough   . Diarrhea   . Diastolic dysfunction    Echo 09/11/2010-grade I diastolic dysfunction,EF 55-60%  . DM2 (diabetes mellitus, type 2) (HCC)   . Dyslipidemia   . HTN (hypertension)   . Hyperlipidemia   . Leg swelling   . Mobitz type 1 second degree atrioventricular block   . Pacemaker 05/22/2012   Dr  Croitoru    Medtronic Adapta  . 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 SVG to firts & second diagonals,SVG to forth posterolateral branch to RCA  . Shortness of breath   . Sore throat   . Wheezing     Past Surgical History:  Procedure Laterality Date  . CABG x5  09/13/2010   Dr Heriberto [place]ig[place] to LAD,SVG to obtuse marginal,seq. SVG to first & second diagonals,SVG to fourth posterolateral branch RCA  . CARDIAC CATHETERIZATION  09/10/2010   severe LAD,D1,D2 & ostial D2  . CARDIAC CATHETERIZATION N/A 08/28/2015   Procedure:  Right/Left Heart Cath and Coronary/Graft Angiography;  Surgeon: Lennette Bihari, MD;  Location: [facility] INVASIVE CV [facility];  Service: Cardiovascular;  Laterality: N/A;  . CHOLECYSTECTOMY  october 2012  . CORONARY ARTERY BYPASS GRAFT    . INSERT / REPLACE / REMOVE PACEMAKER  05/22/2012    dual chamber  . PERMANENT PACEMAKER INSERTION N/A 05/22/2012   Procedure: PERMANENT PACEMAKER INSERTION;  Surgeon: Thurmon Fair, MD;  Location: [facility] CATH LAB;  Service: Cardiovascular;  Laterality: N/A;  . repair of left indirect inguinal hernia with mesh.    . repair of umbilical hernia.      FAMHx:  Family History  Problem Relation Age of Onset  . Heart disease Father   . Heart disease Brother     s/p cabg  . Heart disease Maternal Uncle     SOCHx:   reports that he quit smoking about 5 years ago. His smoking use included Cigarettes. He quit after 40.00 years of use. He has never used smokeless tobacco. He reports that he drinks alcohol. He reports that he does not use drugs.  ALLERGIES:  No Known Allergies  ROS: A comprehensive review  of systems was negative.  HOME MEDS: Current Outpatient Prescriptions  Medication Sig Dispense Refill  . ALPRAZolam (XANAX) 0.5 MG tablet Take 0.5 mg by mouth daily.    Marland Kitchen amLODipine (NORVASC) 10 MG tablet Take 1 tablet (10 mg total) by mouth daily. 90 tablet 3  . aspirin 81 MG tablet Take 81 mg by mouth daily.      . calcium carbonate (OS-CAL - DOSED IN MG OF ELEMENTAL CALCIUM) 1250 (500 Ca) MG tablet Take 1 tablet by mouth daily with breakfast.    . carvedilol (COREG) 12.5 MG tablet Take 1 tablet (12.5 mg total) by mouth 2 (two) times daily with a meal. 180 tablet 3  . Cyanocobalamin (B-12) 1000 MCG/ML KIT Inject 1 Dose as directed every 30 (thirty) days.     . furosemide (LASIX) 40 MG tablet Take 1 tablet by mouth  daily 90 tablet 3  . Iron Combinations (IRON COMPLEX) CAPS Take 1 tablet by mouth daily.     Marland Kitchen lisinopril (PRINIVIL,ZESTRIL) 20 MG tablet Take 1 tablet (20  mg total) by mouth 2 (two) times daily. 180 tablet 1  . metFORMIN (GLUCOPHAGE) 1000 MG tablet Take 500 mg by mouth 2 (two) times daily with a meal.     . ONETOUCH VERIO test strip daily. As directed  4  . potassium chloride SA (K-DUR,KLOR-CON) 20 MEQ tablet TAKE 1 TABLET BY MOUTH DAILY 30 tablet 10  . pravastatin (PRAVACHOL) 80 MG tablet Take 80 mg by mouth daily.    . traZODone (DESYREL) 50 MG tablet Take 50-100 mg by mouth at bedtime as needed for sleep.     No current facility-administered medications for this visit.     LABS/IMAGING: No results found for this or any previous visit (from the past 48 hour(s)). No results found.  VITALS: BP 120/70 (BP Location: Right Arm, Patient Position: Sitting, Cuff Size: Normal)   Pulse 76   Ht '5\' 11"'$  (1.803 m)   Wt 218 lb 9.6 oz (99.2 kg)   BMI 30.49 kg/m   EXAM: General appearance: alert and no distress Neck: no carotid bruit and no JVD Lungs: clear to auscultation bilaterally Heart: regular rate and rhythm, S1, S2 normal, no murmur, click, rub or gallop Abdomen: soft, non-tender; bowel sounds normal; no masses,  no organomegaly Extremities: extremities normal, atraumatic, no cyanosis or edema Pulses: 2+ and symmetric Skin: Skin color, texture, turgor normal. No rashes or lesions Neurologic: Grossly normal Psych: Pleasant  EKG: AV paced rhythm at 76  ASSESSMENT: 1. Cardiomyopathy with newly reduced EF to 30-35% - no new obstructive disease by cath, EF 45-50% 2. 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 Short episode of paroxysmal atrial fibrillation in the setting of acute cholecystitis, without significant recurrence on aspirin 4. Hypertension-controlled 5. Dyslipidemia-at goal 6. CABG x 5 vessels (2012) 7. Recent moderate alcohol abuse-now abstinent  PLAN: 1.   Jesus Oconnor has had improvement in his blood pressure and we transitioned his  medications to treat for heart failure. EF has improved up to 45-50%. I believe his alcohol abstinence is also helping with this. He is due for another remote pacer check in November. I'll follow up on that and plan to follow-up with me in 6 months. No changes to his medications today.  Pixie Casino, MD, [facility] Attending Cardiologist [place] [facility] Odwyer 11/27/2015, 8:21 AM

## 2015-11-27 NOTE — Patient Instructions (Addendum)
Medication Instructions:  Your physician recommends that you continue on your current medications as directed. Please refer to the Current Medication list given to you today.  Labwork: None Ordered  Testing/Procedures: None Ordered  Follow-Up: Your physician wants you to follow-up in: 6 months with Dr Debara Pickett. You will receive a reminder letter in the mail two months in advance. If you don't receive a letter, please call our office to schedule the follow-up appointment.  Any Other Special Instructions Will Be Listed Below (If Applicable).    If you need a refill on your cardiac medications before your next appointment, please call your pharmacy.

## 2015-12-09 ENCOUNTER — Other Ambulatory Visit: Payer: Self-pay | Admitting: Internal Medicine

## 2016-01-11 ENCOUNTER — Telehealth: Payer: Self-pay | Admitting: Cardiology

## 2016-01-11 ENCOUNTER — Encounter: Payer: Medicare Other | Admitting: *Deleted

## 2016-01-11 NOTE — Telephone Encounter (Signed)
LMOVM reminding pt to send remote transmission.   

## 2016-01-15 ENCOUNTER — Encounter: Payer: Self-pay | Admitting: Cardiology

## 2016-01-15 ENCOUNTER — Ambulatory Visit (INDEPENDENT_AMBULATORY_CARE_PROVIDER_SITE_OTHER): Payer: Medicare Other | Admitting: *Deleted

## 2016-01-15 DIAGNOSIS — I441 Atrioventricular block, second degree: Secondary | ICD-10-CM | POA: Diagnosis not present

## 2016-01-15 NOTE — Progress Notes (Signed)
Letter  

## 2016-01-15 NOTE — Progress Notes (Signed)
Remote pacemaker transmission.   

## 2016-02-11 LAB — CUP PACEART REMOTE DEVICE CHECK
Brady Statistic AP VS Percent: 0 %
Brady Statistic AS VP Percent: 11 %
Brady Statistic AS VS Percent: 0 %
Date Time Interrogation Session: 20171110163403
Implantable Lead Location: 753860
Implantable Lead Model: 5076
Lead Channel Impedance Value: 511 Ohm
Lead Channel Impedance Value: 931 Ohm
Lead Channel Pacing Threshold Amplitude: 0.625 V
Lead Channel Pacing Threshold Amplitude: 0.625 V
Lead Channel Pacing Threshold Pulse Width: 0.4 ms
Lead Channel Pacing Threshold Pulse Width: 0.4 ms
MDC IDC LEAD IMPLANT DT: 20140318
MDC IDC LEAD IMPLANT DT: 20140318
MDC IDC LEAD LOCATION: 753859
MDC IDC MSMT BATTERY IMPEDANCE: 231 Ohm
MDC IDC MSMT BATTERY REMAINING LONGEVITY: 108 mo
MDC IDC MSMT BATTERY VOLTAGE: 2.8 V
MDC IDC PG IMPLANT DT: 20140318
MDC IDC SET LEADCHNL RA PACING AMPLITUDE: 2 V
MDC IDC SET LEADCHNL RV PACING AMPLITUDE: 2.5 V
MDC IDC SET LEADCHNL RV PACING PULSEWIDTH: 0.4 ms
MDC IDC SET LEADCHNL RV SENSING SENSITIVITY: 2.8 mV
MDC IDC STAT BRADY AP VP PERCENT: 89 %

## 2016-04-06 ENCOUNTER — Other Ambulatory Visit: Payer: Self-pay | Admitting: Internal Medicine

## 2016-04-18 ENCOUNTER — Telehealth: Payer: Self-pay | Admitting: Cardiology

## 2016-04-18 ENCOUNTER — Ambulatory Visit (INDEPENDENT_AMBULATORY_CARE_PROVIDER_SITE_OTHER): Payer: Medicare Other | Admitting: *Deleted

## 2016-04-18 DIAGNOSIS — I441 Atrioventricular block, second degree: Secondary | ICD-10-CM | POA: Diagnosis not present

## 2016-04-18 NOTE — Progress Notes (Signed)
Remote pacemaker transmission.   

## 2016-04-18 NOTE — Telephone Encounter (Signed)
LMOVM reminding pt to send remote transmission.   

## 2016-04-19 ENCOUNTER — Encounter: Payer: Self-pay | Admitting: Cardiology

## 2016-04-19 LAB — CUP PACEART REMOTE DEVICE CHECK
Battery Remaining Longevity: 104 mo
Battery Voltage: 2.79 V
Brady Statistic AP VS Percent: 0 %
Brady Statistic AS VP Percent: 11 %
Implantable Lead Implant Date: 20140318
Implantable Lead Location: 753859
Implantable Lead Location: 753860
Implantable Lead Model: 5076
Implantable Pulse Generator Implant Date: 20140318
Lead Channel Pacing Threshold Amplitude: 0.625 V
Lead Channel Pacing Threshold Amplitude: 0.625 V
Lead Channel Pacing Threshold Pulse Width: 0.4 ms
Lead Channel Setting Pacing Amplitude: 2 V
Lead Channel Setting Pacing Pulse Width: 0.4 ms
MDC IDC LEAD IMPLANT DT: 20140318
MDC IDC MSMT BATTERY IMPEDANCE: 255 Ohm
MDC IDC MSMT LEADCHNL RA IMPEDANCE VALUE: 505 Ohm
MDC IDC MSMT LEADCHNL RA PACING THRESHOLD PULSEWIDTH: 0.4 ms
MDC IDC MSMT LEADCHNL RV IMPEDANCE VALUE: 931 Ohm
MDC IDC SESS DTM: 20180212212854
MDC IDC SET LEADCHNL RV PACING AMPLITUDE: 2.5 V
MDC IDC SET LEADCHNL RV SENSING SENSITIVITY: 2.8 mV
MDC IDC STAT BRADY AP VP PERCENT: 89 %
MDC IDC STAT BRADY AS VS PERCENT: 0 %

## 2016-05-25 ENCOUNTER — Telehealth: Payer: Self-pay | Admitting: Internal Medicine

## 2016-05-31 ENCOUNTER — Ambulatory Visit: Payer: Medicare Other | Admitting: Internal Medicine

## 2016-06-03 ENCOUNTER — Encounter: Payer: Self-pay | Admitting: Internal Medicine

## 2016-06-03 ENCOUNTER — Ambulatory Visit (INDEPENDENT_AMBULATORY_CARE_PROVIDER_SITE_OTHER): Payer: Medicare Other | Admitting: Internal Medicine

## 2016-06-03 VITALS — BP 124/70 | HR 75 | Ht 71.0 in | Wt 213.0 lb

## 2016-06-03 DIAGNOSIS — Z950 Presence of cardiac pacemaker: Secondary | ICD-10-CM

## 2016-06-03 DIAGNOSIS — I471 Supraventricular tachycardia: Secondary | ICD-10-CM | POA: Diagnosis not present

## 2016-06-03 DIAGNOSIS — Z951 Presence of aortocoronary bypass graft: Secondary | ICD-10-CM | POA: Diagnosis not present

## 2016-06-03 DIAGNOSIS — I2510 Atherosclerotic heart disease of native coronary artery without angina pectoris: Secondary | ICD-10-CM | POA: Diagnosis not present

## 2016-06-03 NOTE — Patient Instructions (Signed)
Dr. Debara Pickett has ordered compression stockings  -- bilateral -- knee-high -- 20-35mmHg (compression strength)  Your physician wants you to follow-up in: 6 months with Dr. Debara Pickett. You will receive a reminder letter in the mail two months in advance. If you don't receive a letter, please call our office to schedule the follow-up appointment.   How to Use Compression Stockings Compression stockings are elastic socks that squeeze the legs. They help to increase blood flow to the legs, decrease swelling in the legs, and reduce the chance of developing blood clots in the lower legs. Compression stockings are often used by people who:  Are recovering from surgery.  Have poor circulation in their legs.  Are prone to getting blood clots in their legs.  Have varicose veins.  Sit or stay in bed for long periods of time. How to use compression stockings Before you put on your compression stockings:  Make sure that they are the correct size. If you do not know your size, ask your health care provider.  Make sure that they are clean, dry, and in good condition.  Check them for rips and tears. Do not put them on if they are ripped or torn. Put your stockings on first thing in the morning, before you get out of bed. Keep them on for as long as your health care provider advises. When you are wearing your stockings:  Keep them as smooth as possible. Do not allow them to bunch up. It is especially important to prevent the stockings from bunching up around your toes or behind your knees.  Do not roll the stockings downward and leave them rolled down. This can decrease blood flow to your leg.  Change them right away if they become wet or dirty. 1.  When you take off your stockings, inspect your legs and feet. Anything that does not seem normal may require medical attention. Look for:  Open sores.  Red spots.  Swelling. Information and tips  Do not stop wearing your compression stockings without  talking to your health care provider first.  Wash your stockings every day with mild detergent in cold or warm water. Do not use bleach. Air-dry your stockings or dry them in a clothes dryer on low heat.  Replace your stockings every 3-6 months.  If skin moisturizing is part of your treatment plan, apply lotion or cream at night so that your skin will be dry when you put on the stockings in the morning. It is harder to put the stockings on when you have lotion on your legs or feet. Contact a health care provider if: Remove your stockings and seek medical care if:  You have a feeling of pins and needles in your feet or legs.  You have any new changes in your skin.  You have skin lesions that are getting worse.  You have swelling or pain that is getting worse. Get help right away if:  You have numbness or tingling in your lower legs that does not get better right after you take the stockings off.  Your toes or feet become cold and blue.  You develop open sores or red spots on your legs that do not go away.  You see or feel a warm spot on your leg.  You have new swelling or soreness in your leg.  You are short of breath or you have chest pain for no reason.  You have a rapid or irregular heartbeat.  You feel light-headed or dizzy. This  information is not intended to replace advice given to you by your health care provider. Make sure you discuss any questions you have with your health care provider. Document Released: 12/19/2008 Document Revised: 07/22/2015 Document Reviewed: 01/29/2014 Elsevier Interactive Patient Education  2017 [employer].

## 2016-06-06 NOTE — Progress Notes (Signed)
OFFICE NOTE  Chief Complaint:  No complaints  Primary Care Physician: Anthoney Harada, MD  HPI:  Jesus Oconnor is a 83 year old gentleman who we have been following closely with a history of 5-vessel CABG and atrial flutter in 2012. He has done well with lack of chest pain; however, he says he does have low energy which has been pretty much stable, not necessarily improved after surgery. He also has significant abnormalities in his EKG as well indicating a conduction delay. When his episode of atrial flutter was occurring his ventricular response was only in the 40s. He was also noted on EKG to have trifascicular block and periods of Wenckebach. Today, he actually had some more Wenckebach and couplets as well as continued trifascicular block. He has had paroxysmal A-flutter and we have kept him off a beta blocker for those reasons. I placed him on a monitor which demonstrated significant bradycardia for which she was symptomatic and ultimately underwent a permanent pacemaker. He reports that this has helped him significantly and recently followed up with Dr. Loletha Grayer in the office. The pacemaker was interrogated and apparently is working properly. Jesus Oconnor has no significant complaints other than some right knee pain and itchiness and tenderness along the lower aspect of his midline sternal incision, where he has a keloid scar.  Of note he recently had acute cholecystitis and is status post cholecystectomy.  Jesus Oconnor returns for followup today. He saw Dr. Loletha Grayer last week for pacemaker check and it seems to be working appropriately. He has had some problems with heart failure. He had fluid gain and it turns out that he was not taking his Lasix on a scheduled basis. He since restarted taking that every day and is feeling much better. He has no complaints of shortness of breath or other symptoms.  08/19/2015  Jesus Oconnor was seen today back in the office for follow-up. I had last seen him  in 2015 and he was recently seen by Dr. Loletha Grayer in the office. This was for device check at which time he was noted to have some worsening fatigue. He is also noted to have some leg swelling. An echocardiogram was performed and unfortunately shows a marked reduction in LVEF to 30-35%. The ventricle was noted to be mildly dilated with diffuse hypokinesis and grade 2 diastolic dysfunction. He is currently on Lasix 40 mg daily and reports his weight is been fairly stable. He denies any orthopnea, PND or significant worsening of dyspnea on exertion. He has reported however fatigue and also denies any chest pain. Blood pressure is elevated today 170/80. When discussing about possible causes of his cardiomyopathy, he does report that he is using at least a moderate amount of alcohol and drinks 3-4 beers at least 3 times a week.  09/09/2015  Jesus Oconnor returns to follow-up his recent heart catheterization. This demonstrated the following:   Mild global LV dysfunction with an ejection fraction of 45-50%. There is significant mitral annular calcification and trace mitral regurgitation.  Mild pulmonary hypertension.  Significant native CAD with 20% mid left main narrowing; diffuse 60-70% proximal and 70% mid LAD stenoses; total occlusion of the proximal circumflex coronary artery with faint antegrade collaterals; and diffusely irregular RCA with mild calcification without apparent high-grade stenosis.  Patent LIMA graft supplying the mid LAD.  Patent sequential vein graft supplying the diagonal 1 and diagonal 2 vessel with mild 30% narrowing in the region of a valve in the proximal portion of the graft.  Patent vein graft supplying the distal circumflex marginal vessel with smooth 40% narrowing proximally in the region of a valve.  Patent vein graft supplying the distal posterolateral branch of the RCA.  Based on these findings medical therapy was recommended. His filling pressures seem to be well-controlled.  EF was interestingly reported much higher on this study than it was on his echocardiogram. I had recently increased his lisinopril to 20 mg twice a day. We discussed management options today and I feel that he could use better blood pressure control. In addition he recently stopped alcohol completely check commended him on.  11/27/2015  Jesus Oconnor returns today for follow-up. After some medication adjustments his blood pressure is now much better controlled 120/70. He denies any chest pain or worsening shortness of breath. We've been continuing to up titrate his medications for heart failure. He's also completely stopped alcohol use. He is due for a remote pacemaker check which is coming up fairly soon. His pacemaker is now been in place for about 4 years. It seems to be AV pacing today.  06/03/2016  Jesus Oconnor is seen today in follow-up. Overall he seems to be doing well. Weight is down about 5 pounds since I last saw him. Is no evidence for heart failure. His weight loss may be attributable to decreased appetite. Blood pressure is good at 124/70. EKG shows a paced rhythm at 75. He says he's had some episodes of palpitations mostly associated with alcohol use which he has significantly reduced if not stopped. He is reporting some bilateral lower extremity swelling. This seems to be worse when sitting for long periods of time and tends to go away almost completely when elevating his feet at night.  PMHx:  Past Medical History:  Diagnosis Date  . Atrial flutter (HCC)    severe bradycardia  . CHF (congestive heart failure) ([place])   . Constipation   . COPD (chronic obstructive pulmonary disease) ([place])   . Coronary artery disease   . Cough   . Diarrhea   . Diastolic dysfunction    Echo 11/08/7167-CVELF I diastolic dysfunction,EF 81-01%  . DM2 (diabetes mellitus, type 2) ([place])   . Dyslipidemia   . HTN (hypertension)   . Hyperlipidemia   . Leg swelling   . Mobitz type 1 second degree  atrioventricular block   . Pacemaker 05/22/2012   Dr  Croitoru    Medtronic Adapta  . 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 SVG to firts & second diagonals,SVG to forth posterolateral branch to RCA  . Shortness of breath   . Sore throat   . Wheezing     Past Surgical History:  Procedure Laterality Date  . CABG x5  09/13/2010   Dr Oconnor Conners to LAD,SVG to obtuse marginal,seq. SVG to first & second diagonals,SVG to fourth posterolateral branch RCA  . CARDIAC CATHETERIZATION  09/10/2010   severe LAD,D1,D2 & ostial D2  . CARDIAC CATHETERIZATION N/A 08/28/2015   Procedure: Right/Left Heart Cath and Coronary/Graft Angiography;  Surgeon: Troy Sine, MD;  Location: [facility];  Service: Cardiovascular;  Laterality: N/A;  . CHOLECYSTECTOMY  october 2012  . CORONARY ARTERY BYPASS GRAFT    . INSERT / REPLACE / REMOVE PACEMAKER  05/22/2012    dual chamber  . PERMANENT PACEMAKER INSERTION N/A 05/22/2012   Procedure: PERMANENT PACEMAKER INSERTION;  Surgeon: Sanda Klein, MD;  Location: [facility];  Service: Cardiovascular;  Laterality: N/A;  . repair of left  indirect inguinal hernia with mesh.    . repair of umbilical hernia.      FAMHx:  Family History  Problem Relation Age of Onset  . Heart disease Father   . Heart disease Brother     s/p cabg  . Heart disease Maternal Uncle     SOCHx:   reports that he quit smoking about 5 years ago. His smoking use included Cigarettes. He quit after 40.00 years of use. He has never used smokeless tobacco. He reports that he drinks alcohol. He reports that he does not use drugs.  ALLERGIES:  No Known Allergies  ROS: Pertinent items noted in HPI and remainder of comprehensive ROS otherwise negative.  HOME MEDS: Current Outpatient Prescriptions  Medication Sig Dispense Refill  . aspirin 81 MG tablet Take 81 mg by mouth daily.      . calcium carbonate (OS-CAL - DOSED IN MG OF ELEMENTAL CALCIUM) 1250 (500 Ca) MG  tablet Take 1 tablet by mouth daily with breakfast.    . carvedilol (COREG) 12.5 MG tablet Take 1 tablet (12.5 mg total) by mouth 2 (two) times daily with a meal. 180 tablet 3  . Cyanocobalamin (B-12) 1000 MCG/ML KIT Inject 1 Dose as directed every 30 (thirty) days.     . furosemide (LASIX) 40 MG tablet Take 1 tablet by mouth  daily 90 tablet 3  . Iron Combinations (IRON COMPLEX) CAPS Take 1 tablet by mouth daily.     Marland Kitchen lisinopril (PRINIVIL,ZESTRIL) 20 MG tablet TAKE 1 TABLET BY MOUTH TWO  TIMES DAILY 180 tablet 0  . metFORMIN (GLUCOPHAGE) 1000 MG tablet Take 500 mg by mouth 2 (two) times daily with a meal.     . ONETOUCH VERIO test strip daily. As directed  4  . potassium chloride SA (K-DUR,KLOR-CON) 20 MEQ tablet TAKE 1 TABLET BY MOUTH DAILY 30 tablet 10  . pravastatin (PRAVACHOL) 80 MG tablet Take 10 mg by mouth daily.     . traZODone (DESYREL) 50 MG tablet Take 50-100 mg by mouth at bedtime as needed for sleep.    Marland Kitchen amLODipine (NORVASC) 10 MG tablet Take 1 tablet (10 mg total) by mouth daily. 90 tablet 3   No current facility-administered medications for this visit.     LABS/IMAGING: No results found for this or any previous visit (from the past 48 hour(s)). No results found.  VITALS: BP 124/70   Pulse 75   Ht '5\' 11"'$  (1.803 m)   Wt 213 lb (96.6 kg)   BMI 29.71 kg/m   EXAM: General appearance: alert and no distress Neck: no carotid bruit and no JVD Lungs: clear to auscultation bilaterally Heart: regular rate and rhythm, S1, S2 normal, no murmur, click, rub or gallop Abdomen: soft, non-tender; bowel sounds normal; no masses,  no organomegaly Extremities: extremities normal, atraumatic, no cyanosis or edema Pulses: 2+ and symmetric Skin: Skin color, texture, turgor normal. No rashes or lesions Neurologic: Grossly normal Psych: Pleasant  EKG: AV paced rhythm at 75  ASSESSMENT: 1. Cardiomyopathy with newly reduced EF to 30-35% - no new obstructive disease by cath, EF  45-50% 2. 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 Short episode of paroxysmal atrial fibrillation in the setting of acute cholecystitis, without significant recurrence on aspirin - recent short bursts of atrial tachy 4. Hypertension-controlled 5. Dyslipidemia-at goal 6. CABG x 5 vessels (2012) 7. Recent moderate alcohol abuse-now abstinent 8. Dependent edema  PLAN:  1.  Jesus Oconnor seems to be doing fairly well without any significant recurrent episodes of paroxysmal tachycardia. Blood pressure is controlled and cholesterol is at goal. It seems like for the most part he has stayed off of alcohol. His pacemaker will be reassessed by Dr. Loletha Grayer next month. He is reporting some dependent lower extremity edema. I'll provide a prescription for compression stockings. Follow-up with me in 6 months.  Pixie Casino, MD, [facility] Attending Cardiologist [place] 06/06/2016, 1:41 PM

## 2016-06-09 NOTE — Telephone Encounter (Signed)
close

## 2016-07-13 ENCOUNTER — Encounter: Payer: Self-pay | Admitting: Cardiovascular Disease

## 2016-07-13 ENCOUNTER — Ambulatory Visit (INDEPENDENT_AMBULATORY_CARE_PROVIDER_SITE_OTHER): Payer: Medicare Other | Admitting: Cardiovascular Disease

## 2016-07-13 VITALS — BP 128/60 | HR 62 | Ht 71.0 in | Wt 212.0 lb

## 2016-07-13 DIAGNOSIS — I471 Supraventricular tachycardia: Secondary | ICD-10-CM | POA: Diagnosis not present

## 2016-07-13 DIAGNOSIS — Z950 Presence of cardiac pacemaker: Secondary | ICD-10-CM

## 2016-07-13 DIAGNOSIS — Z01810 Encounter for preprocedural cardiovascular examination: Secondary | ICD-10-CM

## 2016-07-13 DIAGNOSIS — I441 Atrioventricular block, second degree: Secondary | ICD-10-CM

## 2016-07-13 DIAGNOSIS — I10 Essential (primary) hypertension: Secondary | ICD-10-CM

## 2016-07-13 DIAGNOSIS — I5022 Chronic systolic (congestive) heart failure: Secondary | ICD-10-CM

## 2016-07-13 DIAGNOSIS — I2510 Atherosclerotic heart disease of native coronary artery without angina pectoris: Secondary | ICD-10-CM | POA: Diagnosis not present

## 2016-07-13 NOTE — Progress Notes (Signed)
Patient ID: Jesus Oconnor, male   DOB: 09-20-1933, 83 y.o.   MRN: [id number]     Cardiology Office Note    Date:  07/14/2016   ID:  Jesus Oconnor, DOB 06-07-1933, MRN [id number]  PCP:  Jesus Shanks, MD  Cardiologist:   Sanda Klein, MD   Chief Complaint  Patient presents with  . Follow-up    CHF, CAD, AV block, pacemaker     History of Present Illness:  Jesus Oconnor is a 83 y.o. 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 He has had repeated problems with paroxysmal atrial tachycardia (possible brief flutter) and his pacemaker has recorded occasional nonsustained ventricular tachycardia.  He denies any cardiovascular complaints and specifically has not had angina, worsening dyspnea, palpitations, syncope or leg edema. Swelling occurs in his joints, rather than true subcutaneous edema. He is contemplating right total knee replacement for severe arthritis. He is followed at [facility] and as has no benefit from injections of steroids or hyaluronic acid.  Last year he developed edema and shortness of breath and echocardiography showed an EF had dropped to 30-35 %. On 08/28/2015 he underwent right and left heart catheterization. Estimated LVEF was better at 45-50 %. He had severe native coronary disease, but all his bypasses (LIMA to LAD, SVG sequential diagonal 1-diagonal 2, SVG to distal circumflex, SVG to PLA branch of RCA) were widely patent. Medical therapy was recommended.  He saw Dr. Debara Pickett in March and seemed to be well compensated from a heart failure point of view, other than some mild lower extremity edema. He was prescribed compression stockings.  Normal pacemaker check performed today. This  showed battery longevity is estimated at 8.5 years, 88% atrial pacing, 99.8% ventricular pacing. No recent episodes of atrial mode switch or ventricular tachycardia. Last significant episode of PAT was in December 2016.  Does not have an escape rhythm today when ventricular pacing is taken down to 30 bpm. He seems to have progressed to complete heart block.  Past Medical History:  Diagnosis Date  . Atrial flutter (HCC)    severe bradycardia  . CHF (congestive heart failure) ([place])   . Constipation   . COPD (chronic obstructive pulmonary disease) ([place])   . Coronary artery disease   . Cough   . Diarrhea   . Diastolic dysfunction    Echo 04/13/7410-INOMV I diastolic dysfunction,EF 67-20%  . DM2 (diabetes mellitus, type 2) ([place])   . Dyslipidemia   . HTN (hypertension)   . Hyperlipidemia   . Leg swelling   . Mobitz type 1 second degree atrioventricular block   . Pacemaker 05/22/2012   Dr  Latorsha Curling    Medtronic Adapta  . 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 SVG to firts & second diagonals,SVG to forth posterolateral branch to RCA  . Shortness of breath   . Sore throat   . Wheezing     Past Surgical History:  Procedure Laterality Date  . CABG x5  09/13/2010   Dr Farrel Conners to LAD,SVG to obtuse marginal,seq. SVG to first & second diagonals,SVG to fourth posterolateral branch RCA  . CARDIAC CATHETERIZATION  09/10/2010   severe LAD,D1,D2 & ostial D2  . CARDIAC CATHETERIZATION N/A 08/28/2015   Procedure: Right/Left Heart Cath and Coronary/Graft Angiography;  Surgeon: Troy Sine, MD;  Location: [facility];  Service: Cardiovascular;  Laterality: N/A;  . CHOLECYSTECTOMY  october 2012  . CORONARY ARTERY BYPASS GRAFT    . INSERT / REPLACE / REMOVE PACEMAKER  05/22/2012    dual chamber  . PERMANENT PACEMAKER INSERTION N/A 05/22/2012   Procedure: PERMANENT PACEMAKER INSERTION;  Surgeon: Sanda Klein, MD;  Location: [facility];  Service: Cardiovascular;   Laterality: N/A;  . repair of left indirect inguinal hernia with mesh.    . repair of umbilical hernia.      Current Medications: Outpatient Medications Prior to Visit  Medication Sig Dispense Refill  . aspirin 81 MG tablet Take 81 mg by mouth daily.      . calcium carbonate (OS-CAL - DOSED IN MG OF ELEMENTAL CALCIUM) 1250 (500 Ca) MG tablet Take 1 tablet by mouth daily with breakfast.    . carvedilol (COREG) 12.5 MG tablet Take 1 tablet (12.5 mg total) by mouth 2 (two) times daily with a meal. 180 tablet 3  . Cyanocobalamin (B-12) 1000 MCG/ML KIT Inject 1 Dose as directed every 30 (thirty) days.     . furosemide (LASIX) 40 MG tablet Take 1 tablet by mouth  daily 90 tablet 3  . Iron Combinations (IRON COMPLEX) CAPS Take 1 tablet by mouth daily.     Marland Kitchen lisinopril (PRINIVIL,ZESTRIL) 20 MG tablet TAKE 1 TABLET BY MOUTH TWO  TIMES DAILY 180 tablet 0  . metFORMIN (GLUCOPHAGE) 1000 MG tablet Take 500 mg by mouth 2 (two) times daily with a meal.     . ONETOUCH VERIO test strip daily. As directed  4  . potassium chloride SA (K-DUR,KLOR-CON) 20 MEQ tablet TAKE 1 TABLET BY MOUTH DAILY 30 tablet 10  . pravastatin (PRAVACHOL) 80 MG tablet Take 10 mg by mouth daily.     . traZODone (DESYREL) 50 MG tablet Take 50-100 mg by mouth at bedtime as needed for sleep.    Marland Kitchen amLODipine (NORVASC) 10 MG tablet Take 1 tablet (10 mg total) by mouth daily. 90 tablet 3   No facility-administered medications prior to visit.      Allergies:   Patient has no known allergies.   Social History   Social History  . Marital status: Married    Spouse name: N/A  . Number of children: 2  . Years of education: N/A   Social History Main Topics  . Smoking status: Former Smoker    Years: 40.00    Types: Cigarettes    Quit date: 08/06/2010  . Smokeless tobacco: Never Used  . Alcohol use Yes     Comment: occasional  . Drug use: No  . Sexual activity: Not Asked   Other Topics Concern  . None   Social History Narrative   . None     Family History:  The patient's family history includes Heart disease in his brother, father, and maternal uncle.   ROS:   Please see the history of present illness.    ROS All other systems reviewed and are negative.   PHYSICAL EXAM:   VS:  BP 128/60   Pulse 62   Ht _0  (1.803 m)   Wt 96.2 kg (212 lb)   BMI 29.57 kg/m     General: Alert, oriented x3, no distress Head: no evidence of trauma, PERRL, EOMI, no exophtalmos or lid lag, no myxedema, no xanthelasma; normal ears, nose and oropharynx Neck: normal jugular venous pulsations and no hepatojugular reflux; brisk carotid  pulses without delay and no carotid bruits Chest: clear to auscultation, no signs of consolidation by percussion or palpation, normal fremitus, symmetrical and full respiratory excursions Cardiovascular: normal position and quality of the apical impulse, regular rhythm, normal first and Paradoxically split S2, no edema , healthy left subclavian pacemaker site, no murmurs, rubs or gallops Abdomen: no tenderness or distention, no masses by palpation, no abnormal pulsatility or arterial bruits, normal bowel sounds, no hepatosplenomegaly Extremities: no clubbing, cyanosis or edema; 2+ radial, ulnar and brachial pulses bilaterally; 2+ right femoral, posterior tibial and dorsalis pedis pulses; 2+ left femoral, posterior tibial and dorsalis pedis pulses; no subclavian or femoral bruits Neurological: grossly nonfocal, hard of hearing   Wt Readings from Last 3 Encounters:  07/13/16 96.2 kg (212 lb)  06/03/16 96.6 kg (213 lb)  11/27/15 99.2 kg (218 lb 9.6 oz)      Studies/Labs Reviewed:   EKG:  EKG is ordered today.  The ekg ordered today demonstrates AV sequential pacing  Recent Labs: 08/19/2015: BUN 9; Creat 0.86; Hemoglobin 14.2; Platelets 171; Potassium 4.4; Sodium 143; TSH 1.76   Lipid Panel    Component Value Date/Time   CHOL 176 09/10/2010 0623   TRIG 94 09/10/2010 0623   HDL 50 09/10/2010  0623   CHOLHDL 3.5 09/10/2010 0623   VLDL 19 09/10/2010 0623   LDLCALC 107 (H) 09/10/2010 1700     ASSESSMENT:    1. Chronic systolic CHF (congestive heart failure) ([place])   2. Coronary artery disease involving native coronary artery of native heart without angina pectoris   3. PAT (paroxysmal atrial tachycardia) ([place])   4. Essential hypertension   5. Second degree AV block, Mobitz type II   6. Pacemaker   7. Preoperative cardiovascular examination      PLAN:  In order of problems listed above:  1. CHF: Appears to be euvolemic, NYHA functional class I or 2, limited more by knee pain that cardiac complaints. 2. CAD s/p CABG: 6 years after surgery, he remains free of angina, patent grafts at cardiac catheterization a year ago. 3. PAT: None recorded in over a year 4. HTN: Good control today. 5. CHB: He has progressed from high-grade second-degree AV block to complete heart block and is pacemaker dependent. 6. PPM: Normal device function. May be a candidate for CRT upgrade if LVEF declines further  in the future. Continue remote downloads every 3 months and yearly office visits. 7. Preop CV eval: His heart failure and angina are under control and although he is pacemaker dependent, knee surgeries unlikely to interfere with normal device function. Should be able to undergo total knee replacement with acceptable risk if he decides to go this way   Medication Adjustments/Labs and Tests Ordered: Current medicines are reviewed at length with the patient today.  Concerns regarding medicines are outlined above.  Medication changes, Labs and Tests ordered today are listed in the Patient Instructions below. Patient Instructions  Dr Sallyanne Kuster recommends that you continue on your current medications as directed. Please refer to the Current Medication list given to you today.  Remote monitoring is used to monitor your Pacemaker or ICD from home. This monitoring reduces the number of office visits  required to check your device to one time per year. It allows [place] to keep an eye on the functioning of your device to ensure it is working properly. You are scheduled for a device check from home on Wednesday, August 8th, 2018. You may send your transmission at any time  that day. If you have a wireless device, the transmission will be sent automatically. After your physician reviews your transmission, you will receive a notification with your next transmission date.  Dr Sallyanne Kuster recommends that you schedule a follow-up appointment in 12 months with a pacemaker check. You will receive a reminder letter in the mail two months in advance. If you don't receive a letter, please call our office to schedule the follow-up appointment.  If you need a refill on your cardiac medications before your next appointment, please call your pharmacy.    Signed, Sanda Klein, MD  07/14/2016 1:39 PM    [facility] [place], [place], [place]  [address] Phone: [phone]; Fax: [phone]

## 2016-07-13 NOTE — Patient Instructions (Signed)

## 2016-07-15 ENCOUNTER — Other Ambulatory Visit: Payer: Self-pay

## 2016-07-18 LAB — CUP PACEART INCLINIC DEVICE CHECK
Battery Remaining Longevity: 101 mo
Battery Voltage: 2.79 V
Brady Statistic AP VP Percent: 88 %
Brady Statistic AP VS Percent: 0 %
Brady Statistic AS VS Percent: 0 %
Implantable Lead Implant Date: 20140318
Implantable Lead Location: 753859
Implantable Lead Location: 753860
Implantable Lead Model: 5076
Implantable Pulse Generator Implant Date: 20140318
Lead Channel Impedance Value: 496 Ohm
Lead Channel Pacing Threshold Amplitude: 0.5 V
Lead Channel Pacing Threshold Amplitude: 0.625 V
Lead Channel Pacing Threshold Amplitude: 1 V
Lead Channel Pacing Threshold Pulse Width: 0.4 ms
Lead Channel Pacing Threshold Pulse Width: 0.4 ms
Lead Channel Pacing Threshold Pulse Width: 0.4 ms
Lead Channel Sensing Intrinsic Amplitude: 4 mV
Lead Channel Setting Pacing Amplitude: 2 V
Lead Channel Setting Pacing Amplitude: 2.5 V
Lead Channel Setting Pacing Pulse Width: 0.4 ms
MDC IDC LEAD IMPLANT DT: 20140318
MDC IDC MSMT BATTERY IMPEDANCE: 279 Ohm
MDC IDC MSMT LEADCHNL RA PACING THRESHOLD AMPLITUDE: 0.75 V
MDC IDC MSMT LEADCHNL RA PACING THRESHOLD PULSEWIDTH: 0.4 ms
MDC IDC MSMT LEADCHNL RV IMPEDANCE VALUE: 874 Ohm
MDC IDC SESS DTM: 20180509123051
MDC IDC SET LEADCHNL RV SENSING SENSITIVITY: 2.8 mV
MDC IDC STAT BRADY AS VP PERCENT: 12 %

## 2016-08-04 ENCOUNTER — Other Ambulatory Visit: Payer: Self-pay | Admitting: Cardiovascular Disease

## 2016-08-04 ENCOUNTER — Other Ambulatory Visit: Payer: Self-pay | Admitting: Internal Medicine

## 2016-08-04 NOTE — Telephone Encounter (Signed)
REFILL 

## 2016-08-17 ENCOUNTER — Telehealth: Payer: Self-pay | Admitting: Internal Medicine

## 2016-08-17 NOTE — Telephone Encounter (Signed)
[facility] requests clearance for: 1. Type of surgery: right TKA 2. Date of surgery: pending clearance 3. Surgeon: Frederik Pear, MD 4. Medications that need to be held & how long: none specified 5. Fax and/or Phone: (f) [phone]  (p) [phone] -- Sandi Raveling (surgery coordinator)

## 2016-08-17 NOTE — Telephone Encounter (Signed)
See Dr. Lurline Del note from 5/9 - ok for knee surgery, acceptable risk.   Dr. Lemmie Evens

## 2016-08-18 NOTE — Telephone Encounter (Signed)
Clearance faxed to [facility] and [facility] via [employer].

## 2016-09-13 NOTE — Pre-Procedure Instructions (Signed)
Jesus Oconnor  09/13/2016      [facility] [place], [place] [address] Phone: [phone] Fax: [phone]  [facility] - [place], [place] - [address] AT [place] [address] Phone: [phone] Fax: [phone]  [place] [address] [place] [address] [place] [address] Phone: [phone] Fax: [phone]    Your procedure is scheduled on July 23  Report to [facility] at 8:30 A.M.  Call this number if you have problems the morning of surgery:  [phone]   Remember:  Do not eat food or drink liquids after midnight on July 22   Take these medicines the morning of surgery with A SIP OF WATER : amlodipine (norvasc), carvedilol(coreg), metoprolol tartrate (lopressor)             1 week prior to surgery stop: advil, motrin, aleve, ibuprofen, BC Powders, Goody's, vitamins and herbal medicines.     How to Manage Your Diabetes Before and After Surgery  Why is it important to control my blood sugar before and after surgery? . Improving blood sugar levels before and after surgery helps healing and can limit problems. . A way of improving blood sugar control is eating a healthy diet by: o  Eating less sugar and carbohydrates o  Increasing activity/exercise o  Talking with your doctor about reaching your blood sugar goals . High blood sugars (greater than 180 mg/dL) can raise your risk of infections and slow your recovery, so you will need to focus on controlling your diabetes during the weeks before surgery. . Make sure that the doctor who takes care of your diabetes knows about your planned surgery including the date and location.  How do I manage my blood sugar before surgery? . Check your blood sugar at least 4 times a day, starting 2 days before surgery, to make sure  that the level is not too high or low. o Check your blood sugar the morning of your surgery when you wake up and every 2 hours until you get to the Short Stay unit. . If your blood sugar is less than 70 mg/dL, you will need to treat for low blood sugar: o Do not take insulin. o Treat a low blood sugar (less than 70 mg/dL) with  cup of clear juice (cranberry or apple), 4 glucose tablets, OR glucose gel. o Recheck blood sugar in 15 minutes after treatment (to make sure it is greater than 70 mg/dL). If your blood sugar is not greater than 70 mg/dL on recheck, call [phone] for further instructions. . Report your blood sugar to the short stay nurse when you get to Short Stay.  . If you are admitted to the hospital after surgery: o Your blood sugar will be checked by the staff and you will probably be given insulin after surgery (instead of oral diabetes medicines) to make sure you have good blood sugar levels. o The goal for blood sugar control after surgery is 80-180 mg/dL.              WHAT DO I DO ABOUT MY DIABETES MEDICATION?   Marland Kitchen Do not take oral diabetes medicines (pills) the morning of surgery.    Do not wear jewelry, make-up or nail polish.  Do not wear lotions, powders, or  perfumes, or deoderant.  Do not shave 48 hours prior to surgery.  Men may shave face and neck.  Do not bring valuables to the hospital.  [facility] is not responsible for any belongings or valuables.  Contacts, dentures or bridgework may not be worn into surgery.  Leave your suitcase in the car.  After surgery it may be brought to your room.  For patients admitted to the hospital, discharge time will be determined by your treatment team.  Patients discharged the day of surgery will not be allowed to drive home.   Special instructions:  [facility]- Preparing For Surgery  Before surgery, you can play an important role. Because skin is not sterile, your skin needs to be as free of germs as  possible. You can reduce the number of germs on your skin by washing with CHG (chlorahexidine gluconate) Soap before surgery.  CHG is an antiseptic cleaner which kills germs and bonds with the skin to continue killing germs even after washing.  Please do not use if you have an allergy to CHG or antibacterial soaps. If your skin becomes reddened/irritated stop using the CHG.  Do not shave (including legs and underarms) for at least 48 hours prior to first CHG shower. It is OK to shave your face.  Please follow these instructions carefully.   1. Shower the NIGHT BEFORE SURGERY and the MORNING OF SURGERY with CHG.   2. If you chose to wash your hair, wash your hair first as usual with your normal shampoo.  3. After you shampoo, rinse your hair and body thoroughly to remove the shampoo.  4. Use CHG as you would any other liquid soap. You can apply CHG directly to the skin and wash gently with a scrungie or a clean washcloth.   5. Apply the CHG Soap to your body ONLY FROM THE NECK DOWN.  Do not use on open wounds or open sores. Avoid contact with your eyes, ears, mouth and genitals (private parts). Wash genitals (private parts) with your normal soap.  6. Wash thoroughly, paying special attention to the area where your surgery will be performed.  7. Thoroughly rinse your body with warm water from the neck down.  8. DO NOT shower/wash with your normal soap after using and rinsing off the CHG Soap.  9. Pat yourself dry with a CLEAN TOWEL.   10. Wear CLEAN PAJAMAS   11. Place CLEAN SHEETS on your bed the night of your first shower and DO NOT SLEEP WITH PETS.    Day of Surgery: Do not apply any deodorants/lotions. Please wear clean clothes to the hospital/surgery center.      Please read over the following fact sheets that you were given. Coughing and Deep Breathing, Total Joint Packet, MRSA Information and Surgical Site Infection Prevention

## 2016-09-14 ENCOUNTER — Ambulatory Visit (HOSPITAL_COMMUNITY)
Admission: RE | Admit: 2016-09-14 | Discharge: 2016-09-14 | Disposition: A | Discharge status: Home / Self Care | Payer: Medicare Other | Source: Ambulatory Visit | Attending: Orthopedic Surgery | Admitting: Orthopedic Surgery

## 2016-09-14 ENCOUNTER — Encounter (HOSPITAL_COMMUNITY): Payer: Self-pay

## 2016-09-14 ENCOUNTER — Encounter (HOSPITAL_COMMUNITY)
Admission: RE | Admit: 2016-09-14 | Discharge: 2016-09-14 | Disposition: A | Discharge status: Home / Self Care | Payer: Medicare Other | Source: Ambulatory Visit | Attending: Orthopedic Surgery | Admitting: Orthopedic Surgery

## 2016-09-14 DIAGNOSIS — M1711 Unilateral primary osteoarthritis, right knee: Secondary | ICD-10-CM | POA: Insufficient documentation

## 2016-09-14 DIAGNOSIS — Z01818 Encounter for other preprocedural examination: Secondary | ICD-10-CM | POA: Diagnosis present

## 2016-09-14 DIAGNOSIS — Z01812 Encounter for preprocedural laboratory examination: Secondary | ICD-10-CM | POA: Diagnosis present

## 2016-09-14 HISTORY — DX: Anemia, unspecified: D64.9

## 2016-09-14 HISTORY — DX: Unspecified osteoarthritis, unspecified site: M19.90

## 2016-09-14 HISTORY — DX: Personal history of urinary calculi: Z87.442

## 2016-09-14 LAB — APTT: aPTT: 32 seconds (ref 24–36)

## 2016-09-14 LAB — URINALYSIS, ROUTINE W REFLEX MICROSCOPIC
BILIRUBIN URINE: NEGATIVE
Bacteria, UA: NONE SEEN
GLUCOSE, UA: NEGATIVE mg/dL
Hgb urine dipstick: NEGATIVE
Ketones, ur: NEGATIVE mg/dL
LEUKOCYTES UA: NEGATIVE
NITRITE: NEGATIVE
PH: 6 (ref 5.0–8.0)
Protein, ur: 30 mg/dL — AB
SPECIFIC GRAVITY, URINE: 1.011 (ref 1.005–1.030)
SQUAMOUS EPITHELIAL / LPF: NONE SEEN

## 2016-09-14 LAB — BASIC METABOLIC PANEL
ANION GAP: 9 (ref 5–15)
BUN: 10 mg/dL (ref 6–20)
CALCIUM: 9.4 mg/dL (ref 8.9–10.3)
CHLORIDE: 107 mmol/L (ref 101–111)
CO2: 25 mmol/L (ref 22–32)
Creatinine, Ser: 0.85 mg/dL (ref 0.61–1.24)
GFR calc non Af Amer: >60 mL/min (ref >60)
GLUCOSE: 127 mg/dL — AB (ref 65–99)
Potassium: 4.9 mmol/L (ref 3.5–5.1)
Sodium: 141 mmol/L (ref 135–145)

## 2016-09-14 LAB — CBC WITH DIFFERENTIAL/PLATELET
Basophils Absolute: 0 10*3/uL (ref 0.0–0.1)
Basophils Relative: 1 %
Eosinophils Absolute: 0.1 10*3/uL (ref 0.0–0.7)
Eosinophils Relative: 1 %
HEMATOCRIT: 41.5 % (ref 39.0–52.0)
HEMOGLOBIN: 13.2 g/dL (ref 13.0–17.0)
LYMPHS ABS: 2.1 10*3/uL (ref 0.7–4.0)
LYMPHS PCT: 36 %
MCH: 30.9 pg (ref 26.0–34.0)
MCHC: 31.8 g/dL (ref 30.0–36.0)
MCV: 97.2 fL (ref 78.0–100.0)
MONO ABS: 0.5 10*3/uL (ref 0.1–1.0)
MONOS PCT: 8 %
NEUTROS ABS: 3.2 10*3/uL (ref 1.7–7.7)
NEUTROS PCT: 54 %
Platelets: 142 10*3/uL — ABNORMAL LOW (ref 150–400)
RBC: 4.27 MIL/uL (ref 4.22–5.81)
RDW: 14.1 % (ref 11.5–15.5)
WBC: 5.9 10*3/uL (ref 4.0–10.5)

## 2016-09-14 LAB — TYPE AND SCREEN
ABO/RH(D): A POS
Antibody Screen: NEGATIVE

## 2016-09-14 LAB — PROTIME-INR
INR: 1.03
Prothrombin Time: 13.5 seconds (ref 11.4–15.2)

## 2016-09-14 LAB — SURGICAL PCR SCREEN
MRSA, PCR: NEGATIVE
Staphylococcus aureus: NEGATIVE

## 2016-09-14 LAB — GLUCOSE, CAPILLARY: Glucose-Capillary: 105 mg/dL — ABNORMAL HIGH (ref 65–99)

## 2016-09-14 NOTE — Progress Notes (Signed)
PCP: Dr. Ulanda Edison @ [facility] Cardiologist: Dr. Donetta Potts and Dr. Debara Pickett  Fasting sugars 119  Pt. Not informed when to stop aspirin, he will call Dr. Damita Dunnings office for clarification.

## 2016-09-15 LAB — HEMOGLOBIN A1C
Hgb A1c MFr Bld: 5.7 % — ABNORMAL HIGH (ref 4.8–5.6)
MEAN PLASMA GLUCOSE: 117 mg/dL

## 2016-09-16 DIAGNOSIS — M1711 Unilateral primary osteoarthritis, right knee: Secondary | ICD-10-CM | POA: Diagnosis present

## 2016-09-16 NOTE — H&P (Signed)
TOTAL KNEE ADMISSION H&P  Patient is being admitted for right total knee arthroplasty.  Subjective:  Chief Complaint:right knee pain.  HPI: Jesus Oconnor, 83 y.o. male, has a history of pain and functional disability in the right knee due to arthritis and has failed non-surgical conservative treatments for greater than 12 weeks to includeNSAID's and/or analgesics, corticosteriod injections, viscosupplementation injections, flexibility and strengthening excercises, use of assistive devices, weight reduction as appropriate and activity modification.  Onset of symptoms was gradual, starting several years ago with gradually worsening course since that time. The patient noted no past surgery on the right knee(s).  Patient currently rates pain in the right knee(s) at 10 out of 10 with activity. Patient has night pain, worsening of pain with activity and weight bearing, pain that interferes with activities of daily living, pain with passive range of motion, crepitus and joint swelling.  Patient has evidence of subchondral sclerosis, periarticular osteophytes and joint space narrowing by imaging studies.   There is no active infection.  Patient Active Problem List   Diagnosis Date Noted  . S/P CABG x 5 11/27/2015  . Chronic systolic CHF (congestive heart failure) ([facility]) 09/09/2015  . CAD in native artery   . Other fatigue 08/19/2015  . Cardiomyopathy, ischemic 08/19/2015  . Essential hypertension 08/19/2015  . Atrial flutter, unspecified 08/19/2015  . NSVT (nonsustained ventricular tachycardia) ([facility]) 08/05/2014  . PAT (paroxysmal atrial tachycardia) ([facility]) 06/26/2013  . Chronic diastolic heart failure ([facility]) 03/25/2013  . Pacemaker 05/23/2012  . CAD (coronary artery disease) 11/15/2010  . Dyslipidemia   . DM2 (diabetes mellitus, type 2) ([facility])   . Atrial flutter [facility])    Past Medical History:  Diagnosis Date  . Anemia    history of  . Arthritis   . Atrial flutter ([facility])    severe bradycardia   . CHF (congestive heart failure) ([facility] [place])   . Constipation   . COPD (chronic obstructive pulmonary disease) ([facility])   . Coronary artery disease   . Cough   . Diarrhea   . Diastolic dysfunction    Echo 06/07/3534-RWERX I diastolic dysfunction,EF 54-00%  . DM2 (diabetes mellitus, type 2) ([place] [facility] [place])   . Dyslipidemia   . History of kidney stones   . HTN (hypertension)   . Hyperlipidemia   . Leg swelling   . Mobitz type 1 second degree atrioventricular block   . Pacemaker 05/22/2012   Dr  Croitoru    Medtronic Adapta  . 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 SVG to firts & second diagonals,SVG to forth posterolateral branch to RCA  . Sore throat   . Wheezing     Past Surgical History:  Procedure Laterality Date  . CABG x5  09/13/2010   Dr Farrel Conners to LAD,SVG to obtuse marginal,seq. SVG to first & second diagonals,SVG to fourth posterolateral branch RCA  . CARDIAC CATHETERIZATION  09/10/2010   severe LAD,D1,D2 & ostial D2  . CARDIAC CATHETERIZATION N/A 08/28/2015   Procedure: Right/Left Heart Cath and Coronary/Graft Angiography;  Surgeon: Troy Sine, MD;  Location: [facility] CV LAB;  Service: Cardiovascular;  Laterality: N/A;  . CHOLECYSTECTOMY  october 2012  . CORONARY ARTERY BYPASS GRAFT    . INSERT / REPLACE / REMOVE PACEMAKER  05/22/2012    dual chamber  . PERMANENT PACEMAKER INSERTION N/A 05/22/2012   Procedure: PERMANENT PACEMAKER INSERTION;  Surgeon: Sanda Klein, MD;  Location: [facility] CATH LAB;  Service: Cardiovascular;  Laterality: N/A;  . repair of  left indirect inguinal hernia with mesh.    . repair of umbilical hernia.      No prescriptions prior to admission.   No Known Allergies  Social History  Substance Use Topics  . Smoking status: Former Smoker    Years: 40.00    Types: Cigarettes    Quit date: 08/06/2010  . Smokeless tobacco: Never Used  . Alcohol use Yes     Comment: 5-6 beers per week    Family History  Problem Relation Age of Onset  .  Heart disease Father   . Heart disease Brother        s/p cabg  . Heart disease Maternal Uncle      Review of Systems  Constitutional: Negative.   HENT: Negative.   Eyes: Negative.   Respiratory: Negative.   Cardiovascular: Positive for leg swelling.       HTN  Gastrointestinal: Negative.   Genitourinary: Negative.   Musculoskeletal: Positive for joint pain.  Skin: Negative.   Neurological: Negative.   Endo/Heme/Allergies: Negative.   Psychiatric/Behavioral: Negative.     Objective:  Physical Exam  Constitutional: He is oriented to person, place, and time. He appears well-developed and well-nourished.  HENT:  Head: Normocephalic and atraumatic.  Eyes: Pupils are equal, round, and reactive to light.  Neck: Normal range of motion. Neck supple.  Cardiovascular: Intact distal pulses.   Respiratory: Effort normal.  Musculoskeletal: He exhibits tenderness.  The right knee has a 10 flexion contracture, 2+ effusion tender along the medial joint line.  Obvious varus deformity.  Skin is intact.  Grossly neurovascular intact distally.  The right knee has a valgus deformity of 10, tender along the lateral joint line.  Range of motion on the right side is 5/120 on the left 10/120.    Neurological: He is alert and oriented to person, place, and time.  Skin: Skin is warm and dry.  Psychiatric: He has a normal mood and affect. His behavior is normal. Judgment and thought content normal.    Vital signs in last 24 hours:    Labs:   Estimated body mass index is 29.22 kg/m as calculated from the following:   Height as of 09/14/16: 5\' 11"  (1.803 m).   Weight as of 09/14/16: 95 kg (209 lb 8 oz).   Imaging Review Plain radiographs demonstrate AP, [place]berg, lateral and sunrise x-rays show bone-on-bone medial compartment arthritis to the right knee lateral compartment arthritis to the left knee large peripheral osteophytes, large patellar osteophytes.  Assessment/Plan:  End stage  arthritis, right knee   The patient history, physical examination, clinical judgment of the provider and imaging studies are consistent with end stage degenerative joint disease of the right knee(s) and total knee arthroplasty is deemed medically necessary. The treatment options including medical management, injection therapy arthroscopy and arthroplasty were discussed at length. The risks and benefits of total knee arthroplasty were presented and reviewed. The risks due to aseptic loosening, infection, stiffness, patella tracking problems, thromboembolic complications and other imponderables were discussed. The patient acknowledged the explanation, agreed to proceed with the plan and consent was signed. Patient is being admitted for inpatient treatment for surgery, pain control, PT, OT, prophylactic antibiotics, VTE prophylaxis, progressive ambulation and ADL's and discharge planning. The patient is planning to be discharged to skilled nursing facility

## 2016-09-19 NOTE — Progress Notes (Signed)
Anesthesia Chart Review:  Patient is an 83 year old male scheduled for R total knee arthroplasty on 09/26/2016 with Frederik Pear, M.D.  - PCP is Yaakov Guthrie, MD - Cardiologist is Lyman Bishop, MD who cleared pt for surgery  - EP cardiologist is Sanda Klein, MD who cleared pt for surgery at last office visit 07/13/16  PMH includes: CAD (S/P CABG 5 09/13/10), CHF, atrial flutter, Mobitz type I 2nd-degree AV block, pacemaker (Medtronic implanted 05/22/12), HTN, DM, hyperlipidemia, anemia. Former smoker. BMI 29.  Medications include: Amlodipine, ASA 81 mg, Lasix, lisinopril, metformin, metoprolol, potassium, pravastatin  Preoperative labs reviewed. HbA1c 5.7, glucose 127  CXR 09/14/16: No active cardiopulmonary disease.  EKG 06/03/16: AV dual paced rhythm with prolonged AV conduction.  Cardiac cath 08/28/15: - Mild global LV dysfunction with an ejection fraction of 45-50%.  There is significant mitral annular calcification and trace mitral regurgitation. - Mild pulmonary hypertension. - Significant native CAD with 20% mid left main narrowing; diffuse 60-70% proximal and 70% mid LAD stenoses; total occlusion of the proximal circumflex coronary artery with faint antegrade collaterals; and diffusely irregular RCA with mild calcification without apparent high-grade stenosis. - Patent LIMA graft supplying the mid LAD. - Patent sequential vein graft supplying the diagonal 1 and diagonal 2 vessel with mild 30% narrowing in the region of a valve in the proximal portion of the graft. - Patent vein graft supplying the distal circumflex marginal vessel with smooth 40% narrowing proximally in the region of a valve. - Patent vein graft supplying the distal posterolateral branch of the RCA.  Echo 07/21/15:  - Left ventricle: The cavity size was mildly dilated. Wall thickness was increased in a pattern of mild LVH. Systolic function was moderately to severely reduced. The estimated ejection fraction was in the  range of 30% to 35%. Diffuse hypokinesis. Features are consistent with a pseudonormal left ventricular filling pattern, with concomitant abnormal relaxation and increased filling pressure (grade 2 diastolic dysfunction). Doppler parameters are consistent with high ventricular filling pressure. - Ascending aorta: The ascending aorta was mildly dilated. - Mitral valve: Calcified annulus. There was mild regurgitation. - Left atrium: The atrium was moderately to severely dilated. - Right ventricle: The cavity size was mildly dilated. - Right atrium: The atrium was mildly dilated. - Pulmonary arteries: Systolic pressure was mildly increased. PA peak pressure: 35 mm Hg (S). - Impressions: Moderate to severe global reduction in LV function; grade 2 diastolic dysfunction with elevated LV filling pressure; mild LVH and LVE; biatrial enlargement; mild RVE; mild MR; trace TR with mildly elevated pulmonary pressure.  Perioperative prescription for pacemaker notes procedure should not interfere with device function, no device programming or magnet placement needed.  If no changes, I anticipate pt can proceed with surgery as scheduled.   Willeen Cass, FNP-BC [facility] Short Stay Surgical [facility]/Anesthesiology Phone: [phone] 09/19/2016 12:28 PM

## 2016-09-23 MED ORDER — BUPIVACAINE LIPOSOME 1.3 % IJ SUSP
20.0000 mL | INTRAMUSCULAR | Status: AC
  Administered 2016-09-26: 20 mL
  Filled 2016-09-23: qty 20

## 2016-09-23 MED ORDER — TRANEXAMIC ACID 1000 MG/10ML IV SOLN
2000.0000 mg | INTRAVENOUS | Status: AC
  Administered 2016-09-26: 2000 mg via TOPICAL
  Filled 2016-09-23: qty 20

## 2016-09-23 MED ORDER — CEFAZOLIN SODIUM-DEXTROSE 2-4 GM/100ML-% IV SOLN
2.0000 g | INTRAVENOUS | Status: AC
Start: 2016-09-26 — End: 2016-09-26
  Administered 2016-09-26: 2 g via INTRAVENOUS
  Filled 2016-09-23: qty 100

## 2016-09-23 MED ORDER — DEXTROSE-NACL 5-0.45 % IV SOLN: INTRAVENOUS | Status: DC

## 2016-09-25 NOTE — Anesthesia Preprocedure Evaluation (Addendum)
Anesthesia Evaluation  Patient identified by MRN, date of birth, ID band Patient awake    Reviewed: Allergy & Precautions, H&P , NPO status , Patient's Chart, lab work & pertinent test results  Airway Mallampati: II  TM Distance: >3 FB Neck ROM: Full    Dental no notable dental hx. (+) Edentulous Upper, Poor Dentition, Dental Advisory Given   Pulmonary COPD, former smoker,    Pulmonary exam normal breath sounds clear to auscultation       Cardiovascular Exercise Tolerance: Good hypertension, Pt. on medications and Pt. on home beta blockers + CAD, + CABG and +CHF  + dysrhythmias Atrial Fibrillation + pacemaker  Rhythm:Regular Rate:Normal     Neuro/Psych negative neurological ROS  negative psych ROS   GI/Hepatic negative GI ROS, Neg liver ROS,   Endo/Other  diabetes, Type 2, Oral Hypoglycemic Agents  Renal/GU negative Renal ROS  negative genitourinary   Musculoskeletal  (+) Arthritis , Osteoarthritis,    Abdominal   Peds  Hematology negative hematology ROS (+) anemia ,   Anesthesia Other Findings   Reproductive/Obstetrics negative OB ROS                            Anesthesia Physical Anesthesia Plan  ASA: III  Anesthesia Plan: Spinal   Post-op Pain Management:  Regional for Post-op pain   Induction: Intravenous  PONV Risk Score and Plan: 2 and Ondansetron, Dexamethasone and Propofol  Airway Management Planned: Simple Face Mask  Additional Equipment:   Intra-op Plan:   Post-operative Plan:   Informed Consent: I have reviewed the patients History and Physical, chart, labs and discussed the procedure including the risks, benefits and alternatives for the proposed anesthesia with the patient or authorized representative who has indicated his/her understanding and acceptance.   Dental advisory given  Plan Discussed with: CRNA  Anesthesia Plan Comments:        Anesthesia  Quick Evaluation

## 2016-09-26 ENCOUNTER — Encounter (HOSPITAL_COMMUNITY)
Admission: RE | Disposition: A | Discharge status: Skilled Nursing Facility | Payer: Self-pay | Source: Ambulatory Visit | Attending: Orthopedic Surgery

## 2016-09-26 ENCOUNTER — Encounter (HOSPITAL_COMMUNITY): Payer: Self-pay | Admitting: Urology

## 2016-09-26 ENCOUNTER — Inpatient Hospital Stay (HOSPITAL_COMMUNITY)
Admission: RE | Admit: 2016-09-26 | Discharge: 2016-09-28 | DRG: 470 | Disposition: A | Discharge status: Skilled Nursing Facility | Payer: Medicare Other | Source: Ambulatory Visit | Attending: Orthopedic Surgery | Admitting: Orthopedic Surgery

## 2016-09-26 ENCOUNTER — Inpatient Hospital Stay (HOSPITAL_COMMUNITY): Payer: Medicare Other | Admitting: Anesthesiology

## 2016-09-26 ENCOUNTER — Inpatient Hospital Stay (HOSPITAL_COMMUNITY): Payer: Medicare Other | Admitting: Emergency Medicine

## 2016-09-26 DIAGNOSIS — D62 Acute posthemorrhagic anemia: Secondary | ICD-10-CM | POA: Diagnosis not present

## 2016-09-26 DIAGNOSIS — Z8249 Family history of ischemic heart disease and other diseases of the circulatory system: Secondary | ICD-10-CM

## 2016-09-26 DIAGNOSIS — I255 Ischemic cardiomyopathy: Secondary | ICD-10-CM | POA: Diagnosis present

## 2016-09-26 DIAGNOSIS — E119 Type 2 diabetes mellitus without complications: Secondary | ICD-10-CM | POA: Diagnosis present

## 2016-09-26 DIAGNOSIS — K5900 Constipation, unspecified: Secondary | ICD-10-CM | POA: Diagnosis present

## 2016-09-26 DIAGNOSIS — I2510 Atherosclerotic heart disease of native coronary artery without angina pectoris: Secondary | ICD-10-CM | POA: Diagnosis present

## 2016-09-26 DIAGNOSIS — I4892 Unspecified atrial flutter: Secondary | ICD-10-CM | POA: Diagnosis present

## 2016-09-26 DIAGNOSIS — E785 Hyperlipidemia, unspecified: Secondary | ICD-10-CM | POA: Diagnosis present

## 2016-09-26 DIAGNOSIS — M1711 Unilateral primary osteoarthritis, right knee: Principal | ICD-10-CM | POA: Diagnosis present

## 2016-09-26 DIAGNOSIS — Z950 Presence of cardiac pacemaker: Secondary | ICD-10-CM | POA: Diagnosis not present

## 2016-09-26 DIAGNOSIS — Z87891 Personal history of nicotine dependence: Secondary | ICD-10-CM | POA: Diagnosis not present

## 2016-09-26 DIAGNOSIS — I110 Hypertensive heart disease with heart failure: Secondary | ICD-10-CM | POA: Diagnosis present

## 2016-09-26 DIAGNOSIS — Z951 Presence of aortocoronary bypass graft: Secondary | ICD-10-CM

## 2016-09-26 DIAGNOSIS — I5042 Chronic combined systolic (congestive) and diastolic (congestive) heart failure: Secondary | ICD-10-CM | POA: Diagnosis present

## 2016-09-26 DIAGNOSIS — J449 Chronic obstructive pulmonary disease, unspecified: Secondary | ICD-10-CM | POA: Diagnosis present

## 2016-09-26 HISTORY — PX: TOTAL KNEE ARTHROPLASTY: SHX125

## 2016-09-26 LAB — GLUCOSE, CAPILLARY
GLUCOSE-CAPILLARY: 341 mg/dL — AB (ref 65–99)
GLUCOSE-CAPILLARY: 162 mg/dL — AB (ref 65–99)
Glucose-Capillary: 113 mg/dL — ABNORMAL HIGH (ref 65–99)
Glucose-Capillary: 122 mg/dL — ABNORMAL HIGH (ref 65–99)
Glucose-Capillary: 382 mg/dL — ABNORMAL HIGH (ref 65–99)

## 2016-09-26 SURGERY — ARTHROPLASTY, KNEE, TOTAL
Anesthesia: Spinal | Laterality: Right

## 2016-09-26 MED ORDER — CEFUROXIME SODIUM 750 MG IJ SOLR
INTRAMUSCULAR | Status: AC
  Filled 2016-09-26: qty 1500

## 2016-09-26 MED ORDER — POTASSIUM CHLORIDE CRYS ER 20 MEQ PO TBCR
20.0000 meq | EXTENDED_RELEASE_TABLET | Freq: Every day | ORAL | Status: DC
  Administered 2016-09-27 – 2016-09-28 (×2): 20 meq via ORAL
  Filled 2016-09-26 (×2): qty 1

## 2016-09-26 MED ORDER — FENTANYL CITRATE (PF) 100 MCG/2ML IJ SOLN
INTRAMUSCULAR | Status: DC | PRN
  Administered 2016-09-26: 50 ug via INTRAVENOUS

## 2016-09-26 MED ORDER — CHLORHEXIDINE GLUCONATE 4 % EX LIQD: 60.0000 mL | Freq: Once | CUTANEOUS | Status: DC

## 2016-09-26 MED ORDER — BISACODYL 5 MG PO TBEC
5.0000 mg | DELAYED_RELEASE_TABLET | Freq: Every day | ORAL | Status: DC | PRN
  Administered 2016-09-27: 5 mg via ORAL
  Filled 2016-09-26: qty 1

## 2016-09-26 MED ORDER — ROPIVACAINE HCL 5 MG/ML IJ SOLN
INTRAMUSCULAR | Status: DC | PRN
  Administered 2016-09-26: 30 mL via PERINEURAL

## 2016-09-26 MED ORDER — MIDAZOLAM HCL 5 MG/5ML IJ SOLN
INTRAMUSCULAR | Status: DC | PRN
  Administered 2016-09-26: 0.5 mg via INTRAVENOUS
  Administered 2016-09-26: 1 mg via INTRAVENOUS
  Administered 2016-09-26: 0.5 mg via INTRAVENOUS

## 2016-09-26 MED ORDER — DEXAMETHASONE SODIUM PHOSPHATE 10 MG/ML IJ SOLN
INTRAMUSCULAR | Status: DC | PRN
  Administered 2016-09-26: 10 mg via INTRAVENOUS

## 2016-09-26 MED ORDER — FUROSEMIDE 40 MG PO TABS
40.0000 mg | ORAL_TABLET | Freq: Every day | ORAL | Status: DC
  Administered 2016-09-27 – 2016-09-28 (×2): 40 mg via ORAL
  Filled 2016-09-26 (×2): qty 1

## 2016-09-26 MED ORDER — IBUPROFEN 400 MG PO TABS
400.0000 mg | ORAL_TABLET | Freq: Four times a day (QID) | ORAL | Status: DC | PRN
  Filled 2016-09-26: qty 1

## 2016-09-26 MED ORDER — SODIUM CHLORIDE 0.9 % IJ SOLN
INTRAMUSCULAR | Status: DC | PRN
  Administered 2016-09-26: 50 mL via INTRAVENOUS

## 2016-09-26 MED ORDER — METFORMIN HCL 500 MG PO TABS
1000.0000 mg | ORAL_TABLET | Freq: Two times a day (BID) | ORAL | Status: DC
  Administered 2016-09-26 – 2016-09-28 (×4): 1000 mg via ORAL
  Filled 2016-09-26 (×4): qty 2

## 2016-09-26 MED ORDER — PROPOFOL 10 MG/ML IV BOLUS
INTRAVENOUS | Status: AC
Start: 2016-09-26 — End: ?
  Filled 2016-09-26: qty 20

## 2016-09-26 MED ORDER — HYDROMORPHONE HCL 1 MG/ML IJ SOLN: 0.5000 mg | INTRAMUSCULAR | Status: DC | PRN

## 2016-09-26 MED ORDER — FLEET ENEMA 7-19 GM/118ML RE ENEM: 1.0000 | ENEMA | Freq: Once | RECTAL | Status: DC | PRN

## 2016-09-26 MED ORDER — INSULIN ASPART 100 UNIT/ML ~~LOC~~ SOLN
0.0000 [IU] | Freq: Three times a day (TID) | SUBCUTANEOUS | Status: DC
Start: 2016-09-26 — End: 2016-09-28
  Administered 2016-09-26: 2 [IU] via SUBCUTANEOUS
  Administered 2016-09-26: 11 [IU] via SUBCUTANEOUS
  Administered 2016-09-27 (×3): 2 [IU] via SUBCUTANEOUS
  Administered 2016-09-28 (×2): 3 [IU] via SUBCUTANEOUS

## 2016-09-26 MED ORDER — FENTANYL CITRATE (PF) 250 MCG/5ML IJ SOLN
INTRAMUSCULAR | Status: AC
  Filled 2016-09-26: qty 5

## 2016-09-26 MED ORDER — TRANEXAMIC ACID 1000 MG/10ML IV SOLN
1000.0000 mg | INTRAVENOUS | Status: AC
  Administered 2016-09-26: 1000 mg via INTRAVENOUS
  Filled 2016-09-26: qty 10

## 2016-09-26 MED ORDER — CEFUROXIME SODIUM 1.5 G IV SOLR
INTRAVENOUS | Status: DC | PRN
  Administered 2016-09-26: 1.5 g via INTRAVENOUS

## 2016-09-26 MED ORDER — METOCLOPRAMIDE HCL 5 MG/ML IJ SOLN: 5.0000 mg | Freq: Three times a day (TID) | INTRAMUSCULAR | Status: DC | PRN

## 2016-09-26 MED ORDER — TIZANIDINE HCL 2 MG PO TABS: 2.0000 mg | ORAL_TABLET | Freq: Four times a day (QID) | ORAL | 0 refills | Status: DC | PRN

## 2016-09-26 MED ORDER — TRAZODONE HCL 50 MG PO TABS: 50.0000 mg | ORAL_TABLET | Freq: Every evening | ORAL | Status: DC | PRN

## 2016-09-26 MED ORDER — HYDROMORPHONE HCL 1 MG/ML IJ SOLN: 0.2500 mg | INTRAMUSCULAR | Status: DC | PRN

## 2016-09-26 MED ORDER — ACETAMINOPHEN 650 MG RE SUPP: 650.0000 mg | Freq: Four times a day (QID) | RECTAL | Status: DC | PRN

## 2016-09-26 MED ORDER — DIPHENHYDRAMINE HCL 12.5 MG/5ML PO ELIX
12.5000 mg | ORAL_SOLUTION | ORAL | Status: DC | PRN
  Administered 2016-09-26: 25 mg via ORAL
  Filled 2016-09-26: qty 10

## 2016-09-26 MED ORDER — AMLODIPINE BESYLATE 10 MG PO TABS
10.0000 mg | ORAL_TABLET | Freq: Every day | ORAL | Status: DC
  Administered 2016-09-27 – 2016-09-28 (×2): 10 mg via ORAL
  Filled 2016-09-26 (×2): qty 1

## 2016-09-26 MED ORDER — METHOCARBAMOL 1000 MG/10ML IJ SOLN
500.0000 mg | Freq: Four times a day (QID) | INTRAVENOUS | Status: DC | PRN
  Filled 2016-09-26: qty 5

## 2016-09-26 MED ORDER — ASPIRIN EC 325 MG PO TBEC
325.0000 mg | DELAYED_RELEASE_TABLET | Freq: Every day | ORAL | Status: DC
  Administered 2016-09-27 – 2016-09-28 (×2): 325 mg via ORAL
  Filled 2016-09-26 (×2): qty 1

## 2016-09-26 MED ORDER — METHOCARBAMOL 500 MG PO TABS
ORAL_TABLET | ORAL | Status: AC
  Administered 2016-09-26: 500 mg via ORAL
  Filled 2016-09-26: qty 1

## 2016-09-26 MED ORDER — SODIUM CHLORIDE 0.9 % IR SOLN
Status: DC | PRN
  Administered 2016-09-26: 3000 mL

## 2016-09-26 MED ORDER — MENTHOL 3 MG MT LOZG: 1.0000 | LOZENGE | OROMUCOSAL | Status: DC | PRN

## 2016-09-26 MED ORDER — LISINOPRIL 20 MG PO TABS
20.0000 mg | ORAL_TABLET | Freq: Two times a day (BID) | ORAL | Status: DC
  Administered 2016-09-26 – 2016-09-28 (×4): 20 mg via ORAL
  Filled 2016-09-26 (×4): qty 1

## 2016-09-26 MED ORDER — OXYCODONE HCL 5 MG PO TABS
5.0000 mg | ORAL_TABLET | ORAL | Status: DC | PRN
  Administered 2016-09-26 (×3): 10 mg via ORAL
  Administered 2016-09-27 (×2): 5 mg via ORAL
  Administered 2016-09-27: 10 mg via ORAL
  Administered 2016-09-27 (×2): 5 mg via ORAL
  Administered 2016-09-28: 10 mg via ORAL
  Filled 2016-09-26 (×2): qty 1
  Filled 2016-09-26 (×2): qty 2
  Filled 2016-09-26: qty 1
  Filled 2016-09-26: qty 2
  Filled 2016-09-26: qty 1
  Filled 2016-09-26: qty 2

## 2016-09-26 MED ORDER — ACETAMINOPHEN 325 MG PO TABS
650.0000 mg | ORAL_TABLET | Freq: Four times a day (QID) | ORAL | Status: DC | PRN
Start: 2016-09-26 — End: 2016-09-28
  Administered 2016-09-26 – 2016-09-28 (×6): 650 mg via ORAL
  Filled 2016-09-26 (×5): qty 2

## 2016-09-26 MED ORDER — TRANEXAMIC ACID 1000 MG/10ML IV SOLN
1000.0000 mg | Freq: Once | INTRAVENOUS | Status: AC
  Administered 2016-09-26: 1000 mg via INTRAVENOUS
  Filled 2016-09-26: qty 10

## 2016-09-26 MED ORDER — ONDANSETRON HCL 4 MG/2ML IJ SOLN
INTRAMUSCULAR | Status: DC | PRN
  Administered 2016-09-26: 4 mg via INTRAVENOUS

## 2016-09-26 MED ORDER — OXYCODONE-ACETAMINOPHEN 5-325 MG PO TABS: 1.0000 | ORAL_TABLET | ORAL | 0 refills | Status: DC | PRN

## 2016-09-26 MED ORDER — METHOCARBAMOL 500 MG PO TABS
500.0000 mg | ORAL_TABLET | Freq: Four times a day (QID) | ORAL | Status: DC | PRN
Start: 2016-09-26 — End: 2016-09-28
  Administered 2016-09-26 – 2016-09-28 (×5): 500 mg via ORAL
  Filled 2016-09-26 (×4): qty 1

## 2016-09-26 MED ORDER — ALUM & MAG HYDROXIDE-SIMETH 200-200-20 MG/5ML PO SUSP: 30.0000 mL | ORAL | Status: DC | PRN

## 2016-09-26 MED ORDER — KCL IN DEXTROSE-NACL 20-5-0.45 MEQ/L-%-% IV SOLN: INTRAVENOUS | Status: DC

## 2016-09-26 MED ORDER — BUPIVACAINE-EPINEPHRINE (PF) 0.25% -1:200000 IJ SOLN
INTRAMUSCULAR | Status: DC | PRN
  Administered 2016-09-26: 50 mL via PERINEURAL

## 2016-09-26 MED ORDER — MIDAZOLAM HCL 2 MG/2ML IJ SOLN
INTRAMUSCULAR | Status: AC
  Filled 2016-09-26: qty 2

## 2016-09-26 MED ORDER — PROPOFOL 1000 MG/100ML IV EMUL
INTRAVENOUS | Status: AC
  Filled 2016-09-26: qty 100

## 2016-09-26 MED ORDER — PRAVASTATIN SODIUM 20 MG PO TABS
20.0000 mg | ORAL_TABLET | Freq: Every day | ORAL | Status: DC
  Administered 2016-09-27 – 2016-09-28 (×2): 20 mg via ORAL
  Filled 2016-09-26 (×2): qty 1

## 2016-09-26 MED ORDER — METOCLOPRAMIDE HCL 5 MG PO TABS: 5.0000 mg | ORAL_TABLET | Freq: Three times a day (TID) | ORAL | Status: DC | PRN

## 2016-09-26 MED ORDER — ONDANSETRON HCL 4 MG PO TABS: 4.0000 mg | ORAL_TABLET | Freq: Four times a day (QID) | ORAL | Status: DC | PRN

## 2016-09-26 MED ORDER — PHENYLEPHRINE HCL 10 MG/ML IJ SOLN
INTRAVENOUS | Status: DC | PRN
  Administered 2016-09-26: 25 ug/min via INTRAVENOUS

## 2016-09-26 MED ORDER — BUPIVACAINE IN DEXTROSE 0.75-8.25 % IT SOLN
INTRATHECAL | Status: DC | PRN
  Administered 2016-09-26: 15 mg via INTRATHECAL

## 2016-09-26 MED ORDER — DOCUSATE SODIUM 100 MG PO CAPS
100.0000 mg | ORAL_CAPSULE | Freq: Two times a day (BID) | ORAL | Status: DC
  Administered 2016-09-26 – 2016-09-28 (×4): 100 mg via ORAL
  Filled 2016-09-26 (×4): qty 1

## 2016-09-26 MED ORDER — PROPOFOL 500 MG/50ML IV EMUL
INTRAVENOUS | Status: DC | PRN
  Administered 2016-09-26: 150 ug/kg/min via INTRAVENOUS

## 2016-09-26 MED ORDER — BUPIVACAINE-EPINEPHRINE 0.25% -1:200000 IJ SOLN
INTRAMUSCULAR | Status: AC
  Filled 2016-09-26: qty 1

## 2016-09-26 MED ORDER — OXYCODONE HCL 5 MG PO TABS
ORAL_TABLET | ORAL | Status: AC
  Administered 2016-09-26: 10 mg via ORAL
  Filled 2016-09-26: qty 2

## 2016-09-26 MED ORDER — CARVEDILOL 12.5 MG PO TABS
12.5000 mg | ORAL_TABLET | Freq: Two times a day (BID) | ORAL | Status: DC
  Administered 2016-09-26 – 2016-09-28 (×4): 12.5 mg via ORAL
  Filled 2016-09-26 (×4): qty 1

## 2016-09-26 MED ORDER — SENNOSIDES-DOCUSATE SODIUM 8.6-50 MG PO TABS
1.0000 | ORAL_TABLET | Freq: Every evening | ORAL | Status: DC | PRN
  Administered 2016-09-27: 1 via ORAL
  Filled 2016-09-26: qty 1

## 2016-09-26 MED ORDER — PHENOL 1.4 % MT LIQD: 1.0000 | OROMUCOSAL | Status: DC | PRN

## 2016-09-26 MED ORDER — ASPIRIN EC 325 MG PO TBEC: 325.0000 mg | DELAYED_RELEASE_TABLET | Freq: Two times a day (BID) | ORAL | 0 refills | Status: DC

## 2016-09-26 MED ORDER — ONDANSETRON HCL 4 MG/2ML IJ SOLN: 4.0000 mg | Freq: Four times a day (QID) | INTRAMUSCULAR | Status: DC | PRN

## 2016-09-26 MED ORDER — LACTATED RINGERS IV SOLN
INTRAVENOUS | Status: DC | PRN
  Administered 2016-09-26: 07:00:00 via INTRAVENOUS

## 2016-09-26 MED ORDER — ACETAMINOPHEN 325 MG PO TABS
ORAL_TABLET | ORAL | Status: AC
  Administered 2016-09-26: 650 mg via ORAL
  Filled 2016-09-26: qty 2

## 2016-09-26 SURGICAL SUPPLY — 55 items
BANDAGE ESMARK 6X9 LF (GAUZE/BANDAGES/DRESSINGS) ×1 IMPLANT
BLADE SAG 18X100X1.27 (BLADE) ×3 IMPLANT
BLADE SAGITTAL 13X1.27X60 (BLADE) ×2 IMPLANT
BLADE SAGITTAL 13X1.27X60MM (BLADE) ×1
BLADE SAW SGTL 13X75X1.27 (BLADE) IMPLANT
BNDG CMPR 9X6 STRL LF SNTH (GAUZE/BANDAGES/DRESSINGS) ×1
BNDG CMPR MED 10X6 ELC LF (GAUZE/BANDAGES/DRESSINGS) ×1
BNDG ELASTIC 6X10 VLCR STRL LF (GAUZE/BANDAGES/DRESSINGS) ×3 IMPLANT
BNDG ESMARK 6X9 LF (GAUZE/BANDAGES/DRESSINGS) ×3
BOWL SMART MIX CTS (DISPOSABLE) ×3 IMPLANT
CAPT KNEE TOTAL 3 ATTUNE ×3 IMPLANT
CEMENT HV SMART SET (Cement) ×6 IMPLANT
COVER SURGICAL LIGHT HANDLE (MISCELLANEOUS) ×3 IMPLANT
CUFF TOURNIQUET SINGLE 34IN LL (TOURNIQUET CUFF) ×3 IMPLANT
DRAPE EXTREMITY T 121X128X90 (DRAPE) ×3 IMPLANT
DRAPE U-SHAPE 47X51 STRL (DRAPES) ×3 IMPLANT
DRSG AQUACEL AG ADV 3.5X10 (GAUZE/BANDAGES/DRESSINGS) ×3 IMPLANT
DURAPREP 26ML APPLICATOR (WOUND CARE) ×3 IMPLANT
ELECT REM PT RETURN 9FT ADLT (ELECTROSURGICAL) ×3
ELECTRODE REM PT RTRN 9FT ADLT (ELECTROSURGICAL) ×1 IMPLANT
GLOVE BIO SURGEON STRL SZ7.5 (GLOVE) ×3 IMPLANT
GLOVE BIO SURGEON STRL SZ8.5 (GLOVE) ×3 IMPLANT
GLOVE BIOGEL PI IND STRL 6 (GLOVE) ×1 IMPLANT
GLOVE BIOGEL PI IND STRL 8 (GLOVE) ×1 IMPLANT
GLOVE BIOGEL PI IND STRL 9 (GLOVE) ×1 IMPLANT
GLOVE BIOGEL PI INDICATOR 6 (GLOVE) ×2
GLOVE BIOGEL PI INDICATOR 8 (GLOVE) ×2
GLOVE BIOGEL PI INDICATOR 9 (GLOVE) ×2
GLOVE SURG SS PI 6.0 STRL IVOR (GLOVE) ×3 IMPLANT
GOWN STRL REUS W/ TWL LRG LVL3 (GOWN DISPOSABLE) ×1 IMPLANT
GOWN STRL REUS W/ TWL XL LVL3 (GOWN DISPOSABLE) ×2 IMPLANT
GOWN STRL REUS W/TWL LRG LVL3 (GOWN DISPOSABLE) ×2
GOWN STRL REUS W/TWL XL LVL3 (GOWN DISPOSABLE) ×6
HANDPIECE INTERPULSE COAX TIP (DISPOSABLE) ×2
HOOD PEEL AWAY FACE SHEILD DIS (HOOD) ×6 IMPLANT
KIT BASIN OR (CUSTOM PROCEDURE TRAY) ×3 IMPLANT
KIT ROOM TURNOVER OR (KITS) ×3 IMPLANT
MANIFOLD NEPTUNE II (INSTRUMENTS) ×3 IMPLANT
NEEDLE 22X1 1/2 (OR ONLY) (NEEDLE) ×6 IMPLANT
NS IRRIG 1000ML POUR BTL (IV SOLUTION) ×3 IMPLANT
PACK TOTAL JOINT (CUSTOM PROCEDURE TRAY) ×3 IMPLANT
PAD ARMBOARD 7.5X6 YLW CONV (MISCELLANEOUS) ×6 IMPLANT
SET HNDPC FAN SPRY TIP SCT (DISPOSABLE) ×1 IMPLANT
SUT VIC AB 0 CT1 27 (SUTURE) ×2
SUT VIC AB 0 CT1 27XBRD ANBCTR (SUTURE) ×1 IMPLANT
SUT VIC AB 1 CTX 36 (SUTURE) ×3
SUT VIC AB 1 CTX36XBRD ANBCTR (SUTURE) ×1 IMPLANT
SUT VIC AB 2-0 CT1 27 (SUTURE) ×2
SUT VIC AB 2-0 CT1 TAPERPNT 27 (SUTURE) ×1 IMPLANT
SUT VIC AB 3-0 CT1 27 (SUTURE) ×3
SUT VIC AB 3-0 CT1 TAPERPNT 27 (SUTURE) ×1 IMPLANT
SYR CONTROL 10ML LL (SYRINGE) ×6 IMPLANT
TOWEL OR 17X24 6PK STRL BLUE (TOWEL DISPOSABLE) ×3 IMPLANT
TOWEL OR 17X26 10 PK STRL BLUE (TOWEL DISPOSABLE) ×3 IMPLANT
TRAY CATH 16FR W/PLASTIC CATH (SET/KITS/TRAYS/PACK) IMPLANT

## 2016-09-26 NOTE — Anesthesia Postprocedure Evaluation (Signed)
Anesthesia Post Note  Patient: Jesus Oconnor  Procedure(s) Performed: Procedure(s) (LRB): TOTAL KNEE ARTHROPLASTY (Right)     Patient location during evaluation: PACU Anesthesia Type: Spinal and Regional Level of consciousness: awake and alert Pain management: pain level controlled Vital Signs Assessment: post-procedure vital signs reviewed and stable Respiratory status: spontaneous breathing and respiratory function stable Cardiovascular status: blood pressure returned to baseline and stable Postop Assessment: spinal receding Anesthetic complications: no    Last Vitals:  Vitals:   09/26/16 1015 09/26/16 1030  BP: (!) 149/70   Pulse: (!) 59 63  Resp: 13 13  Temp:      Last Pain:  Vitals:   09/26/16 0935  TempSrc:   PainSc: 0-No pain            L Sensory Level: (P) S1-Sole of foot, small toes (09/26/16 1030) R Sensory Level: (P) S1-Sole of foot, small toes (09/26/16 1030)  Junell Cullifer,W. EDMOND

## 2016-09-26 NOTE — Evaluation (Signed)
Physical Therapy Evaluation Patient Details Name: Jesus Oconnor MRN: [id number] DOB: [birth year] Today's Date: 09/26/2016   History of Present Illness  Pt is an 83 y/o male admitted secondary to elective R TKA. 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   Clinical Impression  Pt is s/p surgery above with deficits below. PTA, pt was independent with functional mobility using a cane. Pt reports he used a RW for his "bad days." Upon eval, pt limited by post op pain and weakness, as well as, decreased balance. Pt requiring min to mod A for functional mobility this session. Pt reporting the plan is to go SNF at d/c. Feel this is appropriate given current mobility limitations and assist required. Will continue to follow acutely to maximize functional mobility independence and safety.     Follow Up Recommendations SNF    Equipment Recommendations  None recommended by PT    Recommendations for Other Services       Precautions / Restrictions Precautions Precautions: Knee Precaution Booklet Issued: Yes (comment) Precaution Comments: Reviewed supine ther ex with pt.  Restrictions Weight Bearing Restrictions: Yes RLE Weight Bearing: Weight bearing as tolerated      Mobility  Bed Mobility Overal bed mobility: Needs Assistance Bed Mobility: Supine to Sit     Supine to sit: Mod assist     General bed mobility comments: Mod A for RLE management and trunk elevation.    Transfers Overall transfer level: Needs assistance Equipment used: Rolling walker (2 wheeled) Transfers: Sit to/from Stand Sit to Stand: Mod assist;From elevated surface         General transfer comment: Elevated surface and mod A for lift assist and steadying upon standing. Verbal cues for safe hand placement.   Ambulation/Gait Ambulation/Gait assistance: Min assist Ambulation Distance (Feet): 10 Feet Assistive device: Rolling walker  (2 wheeled) Gait Pattern/deviations: Step-to pattern;Decreased step length - right;Decreased step length - left;Decreased weight shift to right;Antalgic Gait velocity: Decreased Gait velocity interpretation: Below normal speed for age/gender General Gait Details: Slow, antalgic gait secondary to post op pain and weakness. Verbal cues for upright posture and sequencing with use of RW.   Stairs            Wheelchair Mobility    Modified Rankin (Stroke Patients Only)       Balance Overall balance assessment: Needs assistance Sitting-balance support: No upper extremity supported;Feet supported Sitting balance-Leahy Scale: Fair     Standing balance support: Bilateral upper extremity supported;During functional activity Standing balance-Leahy Scale: Poor Standing balance comment: Reliant on RW for stability                              Pertinent Vitals/Pain Pain Assessment: 0-10 Pain Score: 5  Pain Location: R knee; Rates back pain as 8/10 Pain Descriptors / Indicators: Throbbing;Operative site guarding;Sore Pain Intervention(s): Limited activity within patient's tolerance;Monitored during session;Repositioned    Home Living Family/patient expects to be discharged to:: Skilled nursing facility                 Additional Comments: Possibly [facility]    Prior Function Level of Independence: Independent with assistive device(s)         Comments: Used cane for ambulation. For bad days, used RW.      Hand Dominance   Dominant Hand: Right    Extremity/Trunk Assessment  Upper Extremity Assessment Upper Extremity Assessment: Defer to OT evaluation    Lower Extremity Assessment Lower Extremity Assessment: RLE deficits/detail RLE Deficits / Details: dreased sensation from knee to toes, however, is able to feel some. Able to perform exercises below. Deficits consistent with post op pain and weakness.     Cervical / Trunk Assessment Cervical /  Trunk Assessment: Kyphotic  Communication   Communication: No difficulties  Cognition Arousal/Alertness: Awake/alert Behavior During Therapy: WFL for tasks assessed/performed Overall Cognitive Status: Within Functional Limits for tasks assessed                                        General Comments General comments (skin integrity, edema, etc.): Pt's daughter present throughout session.     Exercises Total Joint Exercises Ankle Circles/Pumps: AROM;Both;10 reps;Supine Quad Sets: AROM;Right;10 reps;Supine Towel Squeeze: AROM;Both;10 reps;Supine Short Arc Quad: AAROM;Right;10 reps;Supine Heel Slides: AROM;Right;10 reps;Supine (partial range) Hip ABduction/ADduction: AAROM;Right;10 reps;Supine   Assessment/Plan    PT Assessment Patient needs continued PT services  PT Problem List Decreased strength;Decreased range of motion;Decreased activity tolerance;Decreased mobility;Decreased balance;Decreased knowledge of use of DME;Decreased knowledge of precautions;Pain       PT Treatment Interventions DME instruction;Gait training;Functional mobility training;Therapeutic exercise;Therapeutic activities;Balance training;Neuromuscular re-education;Patient/family education    PT Goals (Current goals can be found in the Care Plan section)  Acute Rehab PT Goals Patient Stated Goal: to go to rehab to get stronger before going home  PT Goal Formulation: With patient Time For Goal Achievement: 10/03/16 Potential to Achieve Goals: Good    Frequency 7X/week   Barriers to discharge        Co-evaluation               AM-PAC PT "6 Clicks" Daily Activity  Outcome Measure Difficulty turning over in bed (including adjusting bedclothes, sheets and blankets)?: Total Difficulty moving from lying on back to sitting on the side of the bed? : Total Difficulty sitting down on and standing up from a chair with arms (e.g., wheelchair, bedside commode, etc,.)?: Total Help needed  moving to and from a bed to chair (including a wheelchair)?: A Little Help needed walking in hospital room?: A Little Help needed climbing 3-5 steps with a railing? : A Lot 6 Click Score: 11    End of Session Equipment Utilized During Treatment: Gait belt;Right knee immobilizer Activity Tolerance: Patient tolerated treatment well Patient left: in chair;with call bell/phone within reach;with family/visitor present Nurse Communication: Mobility status;Patient requests pain meds PT Visit Diagnosis: Other abnormalities of gait and mobility (R26.89);Pain Pain - Right/Left: Right Pain - part of body: Knee    Time: 3220-2542 PT Time Calculation (min) (ACUTE ONLY): 33 min   Charges:   PT Evaluation $PT Eval Low Complexity: 1 Procedure PT Treatments $Gait Training: 8-22 mins   PT G Codes:        Leighton Ruff, PT, DPT  Acute Rehabilitation Services  Pager: [phone]   Rudean Hitt 09/26/2016, 3:58 PM

## 2016-09-26 NOTE — Transfer of Care (Signed)
Immediate Anesthesia Transfer of Care Note  Patient: Jesus Oconnor  Procedure(s) Performed: Procedure(s): TOTAL KNEE ARTHROPLASTY (Right)  Patient Location: PACU  Anesthesia Type:MAC  Level of Consciousness: awake, oriented and patient cooperative  Airway & Oxygen Therapy: Patient Spontanous Breathing and Patient connected to nasal cannula oxygen  Post-op Assessment: Report given to RN and Post -op Vital signs reviewed and stable  Post vital signs: Reviewed and stable  Last Vitals:  Vitals:   09/26/16 0552 09/26/16 0935  BP: (!) 145/60   Pulse: 61   Resp: 18 18  Temp: 37.1 C     Last Pain:  Vitals:   09/26/16 0552  TempSrc: Oral         Complications: No apparent anesthesia complications

## 2016-09-26 NOTE — Anesthesia Procedure Notes (Signed)
Spinal  Patient location during procedure: OR Start time: 09/26/2016 7:30 AM End time: 09/26/2016 7:34 AM Staffing Anesthesiologist: Roderic Palau Performed: anesthesiologist  Preanesthetic Checklist Completed: patient identified, surgical consent, pre-op evaluation, timeout performed, IV checked, risks and benefits discussed and monitors and equipment checked Spinal Block Patient position: sitting Prep: DuraPrep Patient monitoring: cardiac monitor, continuous pulse ox and blood pressure Approach: midline Location: L3-4 Injection technique: single-shot Needle Needle type: Pencan  Needle gauge: 24 G Needle length: 9 cm Assessment Sensory level: T8 Additional Notes Functioning IV was confirmed and monitors were applied. Sterile prep and drape, including hand hygiene and sterile gloves were used. The patient was positioned and the spine was prepped. The skin was anesthetized with lidocaine.  Free flow of clear CSF was obtained prior to injecting local anesthetic into the CSF.  The spinal needle aspirated freely following injection.  The needle was carefully withdrawn.  The patient tolerated the procedure well.

## 2016-09-26 NOTE — Discharge Instructions (Signed)

## 2016-09-26 NOTE — Anesthesia Procedure Notes (Signed)
Anesthesia Regional Block: Adductor canal block   Pre-Anesthetic Checklist: ,, timeout performed, Correct Patient, Correct Site, Correct Laterality, Correct Procedure, Correct Position, site marked, Risks and benefits discussed, pre-op evaluation,  At surgeon's request and post-op pain management  Laterality: Right  Prep: Maximum Sterile Barrier Precautions used, chloraprep       Needles:  Injection technique: Single-shot  Needle Type: Echogenic Stimulator Needle     Needle Length: 9cm  Needle Gauge: 21     Additional Needles:   Procedures: ultrasound guided,,,,,,,,  Narrative:  Start time: 09/26/2016 7:00 AM End time: 09/26/2016 7:10 AM Injection made incrementally with aspirations every 5 mL. Anesthesiologist: Roderic Palau  Additional Notes: 2% Lidocaine skin wheel.

## 2016-09-26 NOTE — Interval H&P Note (Signed)
History and Physical Interval Note:  09/26/2016 7:13 AM  Jesus Oconnor  has presented today for surgery, with the diagnosis of RIGHT KNEE OSTEOARTHRITIS  The various methods of treatment have been discussed with the patient and family. After consideration of risks, benefits and other options for treatment, the patient has consented to  Procedure(s): TOTAL KNEE ARTHROPLASTY (Right) as a surgical intervention .  The patient's history has been reviewed, patient examined, no change in status, stable for surgery.  I have reviewed the patient's chart and labs.  Questions were answered to the patient's satisfaction.     Kerin Salen

## 2016-09-26 NOTE — Anesthesia Procedure Notes (Signed)
Date/Time: 09/26/2016 7:50 AM Performed by: Willeen Cass P Pre-anesthesia Checklist: Patient identified, Emergency Drugs available, Suction available and Patient being monitored Patient Re-evaluated:Patient Re-evaluated prior to induction Oxygen Delivery Method: Simple face mask Induction Type: IV induction Ventilation: Oral airway inserted - appropriate to patient size Placement Confirmation: positive ETCO2 Dental Injury: Teeth and Oropharynx as per pre-operative assessment

## 2016-09-26 NOTE — Progress Notes (Signed)
Orthopedic Tech Progress Note Patient Details:  Jesus Oconnor [birth year] [id number]  Ortho Devices Ortho Device/Splint Location: foot roll Ortho Device/Splint Interventions: Application   [place] Pink 09/26/2016, 10:23 AM

## 2016-09-26 NOTE — Op Note (Signed)
PATIENT ID:      Jesus Oconnor  MRN:     [id number] DOB/AGE:    December 23, 1933 / 83 y.o.       OPERATIVE REPORT    DATE OF PROCEDURE:  09/26/2016       PREOPERATIVE DIAGNOSIS:   RIGHT KNEE OSTEOARTHRITIS      Estimated body mass index is 29.15 kg/m as calculated from the following:   Height as of 09/14/16: 5\' 11"  (1.803 m).   Weight as of this encounter: 94.8 kg (209 lb).                                                        POSTOPERATIVE DIAGNOSIS:   RIGHT KNEE OSTEOARTHRITIS                                                                      PROCEDURE:  Procedure(s): TOTAL KNEE ARTHROPLASTY Using DepuyAttune RP implants #8R Femur, #9Tibia, 7 mm Attune RP bearing, 41 Patella     SURGEON: Ahlaya Ende J    ASSISTANT:   [employer]   (Present and scrubbed throughout the case, critical for assistance with exposure, retraction, instrumentation, and closure.)         ANESTHESIA: Spinal, 20cc Exparel, 50cc 0.25% Marcaine  EBL: 300  FLUID REPLACEMENT: 1500 crystalloid  TOURNIQUET TIME: 53min  Drains: None  Tranexamic Acid: 1gm IV, 2gm topical  COMPLICATIONS:  None         INDICATIONS FOR PROCEDURE: The patient has  RIGHT KNEE OSTEOARTHRITIS, Var deformities, XR shows bone on bone arthritis, lateral subluxation of tibia. Patient has failed all conservative measures including anti-inflammatory medicines, narcotics, attempts at  exercise and weight loss, cortisone injections and viscosupplementation.  Risks and benefits of surgery have been discussed, questions answered.   DESCRIPTION OF PROCEDURE: The patient identified by armband, received  IV antibiotics, in the holding area at [facility]. Patient taken to the operating room, appropriate anesthetic  monitors were attached, and Spinal anesthesia was  induced. Tourniquet  applied high to the operative thigh. Lateral post and foot positioner  applied to the table, the lower extremity was then prepped and draped  in usual  sterile fashion from the toes to the tourniquet. Time-out procedure was performed. We began the operation, with the knee flexed 120 degrees, by making the anterior midline incision starting at handbreadth above the patella going over the patella 1 cm medial to and 4 cm distal to the tibial tubercle. Small bleeders in the skin and the  subcutaneous tissue identified and cauterized. Transverse retinaculum was incised and reflected medially and a medial parapatellar arthrotomy was accomplished. the patella was everted and theprepatellar fat pad resected. The superficial medial collateral  ligament was then elevated from anterior to posterior along the proximal  flare of the tibia and anterior half of the menisci resected. The knee was hyperflexed exposing bone on bone arthritis. Peripheral and notch osteophytes as well as the cruciate ligaments were then resected. We continued to  work our way around posteriorly along the proximal tibia, and  externally  rotated the tibia subluxing it out from underneath the femur. A McHale  retractor was placed through the notch and a lateral Hohmann retractor  placed, and we then drilled through the proximal tibia in line with the  axis of the tibia followed by an intramedullary guide rod and 2-degree  posterior slope cutting guide. The tibial cutting guide, 3 degree posterior sloped, was pinned into place allowing resection of 0 mm of bone medially and 12 mm of bone laterally. Satisfied with the tibial resection, we then  entered the distal femur 2 mm anterior to the PCL origin with the  intramedullary guide rod and applied the distal femoral cutting guide  set at 9 mm, with 5 degrees of valgus. This was pinned along the  epicondylar axis. At this point, the distal femoral cut was accomplished without difficulty. We then sized for a #8R femoral component and pinned the guide in 3 degrees of external rotation. The chamfer cutting guide was pinned into place. The anterior,  posterior, and chamfer cuts were accomplished without difficulty followed by  the Attune RP box cutting guide and the box cut. We also removed posterior osteophytes from the posterior femoral condyles. At this  time, the knee was brought into full extension. We checked our  extension and flexion gaps and found them symmetric for a 7 mm bearing. Distracting in extension with a lamina spreader, the posterior horns of the menisci were removed, and Exparel, diluted to 60 cc, with 20cc NS, and 20cc 0.5% Marcaine,was injected into the capsule and synovium of the knee. The posterior patella cut was accomplished with the 9.5 mm Attune cutting guide, sized for a 50mm dome, and the fixation pegs drilled.The knee  was then once again hyperflexed exposing the proximal tibia. We sized for a # 9 tibial base plate, applied the smokestack and the conical reamer followed by the the Delta fin keel punch. We then hammered into place the Attune RP trial femoral component, drilled the lugs, inserted a  7 mm trial bearing, trial patellar button, and took the knee through range of motion from 0-130 degrees. No thumb pressure was required for patellar Tracking. At this point, the limb was wrapped with an Esmarch bandage and the tourniquet inflated to 350 mmHg. All trial components were removed, mating surfaces irrigated with pulse lavage, and dried with suction and sponges. 10 cc of the Exparel solution was applied to the cancellus bone of the patella distal femur and proximal tibia.  After waiting 1 minute, the bony surfaces were again, dried with sponges. A double batch of DePuy HV cement with 1500 mg of Zinacef was mixed and applied to all bony metallic mating surfaces except for the posterior condyles of the femur itself. In order, we hammered into place the tibial tray and removed excess cement, the femoral component and removed excess cement. The final Attune RP bearing  was inserted, and the knee brought to full extension with  compression.  The patellar button was clamped into place, and excess cement  removed. While the cement cured the wound was irrigated out with normal saline solution pulse lavage. Ligament stability and patellar tracking were checked and found to be excellent. The parapatellar arthrotomy was closed with  running #1 Vicryl suture. The subcutaneous tissue with 0 and 2-0 undyed  Vicryl suture, and the skin with running 3-0 SQ vicryl. A dressing of Xeroform,  4 x 4, dressing sponges, Webril, and Ace wrap applied. The patient  awakened, and taken to  recovery room without difficulty.   Abrielle Finck J 09/26/2016, 8:50 AM

## 2016-09-27 ENCOUNTER — Encounter (HOSPITAL_COMMUNITY): Payer: Self-pay | Admitting: Orthopedic Surgery

## 2016-09-27 LAB — BASIC METABOLIC PANEL
ANION GAP: 7 (ref 5–15)
BUN: 14 mg/dL (ref 6–20)
CALCIUM: 8.5 mg/dL — AB (ref 8.9–10.3)
CHLORIDE: 108 mmol/L (ref 101–111)
CO2: 25 mmol/L (ref 22–32)
Creatinine, Ser: 1.05 mg/dL (ref 0.61–1.24)
GFR calc non Af Amer: >60 mL/min (ref >60)
Glucose, Bld: 117 mg/dL — ABNORMAL HIGH (ref 65–99)
Potassium: 4 mmol/L (ref 3.5–5.1)
Sodium: 140 mmol/L (ref 135–145)

## 2016-09-27 LAB — CBC
HEMATOCRIT: 29.5 % — AB (ref 39.0–52.0)
HEMOGLOBIN: 9.7 g/dL — AB (ref 13.0–17.0)
MCH: 30.6 pg (ref 26.0–34.0)
MCHC: 32.9 g/dL (ref 30.0–36.0)
MCV: 93.1 fL (ref 78.0–100.0)
Platelets: 147 10*3/uL — ABNORMAL LOW (ref 150–400)
RBC: 3.17 MIL/uL — ABNORMAL LOW (ref 4.22–5.81)
RDW: 13.4 % (ref 11.5–15.5)
WBC: 10.8 10*3/uL — AB (ref 4.0–10.5)

## 2016-09-27 LAB — HEMOGLOBIN A1C
HEMOGLOBIN A1C: 5.7 % — AB (ref 4.8–5.6)
MEAN PLASMA GLUCOSE: 117 mg/dL

## 2016-09-27 LAB — GLUCOSE, CAPILLARY
GLUCOSE-CAPILLARY: 134 mg/dL — AB (ref 65–99)
GLUCOSE-CAPILLARY: 124 mg/dL — AB (ref 65–99)
GLUCOSE-CAPILLARY: 137 mg/dL — AB (ref 65–99)
Glucose-Capillary: 157 mg/dL — ABNORMAL HIGH (ref 65–99)

## 2016-09-27 NOTE — Progress Notes (Signed)
Physical Therapy Treatment Patient Details Name: Jesus Oconnor MRN: [id number] DOB: 05-17-1933 Today's Date: 09/27/2016    History of Present Illness Pt is an 83 y/o male admitted secondary to elective R TKA. 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     PT Comments    Upon arrival pt seated in recliner chair with Rt LE in immobilizer; however foot rest was half way up and pt's knee in flexed position. Pt educated on proper positioning. Pt required less physical assistance with transfers today and was able to ambulate further using RW and VCs for sequencing and direction. Pt became fatigued after several standing exercises and requested pain meds. Nursing notified. Pt will continue to benefit from acute therapy for positioning and DME instruction, mobilization and strengthening for safe d/c.    Follow Up Recommendations  SNF     Equipment Recommendations  None recommended by PT    Recommendations for Other Services       Precautions / Restrictions Precautions Precautions: Knee;Fall Precaution Booklet Issued: Yes (comment) (issued by PT) Restrictions Weight Bearing Restrictions: Yes RLE Weight Bearing: Weight bearing as tolerated    Mobility  Bed Mobility           General bed mobility comments: Pt in recliner chair upon arrival   Transfers Overall transfer level: Needs assistance Equipment used: Rolling walker (2 wheeled) Transfers: Sit to/from Stand Sit to Stand: Min assist         General transfer comment: Min A to rise. VCs for sequencing and hand placement   Ambulation/Gait Ambulation/Gait assistance: Min assist Ambulation Distance (Feet): 60 Feet Assistive device: Rolling walker (2 wheeled) Gait Pattern/deviations: Decreased weight shift to right;Antalgic;Step-through pattern Gait velocity: decreased  Gait velocity interpretation: Below normal speed for age/gender General  Gait Details: Pt with slow, antalgic gait and decreased weight bearing and shifting to R. Pt attempts to bear a good amount of weight through UE to not bear weight on R LE.    Stairs            Wheelchair Mobility    Modified Rankin (Stroke Patients Only)       Balance Overall balance assessment: Needs assistance Sitting-balance support: No upper extremity supported;Feet supported Sitting balance-Leahy Scale: Fair     Standing balance support: Bilateral upper extremity supported;During functional activity Standing balance-Leahy Scale: Poor Standing balance comment: Pt relies heavily on RW for Bil UE support during ambulation. VCs for pt to decrease pushing weight through RW.                             Cognition Arousal/Alertness: Awake/alert Behavior During Therapy: WFL for tasks assessed/performed Overall Cognitive Status: Within Functional Limits for tasks assessed                                        Exercises Total Joint Exercises Quad Sets: AROM;Right;10 reps;Seated Heel Slides: Seated;AROM;Right;10 reps Hip ABduction/ADduction: Standing;Right;10 reps;AROM Goniometric ROM: 6-85 AROM in sitting Marching in Standing: AROM;Right;10 reps;Standing    General Comments        Pertinent Vitals/Pain Pain Assessment: 0-10 Pain Score: 5  Faces Pain Scale: Hurts a little bit Pain Location: R knee  Pain Descriptors / Indicators: Aching;Sore Pain Intervention(s): Limited activity within patient's tolerance;Monitored during  session;Repositioned;Ice applied;Patient requesting pain meds-RN notified    Home Living Family/patient expects to be discharged to:: [facility]: One level Home Equipment: Clinical cytogeneticist - 2 wheels Additional Comments: Possibly Engineer, water for placement     Prior Function Level of Independence: Independent with assistive device(s)      Comments: Used cane for  ambulation. For bad days, used RW, completing ADLs with mod independence; Pt lives with roommate however roommate works most of the day and is not available to assist    PT Goals (current goals can now be found in the care plan section) Acute Rehab PT Goals Patient Stated Goal: decrease pain  PT Goal Formulation: With patient Time For Goal Achievement: 10/11/16 Potential to Achieve Goals: Good Progress towards PT goals: Progressing toward goals    Frequency    7X/week (QD)      PT Plan Current plan remains appropriate    Co-evaluation              AM-PAC PT "6 Clicks" Daily Activity  Outcome Measure  Difficulty turning over in bed (including adjusting bedclothes, sheets and blankets)?: Total Difficulty moving from lying on back to sitting on the side of the bed? : Total Difficulty sitting down on and standing up from a chair with arms (e.g., wheelchair, bedside commode, etc,.)?: A Little Help needed moving to and from a bed to chair (including a wheelchair)?: A Little Help needed walking in hospital room?: A Little Help needed climbing 3-5 steps with a railing? : A Lot 6 Click Score: 13    End of Session Equipment Utilized During Treatment: Gait belt Activity Tolerance: Patient tolerated treatment well Patient left: in chair;with call bell/phone within reach Nurse Communication: Patient requests pain meds PT Visit Diagnosis: Other abnormalities of gait and mobility (R26.89);Pain;Difficulty in walking, not elsewhere classified (R26.2) Pain - Right/Left: Right Pain - part of body: Knee     Time: 1140-1210 PT Time Calculation (min) (ACUTE ONLY): 30 min  Charges:                       G Codes:       Elberta Leatherwood, SPT Acute Rehab [place] 09/27/2016, 2:54 PM

## 2016-09-27 NOTE — Clinical Social Work Note (Addendum)
Clinical Social Work Assessment  Patient Details  Name: Jesus Oconnor MRN: [id number] Date of Birth: Oct 29, 1933  Date of referral:  09/27/16               Reason for consult:  Facility Placement                Permission sought to share information with:  Facility Art therapist granted to share information::  Yes, Verbal Permission Granted  Name::     daughter  Agency::  SNF  Relationship::     Contact Information:     Housing/Transportation Living arrangements for the past 2 months:  Single Family Home Source of Information:  Patient Patient Interpreter Needed:  None Criminal Activity/Legal Involvement Pertinent to Current Situation/Hospitalization:  No - Comment as needed Significant Relationships:  Adult Children Lives with:  Self Do you feel safe going back to the place where you live?  No Need for family participation in patient care:  Yes (Comment)  Care giving concerns:  Pt resided at home alone prior to hospitalization. Pt utilized a Programmer, multimedia for ambulation. Pt not safe to return home at this time and is agreeable to short term rehab.  Social Worker assessment / plan:  CSW met with patient at bedside to discuss clinical teams recommendation for short term rehab at DC.  CSW explained her role and the SNF options/placement. CSW obtained verbal permission to send out offers. Pt unsure about [facility], however it appears that is where daughter wants patient to go. Pt amenable to SNF placement.   CSW left message for daughter Jeannene Patella to call CSW back to discuss DC plan. CSW will continue to follow up.  FL2 complete. Passr confirmed. Offers sent.  Employment status:  Retired Nurse, adult PT Recommendations:  [place] / Referral to community resources:  [place]  Patient/Family's Response to care:  Patient has no issues with care at this time.  Patient/Family's Understanding of  and Emotional Response to Diagnosis, Current Treatment, and Prognosis:  Patient has good understanding of diagnosis, current treatment and prognosis. Patient is hopeful that short term rehab will address impairment. No issues or concerns identified at this time.  Emotional Assessment Appearance:  Appears stated age Attitude/Demeanor/Rapport:   (Cooperative) Affect (typically observed):  Accepting, Appropriate Orientation:  Oriented to Self, Oriented to Place, Oriented to  Time, Oriented to Situation Alcohol / Substance use:  Not Applicable Psych involvement (Current and /or in the community):  No (Comment)  Discharge Needs  Concerns to be addressed:  Care Coordination Readmission within the last 30 days:  No Current discharge risk:  Dependent with Mobility, Lives alone, Physical Impairment Barriers to Discharge:  No Barriers Identified   Normajean Baxter, LCSW 09/27/2016, 11:40 AM

## 2016-09-27 NOTE — Evaluation (Signed)
Occupational Therapy Evaluation Patient Details Name: Jesus Oconnor MRN: [id number] DOB: [birth year] Today's Date: 09/27/2016    History of Present Illness Pt is an 83 y/o male admitted secondary to elective R TKA. 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    Clinical Impression   This 83 y/o M presents with the above. At baseline Pt is mod independent with functional mobility and ADLs. Pt currently requires MinA for functional mobility, ModA sit<>stand, MaxA for LB ADLs. Pt lives with a roommate who works and is unable to provide necessary assist after discharge. Pt will benefit from continued acute OT services and further OT services in [facility] re[facility] SNF setting prior to return home to maximize Pt's safety and independence with completing ADLs and functional mobility.     Follow Up Recommendations  SNF;Supervision/Assistance - 24 hour    Equipment Recommendations  Other (comment) (TBD in next venue of care )           Precautions / Restrictions Precautions Precautions: Knee;Fall Precaution Booklet Issued: Yes (comment) (issued by PT) Restrictions Weight Bearing Restrictions: Yes RLE Weight Bearing: Weight bearing as tolerated      Mobility Bed Mobility Overal bed mobility: Needs Assistance Bed Mobility: Supine to Sit     Supine to sit: Mod assist     General bed mobility comments: Mod A for RLE management   Transfers Overall transfer level: Needs assistance Equipment used: Rolling walker (2 wheeled) Transfers: Sit to/from Stand Sit to Stand: Mod assist         General transfer comment: assist to rise and steady, verbal cues for hand/LE placement     Balance Overall balance assessment: Needs assistance Sitting-balance support: No upper extremity supported;Feet supported Sitting balance-Leahy Scale: Fair     Standing balance support: Bilateral upper extremity supported;During  functional activity;Single extremity supported Standing balance-Leahy Scale: Poor Standing balance comment: Pt utilizing single UE support while washing hands, reliant on RW during functional mobility                            ADL either performed or assessed with clinical judgement   ADL Overall ADL's : Needs assistance/impaired Eating/Feeding: Set up;Sitting   Grooming: Wash/dry hands;Min guard;Standing   Upper Body Bathing: Min guard;Sitting   Lower Body Bathing: Moderate assistance;Sit to/from stand   Upper Body Dressing : Min guard;Sitting   Lower Body Dressing: Sit to/from stand;Moderate assistance   Toilet Transfer: Minimal assistance;Ambulation;Comfort height toilet;RW Armed forces technical officer Details (indicate cue type and reason): Pt completed standing toileting during session; toilet transfer simulated with transfer to chair Toileting- Clothing Manipulation and Hygiene: Minimal assistance;Sit to/from stand       Functional mobility during ADLs: Minimal assistance;Rolling walker General ADL Comments: Pt completed room level functional mobility, standing toileting, standing grooming ADLs, answered questions regarding ST rehab in SNF setting                          Pertinent Vitals/Pain Pain Assessment: Faces Faces Pain Scale: Hurts a little bit Pain Location: R knee  Pain Descriptors / Indicators: Aching;Sore Pain Intervention(s): Limited activity within patient's tolerance;Monitored during session;Premedicated before session;Ice applied     Hand Dominance Right   Extremity/Trunk Assessment Upper Extremity Assessment Upper Extremity Assessment: Generalized weakness   Lower Extremity Assessment Lower Extremity Assessment: Defer to PT evaluation  Cervical / Trunk Assessment Cervical / Trunk Assessment: Kyphotic   Communication Communication Communication: No difficulties   Cognition Arousal/Alertness: Awake/alert Behavior During Therapy: WFL  for tasks assessed/performed Overall Cognitive Status: Within Functional Limits for tasks assessed                                                      Home Living Family/patient expects to be discharged to:: [place]: One level     Bathroom Shower/Tub: International aid/development worker Accessibility: Yes How Accessible: Accessible via walker Home Equipment: Clinical cytogeneticist - 2 wheels   Additional Comments: Possibly Engineer, water for placement       Prior Functioning/Environment Level of Independence: Independent with assistive device(s)        Comments: Used cane for ambulation. For bad days, used RW, completing ADLs with mod independence; Pt lives with roommate however roommate works most of the day and is not available to assist         OT Problem List: Decreased strength;Impaired balance (sitting and/or standing);Decreased activity tolerance;Decreased range of motion      OT Treatment/Interventions: Self-care/ADL training;DME and/or AE instruction;Therapeutic activities;Balance training;Therapeutic exercise;Patient/family education    OT Goals(Current goals can be found in the care plan section) Acute Rehab OT Goals Patient Stated Goal: to go to rehab to get stronger before going home  OT Goal Formulation: With patient Time For Goal Achievement: 10/11/16 Potential to Achieve Goals: Good ADL Goals Pt Will Perform Grooming: with modified independence;standing Pt Will Perform Upper Body Bathing: with modified independence;sitting Pt Will Perform Upper Body Dressing: with modified independence;sitting Pt Will Perform Lower Body Dressing: with min assist;sit to/from stand Pt Will Transfer to Toilet: with min guard assist;ambulating;bedside commode (BSC over toilet ) Pt Will Perform Toileting - Clothing Manipulation and hygiene: sit to/from stand;with modified independence Pt Will Perform Tub/Shower  Transfer: Tub transfer;with min guard assist;ambulating;rolling walker;3 in 1  OT Frequency: Min 2X/week                         AM-PAC PT "6 Clicks" Daily Activity     Outcome Measure Help from another person eating meals?: None Help from another person taking care of personal grooming?: A Little Help from another person toileting, which includes using toliet, bedpan, or urinal?: A Lot Help from another person bathing (including washing, rinsing, drying)?: A Lot Help from another person to put on and taking off regular upper body clothing?: A Little Help from another person to put on and taking off regular lower body clothing?: A Lot 6 Click Score: 16   End of Session Equipment Utilized During Treatment: Gait belt;Rolling walker Nurse Communication: Mobility status  Activity Tolerance: Patient tolerated treatment well Patient left: in chair;with call bell/phone within reach  OT Visit Diagnosis: Muscle weakness (generalized) (M62.81);Unsteadiness on feet (R26.81)                Time: 1001-1027 OT Time Calculation (min): 26 min Charges:  OT General Charges $OT Visit: 1 Procedure OT Evaluation $OT Eval Low Complexity: 1 Procedure OT Treatments $Self Care/Home Management : 8-22 mins G-Codes:     Lou Cal, OT Pager [phone] 09/27/2016  Raymondo Band 09/27/2016, 11:21  AM

## 2016-09-27 NOTE — NC FL2 (Signed)
[place] MEDICAID FL2 LEVEL OF CARE SCREENING TOOL     IDENTIFICATION  Patient Name: Jesus Oconnor Birthdate: 12/15/1933 Sex: male Admission Date (Current Location): 09/26/2016  [facility] and [place] Number:  Herbalist and Address:  [facility], [employer] [address] [place], [place] [address]      Provider Number: [id number]  Attending Physician Name and Address:  Frederik Pear, MD  Relative Name and Phone Number:  daughter, Cordelia Pen [phone]    Current Level of Care: Hospital Recommended Level of Care: [place] Prior Approval Number:    Date Approved/Denied: 09/27/16 PASRR Number: [id number]  Discharge Plan: SNF    Current Diagnoses: Patient Active Problem List   Diagnosis Date Noted  . Primary osteoarthritis of right knee 09/26/2016  . Localized osteoarthritis of right knee 09/16/2016    Class: Chronic  . S/P CABG x 5 11/27/2015  . Chronic systolic CHF (congestive heart failure) ([place]) 09/09/2015  . CAD in native artery   . Other fatigue 08/19/2015  . Cardiomyopathy, ischemic 08/19/2015  . Essential hypertension 08/19/2015  . Atrial flutter, unspecified 08/19/2015  . NSVT (nonsustained ventricular tachycardia) ([place]) 08/05/2014  . PAT (paroxysmal atrial tachycardia) ([place]) 06/26/2013  . Chronic diastolic heart failure ([place]) 03/25/2013  . Pacemaker 05/23/2012  . CAD (coronary artery disease) 11/15/2010  . Dyslipidemia   . DM2 (diabetes mellitus, type 2) ([place])   . Atrial flutter (H[facility])     Orientation RESPIRATION BLADDER Height & Weight     Self, Time, Situation, Place  Normal Continent Weight: 209 lb (94.8 kg) Height:     BEHAVIORAL SYMPTOMS/MOOD NEUROLOGICAL BOWEL NUTRITION STATUS      Continent Diet (Carb Modified Diet, See DC Summary)  AMBULATORY STATUS COMMUNICATION OF NEEDS Skin   Limited Assist Verbally Surgical wounds (Right Knee Closed Incision with Compression Wrap)                        Personal Care Assistance Level of Assistance  Bathing, Feeding, Dressing Bathing Assistance: Maximum assistance Feeding assistance: Limited assistance Dressing Assistance: Limited assistance     Functional Limitations Info             SPECIAL CARE FACTORS FREQUENCY  PT (By licensed PT), OT (By licensed OT)     PT Frequency: 7x week OT Frequency: 2x week            Contractures      Additional Factors Info  Code Status, Allergies, Insulin Sliding Scale Code Status Info: Full code Allergies Info: No Known Allergies   Insulin Sliding Scale Info: 15 units 3x's day       Current Medications (09/27/2016):  This is the current hospital active medication list Current Facility-Administered Medications  Medication Dose Route Frequency Provider Last Rate Last Dose  . acetaminophen (TYLENOL) tablet 650 mg  650 mg Oral Q6H PRN Frederik Pear, MD   650 mg at 09/27/16 7412   Or  . acetaminophen (TYLENOL) suppository 650 mg  650 mg Rectal Q6H PRN Frederik Pear, MD      . alum & mag hydroxide-simeth (MAALOX/MYLANTA) 200-200-20 MG/5ML suspension 30 mL  30 mL Oral Q4H PRN Frederik Pear, MD      . amLODipine (NORVASC) tablet 10 mg  10 mg Oral Daily Frederik Pear, MD   10 mg at 09/27/16 0816  . aspirin EC tablet 325 mg  325 mg Oral Q breakfast Frederik Pear, MD   325 mg at  09/27/16 0815  . bisacodyl (DULCOLAX) EC tablet 5 mg  5 mg Oral Daily PRN Frederik Pear, MD   5 mg at 09/27/16 0815  . carvedilol (COREG) tablet 12.5 mg  12.5 mg Oral BID WC Frederik Pear, MD   12.5 mg at 09/27/16 0816  . dextrose 5 % and 0.45 % NaCl with KCl 20 mEq/L infusion   Intravenous Continuous Frederik Pear, MD   Stopped at 09/27/16 0330  . diphenhydrAMINE (BENADRYL) 12.5 MG/5ML elixir 12.5-25 mg  12.5-25 mg Oral Q4H PRN Frederik Pear, MD   25 mg at 09/26/16 2147  . docusate sodium (COLACE) capsule 100 mg  100 mg Oral BID Frederik Pear, MD   100 mg at 09/27/16 0816  . furosemide (LASIX) tablet 40 mg  40 mg Oral Daily  Frederik Pear, MD   40 mg at 09/27/16 0816  . HYDROmorphone (DILAUDID) injection 0.5 mg  0.5 mg Intravenous Q2H PRN Frederik Pear, MD      . ibuprofen (ADVIL,MOTRIN) tablet 400 mg  400 mg Oral Q6H PRN Frederik Pear, MD      . insulin aspart (novoLOG) injection 0-15 Units  0-15 Units Subcutaneous TID WC Frederik Pear, MD   2 Units at 09/27/16 0815  . lisinopril (PRINIVIL,ZESTRIL) tablet 20 mg  20 mg Oral BID Frederik Pear, MD   20 mg at 09/27/16 0815  . menthol-cetylpyridinium (CEPACOL) lozenge 3 mg  1 lozenge Oral PRN Frederik Pear, MD       Or  . phenol (CHLORASEPTIC) mouth spray 1 spray  1 spray Mouth/Throat PRN Frederik Pear, MD      . metFORMIN (GLUCOPHAGE) tablet 1,000 mg  1,000 mg Oral BID WC Frederik Pear, MD   1,000 mg at 09/27/16 0814  . methocarbamol (ROBAXIN) tablet 500 mg  500 mg Oral Q6H PRN Frederik Pear, MD   500 mg at 09/26/16 2024   Or  . methocarbamol (ROBAXIN) 500 mg in dextrose 5 % 50 mL IVPB  500 mg Intravenous Q6H PRN Frederik Pear, MD      . metoCLOPramide (REGLAN) tablet 5-10 mg  5-10 mg Oral Q8H PRN Frederik Pear, MD       Or  . metoCLOPramide (REGLAN) injection 5-10 mg  5-10 mg Intravenous Q8H PRN Frederik Pear, MD      . ondansetron [facility]) tablet 4 mg  4 mg Oral Q6H PRN Frederik Pear, MD       Or  . ondansetron [facility]) injection 4 mg  4 mg Intravenous Q6H PRN Frederik Pear, MD      . oxyCODONE (Oxy IR/ROXICODONE) immediate release tablet 5-10 mg  5-10 mg Oral Q3H PRN Frederik Pear, MD   5 mg at 09/27/16 0354  . potassium chloride SA (K-DUR,KLOR-CON) CR tablet 20 mEq  20 mEq Oral Daily Frederik Pear, MD   20 mEq at 09/27/16 0815  . pravastatin (PRAVACHOL) tablet 20 mg  20 mg Oral Daily Frederik Pear, MD   20 mg at 09/27/16 0815  . senna-docusate (Senokot-S) tablet 1 tablet  1 tablet Oral QHS PRN Frederik Pear, MD   1 tablet at 09/27/16 0815  . sodium phosphate (FLEET) 7-19 GM/118ML enema 1 enema  1 enema Rectal Once PRN Frederik Pear, MD      . traZODone (DESYREL) tablet 50 mg  50 mg  Oral QHS PRN Frederik Pear, MD         Discharge Medications: Please see discharge summary for a list of discharge medications.  Relevant Imaging Results:  Relevant Lab Results:  Additional Information SS#: [id number]  Normajean Baxter, LCSW

## 2016-09-27 NOTE — Progress Notes (Signed)
PATIENT ID: Jesus Oconnor  MRN: [id number]  DOB/AGE:  13-Mar-1933 / 83 y.o.  1 Day Post-Op Procedure(s) (LRB): TOTAL KNEE ARTHROPLASTY (Right)    PROGRESS NOTE Subjective: Patient is alert, oriented, No Nausea, No Vomiting, yes passing gas. Taking PO Well. Denies SOB, Chest or Calf Pain. Using Incentive Spirometer, PAS in place. Ambulate 10 feet, Patient reports pain as 5/10 .    Objective: Vital signs in last 24 hours: Vitals:   09/26/16 1120 09/26/16 2100 09/27/16 0100 09/27/16 0630  BP: (!) 153/74 (!) 127/57 120/60 (!) 123/55  Pulse: 66 64 62 (!) 59  Resp: 15 15 16 18   Temp: 97.6 F (36.4 C) (!) 97.5 F (36.4 C) 98.3 F (36.8 C) 98.3 F (36.8 C)  TempSrc: Oral Oral Oral Oral  SpO2: 95% 94% 94% 95%  Weight:          Intake/Output from previous day: I/O last 3 completed shifts: In: 8828 [P.O.:620; I.V.:1200] Out: 2300 [Urine:2000; Blood:300]   Intake/Output this shift: No intake/output data recorded.   LABORATORY DATA:  Recent Labs  09/26/16 1655 09/26/16 2134 09/27/16 0254 09/27/16 0636  WBC  --   --  10.8*  --   HGB  --   --  9.7*  --   HCT  --   --  29.5*  --   PLT  --   --  147*  --   NA  --   --  140  --   K  --   --  4.0  --   CL  --   --  108  --   CO2  --   --  25  --   BUN  --   --  14  --   CREATININE  --   --  1.05  --   GLUCOSE  --   --  117*  --   GLUCAP 341* 162*  --  134*  CALCIUM  --   --  8.5*  --     Examination: Neurologically intact ABD soft Neurovascular intact Sensation intact distally Intact pulses distally Dorsiflexion/Plantar flexion intact Incision: dressing was soaked through, Aquacel removed.  Wound was cleansed with peroxide, 4 x 4 and Ace wrap dressing applied without difficulty.  No active bleeding No cellulitis present Compartment soft}  Assessment:   1 Day Post-Op Procedure(s) (LRB): TOTAL KNEE ARTHROPLASTY (Right) ADDITIONAL DIAGNOSIS: Expected Acute Blood Loss Anemia, Diabetes and Hypertension, Chronic  diastolic heart failure, history of coronary artery disease with CABG 5, ischemic cardiomyopathy  Plan: PT/OT WBAT, AROM and PROM  DVT Prophylaxis:  SCDx72hrs, ASA 325 mg BID x 2 weeks DISCHARGE PLAN: Skilled Nursing Facility/Rehab, Family prefers [facility] DISCHARGE NEEDS: HHPT, Walker and 3-in-1 comode seat     Criss Bartles J 09/27/2016, 7:40 AM Patient ID: Jesus Oconnor, male   DOB: Jan 06, 1934, 83 y.o.   MRN: [id number]

## 2016-09-28 LAB — CBC
HCT: 27.8 % — ABNORMAL LOW (ref 39.0–52.0)
HEMOGLOBIN: 9.2 g/dL — AB (ref 13.0–17.0)
MCH: 31.1 pg (ref 26.0–34.0)
MCHC: 33.1 g/dL (ref 30.0–36.0)
MCV: 93.9 fL (ref 78.0–100.0)
PLATELETS: 120 10*3/uL — AB (ref 150–400)
RBC: 2.96 MIL/uL — AB (ref 4.22–5.81)
RDW: 13.9 % (ref 11.5–15.5)
WBC: 8.1 10*3/uL (ref 4.0–10.5)

## 2016-09-28 LAB — GLUCOSE, CAPILLARY
Glucose-Capillary: 155 mg/dL — ABNORMAL HIGH (ref 65–99)
Glucose-Capillary: 165 mg/dL — ABNORMAL HIGH (ref 65–99)

## 2016-09-28 NOTE — Progress Notes (Signed)
PATIENT ID: KAIDE GAGE  MRN: [id number]  DOB/AGE:  05/01/1933 / 83 y.o.  2 Days Post-Op Procedure(s) (LRB): TOTAL KNEE ARTHROPLASTY (Right)    PROGRESS NOTE Subjective: Patient is alert, oriented, no Nausea, no Vomiting, yes passing gas. Taking PO well. Denies SOB, Chest or Calf Pain. Using Incentive Spirometer, PAS in place. Ambulate WBAT with pt walking 60 ft with therapy, Patient reports pain as moderate.    Objective: Vital signs in last 24 hours: Vitals:   09/27/16 2134 09/28/16 0047 09/28/16 0502 09/28/16 0702  BP: (!) 157/52  (!) 140/51   Pulse: 63  70   Resp: 16  18   Temp: (!) 100.9 F (38.3 C) 100.1 F (37.8 C) (!) 100.4 F (38 C) 99.3 F (37.4 C)  TempSrc: Oral Oral Oral Oral  SpO2: 95%  98%   Weight:          Intake/Output from previous day: I/O last 3 completed shifts: In: 800 [P.O.:800] Out: 1000 [Urine:1000]   Intake/Output this shift: No intake/output data recorded.   LABORATORY DATA:  Recent Labs  09/27/16 0254  09/27/16 1659 09/27/16 2138 09/28/16 0520 09/28/16 0700  WBC 10.8*  --   --   --  8.1  --   HGB 9.7*  --   --   --  9.2*  --   HCT 29.5*  --   --   --  27.8*  --   PLT 147*  --   --   --  120*  --   NA 140  --   --   --   --   --   K 4.0  --   --   --   --   --   CL 108  --   --   --   --   --   CO2 25  --   --   --   --   --   BUN 14  --   --   --   --   --   CREATININE 1.05  --   --   --   --   --   GLUCOSE 117*  --   --   --   --   --   GLUCAP  --   < > 137* 157*  --  155*  CALCIUM 8.5*  --   --   --   --   --   < > = values in this interval not displayed.  Examination: Neurologically intact Neurovascular intact Sensation intact distally Intact pulses distally Dorsiflexion/Plantar flexion intact Incision: moderate drainage No cellulitis present Compartment soft} Pt's dressing changed with new aquacel and ace bandage  Assessment:   2 Days Post-Op Procedure(s) (LRB): TOTAL KNEE ARTHROPLASTY (Right) ADDITIONAL  DIAGNOSIS: Expected Acute Blood Loss Anemia, Diabetes, Hypertension and Chronic diastolic heart failure, history of coronary artery disease with CABG 5, ischemic cardiomyopathy  Plan: PT/OT WBAT, AROM and PROM  DVT Prophylaxis:  SCDx72hrs, ASA 325 mg BID x 2 weeks DISCHARGE PLAN: Skilled Nursing Facility/Rehab, and family prefers [place]: HHPT, Walker and 3-in-1 comode seat     Anahita Cua R 09/28/2016, 7:33 AM

## 2016-09-28 NOTE — Progress Notes (Signed)
Reviewed discharge with daughter and Mr Malaki, [facility] to give report spoke with nurse pt is going to room 908

## 2016-09-28 NOTE — Social Work (Signed)
CSW spoke with family and they have selected [facility]. CSW following up with SNF for DC and will await a call back.  Elissa Hefty, LCSW Clinical Social Worker [phone]

## 2016-09-28 NOTE — Discharge Summary (Signed)
Patient ID: Jesus Oconnor MRN: [id number] DOB/AGE: [birth year] 83 y.o.  Admit date: 09/26/2016 Discharge date: 09/28/2016  Admission Diagnoses:  Principal Problem:   Localized osteoarthritis of right knee Active Problems:   Primary osteoarthritis of right knee   Discharge Diagnoses:  Same  Past Medical History:  Diagnosis Date  . Anemia    history of  . Arthritis   . Atrial flutter (HCC)    severe bradycardia  . CHF (congestive heart failure) ([place])   . Constipation   . COPD (chronic obstructive pulmonary disease) ([place])   . Coronary artery disease   . Cough   . Diarrhea   . Diastolic dysfunction    Echo 07/07/6920-HCOBT I diastolic dysfunction,EF 94-99%  . DM2 (diabetes mellitus, type 2) ([place])   . Dyslipidemia   . History of kidney stones   . HTN (hypertension)   . Hyperlipidemia   . Leg swelling   . Mobitz type 1 second degree atrioventricular block   . Pacemaker 05/22/2012   Dr  Croitoru    Med[employer]onic Adapta  . 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 SVG to firts & second diagonals,SVG to forth posterolateral branch to RCA  . Sore throat   . Wheezing     Surgeries: Procedure(s): TOTAL KNEE ARTHROPLASTY on 09/26/2016   Consultants:   Discharged Condition: Improved  Hospital Course: Jesus Oconnor is an 83 y.o. male who was admitted 09/26/2016 for operative treatment ofLocalized osteoarthritis of right knee. Patient has severe unremitting pain that affects sleep, daily activities, and work/hobbies. After pre-op clearance the patient was taken to the operating room on 09/26/2016 and underwent  Procedure(s): TOTAL KNEE ARTHROPLASTY.    Patient was given perioperative antibiotics: Anti-infectives    Start     Dose/Rate Route Frequency Ordered Stop   09/26/16 0814  cefUROXime (ZINACEF) injection  Status:  Discontinued       As needed 09/26/16 0814 09/26/16 0931   09/26/16 0600  ceFAZolin (ANCEF) IVPB 2g/100 mL premix     2 g 200 mL/hr  over 30 Minutes Intravenous To [facility] 09/23/16 0852 09/26/16 0735       Patient was given sequential compression devices, early ambulation, and chemoprophylaxis to prevent DVT.  Patient benefited maximally from hospital stay and there were no complications.    Recent vital signs: Patient Vitals for the past 24 hrs:  BP Temp Temp src Pulse Resp SpO2  09/28/16 0702 - 99.3 F (37.4 C) Oral - - -  09/28/16 0502 (!) 140/51 (!) 100.4 F (38 C) Oral 70 18 98 %  09/28/16 0047 - 100.1 F (37.8 C) Oral - - -  09/27/16 2134 (!) 157/52 (!) 100.9 F (38.3 C) Oral 63 16 95 %  09/27/16 1343 (!) 125/51 99 F (37.2 C) Oral (!) 58 16 96 %     Recent laboratory studies:  Recent Labs  09/27/16 0254 09/28/16 0520  WBC 10.8* 8.1  HGB 9.7* 9.2*  HCT 29.5* 27.8*  PLT 147* 120*  NA 140  --   K 4.0  --   CL 108  --   CO2 25  --   BUN 14  --   CREATININE 1.05  --   GLUCOSE 117*  --   CALCIUM 8.5*  --      Discharge Medications:   Allergies as of 09/28/2016      Reactions   No Known Allergies       Medication List  STOP taking these medications   aspirin 81 MG tablet Replaced by:  aspirin EC 325 MG tablet   BC HEADACHE POWDER PO   ibuprofen 200 MG tablet Commonly known as:  ADVIL,MOTRIN     TAKE these medications   amLODipine 10 MG tablet Commonly known as:  NORVASC TAKE 1 TABLET BY MOUTH  DAILY What changed:  See the new instructions.   aspirin EC 325 MG tablet Take 1 tablet (325 mg total) by mouth 2 (two) times daily. Replaces:  aspirin 81 MG tablet   B-12 1000 MCG/ML Kit Inject 1 Dose as directed every 30 (thirty) days.   carvedilol 12.5 MG tablet Commonly known as:  COREG Take 12.5 mg by mouth 2 (two) times daily with a meal. What changed:  Another medication with the same name was removed. Continue taking this medication, and follow the directions you see here.   furosemide 40 MG tablet Commonly known as:  LASIX TAKE 1 TABLET BY MOUTH  DAILY    lisinopril 20 MG tablet Commonly known as:  PRINIVIL,ZESTRIL TAKE 1 TABLET BY MOUTH TWO  TIMES DAILY   metFORMIN 500 MG tablet Commonly known as:  GLUCOPHAGE Take 1,000 mg by mouth 2 (two) times daily with a meal.   metoprolol tartrate 25 MG tablet Commonly known as:  LOPRESSOR TAKE 1 TABLET BY MOUTH TWO  TIMES DAILY   ONETOUCH VERIO test strip Generic drug:  glucose blood daily. As directed   oxyCODONE-acetaminophen 5-325 MG tablet Commonly known as:  ROXICET Take 1 tablet by mouth every 4 (four) hours as needed.   potassium chloride SA 20 MEQ tablet Commonly known as:  K-DUR,KLOR-CON TAKE 1 TABLET BY MOUTH DAILY   pravastatin 20 MG tablet Commonly known as:  PRAVACHOL Take 20 mg by mouth daily.   tiZANidine 2 MG tablet Commonly known as:  ZANAFLEX Take 1 tablet (2 mg total) by mouth every 6 (six) hours as needed for muscle spasms.   traZODone 50 MG tablet Commonly known as:  DESYREL Take 50 mg by mouth at bedtime as needed for sleep.            Durable Medical Equipment        Start     Ordered   09/26/16 1118  DME Walker rolling  Once    Question:  Patient needs a walker to treat with the following condition  Answer:  Status post right knee replacement   09/26/16 1117   09/26/16 1118  DME 3 n 1  Once     09/26/16 1117   09/26/16 1118  DME Bedside commode  Once    Question:  Patient needs a bedside commode to treat with the following condition  Answer:  Status post right knee replacement   09/26/16 1117      Diagnostic Studies: Dg Chest 2 View  Result Date: 09/14/2016 CLINICAL DATA:  Preop knee arthroplasty EXAM: CHEST  2 VIEW COMPARISON:  08/27/2015 FINDINGS: Postop CABG and duo lead pacemaker, unchanged. Negative for heart failure. Atherosclerotic aortic arch. Linear density left lung base unchanged compatible scarring. Negative for heart failure, infiltrate, or effusion. IMPRESSION: No active cardiopulmonary disease. Electronically Signed   By:  Franchot Gallo M.D.   On: 09/14/2016 11:44    Disposition: 01-Home or Self Care  Discharge Instructions    Call MD / Call 911    Complete by:  As directed    If you experience chest pain or shortness of breath, CALL 911 and be transported to  the hospital emergency room.  If you develope a fever above 101 F, pus (white drainage) or increased drainage or redness at the wound, or calf pain, call your surgeon's office.   Constipation Prevention    Complete by:  As directed    Drink plenty of fluids.  Prune juice may be helpful.  You may use a stool softener, such as Colace (over the counter) 100 mg twice a day.  Use MiraLax (over the counter) for constipation as needed.   Diet - low sodium heart healthy    Complete by:  As directed    Driving restrictions    Complete by:  As directed    No driving for 2 weeks   Increase activity slowly as tolerated    Complete by:  As directed    Patient may shower    Complete by:  As directed    You may shower without a dressing once there is no drainage.  Do not wash over the wound.  If drainage remains, cover wound with plastic wrap and then shower.      Follow-up Information    Frederik Pear, MD Follow up in 2 week(s).   Specialty:  Orthopedic Surgery Contact information: [place] [address] [phone]            Signed: Hardin Negus Evelynn Hench R 09/28/2016, 7:40 AM

## 2016-09-28 NOTE — Progress Notes (Signed)
Physical Therapy Treatment Patient Details Name: Jesus Oconnor MRN: [id number] DOB: [birth year] Today's Date: 09/28/2016    History of Present Illness Pt is an 83 y/o male admitted secondary to elective R TKA. 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     PT Comments    Patient is making gradual progress toward mobility goals. Mod A for sit to stand transfer this session. Daughter present. Current plan remains appropriate.    Follow Up Recommendations  SNF     Equipment Recommendations  None recommended by PT    Recommendations for Other Services       Precautions / Restrictions Precautions Precautions: Knee;Fall Precaution Comments: reviewed precautions/positioning with pt and daughter Restrictions Weight Bearing Restrictions: Yes RLE Weight Bearing: Weight bearing as tolerated    Mobility  Bed Mobility               General bed mobility comments: Pt in recliner chair upon arrival   Transfers Overall transfer level: Needs assistance Equipment used: Rolling walker (2 wheeled) Transfers: Sit to/from Stand Sit to Stand: Mod assist         General transfer comment: assist to power up into standing and to steady; cues for safe hand placement and technique  Ambulation/Gait Ambulation/Gait assistance: Min assist Ambulation Distance (Feet): 80 Feet Assistive device: Rolling walker (2 wheeled) Gait Pattern/deviations: Decreased weight shift to right;Step-through pattern;Decreased stance time - right;Decreased step length - left;Antalgic;Trunk flexed Gait velocity: decreased    General Gait Details: cues for sequencing (stepping forward and backward), posture, proximity of RW, and R heel strike; pt with decreased R foot clearance   Stairs            Wheelchair Mobility    Modified Rankin (Stroke Patients Only)       Balance                                             Cognition Arousal/Alertness: Awake/alert Behavior During Therapy: Flat affect Overall Cognitive Status: Within Functional Limits for tasks assessed                                        Exercises Total Joint Exercises Quad Sets: AROM;Right;10 reps Heel Slides: AAROM;Right;10 reps Straight Leg Raises: AAROM;Right;5 reps Goniometric ROM: 5-80 (AAROM for flexion)    General Comments        Pertinent Vitals/Pain Pain Assessment: Faces Faces Pain Scale: Hurts little more Pain Location: R knee  Pain Descriptors / Indicators: Aching;Sore Pain Intervention(s): Monitored during session;Repositioned;Ice applied    Home Living                      Prior Function            PT Goals (current goals can now be found in the care plan section) Progress towards PT goals: Progressing toward goals    Frequency    7X/week      PT Plan Current plan remains appropriate    Co-evaluation              AM-PAC PT "6 Clicks" Daily Activity  Outcome Measure  Difficulty turning over in bed (including adjusting bedclothes, sheets  and blankets)?: A Lot Difficulty moving from lying on back to sitting on the side of the bed? : Total Difficulty sitting down on and standing up from a chair with arms (e.g., wheelchair, bedside commode, etc,.)?: Total Help needed moving to and from a bed to chair (including a wheelchair)?: A Little Help needed walking in hospital room?: A Little Help needed climbing 3-5 steps with a railing? : A Little 6 Click Score: 13    End of Session Equipment Utilized During Treatment: Gait belt Activity Tolerance: Patient tolerated treatment well Patient left: in chair;with call bell/phone within reach;with family/visitor present Nurse Communication: Mobility status PT Visit Diagnosis: Other abnormalities of gait and mobility (R26.89);Pain;Difficulty in walking, not elsewhere classified (R26.2) Pain - Right/Left:  Right Pain - part of body: Knee     Time: 9675-9163 PT Time Calculation (min) (ACUTE ONLY): 33 min  Charges:  $Gait Training: 8-22 mins $Therapeutic Exercise: 8-22 mins                    G Codes:       [name], PTA Pager: [phone]     Darliss Cheney 09/28/2016, 10:49 AM

## 2016-09-28 NOTE — Social Work (Signed)
Clinical Social Worker facilitated patient discharge including contacting patient family and facility to confirm patient discharge plans.  Clinical information faxed to facility and family agreeable with plan.    CSW arranged ambulance transport via [facility] to [facility]Pt going to Room 908.    RN to call [phone] to give report prior to discharge.  Clinical Social Worker will sign off for now as social work intervention is no longer needed. Please consult [place] again if new need arises.  Elissa Hefty, LCSW Clinical Social Worker [phone]

## 2016-09-28 NOTE — Clinical Social Work Placement (Signed)
   CLINICAL SOCIAL WORK PLACEMENT  NOTE  Date:  09/28/2016  Patient Details  Name: Jesus Oconnor MRN: [id number] Date of Birth: [birth year]  Clinical Social Work is seeking post-discharge placement for this patient at the [place] level of care (*CSW will initial, date and re-position this form in  chart as items are completed):  Yes   Patient/family provided with [place] Work Department's list of facilities offering this level of care within the geographic area requested by the patient (or if unable, by the patient's family).  Yes   Patient/family informed of their freedom to choose among providers that offer the needed level of care, that participate in Medicare, Medicaid or managed care program needed by the patient, have an available bed and are willing to accept the patient.  Yes   Patient/family informed of [facility]'s ownership interest in [facility] and [facility], as well as of the fact that they are under no obligation to receive care at these facilities.  PASRR submitted to EDS on       PASRR number received on 09/27/16     Existing PASRR number confirmed on 09/27/16     FL2 transmitted to all facilities in geographic area requested by pt/family on 09/27/16     FL2 transmitted to all facilities within larger geographic area on 09/27/16     Patient informed that his/her managed care company has contracts with or will negotiate with certain facilities, including the following:        Yes   Patient/family informed of bed offers received.  Patient chooses bed at [facility]     Physician recommends and patient chooses bed at      Patient to be transferred to [facility] on 09/28/16.  Patient to be transferred to facility by [facility]     Patient family notified on 09/28/16 of transfer.  Name of family member notified:     Daughter [name] at bedside   PHYSICIAN Please prepare priority discharge summary, including  medications, Please prepare prescriptions, Please sign FL2     Additional Comment:    _______________________________________________ Normajean Baxter, LCSW 09/28/2016, 11:07 AM

## 2016-09-28 NOTE — Care Management Note (Signed)
Case Management Note  Patient Details  Name: CLOYS VERA MRN: [id number] Date of Birth: 27-Oct-1933  Subjective/Objective:      Right TKA              Action/Plan: Discharge Planning: Chart reviewed. CSW following for SNF placement.   PCP Vernie Shanks MD  Expected Discharge Date:  09/28/16               Expected Discharge Plan:  [place]  In-House Referral:  Clinical Social Work  Discharge planning Services  CM Consult  Post Acute Care Choice:  Home Health Choice offered to:  NA  DME Arranged:  N/A DME Agency:  NA  HH Arranged:  PT [place] Agency:  [place]  Status of Service:  Completed, signed off  If discussed at [place] of Stay Meetings, dates discussed:    Additional Comments:  Erenest Rasher, RN 09/28/2016, 9:36 AM

## 2016-09-30 ENCOUNTER — Emergency Department (HOSPITAL_COMMUNITY)
Admission: EM | Admit: 2016-09-30 | Discharge: 2016-09-30 | Disposition: A | Discharge status: Home / Self Care | Payer: Medicare Other | Attending: Emergency Medicine | Admitting: Emergency Medicine

## 2016-09-30 ENCOUNTER — Encounter (HOSPITAL_COMMUNITY): Payer: Self-pay | Admitting: Emergency Medicine

## 2016-09-30 DIAGNOSIS — Z7982 Long term (current) use of aspirin: Secondary | ICD-10-CM | POA: Diagnosis not present

## 2016-09-30 DIAGNOSIS — Z87891 Personal history of nicotine dependence: Secondary | ICD-10-CM | POA: Diagnosis not present

## 2016-09-30 DIAGNOSIS — Z96651 Presence of right artificial knee joint: Secondary | ICD-10-CM | POA: Diagnosis not present

## 2016-09-30 DIAGNOSIS — J449 Chronic obstructive pulmonary disease, unspecified: Secondary | ICD-10-CM | POA: Insufficient documentation

## 2016-09-30 DIAGNOSIS — Z950 Presence of cardiac pacemaker: Secondary | ICD-10-CM | POA: Insufficient documentation

## 2016-09-30 DIAGNOSIS — R710 Precipitous drop in hematocrit: Secondary | ICD-10-CM | POA: Diagnosis present

## 2016-09-30 DIAGNOSIS — Z79899 Other long term (current) drug therapy: Secondary | ICD-10-CM | POA: Insufficient documentation

## 2016-09-30 DIAGNOSIS — Z7984 Long term (current) use of oral hypoglycemic drugs: Secondary | ICD-10-CM | POA: Diagnosis not present

## 2016-09-30 DIAGNOSIS — D62 Acute posthemorrhagic anemia: Secondary | ICD-10-CM | POA: Diagnosis not present

## 2016-09-30 DIAGNOSIS — I110 Hypertensive heart disease with heart failure: Secondary | ICD-10-CM | POA: Diagnosis not present

## 2016-09-30 DIAGNOSIS — I5022 Chronic systolic (congestive) heart failure: Secondary | ICD-10-CM | POA: Diagnosis not present

## 2016-09-30 DIAGNOSIS — E119 Type 2 diabetes mellitus without complications: Secondary | ICD-10-CM | POA: Insufficient documentation

## 2016-09-30 DIAGNOSIS — Z951 Presence of aortocoronary bypass graft: Secondary | ICD-10-CM | POA: Diagnosis not present

## 2016-09-30 DIAGNOSIS — I2510 Atherosclerotic heart disease of native coronary artery without angina pectoris: Secondary | ICD-10-CM | POA: Insufficient documentation

## 2016-09-30 LAB — BASIC METABOLIC PANEL
Anion gap: 11 (ref 5–15)
BUN: 43 mg/dL — ABNORMAL HIGH (ref 6–20)
CO2: 24 mmol/L (ref 22–32)
Calcium: 8.1 mg/dL — ABNORMAL LOW (ref 8.9–10.3)
Chloride: 102 mmol/L (ref 101–111)
Creatinine, Ser: 1.29 mg/dL — ABNORMAL HIGH (ref 0.61–1.24)
GFR calc Af Amer: 58 mL/min — ABNORMAL LOW (ref >60)
GFR calc non Af Amer: 50 mL/min — ABNORMAL LOW (ref >60)
Glucose, Bld: 197 mg/dL — ABNORMAL HIGH (ref 65–99)
Potassium: 4 mmol/L (ref 3.5–5.1)
Sodium: 137 mmol/L (ref 135–145)

## 2016-09-30 LAB — URINALYSIS, ROUTINE W REFLEX MICROSCOPIC
Bacteria, UA: NONE SEEN
Bilirubin Urine: NEGATIVE
GLUCOSE, UA: NEGATIVE mg/dL
Hgb urine dipstick: NEGATIVE
Ketones, ur: NEGATIVE mg/dL
Leukocytes, UA: NEGATIVE
NITRITE: NEGATIVE
PH: 5 (ref 5.0–8.0)
Protein, ur: 30 mg/dL — AB
Specific Gravity, Urine: 1.017 (ref 1.005–1.030)
Squamous Epithelial / LPF: NONE SEEN

## 2016-09-30 LAB — CBC WITH DIFFERENTIAL/PLATELET
Basophils Absolute: 0 10*3/uL (ref 0.0–0.1)
Basophils Relative: 0 %
Eosinophils Absolute: 0.1 10*3/uL (ref 0.0–0.7)
Eosinophils Relative: 2 %
HCT: 24 % — ABNORMAL LOW (ref 39.0–52.0)
Hemoglobin: 8.1 g/dL — ABNORMAL LOW (ref 13.0–17.0)
Lymphocytes Relative: 15 %
Lymphs Abs: 1.2 10*3/uL (ref 0.7–4.0)
MCH: 31.8 pg (ref 26.0–34.0)
MCHC: 33.8 g/dL (ref 30.0–36.0)
MCV: 94.1 fL (ref 78.0–100.0)
Monocytes Absolute: 0.9 10*3/uL (ref 0.1–1.0)
Monocytes Relative: 12 %
Neutro Abs: 5.4 10*3/uL (ref 1.7–7.7)
Neutrophils Relative %: 71 %
Platelets: 166 10*3/uL (ref 150–400)
RBC: 2.55 MIL/uL — ABNORMAL LOW (ref 4.22–5.81)
RDW: 13.6 % (ref 11.5–15.5)
WBC: 7.6 10*3/uL (ref 4.0–10.5)

## 2016-09-30 LAB — OCCULT BLOOD X 1 CARD TO LAB, STOOL: FECAL OCCULT BLD: NEGATIVE

## 2016-09-30 MED ORDER — SODIUM CHLORIDE 0.9 % IV BOLUS (SEPSIS)
500.0000 mL | Freq: Once | INTRAVENOUS | Status: AC
  Administered 2016-09-30: 500 mL via INTRAVENOUS

## 2016-09-30 NOTE — ED Triage Notes (Signed)
Per EMS, patient from [facility]. Patient had knee replacement on Monday, was sent there on Wednesday for rehab. Patient had routine blood work done today and was noted to have decreased hgb, hct, & plt count. Patient denies pain, denis sob, is A&Ox4, NAD. Will con't to monitor.

## 2016-09-30 NOTE — ED Notes (Signed)
[facility] was called for pt's transportation back to [facility].

## 2016-09-30 NOTE — ED Notes (Signed)
[facility] here to transport pt back to [facility].

## 2016-09-30 NOTE — ED Notes (Signed)
Staff at [facility] was called and given report on pt's condition and discharge--- discharge instructions were also provided.

## 2016-09-30 NOTE — Discharge Instructions (Signed)
The patient's hemoglobin was 8.1 today and will need to be rechecked next week.  Return here as needed.  Increase his fluid intake

## 2016-09-30 NOTE — ED Notes (Signed)
Patient denies pain and is resting comfortably.  

## 2016-09-30 NOTE — ED Notes (Signed)
Bed: QI29 Expected date:  Expected time:  Means of arrival:  Comments: 51 M from rehab low hgb, verify labs

## 2016-10-11 NOTE — ED Provider Notes (Signed)
[facility] DEPT Provider Note   CSN: [id number] Arrival date & time: 09/30/16  1802     History   Chief Complaint Chief Complaint  Patient presents with  . abnormal labs    hgb, hct, plt    HPI Jesus Oconnor is a 83 y.o. male.  HPI Patient presents to the emergency department with abnormal labs from his rehabilitation facility.  The patient had knee replacement surgery and had labs performed and they noted that his hemoglobin was low.  Patient was sent to the ER for confirmation of the low hemoglobin.  His hemoglobin did drop following surgery.  Patient states he has no complaints at this time. The patient denies chest pain, shortness of breath, headache,blurred vision, neck pain, fever, cough, weakness, numbness, dizziness, anorexia, edema, abdominal pain, nausea, vomiting, diarrhea, rash, back pain, dysuria, hematemesis, bloody stool, near syncope, or syncope. Past Medical History:  Diagnosis Date  . Anemia    history of  . Arthritis   . Atrial flutter (HCC)    severe bradycardia  . CHF (congestive heart failure) ([place])   . Constipation   . COPD (chronic obstructive pulmonary disease) ([place])   . Coronary artery disease   . Cough   . Diarrhea   . Diastolic dysfunction    Echo 04/07/2246-GNOIB I diastolic dysfunction,EF 70-48%  . DM2 (diabetes mellitus, type 2) ([place])   . Dyslipidemia   . History of kidney stones   . HTN (hypertension)   . Hyperlipidemia   . Leg swelling   . Mobitz type 1 second degree atrioventricular block   . Pacemaker 05/22/2012   Dr  Croitoru    Medtronic Adapta  . 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 SVG to firts & second diagonals,SVG to forth posterolateral branch to RCA  . Sore throat   . Wheezing     Patient Active Problem List   Diagnosis Date Noted  . Primary osteoarthritis of right knee 09/26/2016  . Localized osteoarthritis of right knee 09/16/2016    Class: Chronic  . S/P CABG x 5 11/27/2015  .  Chronic systolic CHF (congestive heart failure) ([place]) 09/09/2015  . CAD in native artery   . Other fatigue 08/19/2015  . Cardiomyopathy, ischemic 08/19/2015  . Essential hypertension 08/19/2015  . Atrial flutter, unspecified 08/19/2015  . NSVT (nonsustained ventricular tachycardia) ([place]) 08/05/2014  . PAT (paroxysmal atrial tachycardia) ([place]) 06/26/2013  . Chronic diastolic heart failure ([place]) 03/25/2013  . Pacemaker 05/23/2012  . CAD (coronary artery disease) 11/15/2010  . Dyslipidemia   . DM2 (diabetes mellitus, type 2) ([place])   . Atrial flutter [facility])     Past Surgical History:  Procedure Laterality Date  . CABG x5  09/13/2010   Dr Farrel Conners to LAD,SVG to obtuse marginal,seq. SVG to first & second diagonals,SVG to fourth posterolateral branch RCA  . CARDIAC CATHETERIZATION  09/10/2010   severe LAD,D1,D2 & ostial D2  . CARDIAC CATHETERIZATION N/A 08/28/2015   Procedure: Right/Left Heart Cath and Coronary/Graft Angiography;  Surgeon: Troy Sine, MD;  Location: [facility] CV LAB;  Service: Cardiovascular;  Laterality: N/A;  . CHOLECYSTECTOMY  october 2012  . CORONARY ARTERY BYPASS GRAFT    . INSERT / REPLACE / REMOVE PACEMAKER  05/22/2012    dual chamber  . PERMANENT PACEMAKER INSERTION N/A 05/22/2012   Procedure: PERMANENT PACEMAKER INSERTION;  Surgeon: Sanda Klein, MD;  Location: [facility] CATH LAB;  Service: Cardiovascular;  Laterality: N/A;  . repair of left indirect  inguinal hernia with mesh.    . repair of umbilical hernia.    Marland Kitchen TOTAL KNEE ARTHROPLASTY Right 09/26/2016   Procedure: TOTAL KNEE ARTHROPLASTY;  Surgeon: Frederik Pear, MD;  Location: [facility];  Service: Orthopedics;  Laterality: Right;       Home Medications    Prior to Admission medications   Medication Sig Start Date End Date Taking? Authorizing Provider  acetaminophen (TYLENOL) 325 MG tablet Take 650 mg by mouth every 4 (four) hours. Temp >99.5   Yes [provider]  acetaminophen (TYLENOL) 500 MG tablet  Take 1,000 mg by mouth every 8 (eight) hours as needed for moderate pain.   Yes [provider]  amLODipine (NORVASC) 10 MG tablet TAKE 1 TABLET BY MOUTH  DAILY Patient taking differently: TAKE 1 TABLET BY MOUTH DAILY IN THE EVENING 08/04/16  Yes Hilty, Nadean Corwin, MD  aspirin EC 325 MG tablet Take 1 tablet (325 mg total) by mouth 2 (two) times daily. 09/26/16  Yes Joanell Rising K, PA-C  carvedilol (COREG) 12.5 MG tablet Take 12.5 mg by mouth 2 (two) times daily with a meal.   Yes [provider]  furosemide (LASIX) 40 MG tablet TAKE 1 TABLET BY MOUTH  DAILY 08/04/16  Yes Croitoru, Mihai, MD  lidocaine (LIDODERM) 5 % Place 1 patch onto the skin every 12 (twelve) hours. Remove & Discard patch within 12 hours or as directed by MD   Yes [provider]  lisinopril (PRINIVIL,ZESTRIL) 20 MG tablet TAKE 1 TABLET BY MOUTH TWO  TIMES DAILY 08/04/16  Yes Hilty, Nadean Corwin, MD  metFORMIN (GLUCOPHAGE) 500 MG tablet Take 1,000 mg by mouth 2 (two) times daily with a meal.   Yes [provider]  metoprolol tartrate (LOPRESSOR) 25 MG tablet TAKE 1 TABLET BY MOUTH TWO  TIMES DAILY 08/04/16  Yes Croitoru, Mihai, MD  oxyCODONE-acetaminophen (ROXICET) 5-325 MG tablet Take 1 tablet by mouth every 4 (four) hours as needed. Patient taking differently: Take 1 tablet by mouth every 4 (four) hours as needed for moderate pain or severe pain.  09/26/16  Yes Joanell Rising K, PA-C  potassium chloride SA (K-DUR,KLOR-CON) 20 MEQ tablet TAKE 1 TABLET BY MOUTH DAILY 09/01/14  Yes Croitoru, Mihai, MD  senna (SENOKOT) 8.6 MG tablet Take 2 tablets by mouth at bedtime.   Yes [provider]  traMADol (ULTRAM) 50 MG tablet Take 25 mg by mouth 3 (three) times daily.   Yes [provider]  traZODone (DESYREL) 50 MG tablet Take 50 mg by mouth at bedtime as needed for sleep.    Yes [provider]  Cyanocobalamin (B-12) 1000 MCG/ML KIT Inject 1 Dose as directed every 30 (thirty) days.      [provider]  [facility] VERIO test strip daily. As directed 07/05/14   [provider]  tiZANidine (ZANAFLEX) 2 MG tablet Take 1 tablet (2 mg total) by mouth every 6 (six) hours as needed for muscle spasms. 09/26/16   Leighton Parody, PA-C    Family History Family History  Problem Relation Age of Onset  . Heart disease Father   . Heart disease Brother        s/p cabg  . Heart disease Maternal Uncle     Social History Social History  Substance Use Topics  . Smoking status: Former Smoker    Years: 40.00    Types: Cigarettes    Quit date: 08/06/2010  . Smokeless tobacco: Never Used  . Alcohol use Yes  Comment: 5-6 beers per week     Allergies   No known allergies   Review of Systems Review of Systems All other systems negative except as documented in the HPI. All pertinent positives and negatives as reviewed in the HPI.  Physical Exam Updated Vital Signs BP (!) 113/56   Pulse 65   Temp 98.6 F (37 C) (Oral)   Resp 18   Ht _0  (1.803 m)   Wt 94.8 kg (209 lb)   SpO2 98%   BMI 29.15 kg/m   Physical Exam  Constitutional: He is oriented to person, place, and time. He appears well-developed and well-nourished. No distress.  HENT:  Head: Normocephalic and atraumatic.  Mouth/Throat: Oropharynx is clear and moist.  Eyes: Pupils are equal, round, and reactive to light.  Neck: Normal range of motion. Neck supple.  Cardiovascular: Normal rate, regular rhythm and normal heart sounds.  Exam reveals no gallop and no friction rub.   No murmur heard. Pulmonary/Chest: Effort normal and breath sounds normal. No respiratory distress. He has no wheezes.  Neurological: He is alert and oriented to person, place, and time. He exhibits normal muscle tone. Coordination normal.  Skin: Skin is warm and dry. Capillary refill takes less than 2 seconds. No rash noted. No erythema.  Psychiatric: He has a normal mood and affect. His behavior is normal.  Nursing note  and vitals reviewed.    ED Treatments / Results  Labs (all labs ordered are listed, but only abnormal results are displayed) Labs Reviewed  BASIC METABOLIC PANEL - Abnormal; Notable for the following:       Result Value   Glucose, Bld 197 (*)    BUN 43 (*)    Creatinine, Ser 1.29 (*)    Calcium 8.1 (*)    GFR calc non Af Amer 50 (*)    GFR calc Af Amer 58 (*)    All other components within normal limits  CBC WITH DIFFERENTIAL/PLATELET - Abnormal; Notable for the following:    RBC 2.55 (*)    Hemoglobin 8.1 (*)    HCT 24.0 (*)    All other components within normal limits  URINALYSIS, ROUTINE W REFLEX MICROSCOPIC - Abnormal; Notable for the following:    Protein, ur 30 (*)    All other components within normal limits  OCCULT BLOOD X 1 CARD TO LAB, STOOL    EKG  EKG Interpretation None       Radiology No results found.  Procedures Procedures (including critical care time)  Medications Ordered in ED Medications  sodium chloride 0.9 % bolus 500 mL (0 mLs Intravenous Stopped 09/30/16 2159)     Initial Impression / Assessment and Plan / ED Course  I have reviewed the triage vital signs and the nursing notes.  Pertinent labs & imaging results that were available during my care of the patient were reviewed by me and considered in my medical decision making (see chart for details).     The patient's hemoglobin is slightly lower than his discharge, but fairly stable and is not at 6.3.  The patient will be discharged back to the facility told to return for any worsening in his condition.  Patient agrees to the plan  Final Clinical Impressions(s) / ED Diagnoses   Final diagnoses:  Acute posthemorrhagic anemia    New Prescriptions Discharge Medication List as of 09/30/2016 10:32 PM       Dalia Heading, PA-C 10/11/16 1725    Quintella Reichert, MD 10/14/16 [phone]

## 2016-10-12 ENCOUNTER — Encounter: Payer: Medicare Other | Admitting: *Deleted

## 2016-10-13 ENCOUNTER — Encounter: Payer: Self-pay | Admitting: Cardiology

## 2016-10-24 ENCOUNTER — Ambulatory Visit (INDEPENDENT_AMBULATORY_CARE_PROVIDER_SITE_OTHER): Payer: Medicare Other | Admitting: *Deleted

## 2016-10-24 DIAGNOSIS — I441 Atrioventricular block, second degree: Secondary | ICD-10-CM

## 2016-10-25 NOTE — Progress Notes (Signed)
Remote pacemaker transmission.   

## 2016-10-26 LAB — CUP PACEART REMOTE DEVICE CHECK
Brady Statistic AP VS Percent: 0 %
Brady Statistic AS VP Percent: 8 %
Brady Statistic AS VS Percent: 0 %
Implantable Lead Implant Date: 20140318
Implantable Lead Location: 753860
Implantable Lead Model: 5076
Lead Channel Impedance Value: 800 Ohm
Lead Channel Pacing Threshold Amplitude: 0.625 V
Lead Channel Pacing Threshold Amplitude: 0.875 V
Lead Channel Pacing Threshold Pulse Width: 0.4 ms
Lead Channel Pacing Threshold Pulse Width: 0.4 ms
Lead Channel Setting Pacing Amplitude: 2 V
MDC IDC LEAD IMPLANT DT: 20140318
MDC IDC LEAD LOCATION: 753859
MDC IDC MSMT BATTERY IMPEDANCE: 303 Ohm
MDC IDC MSMT BATTERY REMAINING LONGEVITY: 96 mo
MDC IDC MSMT BATTERY VOLTAGE: 2.79 V
MDC IDC MSMT LEADCHNL RA IMPEDANCE VALUE: 450 Ohm
MDC IDC PG IMPLANT DT: 20140318
MDC IDC SESS DTM: 20180820195028
MDC IDC SET LEADCHNL RV PACING AMPLITUDE: 2.5 V
MDC IDC SET LEADCHNL RV PACING PULSEWIDTH: 0.4 ms
MDC IDC SET LEADCHNL RV SENSING SENSITIVITY: 2.8 mV
MDC IDC STAT BRADY AP VP PERCENT: 92 %

## 2016-11-04 ENCOUNTER — Encounter: Payer: Self-pay | Admitting: Cardiology

## 2016-12-01 ENCOUNTER — Ambulatory Visit: Payer: Medicare Other | Admitting: Internal Medicine

## 2016-12-02 ENCOUNTER — Encounter: Payer: Self-pay | Admitting: Internal Medicine

## 2016-12-02 ENCOUNTER — Ambulatory Visit (INDEPENDENT_AMBULATORY_CARE_PROVIDER_SITE_OTHER): Payer: Medicare Other | Admitting: Internal Medicine

## 2016-12-02 VITALS — BP 120/60 | HR 81 | Ht 71.0 in | Wt 204.8 lb

## 2016-12-02 DIAGNOSIS — Z951 Presence of aortocoronary bypass graft: Secondary | ICD-10-CM | POA: Diagnosis not present

## 2016-12-02 DIAGNOSIS — I872 Venous insufficiency (chronic) (peripheral): Secondary | ICD-10-CM | POA: Diagnosis not present

## 2016-12-02 DIAGNOSIS — I10 Essential (primary) hypertension: Secondary | ICD-10-CM

## 2016-12-02 DIAGNOSIS — Z950 Presence of cardiac pacemaker: Secondary | ICD-10-CM

## 2016-12-02 NOTE — Patient Instructions (Addendum)
Total and permament disability parking placard form completed/signed by MD - cannot walk 265ft without stopping to rest, cannot walk without the use of, or assistance from, a brace, cane, crutch, another person, prosthetic device, wheelchair, or other assistive device.    Dr. Debara Pickett recommends Knee HIGH COMPRESSION STOCKINGS for right leg -- 20-30 mmHg (compression strength) -- [facility]  -- [address]  -- [place]. [place]   -- [phone]  How to Use Compression Stockings Compression stockings are elastic socks that squeeze the legs. They help to increase blood flow to the legs, decrease swelling in the legs, and reduce the chance of developing blood clots in the lower legs. Compression stockings are often used by people who:  Are recovering from surgery.  Have poor circulation in their legs.  Are prone to getting blood clots in their legs.  Have varicose veins.  Sit or stay in bed for long periods of time. How to use compression stockings Before you put on your compression stockings:  Make sure that they are the correct size. If you do not know your size, ask your health care provider.  Make sure that they are clean, dry, and in good condition.  Check them for rips and tears. Do not put them on if they are ripped or torn. Put your stockings on first thing in the morning, before you get out of bed. Keep them on for as long as your health care provider advises. When you are wearing your stockings:  Keep them as smooth as possible. Do not allow them to bunch up. It is especially important to prevent the stockings from bunching up around your toes or behind your knees.  Do not roll the stockings downward and leave them rolled down. This can decrease blood flow to your leg.  Change them right away if they become wet or dirty. When you take off your stockings, inspect your legs and feet.  Anything that does not seem normal may require medical attention. Look for:  Open sores.  Red spots.  Swelling. Information and tips  Do not stop wearing your compression stockings without talking to your health care provider first.  Wash your stockings every day with mild detergent in cold or warm water. Do not use bleach. Air-dry your stockings or dry them in a clothes dryer on low heat.  Replace your stockings every 3-6 months.  If skin moisturizing is part of your treatment plan, apply lotion or cream at night so that your skin will be dry when you put on the stockings in the morning. It is harder to put the stockings on when you have lotion on your legs or feet. Contact a health care provider if: Remove your stockings and seek medical care if:  You have a feeling of pins and needles in your feet or legs.  You have any new changes in your skin.  You have skin lesions that are getting worse.  You have swelling or pain that is getting worse. Get help right away if:  You have numbness or tingling in your lower legs that does not get better right after you take the stockings off.  Your toes or feet become cold and blue.  You develop open sores or red spots on your legs that do not go away.  You see or feel a warm spot on your leg.  You have new swelling or soreness in your leg.  You are short of breath or you have chest pain for no reason.  You have a rapid or irregular heartbeat.  You feel light-headed or dizzy. This information is not intended to replace advice given to you by your health care provider. Make sure you discuss any questions you have with your health care provider. Document Released: 12/19/2008 Document Revised: 07/22/2015 Document Reviewed: 01/29/2014 Elsevier Interactive Patient Education  2017 [place] physician wants you to follow-up in: 6 months with Dr. Debara Pickett. You will receive a reminder letter in the mail two months in advance.  If you don't receive a letter, please call our office to schedule the follow-up appointment.

## 2016-12-02 NOTE — Progress Notes (Signed)
OFFICE NOTE  Chief Complaint:  Doing well after knee surgery  Primary Care Physician: Vernie Shanks, MD  HPI:  Jesus Oconnor is a 83 year old gentleman who we have been following closely with a history of 5-vessel CABG and atrial flutter in 2012. He has done well with lack of chest pain; however, he says he does have low energy which has been pretty much stable, not necessarily improved after surgery. He also has significant abnormalities in his EKG as well indicating a conduction delay. When his episode of atrial flutter was occurring his ventricular response was only in the 40s. He was also noted on EKG to have trifascicular block and periods of Wenckebach. Today, he actually had some more Wenckebach and couplets as well as continued trifascicular block. He has had paroxysmal A-flutter and we have kept him off a beta blocker for those reasons. I placed him on a monitor which demonstrated significant bradycardia for which she was symptomatic and ultimately underwent a permanent pacemaker. He reports that this has helped him significantly and recently followed up with Dr. Loletha Grayer in the office. The pacemaker was interrogated and apparently is working properly. [name] has no significant complaints other than some right knee pain and itchiness and tenderness along the lower aspect of his midline sternal incision, where he has a keloid scar.  Of note he recently had acute cholecystitis and is status post cholecystectomy.  [name] returns for followup today. He saw Dr. Loletha Grayer last week for pacemaker check and it seems to be working appropriately. He has had some problems with heart failure. He had fluid gain and it turns out that he was not taking his Lasix on a scheduled basis. He since restarted taking that every day and is feeling much better. He has no complaints of shortness of breath or other symptoms.  08/19/2015  [name] was seen today back in the office for follow-up. I had last  seen him in 2015 and he was recently seen by Dr. Loletha Grayer in the office. This was for device check at which time he was noted to have some worsening fatigue. He is also noted to have some leg swelling. An echocardiogram was performed and unfortunately shows a marked reduction in LVEF to 30-35%. The ventricle was noted to be mildly dilated with diffuse hypokinesis and grade 2 diastolic dysfunction. He is currently on Lasix 40 mg daily and reports his weight is been fairly stable. He denies any orthopnea, PND or significant worsening of dyspnea on exertion. He has reported however fatigue and also denies any chest pain. Blood pressure is elevated today 170/80. When discussing about possible causes of his cardiomyopathy, he does report that he is using at least a moderate amount of alcohol and drinks 3-4 beers at least 3 times a week.  09/09/2015  [name] returns to follow-up his recent heart catheterization. This demonstrated the following:   Mild global LV dysfunction with an ejection fraction of 45-50%. There is significant mitral annular calcification and trace mitral regurgitation.  Mild pulmonary hypertension.  Significant native CAD with 20% mid left main narrowing; diffuse 60-70% proximal and 70% mid LAD stenoses; total occlusion of the proximal circumflex coronary artery with faint antegrade collaterals; and diffusely irregular RCA with mild calcification without apparent high-grade stenosis.  Patent LIMA graft supplying the mid LAD.  Patent sequential vein graft supplying the diagonal 1 and diagonal 2 vessel with mild 30% narrowing in the region of a valve in the proximal  portion of the graft.  Patent vein graft supplying the distal circumflex marginal vessel with smooth 40% narrowing proximally in the region of a valve.  Patent vein graft supplying the distal posterolateral branch of the RCA.  Based on these findings medical therapy was recommended. His filling pressures seem to be  well-controlled. EF was interestingly reported much higher on this study than it was on his echocardiogram. I had recently increased his lisinopril to 20 mg twice a day. We discussed management options today and I feel that he could use better blood pressure control. In addition he recently stopped alcohol completely check commended him on.  11/27/2015  [name] returns today for follow-up. After some medication adjustments his blood pressure is now much better controlled 120/70. He denies any chest pain or worsening shortness of breath. We've been continuing to up titrate his medications for heart failure. He's also completely stopped alcohol use. He is due for a remote pacemaker check which is coming up fairly soon. His pacemaker is now been in place for about 4 years. It seems to be AV pacing today.  06/03/2016  [name] is seen today in follow-up. Overall he seems to be doing well. Weight is down about 5 pounds since I last saw him. Is no evidence for heart failure. His weight loss may be attributable to decreased appetite. Blood pressure is good at 124/70. EKG shows a paced rhythm at 75. He says he's had some episodes of palpitations mostly associated with alcohol use which he has significantly reduced if not stopped. He is reporting some bilateral lower extremity swelling. This seems to be worse when sitting for long periods of time and tends to go away almost completely when elevating his feet at night.  12/02/2016  [name] returns today for follow-up. In July he underwent right total knee replacement. He had 3 weeks of rehabilitation which she said went well for the right knee however they work the left knee a lot and he had developed some inflammatory arthritis requiring drainage. He says that he's overall walking better. He denies any worsening shortness of breath or heart failure symptoms. Blood pressure is well-controlled today. His pacemaker was assessed this past spring by Dr. Salena Saner  and is working appropriately with adequate battery life. He is reporting some right lower extremity swelling in the area where his right knee replacement was. There is some evidence of venous insufficiency.  PMHx:  Past Medical History:  Diagnosis Date  . Anemia    history of  . Arthritis   . Atrial flutter (HCC)    severe bradycardia  . CHF (congestive heart failure) (HCC)   . Constipation   . COPD (chronic obstructive pulmonary disease) (HCC)   . Coronary artery disease   . Cough   . Diarrhea   . Diastolic dysfunction    Echo 09/11/2010-grade I diastolic dysfunction,EF 55-60%  . DM2 (diabetes mellitus, type 2) (HCC)   . Dyslipidemia   . History of kidney stones   . HTN (hypertension)   . Hyperlipidemia   . Leg swelling   . Mobitz type 1 second degree atrioventricular block   . Pacemaker 05/22/2012   Dr  Croitoru    Medtronic Adapta  . 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 SVG to firts & second diagonals,SVG to forth posterolateral branch to RCA  . Sore throat   . Wheezing     Past Surgical History:  Procedure Laterality Date  . CABG  x5  09/13/2010   Dr Farrel Conners to LAD,SVG to obtuse marginal,seq. SVG to first & second diagonals,SVG to fourth posterolateral branch RCA  . CARDIAC CATHETERIZATION  09/10/2010   severe LAD,D1,D2 & ostial D2  . CARDIAC CATHETERIZATION N/A 08/28/2015   Procedure: Right/Left Heart Cath and Coronary/Graft Angiography;  Surgeon: Troy Sine, MD;  Location: [facility];  Service: Cardiovascular;  Laterality: N/A;  . CHOLECYSTECTOMY  october 2012  . CORONARY ARTERY BYPASS GRAFT    . INSERT / REPLACE / REMOVE PACEMAKER  05/22/2012    dual chamber  . PERMANENT PACEMAKER INSERTION N/A 05/22/2012   Procedure: PERMANENT PACEMAKER INSERTION;  Surgeon: Sanda Klein, MD;  Location: [facility];  Service: Cardiovascular;  Laterality: N/A;  . repair of left indirect inguinal hernia with mesh.    . repair of umbilical hernia.     Marland Kitchen TOTAL KNEE ARTHROPLASTY Right 09/26/2016   Procedure: TOTAL KNEE ARTHROPLASTY;  Surgeon: Frederik Pear, MD;  Location: [facility];  Service: Orthopedics;  Laterality: Right;    FAMHx:  Family History  Problem Relation Age of Onset  . Heart disease Father   . Heart disease Brother        s/p cabg  . Heart disease Maternal Uncle     SOCHx:   reports that he quit smoking about 6 years ago. His smoking use included Cigarettes. He quit after 40.00 years of use. He has never used smokeless tobacco. He reports that he drinks alcohol. He reports that he uses drugs, including Marijuana.  ALLERGIES:  Allergies  Allergen Reactions  . No Known Allergies     ROS: Pertinent items noted in HPI and remainder of comprehensive ROS otherwise negative.  HOME MEDS: Current Outpatient Prescriptions  Medication Sig Dispense Refill  . amLODipine (NORVASC) 10 MG tablet TAKE 1 TABLET BY MOUTH  DAILY (Patient taking differently: TAKE 1 TABLET BY MOUTH DAILY IN THE EVENING) 90 tablet 3  . aspirin EC 325 MG tablet Take 1 tablet (325 mg total) by mouth 2 (two) times daily. 30 tablet 0  . carvedilol (COREG) 12.5 MG tablet Take 12.5 mg by mouth 2 (two) times daily with a meal.    . Cyanocobalamin (B-12) 1000 MCG/ML KIT Inject 1 Dose as directed every 30 (thirty) days.     Marland Kitchen ezetimibe (ZETIA) 10 MG tablet Take 10 mg by mouth daily.    . furosemide (LASIX) 40 MG tablet TAKE 1 TABLET BY MOUTH  DAILY 90 tablet 2  . lidocaine (LIDODERM) 5 % Place 1 patch onto the skin every 12 (twelve) hours. Remove & Discard patch within 12 hours or as directed by MD    . lisinopril (PRINIVIL,ZESTRIL) 20 MG tablet TAKE 1 TABLET BY MOUTH TWO  TIMES DAILY 180 tablet 3  . metFORMIN (GLUCOPHAGE) 500 MG tablet Take 1,000 mg by mouth 2 (two) times daily with a meal.    . metoprolol tartrate (LOPRESSOR) 25 MG tablet TAKE 1 TABLET BY MOUTH TWO  TIMES DAILY 180 tablet 2  . ONETOUCH VERIO test strip daily. As directed  4  . potassium  chloride SA (K-DUR,KLOR-CON) 20 MEQ tablet TAKE 1 TABLET BY MOUTH DAILY 30 tablet 10  . traMADol (ULTRAM) 50 MG tablet Take 25 mg by mouth 3 (three) times daily.    . traZODone (DESYREL) 50 MG tablet Take 50 mg by mouth at bedtime as needed for sleep.      No current facility-administered medications for this visit.     LABS/IMAGING: No  results found for this or any previous visit (from the past 22 hour(s)). No results found.  VITALS: BP 120/60   Pulse 81   Ht '5\' 11"'$  (1.803 m)   Wt 204 lb 12.8 oz (92.9 kg)   BMI 28.56 kg/m   EXAM: General appearance: alert and no distress Neck: no carotid bruit and no JVD Lungs: clear to auscultation bilaterally Heart: regular rate and rhythm, S1, S2 normal, no murmur, click, rub or gallop Abdomen: soft, non-tender; bowel sounds normal; no masses,  no organomegaly Extremities: Status post right TKR, 2+ right lower extremity stasis edema Pulses: 2+ and symmetric Skin: Skin color, texture, turgor normal. No rashes or lesions Neurologic: Grossly normal Psych: Pleasant  EKG: AV paced rhythm at 81-personally reviewed  ASSESSMENT: 1. Status post right TKR with venous stasis edema 2. Cardiomyopathy with newly reduced EF to 30-35% - no new obstructive disease by cath, EF 45-50% 3. 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 Short episode of paroxysmal atrial fibrillation in the setting of acute cholecystitis, without significant recurrence on aspirin - recent short bursts of atrial tachy 5. Hypertension-controlled 6. Dyslipidemia-at goal 7. CABG x 5 vessels (2012) 8. Recent moderate alcohol abuse-now abstinent 9. Dependent edema  PLAN:  1.   Mr. Nong is doing well after right total knee replacement. He says his pain levels improved and is ambulating better. He does have some swelling of the right lower extremity would benefit from continued diuretics and a  compression stocking. He's asked for handicap parking sticker today which I provided. He still gets short of breath with exertion however more with marked exertion consistent with NYHA class II symptoms. EF was most recently 45-50%. He denies any recurrent chest pain. Blood pressure is well-controlled. Plan follow-up in 6 months or sooner as necessary.  Pixie Casino, MD, [facility] Attending Cardiologist Graham C Hilty 12/02/2016, 9:11 AM

## 2017-01-23 ENCOUNTER — Ambulatory Visit (INDEPENDENT_AMBULATORY_CARE_PROVIDER_SITE_OTHER): Payer: Medicare Other | Admitting: *Deleted

## 2017-01-23 DIAGNOSIS — I441 Atrioventricular block, second degree: Secondary | ICD-10-CM

## 2017-01-23 NOTE — Progress Notes (Signed)
Remote pacemaker transmission.   

## 2017-01-24 LAB — CUP PACEART REMOTE DEVICE CHECK
Battery Impedance: 376 Ohm
Battery Remaining Longevity: 90 mo
Brady Statistic AP VP Percent: 95 %
Brady Statistic AP VS Percent: 0 %
Brady Statistic AS VS Percent: 0 %
Implantable Lead Implant Date: 20140318
Implantable Lead Implant Date: 20140318
Implantable Lead Location: 753859
Lead Channel Impedance Value: 469 Ohm
Lead Channel Setting Pacing Amplitude: 2 V
Lead Channel Setting Pacing Amplitude: 2.5 V
Lead Channel Setting Pacing Pulse Width: 0.4 ms
Lead Channel Setting Sensing Sensitivity: 2.8 mV
MDC IDC LEAD LOCATION: 753860
MDC IDC MSMT BATTERY VOLTAGE: 2.78 V
MDC IDC MSMT LEADCHNL RA PACING THRESHOLD AMPLITUDE: 0.875 V
MDC IDC MSMT LEADCHNL RA PACING THRESHOLD PULSEWIDTH: 0.4 ms
MDC IDC MSMT LEADCHNL RV IMPEDANCE VALUE: 818 Ohm
MDC IDC MSMT LEADCHNL RV PACING THRESHOLD AMPLITUDE: 0.625 V
MDC IDC MSMT LEADCHNL RV PACING THRESHOLD PULSEWIDTH: 0.4 ms
MDC IDC PG IMPLANT DT: 20140318
MDC IDC SESS DTM: 20181119150135
MDC IDC STAT BRADY AS VP PERCENT: 5 %

## 2017-02-02 ENCOUNTER — Encounter: Payer: Self-pay | Admitting: Cardiology

## 2017-04-24 ENCOUNTER — Ambulatory Visit (INDEPENDENT_AMBULATORY_CARE_PROVIDER_SITE_OTHER): Payer: Medicare Other | Admitting: *Deleted

## 2017-04-24 DIAGNOSIS — I441 Atrioventricular block, second degree: Secondary | ICD-10-CM | POA: Diagnosis not present

## 2017-04-24 NOTE — Progress Notes (Signed)
Remote pacemaker transmission.   

## 2017-04-25 LAB — CUP PACEART REMOTE DEVICE CHECK
Battery Impedance: 352 Ohm
Battery Remaining Longevity: 91 mo
Battery Voltage: 2.78 V
Brady Statistic AP VS Percent: 0 %
Brady Statistic AS VP Percent: 4 %
Brady Statistic AS VS Percent: 0 %
Date Time Interrogation Session: 20190218142906
Implantable Lead Implant Date: 20140318
Implantable Lead Location: 753859
Implantable Lead Location: 753860
Implantable Lead Model: 5076
Implantable Lead Model: 5076
Lead Channel Impedance Value: 776 Ohm
Lead Channel Pacing Threshold Amplitude: 0.75 V
Lead Channel Pacing Threshold Pulse Width: 0.4 ms
Lead Channel Pacing Threshold Pulse Width: 0.4 ms
Lead Channel Setting Pacing Amplitude: 2 V
Lead Channel Setting Pacing Amplitude: 2.5 V
Lead Channel Setting Pacing Pulse Width: 0.4 ms
MDC IDC LEAD IMPLANT DT: 20140318
MDC IDC MSMT LEADCHNL RA IMPEDANCE VALUE: 456 Ohm
MDC IDC MSMT LEADCHNL RV PACING THRESHOLD AMPLITUDE: 0.625 V
MDC IDC PG IMPLANT DT: 20140318
MDC IDC SET LEADCHNL RV SENSING SENSITIVITY: 2.8 mV
MDC IDC STAT BRADY AP VP PERCENT: 96 %

## 2017-04-26 ENCOUNTER — Encounter: Payer: Self-pay | Admitting: Cardiology

## 2017-05-01 ENCOUNTER — Other Ambulatory Visit: Payer: Self-pay | Admitting: Cardiovascular Disease

## 2017-05-22 ENCOUNTER — Other Ambulatory Visit: Payer: Self-pay | Admitting: *Deleted

## 2017-05-22 MED ORDER — METOPROLOL TARTRATE 25 MG PO TABS: 25.0000 mg | ORAL_TABLET | Freq: Two times a day (BID) | ORAL | 1 refills | Status: DC

## 2017-07-03 ENCOUNTER — Other Ambulatory Visit: Payer: Self-pay | Admitting: Internal Medicine

## 2017-07-03 NOTE — Telephone Encounter (Signed)
REFILL 

## 2017-07-07 ENCOUNTER — Other Ambulatory Visit: Payer: Self-pay

## 2017-07-07 MED ORDER — CARVEDILOL 12.5 MG PO TABS: 12.5000 mg | ORAL_TABLET | Freq: Two times a day (BID) | ORAL | 3 refills | Status: DC

## 2017-07-13 ENCOUNTER — Telehealth: Payer: Self-pay | Admitting: Internal Medicine

## 2017-07-13 NOTE — Telephone Encounter (Signed)
New message    Pt c/o medication issue:  1. Name of Medication: carvedilol (COREG) 12.5 MG tablet and metoprolol tartrate (LOPRESSOR) 25 MG tablet  2. How are you currently taking this medication (dosage and times per day)? As prescribed  3. Are you having a reaction (difficulty breathing--STAT)? no  4. What is your medication issue? [employer] calling to confirm patient is okay to take both medications. Please call [phone], use ref #[id number] to confirm.

## 2017-07-13 NOTE — Telephone Encounter (Signed)
Called pt to verify what medication he is currently taking. Pt states he is unsure and that he daughter manages his medication. Pt states he will have his daughter call office.

## 2017-07-13 NOTE — Telephone Encounter (Signed)
Spoke with Pt's daughter to confirm whether her father is currently taking Coreg 12.5 or Metoprolol 25 mg. Daughter states the only bottle he has is the metoprolol and was unsure. Daughter also states she think her father has been taking lasix 40 mg twice a day instead of once. Routing to Dr. Debara Pickett on clarification as to what the pt should be taking.

## 2017-07-14 NOTE — Telephone Encounter (Signed)
Returned call to patient's daughter [name] no answer.LMTC. 

## 2017-07-14 NOTE — Telephone Encounter (Signed)
He should be on carvedilol. Was taking lasix 40 mg daily - ok to take twice daily if that is working for him.   Dr. Lemmie Evens

## 2017-07-19 ENCOUNTER — Ambulatory Visit: Payer: Medicare Other | Admitting: Cardiovascular Disease

## 2017-07-19 ENCOUNTER — Encounter: Payer: Self-pay | Admitting: Cardiovascular Disease

## 2017-07-19 VITALS — BP 120/68 | HR 69 | Ht 71.5 in | Wt 218.8 lb

## 2017-07-19 DIAGNOSIS — I5022 Chronic systolic (congestive) heart failure: Secondary | ICD-10-CM

## 2017-07-19 DIAGNOSIS — I2510 Atherosclerotic heart disease of native coronary artery without angina pectoris: Secondary | ICD-10-CM | POA: Diagnosis not present

## 2017-07-19 DIAGNOSIS — I10 Essential (primary) hypertension: Secondary | ICD-10-CM

## 2017-07-19 DIAGNOSIS — Z950 Presence of cardiac pacemaker: Secondary | ICD-10-CM

## 2017-07-19 DIAGNOSIS — I442 Atrioventricular block, complete: Secondary | ICD-10-CM

## 2017-07-19 DIAGNOSIS — J449 Chronic obstructive pulmonary disease, unspecified: Secondary | ICD-10-CM

## 2017-07-19 DIAGNOSIS — I471 Supraventricular tachycardia: Secondary | ICD-10-CM

## 2017-07-19 DIAGNOSIS — E785 Hyperlipidemia, unspecified: Secondary | ICD-10-CM

## 2017-07-19 DIAGNOSIS — I4719 Other supraventricular tachycardia: Secondary | ICD-10-CM

## 2017-07-19 NOTE — Patient Instructions (Signed)
Dr Sallyanne Kuster recommends that you continue on your current medications as directed. Please refer to the Current Medication list given to you today.  Remote monitoring is used to monitor your Pacemaker or ICD from home. This monitoring reduces the number of office visits required to check your device to one time per year. It allows [place] to keep an eye on the functioning of your device to ensure it is working properly. You are scheduled for a device check from home on Monday, May 20th, 2019. You may send your transmission at any time that day. If you have a wireless device, the transmission will be sent automatically. After your physician reviews your transmission, you will receive a notification with your next transmission date.  To improve our patient care and to more adequately follow your device, [facility] has decided, as a practice, to start following each patient four times a year with your home monitor. This means that you may experience a remote appointment that is close to an in-office appointment with your physician. Your insurance will apply at the same rate as other remote monitoring transmissions.  Dr Sallyanne Kuster recommends that you schedule a follow-up appointment in 12 months with a pacemaker check. You will receive a reminder letter in the mail two months in advance. If you don't receive a letter, please call our office to schedule the follow-up appointment.  If you need a refill on your cardiac medications before your next appointment, please call your pharmacy.

## 2017-07-19 NOTE — Progress Notes (Signed)
Patient ID: Jesus Oconnor, male   DOB: 1933/03/16, 84 y.o.   MRN: [id number]     Cardiology Office Note    Date:  07/19/2017   ID:  Jesus Oconnor, DOB 10/30/1933, MRN [id number]  PCP:  Vernie Shanks, MD  Cardiologist:   Sanda Klein, MD   Chief Complaint  Patient presents with  . Follow-up    CHF, CAD, AV block, pacemaker     History of Present Illness:  Jesus Oconnor is a 84 y.o. 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 He has had repeated problems with paroxysmal atrial tachycardia (possible brief flutter) and his pacemaker has recorded occasional nonsustained ventricular tachycardia.  He complains of fatigue. he does not have problems with angina or dyspnea at rest or with activity and has not had syncope or falls.  He denies palpitations.  He has occasional mild calf edema.  He had worsening wheezing which has improved after treatment with Breo.  He is no longer taking carvedilol but is on metoprolol.  His daughter inquires whether he should be taking both pravastatin and Zetia.  His last lipid profile from his primary care provider shows an LDL cholesterol 78 which is borderline acceptable.  His daughter believes that he may have been on a more potent statin in the past but had side effects.  He had right total knee replacement for severe arthritis.  This is also the site of his saphenous vein harvest for his previous bypass. The right calf swells a little more than the left.  He does not have orthopnea or PND.  In 2017, he developed edema and shortness of breath and echocardiography showed an EF had dropped to 30-35 %. On 08/28/2015 he underwent right and left heart catheterization. Estimated LVEF was  better at 45-50 %. He had severe native coronary disease, but all his bypasses (LIMA to LAD, SVG sequential diagonal 1-diagonal 2, SVG to distal circumflex, SVG to PLA branch of RCA) were widely patent. Medical therapy was recommended.  He saw Dr. Debara Pickett in September 2018 and seemed to be well compensated from a heart failure point of view.  Normal pacemaker check performed today. This showed battery longevity is estimated at 7 years, 97% atrial pacing, 99.6% ventricular pacing. No recent episodes of atrial mode switch or ventricular tachycardia. Last significant episode of PAT was in July 2018, 12 minutes at 150 bpm.  The heart rate histogram distribution, virtually 100% AV sequential paced, is acceptable for his level of activity.  There is no detectable atrial activity today.  He does have an idioventricular escape rhythm today at about 30-35 bpm, but at previous appointments he did not have an escape rhythm when ventricular pacing is taken down to 30 bpm. He seems to have progressed to complete heart block.  It is best to consider him pacemaker dependent.  Past Medical History:  Diagnosis Date  . Anemia    history of  . Arthritis   . Atrial flutter (HCC)    severe bradycardia  . CHF (congestive heart failure) ([place])   . Constipation   . COPD (chronic obstructive pulmonary disease) ([place])   . Coronary artery disease   . Cough   . Diarrhea   . Diastolic dysfunction    Echo 03/14/4079-KGYJE I diastolic dysfunction,EF 56-31%  .  DM2 (diabetes mellitus, type 2) ([place])   . Dyslipidemia   . History of kidney stones   . HTN (hypertension)   . Hyperlipidemia   . Leg swelling   . Mobitz type 1 second degree atrioventricular block   . Pacemaker 05/22/2012   Dr  Casondra Gasca    Medtronic Adapta  . 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 SVG to firts & second diagonals,SVG to forth posterolateral branch to RCA  . Sore throat   . Wheezing     Past Surgical History:  Procedure  Laterality Date  . CABG x5  09/13/2010   Dr Farrel Conners to LAD,SVG to obtuse marginal,seq. SVG to first & second diagonals,SVG to fourth posterolateral branch RCA  . CARDIAC CATHETERIZATION  09/10/2010   severe LAD,D1,D2 & ostial D2  . CARDIAC CATHETERIZATION N/A 08/28/2015   Procedure: Right/Left Heart Cath and Coronary/Graft Angiography;  Surgeon: Troy Sine, MD;  Location: [facility];  Service: Cardiovascular;  Laterality: N/A;  . CHOLECYSTECTOMY  october 2012  . CORONARY ARTERY BYPASS GRAFT    . INSERT / REPLACE / REMOVE PACEMAKER  05/22/2012    dual chamber  . PERMANENT PACEMAKER INSERTION N/A 05/22/2012   Procedure: PERMANENT PACEMAKER INSERTION;  Surgeon: Sanda Klein, MD;  Location: [facility];  Service: Cardiovascular;  Laterality: N/A;  . repair of left indirect inguinal hernia with mesh.    . repair of umbilical hernia.    Marland Kitchen TOTAL KNEE ARTHROPLASTY Right 09/26/2016   Procedure: TOTAL KNEE ARTHROPLASTY;  Surgeon: Frederik Pear, MD;  Location: [facility];  Service: Orthopedics;  Laterality: Right;    Current Medications: Outpatient Medications Prior to Visit  Medication Sig Dispense Refill  . amLODipine (NORVASC) 10 MG tablet Take 1 tablet (10 mg total) by mouth daily. KEEP OV. 90 tablet 0  . aspirin EC 325 MG tablet Take 1 tablet (325 mg total) by mouth 2 (two) times daily. 30 tablet 0  . Cyanocobalamin (B-12) 1000 MCG/ML KIT Inject 1 Dose as directed every 30 (thirty) days.     Marland Kitchen ezetimibe (ZETIA) 10 MG tablet Take 10 mg by mouth daily.    . fluticasone furoate-vilanterol (BREO ELLIPTA) 200-25 MCG/INH AEPB Inhale 1 puff into the lungs 2 (two) times daily.    . furosemide (LASIX) 40 MG tablet TAKE 1 TABLET BY MOUTH  DAILY 90 tablet 2  . lisinopril (PRINIVIL,ZESTRIL) 20 MG tablet Take 1 tablet (20 mg total) by mouth 2 (two) times daily. KEEP OV. 180 tablet 0  . metFORMIN (GLUCOPHAGE) 500 MG tablet Take 1,000 mg by mouth 2 (two) times daily with a meal.    . metoprolol tartrate  (LOPRESSOR) 25 MG tablet Take 1 tablet (25 mg total) by mouth 2 (two) times daily. 180 tablet 1  . ONETOUCH VERIO test strip daily. As directed  4  . potassium chloride SA (K-DUR,KLOR-CON) 20 MEQ tablet TAKE 1 TABLET BY MOUTH DAILY 30 tablet 10  . traZODone (DESYREL) 50 MG tablet Take 50 mg by mouth at bedtime as needed for sleep.     . carvedilol (COREG) 12.5 MG tablet Take 1 tablet (12.5 mg total) by mouth 2 (two) times daily with a meal. 180 tablet 3  . lidocaine (LIDODERM) 5 % Place 1 patch onto the skin every 12 (twelve) hours. Remove & Discard patch within 12 hours or as directed by MD    . traMADol (ULTRAM) 50 MG tablet Take 25 mg by mouth 3 (three) times  daily.     No facility-administered medications prior to visit.      Allergies:   No known allergies   Social History   Socioeconomic History  . Marital status: Widowed    Spouse name: Not on file  . Number of children: 2  . Years of education: Not on file  . Highest education level: Not on file  Occupational History  . Not on file  Social Needs  . Financial resource strain: Not on file  . Food insecurity:    Worry: Not on file    Inability: Not on file  . Transportation needs:    Medical: Not on file    Non-medical: Not on file  Tobacco Use  . Smoking status: Former Smoker    Years: 40.00    Types: Cigarettes    Last attempt to quit: 08/06/2010    Years since quitting: 6.9  . Smokeless tobacco: Never Used  Substance and Sexual Activity  . Alcohol use: Yes    Comment: 5-6 beers per week  . Drug use: Yes    Types: Marijuana    Comment: couple times a month  . Sexual activity: Not on file  Lifestyle  . Physical activity:    Days per week: Not on file    Minutes per session: Not on file  . Stress: Not on file  Relationships  . Social connections:    Talks on phone: Not on file    Gets together: Not on file    Attends religious service: Not on file    Active member of club or organization: Not on file     Attends meetings of clubs or organizations: Not on file    Relationship status: Not on file  Other Topics Concern  . Not on file  Social History Narrative  . Not on file     Family History:  The patient's family history includes Heart disease in his brother, father, and maternal uncle.   ROS:   Please see the history of present illness.    ROS All other systems reviewed and are negative.   PHYSICAL EXAM:   VS:  BP 120/68   Pulse 69   Ht 5' 11.5" (1.816 m)   Wt 218 lb 12.8 oz (99.2 kg)   BMI 30.09 kg/m      General: Alert, oriented x3, no distress, borderline obese Head: no evidence of trauma, PERRL, EOMI, no exophtalmos or lid lag, no myxedema, no xanthelasma; normal ears, nose and oropharynx Neck: normal jugular venous pulsations and no hepatojugular reflux; brisk carotid pulses without delay and no carotid bruits Chest: clear to auscultation, no signs of consolidation by percussion or palpation, normal fremitus, symmetrical and full respiratory excursions, healthy left subclavian pacemaker site. Cardiovascular: normal position and quality of the apical impulse, regular rhythm, normal first and paradoxically split second heart sounds, no murmurs, rubs or gallops Abdomen: no tenderness or distention, no masses by palpation, no abnormal pulsatility or arterial bruits, normal bowel sounds, no hepatosplenomegaly Extremities: no clubbing, cyanosis; 1+ pitting right ankle and lower calf edema, no edema.  Saphenectomy scars right leg and scar of recent total knee replacement, well-healed; 2+ radial, ulnar and brachial pulses bilaterally; 2+ right femoral, posterior tibial and dorsalis pedis pulses; 2+ left femoral, posterior tibial and dorsalis pedis pulses; no subclavian or femoral bruits Neurological: grossly nonfocal Psych: Normal mood and affect  Wt Readings from Last 3 Encounters:  07/19/17 218 lb 12.8 oz (99.2 kg)  12/02/16 204 lb 12.8  oz (92.9 kg)  09/30/16 209 lb (94.8 kg)    Studies/Labs Reviewed:   EKG:  EKG is ordered today.  The ekg ordered today demonstrates 100% AV pacing  Recent Labs: 09/30/2016: BUN 43; Creatinine, Ser 1.29; Hemoglobin 8.1; Platelets 166; Potassium 4.0; Sodium 137   Lipid Panel Jan 2019 LDL 78  ASSESSMENT:    1. Chronic systolic CHF (congestive heart failure) ([place])   2. Coronary artery disease involving native coronary artery of native heart without angina pectoris   3. PAT (paroxysmal atrial tachycardia) ([place])   4. Essential hypertension   5. Chronic obstructive pulmonary disease, unspecified COPD type ([place])   6. CHB (complete heart block) (HCC)   7. Pacemaker   8. Dyslipidemia      PLAN:  In order of problems listed above:  1. CHF: Appears to be clinically euvolemic, NYHA functional class I-2, fairly sedentary.  He occasionally needs to take an extra dose of furosemide, not recently.  He is on appropriate treatment beta-blocker and ACE inhibitor..  EF is only mildly depressed and he probably does not qualify for Entresto. 2. CAD s/p CABG:  patent grafts at cardiac catheterization 2017. angina free/asymptomatic. 3. PAT: Infrequent, last recorded July 2018 4. HTN: Excellent control 5. COPD:  metoprolol appears to be a better choice than carvedilol for treatment of his coronary disease/heart failure/hypertension due to his recent problems with wheezing. Option to switch to bisoprolol if this is a continued problem. 6. CHB: He has progressed from high-grade second-degree AV block to complete heart block and is pacemaker dependent.  Although he does have an idioventricular escape rhythm at 30 bpm today, this is not reliably present. 7. PPM: Normal device function. May be a candidate for CRT upgrade if LVEF declines further  in the future. Continue remote downloads every 3 months and yearly office visits. 8. HLP: He should continue taking ezetimibe and pravastatin until his follow-up visit w PCP and Dr. Debara Pickett.  He appears to be  close to target LDL under 70, although not quite there.  I wonder whether he would tolerate a more potent statin. 9. DM.   Medication Adjustments/Labs and Tests Ordered: Current medicines are reviewed at length with the patient today.  Concerns regarding medicines are outlined above.  Medication changes, Labs and Tests ordered today are listed in the Patient Instructions below. Patient Instructions  Dr Sallyanne Kuster recommends that you continue on your current medications as directed. Please refer to the Current Medication list given to you today.  Remote monitoring is used to monitor your Pacemaker or ICD from home. This monitoring reduces the number of office visits required to check your device to one time per year. It allows [place] to keep an eye on the functioning of your device to ensure it is working properly. You are scheduled for a device check from home on Monday, May 20th, 2019. You may send your transmission at any time that day. If you have a wireless device, the transmission will be sent automatically. After your physician reviews your transmission, you will receive a notification with your next transmission date.  To improve our patient care and to more adequately follow your device, [facility] has decided, as a practice, to start following each patient four times a year with your home monitor. This means that you may experience a remote appointment that is close to an in-office appointment with your physician. Your insurance will apply at the same rate as other remote monitoring transmissions.  Dr Sallyanne Kuster recommends that  you schedule a follow-up appointment in 12 months with a pacemaker check. You will receive a reminder letter in the mail two months in advance. If you don't receive a letter, please call our office to schedule the follow-up appointment.  If you need a refill on your cardiac medications before your next appointment, please call your pharmacy.    Signed, Sanda Klein,  MD  07/19/2017 9:41 AM    [facility] [place], [place], [place]  [address] Phone: [phone]; Fax: [phone]

## 2017-07-19 NOTE — Telephone Encounter (Signed)
Pt had an appointment with Dr. Sallyanne Kuster today 5/15 @ 9am. Medication was addressed.

## 2017-07-24 ENCOUNTER — Telehealth: Payer: Self-pay | Admitting: Cardiology

## 2017-07-24 ENCOUNTER — Ambulatory Visit (INDEPENDENT_AMBULATORY_CARE_PROVIDER_SITE_OTHER): Payer: Medicare Other | Admitting: *Deleted

## 2017-07-24 DIAGNOSIS — I442 Atrioventricular block, complete: Secondary | ICD-10-CM

## 2017-07-24 NOTE — Telephone Encounter (Signed)
Spoke with pt and reminded pt of remote transmission that is due today. Pt verbalized understanding.   

## 2017-07-25 ENCOUNTER — Encounter: Payer: Self-pay | Admitting: Cardiology

## 2017-07-25 LAB — CUP PACEART REMOTE DEVICE CHECK
Battery Impedance: 425 Ohm
Battery Remaining Longevity: 86 mo
Battery Voltage: 2.79 V
Brady Statistic AP VP Percent: 95 %
Brady Statistic AS VP Percent: 5 %
Date Time Interrogation Session: 20190520204231
Implantable Lead Implant Date: 20140318
Implantable Lead Model: 5076
Implantable Lead Model: 5076
Implantable Pulse Generator Implant Date: 20140318
Lead Channel Impedance Value: 476 Ohm
Lead Channel Pacing Threshold Amplitude: 0.75 V
Lead Channel Setting Pacing Amplitude: 2 V
MDC IDC LEAD IMPLANT DT: 20140318
MDC IDC LEAD LOCATION: 753859
MDC IDC LEAD LOCATION: 753860
MDC IDC MSMT LEADCHNL RA PACING THRESHOLD PULSEWIDTH: 0.4 ms
MDC IDC MSMT LEADCHNL RV IMPEDANCE VALUE: 920 Ohm
MDC IDC MSMT LEADCHNL RV PACING THRESHOLD AMPLITUDE: 0.75 V
MDC IDC MSMT LEADCHNL RV PACING THRESHOLD PULSEWIDTH: 0.4 ms
MDC IDC SET LEADCHNL RV PACING AMPLITUDE: 2.5 V
MDC IDC SET LEADCHNL RV PACING PULSEWIDTH: 0.4 ms
MDC IDC SET LEADCHNL RV SENSING SENSITIVITY: 2.8 mV
MDC IDC STAT BRADY AP VS PERCENT: 1 %
MDC IDC STAT BRADY AS VS PERCENT: 0 %

## 2017-07-25 NOTE — Progress Notes (Signed)
Remote pacemaker transmission.   

## 2017-10-04 ENCOUNTER — Other Ambulatory Visit: Payer: Self-pay | Admitting: Cardiovascular Disease

## 2017-10-04 ENCOUNTER — Other Ambulatory Visit: Payer: Self-pay | Admitting: Internal Medicine

## 2017-10-04 NOTE — Telephone Encounter (Signed)
Rx sent to pharmacy   

## 2017-10-23 ENCOUNTER — Ambulatory Visit (INDEPENDENT_AMBULATORY_CARE_PROVIDER_SITE_OTHER): Payer: Medicare Other | Admitting: *Deleted

## 2017-10-23 DIAGNOSIS — I442 Atrioventricular block, complete: Secondary | ICD-10-CM

## 2017-10-24 NOTE — Progress Notes (Signed)
Remote pacemaker transmission.   

## 2017-11-17 LAB — CUP PACEART REMOTE DEVICE CHECK
Battery Impedance: 499 Ohm
Battery Remaining Longevity: 81 mo
Battery Voltage: 2.78 V
Brady Statistic AP VP Percent: 96 %
Brady Statistic AP VS Percent: 1 %
Brady Statistic AS VP Percent: 3 %
Date Time Interrogation Session: 20190819141445
Implantable Lead Location: 753859
Implantable Lead Location: 753860
Implantable Lead Model: 5076
Implantable Lead Model: 5076
Lead Channel Impedance Value: 475 Ohm
Lead Channel Pacing Threshold Amplitude: 0.75 V
Lead Channel Pacing Threshold Pulse Width: 0.4 ms
Lead Channel Pacing Threshold Pulse Width: 0.4 ms
Lead Channel Setting Pacing Amplitude: 2 V
Lead Channel Setting Pacing Amplitude: 2.5 V
Lead Channel Setting Pacing Pulse Width: 0.4 ms
MDC IDC LEAD IMPLANT DT: 20140318
MDC IDC LEAD IMPLANT DT: 20140318
MDC IDC MSMT LEADCHNL RV IMPEDANCE VALUE: 977 Ohm
MDC IDC MSMT LEADCHNL RV PACING THRESHOLD AMPLITUDE: 0.625 V
MDC IDC PG IMPLANT DT: 20140318
MDC IDC SET LEADCHNL RV SENSING SENSITIVITY: 2.8 mV
MDC IDC STAT BRADY AS VS PERCENT: 0 %

## 2018-01-10 ENCOUNTER — Other Ambulatory Visit: Payer: Self-pay | Admitting: *Deleted

## 2018-01-10 MED ORDER — AMLODIPINE BESYLATE 10 MG PO TABS: 10.0000 mg | ORAL_TABLET | Freq: Every day | ORAL | 3 refills | Status: DC

## 2018-01-10 MED ORDER — LISINOPRIL 20 MG PO TABS: 20.0000 mg | ORAL_TABLET | Freq: Two times a day (BID) | ORAL | 3 refills | Status: DC

## 2018-01-22 ENCOUNTER — Ambulatory Visit (INDEPENDENT_AMBULATORY_CARE_PROVIDER_SITE_OTHER): Payer: Medicare Other

## 2018-01-22 DIAGNOSIS — I5022 Chronic systolic (congestive) heart failure: Secondary | ICD-10-CM

## 2018-01-22 DIAGNOSIS — I442 Atrioventricular block, complete: Secondary | ICD-10-CM

## 2018-01-23 NOTE — Progress Notes (Signed)
Remote pacemaker transmission.   

## 2018-01-25 ENCOUNTER — Encounter: Payer: Self-pay | Admitting: Cardiology

## 2018-03-01 LAB — HM DIABETES EYE EXAM

## 2018-03-20 LAB — CUP PACEART REMOTE DEVICE CHECK
Battery Impedance: 524 Ohm
Battery Remaining Longevity: 78 mo
Brady Statistic AP VP Percent: 96 %
Date Time Interrogation Session: 20191118150019
Implantable Lead Implant Date: 20140318
Implantable Lead Implant Date: 20140318
Implantable Lead Location: 753859
Implantable Lead Model: 5076
Implantable Lead Model: 5076
Implantable Pulse Generator Implant Date: 20140318
Lead Channel Impedance Value: 456 Ohm
Lead Channel Impedance Value: 912 Ohm
Lead Channel Setting Pacing Amplitude: 2 V
Lead Channel Setting Pacing Amplitude: 2.5 V
Lead Channel Setting Sensing Sensitivity: 2.8 mV
MDC IDC LEAD LOCATION: 753860
MDC IDC MSMT BATTERY VOLTAGE: 2.79 V
MDC IDC MSMT LEADCHNL RA PACING THRESHOLD AMPLITUDE: 0.875 V
MDC IDC MSMT LEADCHNL RA PACING THRESHOLD PULSEWIDTH: 0.4 ms
MDC IDC MSMT LEADCHNL RV PACING THRESHOLD AMPLITUDE: 0.625 V
MDC IDC MSMT LEADCHNL RV PACING THRESHOLD PULSEWIDTH: 0.4 ms
MDC IDC SET LEADCHNL RV PACING PULSEWIDTH: 0.4 ms
MDC IDC STAT BRADY AP VS PERCENT: 1 %
MDC IDC STAT BRADY AS VP PERCENT: 3 %
MDC IDC STAT BRADY AS VS PERCENT: 0 %

## 2018-03-21 ENCOUNTER — Telehealth: Payer: Self-pay | Admitting: *Deleted

## 2018-03-21 NOTE — Telephone Encounter (Signed)
-----   Message from Sanda Klein, MD sent at 03/20/2018  8:58 PM EST ----- Remote reviewed.   Complete heart block: pacemaker dependent. Battery status is good. Lead measurements are stable. Heart rate histogram is favorable. One episode of atrial flutter, approximately 49 minutes long. Occurred on 12/20/2017, no recurrence.  Please schedule appointment with Dr. Debara Pickett or myself, first non-urgent available.

## 2018-03-21 NOTE — Telephone Encounter (Signed)
Spoke with patient, advised him that a scheduler will be contacting him to setup an appointment with Dr. Sallyanne Kuster or Dr. Debara Pickett (overdue as of 11/2017 per recall).  Patient reports he had a fall last week. Was walking too fast and tripped over a cement block. Reports he is sore and bruised. Encouraged patient to contact PCP if symptoms don't improve. He verbalizes understanding.  Routed to [facility] scheduling pool.

## 2018-03-21 NOTE — Telephone Encounter (Signed)
Patient is scheduled with Dr. Debara Pickett on 1/16 at 11am.

## 2018-03-22 ENCOUNTER — Encounter (INDEPENDENT_AMBULATORY_CARE_PROVIDER_SITE_OTHER): Payer: Self-pay

## 2018-03-22 ENCOUNTER — Encounter: Payer: Self-pay | Admitting: Internal Medicine

## 2018-03-22 ENCOUNTER — Ambulatory Visit: Payer: Medicare Other | Admitting: Internal Medicine

## 2018-03-22 VITALS — BP 116/64 | HR 70 | Ht 71.0 in | Wt 215.8 lb

## 2018-03-22 DIAGNOSIS — Z951 Presence of aortocoronary bypass graft: Secondary | ICD-10-CM

## 2018-03-22 DIAGNOSIS — I480 Paroxysmal atrial fibrillation: Secondary | ICD-10-CM

## 2018-03-22 DIAGNOSIS — Z950 Presence of cardiac pacemaker: Secondary | ICD-10-CM

## 2018-03-22 DIAGNOSIS — I10 Essential (primary) hypertension: Secondary | ICD-10-CM

## 2018-03-22 DIAGNOSIS — I255 Ischemic cardiomyopathy: Secondary | ICD-10-CM

## 2018-03-22 DIAGNOSIS — E785 Hyperlipidemia, unspecified: Secondary | ICD-10-CM

## 2018-03-22 MED ORDER — APIXABAN 5 MG PO TABS: 5.0000 mg | ORAL_TABLET | Freq: Two times a day (BID) | ORAL | 3 refills | Status: DC

## 2018-03-22 MED ORDER — APIXABAN 5 MG PO TABS: 5.0000 mg | ORAL_TABLET | Freq: Two times a day (BID) | ORAL | 0 refills | Status: DC

## 2018-03-22 NOTE — Progress Notes (Signed)
   OFFICE NOTE  Chief Complaint:  Atrial fibrillation  Primary Care Physician: Wong, Francis P, MD  HPI:  Torres C Buchbinder is a 85-year-old gentleman who we have been following closely with a history of 5-vessel CABG and atrial flutter in 2012. He has done well with lack of chest pain; however, he says he does have low energy which has been pretty much stable, not necessarily improved after surgery. He also has significant abnormalities in his EKG as well indicating a conduction delay. When his episode of atrial flutter was occurring his ventricular response was only in the 40s. He was also noted on EKG to have trifascicular block and periods of Wenckebach. Today, he actually had some more Wenckebach and couplets as well as continued trifascicular block. He has had paroxysmal A-flutter and we have kept him off a beta blocker for those reasons. I placed him on a monitor which demonstrated significant bradycardia for which she was symptomatic and ultimately underwent a permanent pacemaker. He reports that this has helped him significantly and recently followed up with Dr. C in the office. The pacemaker was interrogated and apparently is working properly. Mr. Mumby has no significant complaints other than some right knee pain and itchiness and tenderness along the lower aspect of his midline sternal incision, where he has a keloid scar.  Of note he recently had acute cholecystitis and is status post cholecystectomy.  Mr. Plourde returns for followup today. He saw Dr. C last week for pacemaker check and it seems to be working appropriately. He has had some problems with heart failure. He had fluid gain and it turns out that he was not taking his Lasix on a scheduled basis. He since restarted taking that every day and is feeling much better. He has no complaints of shortness of breath or other symptoms.  08/19/2015  Mr. Sutley was seen today back in the office for follow-up. I had last seen him  in 2015 and he was recently seen by Dr. C in the office. This was for device check at which time he was noted to have some worsening fatigue. He is also noted to have some leg swelling. An echocardiogram was performed and unfortunately shows a marked reduction in LVEF to 30-35%. The ventricle was noted to be mildly dilated with diffuse hypokinesis and grade 2 diastolic dysfunction. He is currently on Lasix 40 mg daily and reports his weight is been fairly stable. He denies any orthopnea, PND or significant worsening of dyspnea on exertion. He has reported however fatigue and also denies any chest pain. Blood pressure is elevated today 170/80. When discussing about possible causes of his cardiomyopathy, he does report that he is using at least a moderate amount of alcohol and drinks 3-4 beers at least 3 times a week.  09/09/2015  Mr. Vanderwerf returns to follow-up his recent heart catheterization. This demonstrated the following:   Mild global LV dysfunction with an ejection fraction of 45-50%. There is significant mitral annular calcification and trace mitral regurgitation.  Mild pulmonary hypertension.  Significant native CAD with 20% mid left main narrowing; diffuse 60-70% proximal and 70% mid LAD stenoses; total occlusion of the proximal circumflex coronary artery with faint antegrade collaterals; and diffusely irregular RCA with mild calcification without apparent high-grade stenosis.  Patent LIMA graft supplying the mid LAD.  Patent sequential vein graft supplying the diagonal 1 and diagonal 2 vessel with mild 30% narrowing in the region of a valve in the proximal portion of the   graft.  Patent vein graft supplying the distal circumflex marginal vessel with smooth 40% narrowing proximally in the region of a valve.  Patent vein graft supplying the distal posterolateral branch of the RCA.  Based on these findings medical therapy was recommended. His filling pressures seem to be well-controlled.  EF was interestingly reported much higher on this study than it was on his echocardiogram. I had recently increased his lisinopril to 20 mg twice a day. We discussed management options today and I feel that he could use better blood pressure control. In addition he recently stopped alcohol completely check commended him on.  11/27/2015  Mr. Melle returns today for follow-up. After some medication adjustments his blood pressure is now much better controlled 120/70. He denies any chest pain or worsening shortness of breath. We've been continuing to up titrate his medications for heart failure. He's also completely stopped alcohol use. He is due for a remote pacemaker check which is coming up fairly soon. His pacemaker is now been in place for about 4 years. It seems to be AV pacing today.  06/03/2016  Mr. Hole is seen today in follow-up. Overall he seems to be doing well. Weight is down about 5 pounds since I last saw him. Is no evidence for heart failure. His weight loss may be attributable to decreased appetite. Blood pressure is good at 124/70. EKG shows a paced rhythm at 75. He says he's had some episodes of palpitations mostly associated with alcohol use which he has significantly reduced if not stopped. He is reporting some bilateral lower extremity swelling. This seems to be worse when sitting for long periods of time and tends to go away almost completely when elevating his feet at night.  12/02/2016  Mr. Peak returns today for follow-up. In July he underwent right total knee replacement. He had 3 weeks of rehabilitation which she said went well for the right knee however they work the left knee a lot and he had developed some inflammatory arthritis requiring drainage. He says that he's overall walking better. He denies any worsening shortness of breath or heart failure symptoms. Blood pressure is well-controlled today. His pacemaker was assessed this past spring by Dr. C and is working  appropriately with adequate battery life. He is reporting some right lower extremity swelling in the area where his right knee replacement was. There is some evidence of venous insufficiency.  03/22/2018  Mr. Darrow is seen today in follow-up.  He was scheduled for routine follow-up but also recently had pacemaker interrogation.  This showed a persistent episode of atrial fibrillation that lasted approximately 49 minutes.  He has not had any further episodes.  He has had episodes of PAF and flutter in the past.  He is increasingly being paced with features of heart block.  He does have a history of cardiomyopathy with EF as low as 35 to 40% however was last at about 45% by cath in 2017.  He denies any worsening shortness of breath.  Unfortunately had a fall a few days ago.  He does have some ecchymosis over the right cheek as well as the right anterior chest.  He has soreness in his chest which hurts worse when he coughs or takes a deep breath and I suspect may represent a rib fracture.  That is the only fall that he has had and it was not related to syncope rather he tripped on an uneven sidewalk.  Full dose aspirin.  Recent labs from November 2019 showed   total cholesterol 119, HDL 47, LDL 47 and triglycerides 126.  Hemoglobin A1c is 6.7.  Creatinine of 1.2.  PMHx:  Past Medical History:  Diagnosis Date  . Anemia    history of  . Arthritis   . Atrial flutter (HCC)    severe bradycardia  . CHF (congestive heart failure) ([place])   . Constipation   . COPD (chronic obstructive pulmonary disease) ([place])   . Coronary artery disease   . Cough   . Diarrhea   . Diastolic dysfunction    Echo 11/06/5054-PVXYI I diastolic dysfunction,EF 01-65%  . DM2 (diabetes mellitus, type 2) ([place])   . Dyslipidemia   . History of kidney stones   . HTN (hypertension)   . Hyperlipidemia   . Leg swelling   . Mobitz type 1 second degree atrioventricular block   . Pacemaker 05/22/2012   Dr  Croitoru    Medtronic Adapta    . 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 SVG to firts & second diagonals,SVG to forth posterolateral branch to RCA  . Sore throat   . Wheezing     Past Surgical History:  Procedure Laterality Date  . CABG x5  09/13/2010   Dr Farrel Conners to LAD,SVG to obtuse marginal,seq. SVG to first & second diagonals,SVG to fourth posterolateral branch RCA  . CARDIAC CATHETERIZATION  09/10/2010   severe LAD,D1,D2 & ostial D2  . CARDIAC CATHETERIZATION N/A 08/28/2015   Procedure: Right/Left Heart Cath and Coronary/Graft Angiography;  Surgeon: Troy Sine, MD;  Location: [facility];  Service: Cardiovascular;  Laterality: N/A;  . CHOLECYSTECTOMY  october 2012  . CORONARY ARTERY BYPASS GRAFT    . INSERT / REPLACE / REMOVE PACEMAKER  05/22/2012    dual chamber  . PERMANENT PACEMAKER INSERTION N/A 05/22/2012   Procedure: PERMANENT PACEMAKER INSERTION;  Surgeon: Sanda Klein, MD;  Location: [facility];  Service: Cardiovascular;  Laterality: N/A;  . repair of left indirect inguinal hernia with mesh.    . repair of umbilical hernia.    Marland Kitchen TOTAL KNEE ARTHROPLASTY Right 09/26/2016   Procedure: TOTAL KNEE ARTHROPLASTY;  Surgeon: Frederik Pear, MD;  Location: [facility];  Service: Orthopedics;  Laterality: Right;    FAMHx:  Family History  Problem Relation Age of Onset  . Heart disease Father   . Heart disease Brother        s/p cabg  . Heart disease Maternal Uncle     SOCHx:   reports that he quit smoking about 7 years ago. His smoking use included cigarettes. He quit after 40.00 years of use. He has never used smokeless tobacco. He reports current alcohol use. He reports current drug use. Drug: Marijuana.  ALLERGIES:  Allergies  Allergen Reactions  . No Known Allergies     ROS: Pertinent items noted in HPI and remainder of comprehensive ROS otherwise negative.  HOME MEDS: Current Outpatient Medications  Medication Sig Dispense Refill  . amLODipine (NORVASC) 10 MG  tablet Take 1 tablet (10 mg total) by mouth daily. 90 tablet 3  . aspirin EC 325 MG tablet Take 1 tablet (325 mg total) by mouth 2 (two) times daily. 30 tablet 0  . Cyanocobalamin (B-12) 1000 MCG/ML KIT Inject 1 Dose as directed every 30 (thirty) days.     Marland Kitchen ezetimibe (ZETIA) 10 MG tablet Take 10 mg by mouth daily.    . fluticasone furoate-vilanterol (BREO ELLIPTA) 200-25 MCG/INH AEPB Inhale 1 puff into the lungs 2 (two)  times daily.    . furosemide (LASIX) 40 MG tablet TAKE 1 TABLET BY MOUTH  DAILY 90 tablet 2  . lisinopril (PRINIVIL,ZESTRIL) 20 MG tablet Take 1 tablet (20 mg total) by mouth 2 (two) times daily. 180 tablet 3  . metFORMIN (GLUCOPHAGE) 500 MG tablet Take 1,000 mg by mouth 2 (two) times daily with a meal.    . metoprolol tartrate (LOPRESSOR) 25 MG tablet TAKE 1 TABLET BY MOUTH TWO  TIMES DAILY 180 tablet 1  . ONETOUCH VERIO test strip daily. As directed  4  . potassium chloride SA (K-DUR,KLOR-CON) 20 MEQ tablet TAKE 1 TABLET BY MOUTH DAILY 30 tablet 10  . traZODone (DESYREL) 50 MG tablet Take 50 mg by mouth at bedtime as needed for sleep.      No current facility-administered medications for this visit.     LABS/IMAGING: No results found for this or any previous visit (from the past 48 hour(s)). No results found.  VITALS: BP 116/64   Pulse 70   Ht '5\' 11"'$  (1.803 m)   Wt 215 lb 12.8 oz (97.9 kg)   BMI 30.10 kg/m   EXAM: General appearance: alert and no distress Neck: no carotid bruit and no JVD Lungs: clear to auscultation bilaterally Heart: regular rate and rhythm, S1, S2 normal, no murmur, click, rub or gallop Abdomen: soft, non-tender; bowel sounds normal; no masses,  no organomegaly Extremities: Status post right TKR, 2+ right lower extremity stasis edema Pulses: 2+ and symmetric Skin: Skin color, texture, turgor normal. No rashes or lesions Neurologic: Grossly normal Psych: Pleasant  EKG: AV paced rhythm at 70-personally  reviewed  ASSESSMENT: 1. Paroxysmal atrial fibrillation-CHADSVASC score of 4 2. Status post right TKR with venous stasis edema 3. Cardiomyopathy with newly reduced EF to 30-35% - no new obstructive disease by cath, EF 45-50% 4. 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 Hypertension-controlled 6. Dyslipidemia-at goal 7. CABG x 5 vessels (2012) 8. Recent moderate alcohol abuse-now abstinent 9. Dependent edema  PLAN:  1.   Mr. Biello had a recent fall and I suspect may have had rib fracture.  Unfortunate there is no necessary treatment for this.  He has some significant bruising of the right cheek and right anterior chest wall.  He is on full dose aspirin.  He has been noted to have paroxysmal atrial fibrillation including a sustained episode of just less than an hour.  He is device unfortunately cannot alarm real-time about recurrent A. fib and therefore we may potentially miss more significant episodes of A. fib in the future which could lead to stroke if he is not anticoagulated.  His CHADSVASC score is 4 and he does have known heart failure.  His last EF determination was in 2017.  Recently has been noted by Dr. Loletha Grayer that he has been in more complete heart block and therefore pacing more frequently.  I would like to obtain another echocardiogram to see if he is had any further reduction in LV function for which he may be a candidate for CRT upgrade.  We did discuss anticoagulation and at this point, I would feel more comfortable with him on Eliquis.  Given his risk of falls, some history of alcohol abuse for which he claims abstinence and the fact that he bruises and bleeds easily, we will discontinue aspirin in lieu of the Eliquis 5 mg twice daily. Plan follow-up with me after the echo.  Pixie Casino, MD, [facility],  [place] Director of the Advanced Lipid Disorders &  Cardiovascular  [facility] Reduction Clinic Diplomate of the American Board of Clinical Lipidology Attending Cardiologist  Direct Dial: [phone]  Fax: [phone]  Website:  [email or web address] 03/22/2018, 11:16 AM

## 2018-03-22 NOTE — Patient Instructions (Addendum)
Medication Instructions:  STOP aspirin  START eliquis 5mg  twice daily  Samples provided + free 30 day voucher If you need a refill on your cardiac medications before your next appointment, please call your pharmacy.   Testing/Procedures: Your physician has requested that you have an echocardiogram. Echocardiography is a painless test that uses sound waves to create images of your heart. It provides your doctor with information about the size and shape of your heart and how well your heart's chambers and valves are working. This procedure takes approximately one hour. There are no restrictions for this procedure. -- done at [address] - [address]  Follow-Up: At [facility], you and your health needs are our priority.  As part of our continuing mission to provide you with exceptional heart care, we have created designated Provider Care Teams.  These Care Teams include your primary Cardiologist (physician) and Advanced Practice Providers (APPs -  Physician Assistants and Nurse Practitioners) who all work together to provide you with the care you need, when you need it. You will need a follow up appointment after your echocardiogram. You may see Dr. Debara Pickett or one of the following Advanced Practice Providers on your designated Care Team: Almyra Deforest, [place] . Fabian Sharp, PA-C  Any Other Special Instructions Will Be Listed Below (If Applicable).

## 2018-03-22 NOTE — Progress Notes (Signed)
Sounds sensible MCr

## 2018-03-23 ENCOUNTER — Telehealth: Payer: Self-pay | Admitting: Internal Medicine

## 2018-03-23 NOTE — Telephone Encounter (Signed)
Returned call to patient's daughter (DPR) and explained highlights from MD visit yesterday - rationale behind change from ASA to eliquis (AF episode) and echo (evaluate cardiomyopathy, EF). Did stress to daughter importance of patient not using BC powder for headaches, as patient expressed this to nurse yesterday upon leaving. She will remind him of this. No further assistance needed at this time.

## 2018-03-23 NOTE — Telephone Encounter (Signed)
New message   Patient's daughter would like a call to go over information that was discussed during yesterday's office visit.

## 2018-03-29 ENCOUNTER — Other Ambulatory Visit: Payer: Self-pay

## 2018-03-29 ENCOUNTER — Ambulatory Visit (HOSPITAL_COMMUNITY): Payer: Medicare Other | Attending: Cardiovascular Disease

## 2018-03-29 DIAGNOSIS — I255 Ischemic cardiomyopathy: Secondary | ICD-10-CM | POA: Insufficient documentation

## 2018-04-12 ENCOUNTER — Ambulatory Visit: Payer: Medicare Other | Admitting: Internal Medicine

## 2018-04-19 ENCOUNTER — Other Ambulatory Visit: Payer: Self-pay | Admitting: Internal Medicine

## 2018-04-23 ENCOUNTER — Ambulatory Visit (INDEPENDENT_AMBULATORY_CARE_PROVIDER_SITE_OTHER): Payer: Medicare Other

## 2018-04-23 DIAGNOSIS — I5022 Chronic systolic (congestive) heart failure: Secondary | ICD-10-CM

## 2018-04-23 DIAGNOSIS — I441 Atrioventricular block, second degree: Secondary | ICD-10-CM

## 2018-04-23 LAB — CUP PACEART REMOTE DEVICE CHECK
Battery Impedance: 549 Ohm
Brady Statistic AP VP Percent: 96 %
Brady Statistic AP VS Percent: 1 %
Brady Statistic AS VS Percent: 0 %
Date Time Interrogation Session: 20200217114753
Implantable Lead Implant Date: 20140318
Implantable Lead Location: 753860
Implantable Pulse Generator Implant Date: 20140318
Lead Channel Impedance Value: 737 Ohm
Lead Channel Pacing Threshold Amplitude: 0.75 V
Lead Channel Pacing Threshold Pulse Width: 0.4 ms
Lead Channel Setting Sensing Sensitivity: 2.8 mV
MDC IDC LEAD IMPLANT DT: 20140318
MDC IDC LEAD LOCATION: 753859
MDC IDC MSMT BATTERY REMAINING LONGEVITY: 75 mo
MDC IDC MSMT BATTERY VOLTAGE: 2.78 V
MDC IDC MSMT LEADCHNL RA IMPEDANCE VALUE: 443 Ohm
MDC IDC MSMT LEADCHNL RA PACING THRESHOLD AMPLITUDE: 0.75 V
MDC IDC MSMT LEADCHNL RV PACING THRESHOLD PULSEWIDTH: 0.4 ms
MDC IDC SET LEADCHNL RA PACING AMPLITUDE: 2 V
MDC IDC SET LEADCHNL RV PACING AMPLITUDE: 2.5 V
MDC IDC SET LEADCHNL RV PACING PULSEWIDTH: 0.4 ms
MDC IDC STAT BRADY AS VP PERCENT: 3 %

## 2018-04-25 ENCOUNTER — Telehealth: Payer: Self-pay | Admitting: Internal Medicine

## 2018-04-25 NOTE — Telephone Encounter (Signed)
Please advise. Thanks.  

## 2018-04-25 NOTE — Telephone Encounter (Signed)
°*  STAT* If patient is at the pharmacy, call can be transferred to refill team.   1. Which medications need to be refilled? (please list name of each medication and dose if known)  ELIQUIS 5 MG TABS tablet  2. Which pharmacy/location (including street and city if local pharmacy) is medication to be sent to? [facility] [phone]  3. Do they need a 30 day or 90 day supply? 30   Appt on 04/12/18 was not completed because Dr. Debara Pickett had a delay

## 2018-04-26 ENCOUNTER — Telehealth: Payer: Self-pay | Admitting: Internal Medicine

## 2018-04-26 MED ORDER — APIXABAN 5 MG PO TABS: ORAL_TABLET | ORAL | 1 refills | Status: DC

## 2018-04-26 NOTE — Telephone Encounter (Signed)
Called patient, advised to continue his medications as Dr.Hilty explained as last visit, and to continue Eliquis until told other wise.  Patient verbalized understanding.

## 2018-04-26 NOTE — Telephone Encounter (Signed)
New Message   Pt c/o medication issue:  1. Name of Medication: apixaban (ELIQUIS) 5 MG TABS tablet    2. How are you currently taking this medication (dosage and times per day)?   3. Are you having a reaction (difficulty breathing--STAT)?   4. What is your medication issue? Patient is unsure if he should still take this medication or go back to his aspirin. Please call to discuss.

## 2018-04-27 ENCOUNTER — Ambulatory Visit: Payer: Medicare Other | Admitting: Internal Medicine

## 2018-05-02 NOTE — Progress Notes (Signed)
Remote pacemaker transmission.   

## 2018-05-10 ENCOUNTER — Encounter: Payer: Self-pay | Admitting: Physician Assistant

## 2018-05-10 ENCOUNTER — Ambulatory Visit (INDEPENDENT_AMBULATORY_CARE_PROVIDER_SITE_OTHER): Payer: Medicare Other | Admitting: Physician Assistant

## 2018-05-10 VITALS — BP 133/70 | HR 93 | Ht 71.0 in | Wt 222.0 lb

## 2018-05-10 DIAGNOSIS — J449 Chronic obstructive pulmonary disease, unspecified: Secondary | ICD-10-CM

## 2018-05-10 DIAGNOSIS — Z950 Presence of cardiac pacemaker: Secondary | ICD-10-CM

## 2018-05-10 DIAGNOSIS — E119 Type 2 diabetes mellitus without complications: Secondary | ICD-10-CM | POA: Diagnosis not present

## 2018-05-10 DIAGNOSIS — I4892 Unspecified atrial flutter: Secondary | ICD-10-CM | POA: Diagnosis not present

## 2018-05-10 DIAGNOSIS — E785 Hyperlipidemia, unspecified: Secondary | ICD-10-CM

## 2018-05-10 DIAGNOSIS — I25810 Atherosclerosis of coronary artery bypass graft(s) without angina pectoris: Secondary | ICD-10-CM | POA: Diagnosis not present

## 2018-05-10 DIAGNOSIS — I255 Ischemic cardiomyopathy: Secondary | ICD-10-CM

## 2018-05-10 DIAGNOSIS — R600 Localized edema: Secondary | ICD-10-CM

## 2018-05-10 NOTE — Progress Notes (Signed)
Cardiology Office Note    Date:  05/12/2018   ID:  Jesus Oconnor, DOB 1933/11/11, MRN [id number]  PCP:  Vernie Shanks, MD  Cardiologist: Dr. Romilda Garret management: Dr. Sallyanne Kuster  Chief Complaint  Patient presents with  . Follow-up    seen for Dr. Debara Pickett    History of Present Illness:  WAI LITT is a 85 y.o. male with PMH of atrial flutter, COPD, CAD s/p 5v CABG, DM 2, high degree AV block s/p PPM, and hyperlipidemia.  Echocardiogram demonstrated his EF went down to 30 to 35% x 07/21/2015.  He ultimately underwent cardiac catheterization on 08/28/2015 which showed 60% ostial LAD, 70% ostial to proximal LAD, 100% proximal left circumflex lesion, patent LIMA to LAD, patent sequential SVG to D1 and D2, patent SVG to distal left circumflex, patent SVG to distal posterolateral branch of RCA.  Medical therapy was recommended.  His lisinopril was further increased.  Patient was last seen by Dr. Debara Pickett in the office on 03/22/2018, he had a fall prior to the office visit and he has some chest soreness.  He was noted to have some atrial fibrillation on device interrogation.  His aspirin was discontinued and he was placed on Eliquis 5 mg twice daily.  Echocardiogram was obtained on 03/29/2018 which showed EF improved to 40 to 45%, grade 2 DD, paradoxical ventricular septal wall motion, severe LAE, PA peak pressure 35 mmHg.  Patient presents today for cardiology office visit.  He states he went back to aspirin for a few days after finishing the 30-day supply due to inability to afford Eliquis.  We have given him samples of Eliquis and also provided medication assistance form to the patient as well.  He has chronic 2+ right lower extremity edema and no significant left lower extremity edema.  I recommended continued leg elevation to help control lower extremity swelling.  Otherwise he appears to be euvolemic on physical exam.   Past Medical History:  Diagnosis Date  . Anemia    history of  .  Arthritis   . Atrial flutter (HCC)    severe bradycardia  . CHF (congestive heart failure) ([place])   . Constipation   . COPD (chronic obstructive pulmonary disease) ([place])   . Coronary artery disease   . Cough   . Diarrhea   . Diastolic dysfunction    Echo 08/06/7033-[id number] I diastolic dysfunction,EF 18-29%  . DM2 (diabetes mellitus, type 2) ([place])   . Dyslipidemia   . History of kidney stones   . HTN (hypertension)   . Hyperlipidemia   . Leg swelling   . Mobitz type 1 second degree atrioventricular block   . Pacemaker 05/22/2012   Dr  Croitoru    [employer]onic Adapta  . 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 SVG to firts & second diagonals,SVG to forth posterolateral branch to RCA  . Sore throat   . Wheezing     Past Surgical History:  Procedure Laterality Date  . CABG x5  09/13/2010   Dr Farrel Conners to LAD,SVG to obtuse marginal,seq. SVG to first & second diagonals,SVG to fourth posterolateral branch RCA  . CARDIAC CATHETERIZATION  09/10/2010   severe LAD,D1,D2 & ostial D2  . CARDIAC CATHETERIZATION N/A 08/28/2015   Procedure: Right/Left Heart Cath and Coronary/Graft Angiography;  Surgeon: Troy Sine, MD;  Location: [facility];  Service: Cardiovascular;  Laterality: N/A;  . CHOLECYSTECTOMY  october 2012  . CORONARY ARTERY BYPASS GRAFT    .  INSERT / REPLACE / REMOVE PACEMAKER  05/22/2012    dual chamber  . PERMANENT PACEMAKER INSERTION N/A 05/22/2012   Procedure: PERMANENT PACEMAKER INSERTION;  Surgeon: Sanda Klein, MD;  Location: [facility];  Service: Cardiovascular;  Laterality: N/A;  . repair of left indirect inguinal hernia with mesh.    . repair of umbilical hernia.    Marland Kitchen TOTAL KNEE ARTHROPLASTY Right 09/26/2016   Procedure: TOTAL KNEE ARTHROPLASTY;  Surgeon: Frederik Pear, MD;  Location: [facility];  Service: Orthopedics;  Laterality: Right;    Current Medications: Outpatient Medications Prior to Visit  Medication Sig Dispense Refill  . amLODipine  (NORVASC) 10 MG tablet Take 1 tablet (10 mg total) by mouth daily. 90 tablet 3  . apixaban (ELIQUIS) 5 MG TABS tablet TAKE 1 TABLET(5 MG) BY MOUTH TWICE DAILY 180 tablet 1  . Cyanocobalamin (B-12) 1000 MCG/ML KIT Inject 1 Dose as directed every 30 (thirty) days.     Marland Kitchen ezetimibe (ZETIA) 10 MG tablet Take 10 mg by mouth daily.    . fluticasone furoate-vilanterol (BREO ELLIPTA) 200-25 MCG/INH AEPB Inhale 1 puff into the lungs 2 (two) times daily.    . furosemide (LASIX) 40 MG tablet TAKE 1 TABLET BY MOUTH  DAILY 90 tablet 2  . lisinopril (PRINIVIL,ZESTRIL) 20 MG tablet Take 1 tablet (20 mg total) by mouth 2 (two) times daily. 180 tablet 3  . metFORMIN (GLUCOPHAGE) 500 MG tablet Take 1,000 mg by mouth 2 (two) times daily with a meal.    . metoprolol tartrate (LOPRESSOR) 25 MG tablet TAKE 1 TABLET BY MOUTH TWO  TIMES DAILY 180 tablet 1  . ONETOUCH VERIO test strip daily. As directed  4  . potassium chloride SA (K-DUR,KLOR-CON) 20 MEQ tablet TAKE 1 TABLET BY MOUTH DAILY 30 tablet 10  . traZODone (DESYREL) 50 MG tablet Take 50 mg by mouth at bedtime as needed for sleep.      No facility-administered medications prior to visit.      Allergies:   No known allergies   Social History   Socioeconomic History  . Marital status: Widowed    Spouse name: Not on file  . Number of children: 2  . Years of education: Not on file  . Highest education level: Not on file  Occupational History  . Not on file  Social Needs  . Financial resource strain: Not on file  . Food insecurity:    Worry: Not on file    Inability: Not on file  . Transportation needs:    Medical: Not on file    Non-medical: Not on file  Tobacco Use  . Smoking status: Former Smoker    Years: 40.00    Types: Cigarettes    Last attempt to quit: 08/06/2010    Years since quitting: 7.7  . Smokeless tobacco: Never Used  Substance and Sexual Activity  . Alcohol use: Yes    Comment: 5-6 beers per week  . Drug use: Yes    Types:  Marijuana    Comment: couple times a month  . Sexual activity: Not on file  Lifestyle  . Physical activity:    Days per week: Not on file    Minutes per session: Not on file  . Stress: Not on file  Relationships  . Social connections:    Talks on phone: Not on file    Gets together: Not on file    Attends religious service: Not on file    Active member of club or  organization: Not on file    Attends meetings of clubs or organizations: Not on file    Relationship status: Not on file  Other Topics Concern  . Not on file  Social History Narrative  . Not on file     Family History:  The patient's family history includes Heart disease in his brother, father, and maternal uncle.   ROS:   Please see the history of present illness.    ROS All other systems reviewed and are negative.   PHYSICAL EXAM:   VS:  BP 133/70   Pulse 93   Ht _0  (1.803 m)   Wt 222 lb (100.7 kg)   BMI 30.96 kg/m    GEN: Well nourished, well developed, in no acute distress  HEENT: normal  Neck: no JVD, carotid bruits, or masses Cardiac: RRR; no murmurs, rubs, or gallops. 2+ RLE edema  Respiratory:  clear to auscultation bilaterally, normal work of breathing GI: soft, nontender, nondistended, + BS MS: no deformity or atrophy  Skin: warm and dry, no rash Neuro:  Alert and Oriented x 3, Strength and sensation are intact Psych: euthymic mood, full affect  Wt Readings from Last 3 Encounters:  05/10/18 222 lb (100.7 kg)  03/22/18 215 lb 12.8 oz (97.9 kg)  07/19/17 218 lb 12.8 oz (99.2 kg)      Studies/Labs Reviewed:   EKG:  EKG is not ordered today.    Recent Labs: No results found for requested labs within last 8760 hours.   Lipid Panel    Component Value Date/Time   CHOL 176 09/10/2010 0623   TRIG 94 09/10/2010 0623   HDL 50 09/10/2010 0623   CHOLHDL 3.5 09/10/2010 0623   VLDL 19 09/10/2010 0623   LDLCALC 107 (H) 09/10/2010 0623    Additional studies/ records that were reviewed  today include:   Cath 08/28/2015  Ost LAD lesion, 60% stenosed.  Ost LAD to Prox LAD lesion, 70% stenosed.  SVG is moderate in size.  The graft exhibits minimal luminal irregularities.  SVG .  Origin lesion, 40% stenosed.  Prox Cx lesion, 100% stenosed.  SVG .  Prox Graft lesion, before 1st Diag, 30% stenosed.  The SVG supplying the distal RCA had mild luminal irregularity but was free of significant stenosis.  The SVG supplying the distal marginal vessel was patent. There appeared to be at 30-40% narrowing proximally in the region of the valve.  The sequential vein graft supplying the first and second diagonal vessel was patent. There was a 30% proximal area of narrowing which appeared to be region of the valve.  LIMA is normal in caliber, and is anatomically normal.  The LIMA graft was widely patent and anastomosed into the mid LAD.  There is mild left ventricular systolic dysfunction.  Ost LM lesion, 20% stenosed.    Mild global LV dysfunction with an ejection fraction of 45-50%.  There is significant mitral annular calcification and trace mitral regurgitation.  Mild pulmonary hypertension.  Significant native CAD with 20% mid left main narrowing; diffuse 60-70% proximal and 70% mid LAD stenoses; total occlusion of the proximal circumflex coronary artery with faint antegrade collaterals; and diffusely irregular RCA with mild calcification without apparent high-grade stenosis.  Patent LIMA graft supplying the mid LAD.  Patent sequential vein graft supplying the diagonal 1 and diagonal 2 vessel with mild 30% narrowing in the region of a valve in the proximal portion of the graft.  Patent vein graft supplying the distal circumflex marginal vessel  with smooth 40% narrowing proximally in the region of a valve.  Patent vein graft supplying the distal posterolateral branch of the RCA.  RECOMMENDATION: Medical therapy.   Echo 03/29/2018 LV EF: 40% -    45% Study Conclusions  - Left ventricle: The cavity size was normal. There was moderate   concentric hypertrophy. Systolic function was mildly to   moderately reduced. The estimated ejection fraction was in the   range of 40% to 45%. Features are consistent with a pseudonormal   left ventricular filling pattern, with concomitant abnormal   relaxation and increased filling pressure (grade 2 diastolic   dysfunction). - Ventricular septum: Septal motion showed paradox. These changes   are consistent with right ventricular pacing. - Mitral valve: Calcified annulus. - Left atrium: The atrium was severely dilated. - Right ventricle: Systolic function was moderately reduced. - Right atrium: The atrium was moderately dilated. - Pulmonary arteries: Systolic pressure was mildly increased. PA   peak pressure: 35 mm Hg (S).     ASSESSMENT:    1. Atrial flutter, unspecified type ([place])   2. Coronary artery disease involving coronary bypass graft of native heart without angina pectoris   3. Controlled type 2 diabetes mellitus without complication, without long-term current use of insulin ([place])   4. Pacemaker   5. Chronic obstructive pulmonary disease, unspecified COPD type ([place])   6. Hyperlipidemia LDL goal <70   7. Ischemic cardiomyopathy   8. Leg edema, right      PLAN:  In order of problems listed above:  1. Atrial flutter: Continue Eliquis 5 mg twice daily and metoprolol for rate control  2. CAD s/p CABG: No further chest discomfort.  3. Right lower extremity edema: No sign of left lower extremity edema, 2+ right lower extremity edema.  Continue leg elevation and salt restriction  4. DM2: Managed by primary care provider  5. Pacemaker: Monitor by Dr. Sallyanne Kuster  6. COPD: No acute exacerbation    Medication Adjustments/Labs and Tests Ordered: Current medicines are reviewed at length with the patient today.  Concerns regarding medicines are outlined above.  Medication changes,  Labs and Tests ordered today are listed in the Patient Instructions below. Patient Instructions  Medication Instructions:  Your physician recommends that you continue on your current medications as directed. Please refer to the Current Medication list given to you today.  If you need a refill on your cardiac medications before your next appointment, please call your pharmacy.   Lab work: NONE  If you have labs (blood work) drawn today and your tests are completely normal, you will receive your results only by: Marland Kitchen MyChart Message (if you have MyChart) OR . A paper copy in the mail If you have any lab test that is abnormal or we need to change your treatment, we will call you to review the results.  Testing/Procedures: NONE  Follow-Up: At [facility], you and your health needs are our priority.  As part of our continuing mission to provide you with exceptional heart care, we have created designated Provider Care Teams.  These Care Teams include your primary Cardiologist (physician) and Advanced Practice Providers (APPs -  Physician Assistants and Nurse Practitioners) who all work together to provide you with the care you need, when you need it. You will need a follow up appointment in 3-4 months.  Please call our office 2 months in advance to schedule this appointment.  You may see Pixie Casino, MD or one of the following Advanced Practice  Providers on your designated Care Team: Almyra Deforest, [place] . Fabian Sharp, PA-C  Any Other Special Instructions Will Be Listed Below (If Applicable).       Hilbert Corrigan, [place]  05/12/2018 11:39 PM    [facility] [address], [place], [place]  [address] Phone: [phone]; Fax: [phone]

## 2018-05-10 NOTE — Patient Instructions (Addendum)
Medication Instructions:  Your physician recommends that you continue on your current medications as directed. Please refer to the Current Medication list given to you today.  If you need a refill on your cardiac medications before your next appointment, please call your pharmacy.   Lab work: NONE  If you have labs (blood work) drawn today and your tests are completely normal, you will receive your results only by: Marland Kitchen MyChart Message (if you have MyChart) OR . A paper copy in the mail If you have any lab test that is abnormal or we need to change your treatment, we will call you to review the results.  Testing/Procedures: NONE  Follow-Up: At [facility], you and your health needs are our priority.  As part of our continuing mission to provide you with exceptional heart care, we have created designated Provider Care Teams.  These Care Teams include your primary Cardiologist (physician) and Advanced Practice Providers (APPs -  Physician Assistants and Nurse Practitioners) who all work together to provide you with the care you need, when you need it. You will need a follow up appointment in 3-4 months.  Please call our office 2 months in advance to schedule this appointment.  You may see Pixie Casino, MD or one of the following Advanced Practice Providers on your designated Care Team: [place], [place] . Fabian Sharp, PA-C  Any Other Special Instructions Will Be Listed Below (If Applicable).

## 2018-05-12 ENCOUNTER — Encounter: Payer: Self-pay | Admitting: Physician Assistant

## 2018-05-28 ENCOUNTER — Telehealth: Payer: Self-pay

## 2018-05-28 NOTE — Telephone Encounter (Signed)
Called patient's daughter Cordelia Pen about information involoving her father's patient assistance form for his Eliquis

## 2018-06-05 ENCOUNTER — Telehealth: Payer: Self-pay | Admitting: Internal Medicine

## 2018-06-05 NOTE — Telephone Encounter (Signed)
Follow up   Patients daughter Jeannene Patella is returning call.

## 2018-06-05 NOTE — Telephone Encounter (Signed)
Returned call to patient's daughter Jeannene Patella she was calling to verify next appointment with Dr.Hilty.Advised appointment already scheduled with Dr.Hilty 08/29/18 at 10:30 am.Advised to call sooner if needed.

## 2018-06-05 NOTE — Telephone Encounter (Signed)
Returned call to patient's daughter Jeannene Patella no answer.[place].

## 2018-06-05 NOTE — Telephone Encounter (Signed)
New Message           Patient's daughter called and would like a call back concerning the cancelled appt.

## 2018-06-05 NOTE — Telephone Encounter (Signed)
Unable to connect to patient.

## 2018-06-07 ENCOUNTER — Ambulatory Visit: Payer: Medicare Other | Admitting: Internal Medicine

## 2018-06-10 ENCOUNTER — Other Ambulatory Visit: Payer: Self-pay | Admitting: Cardiovascular Disease

## 2018-06-11 NOTE — Telephone Encounter (Signed)
Lopressor refilled. 

## 2018-06-22 ENCOUNTER — Other Ambulatory Visit: Payer: Self-pay

## 2018-06-22 ENCOUNTER — Observation Stay (HOSPITAL_COMMUNITY)
Admission: EM | Admit: 2018-06-22 | Discharge: 2018-06-23 | Disposition: A | Discharge status: Home / Self Care | Payer: Medicare Other | Attending: Internal Medicine | Admitting: Internal Medicine

## 2018-06-22 ENCOUNTER — Encounter (HOSPITAL_COMMUNITY): Payer: Self-pay | Admitting: Pharmacy Technician

## 2018-06-22 ENCOUNTER — Telehealth: Payer: Self-pay | Admitting: Internal Medicine

## 2018-06-22 DIAGNOSIS — I5022 Chronic systolic (congestive) heart failure: Secondary | ICD-10-CM | POA: Diagnosis present

## 2018-06-22 DIAGNOSIS — N179 Acute kidney failure, unspecified: Secondary | ICD-10-CM | POA: Diagnosis not present

## 2018-06-22 DIAGNOSIS — D649 Anemia, unspecified: Secondary | ICD-10-CM | POA: Insufficient documentation

## 2018-06-22 DIAGNOSIS — I5042 Chronic combined systolic (congestive) and diastolic (congestive) heart failure: Secondary | ICD-10-CM | POA: Insufficient documentation

## 2018-06-22 DIAGNOSIS — I110 Hypertensive heart disease with heart failure: Secondary | ICD-10-CM | POA: Diagnosis not present

## 2018-06-22 DIAGNOSIS — M1990 Unspecified osteoarthritis, unspecified site: Secondary | ICD-10-CM | POA: Insufficient documentation

## 2018-06-22 DIAGNOSIS — I255 Ischemic cardiomyopathy: Secondary | ICD-10-CM | POA: Diagnosis not present

## 2018-06-22 DIAGNOSIS — Z87442 Personal history of urinary calculi: Secondary | ICD-10-CM | POA: Diagnosis not present

## 2018-06-22 DIAGNOSIS — Z87891 Personal history of nicotine dependence: Secondary | ICD-10-CM | POA: Diagnosis not present

## 2018-06-22 DIAGNOSIS — R059 Cough, unspecified: Secondary | ICD-10-CM

## 2018-06-22 DIAGNOSIS — Z794 Long term (current) use of insulin: Secondary | ICD-10-CM

## 2018-06-22 DIAGNOSIS — R05 Cough: Secondary | ICD-10-CM | POA: Diagnosis present

## 2018-06-22 DIAGNOSIS — Z950 Presence of cardiac pacemaker: Secondary | ICD-10-CM | POA: Insufficient documentation

## 2018-06-22 DIAGNOSIS — Z7951 Long term (current) use of inhaled steroids: Secondary | ICD-10-CM | POA: Diagnosis not present

## 2018-06-22 DIAGNOSIS — Z7984 Long term (current) use of oral hypoglycemic drugs: Secondary | ICD-10-CM | POA: Insufficient documentation

## 2018-06-22 DIAGNOSIS — I4892 Unspecified atrial flutter: Secondary | ICD-10-CM | POA: Diagnosis present

## 2018-06-22 DIAGNOSIS — Z79899 Other long term (current) drug therapy: Secondary | ICD-10-CM | POA: Diagnosis not present

## 2018-06-22 DIAGNOSIS — I872 Venous insufficiency (chronic) (peripheral): Secondary | ICD-10-CM | POA: Insufficient documentation

## 2018-06-22 DIAGNOSIS — I2510 Atherosclerotic heart disease of native coronary artery without angina pectoris: Secondary | ICD-10-CM | POA: Insufficient documentation

## 2018-06-22 DIAGNOSIS — J449 Chronic obstructive pulmonary disease, unspecified: Secondary | ICD-10-CM | POA: Diagnosis present

## 2018-06-22 DIAGNOSIS — E785 Hyperlipidemia, unspecified: Secondary | ICD-10-CM | POA: Diagnosis not present

## 2018-06-22 DIAGNOSIS — Z7901 Long term (current) use of anticoagulants: Secondary | ICD-10-CM | POA: Diagnosis not present

## 2018-06-22 DIAGNOSIS — I480 Paroxysmal atrial fibrillation: Secondary | ICD-10-CM | POA: Diagnosis present

## 2018-06-22 DIAGNOSIS — I441 Atrioventricular block, second degree: Secondary | ICD-10-CM | POA: Diagnosis not present

## 2018-06-22 DIAGNOSIS — E119 Type 2 diabetes mellitus without complications: Secondary | ICD-10-CM | POA: Diagnosis not present

## 2018-06-22 DIAGNOSIS — Z951 Presence of aortocoronary bypass graft: Secondary | ICD-10-CM | POA: Insufficient documentation

## 2018-06-22 DIAGNOSIS — I10 Essential (primary) hypertension: Secondary | ICD-10-CM | POA: Diagnosis present

## 2018-06-22 LAB — BASIC METABOLIC PANEL
Anion gap: 9 (ref 5–15)
BUN: 61 mg/dL — ABNORMAL HIGH (ref 8–23)
CO2: 22 mmol/L (ref 22–32)
Calcium: 9.5 mg/dL (ref 8.9–10.3)
Chloride: 107 mmol/L (ref 98–111)
Creatinine, Ser: 2.81 mg/dL — ABNORMAL HIGH (ref 0.61–1.24)
GFR calc Af Amer: 23 mL/min — ABNORMAL LOW (ref >60)
GFR calc non Af Amer: 20 mL/min — ABNORMAL LOW (ref >60)
Glucose, Bld: 98 mg/dL (ref 70–99)
Potassium: 5.1 mmol/L (ref 3.5–5.1)
Sodium: 138 mmol/L (ref 135–145)

## 2018-06-22 LAB — CBC
HCT: 29.1 % — ABNORMAL LOW (ref 39.0–52.0)
Hemoglobin: 9.3 g/dL — ABNORMAL LOW (ref 13.0–17.0)
MCH: 31.6 pg (ref 26.0–34.0)
MCHC: 32 g/dL (ref 30.0–36.0)
MCV: 99 fL (ref 80.0–100.0)
Platelets: 158 10*3/uL (ref 150–400)
RBC: 2.94 MIL/uL — ABNORMAL LOW (ref 4.22–5.81)
RDW: 12.7 % (ref 11.5–15.5)
WBC: 5.9 10*3/uL (ref 4.0–10.5)
nRBC: 0 % (ref 0.0–0.2)

## 2018-06-22 NOTE — ED Triage Notes (Signed)
Pt states PCP sent him here due to worsening kidney function.

## 2018-06-22 NOTE — ED Notes (Signed)
Called patients name in waiting room to assess vitals, no response.

## 2018-06-22 NOTE — ED Notes (Signed)
Ca

## 2018-06-23 ENCOUNTER — Inpatient Hospital Stay (HOSPITAL_COMMUNITY): Payer: Medicare Other

## 2018-06-23 ENCOUNTER — Emergency Department (HOSPITAL_COMMUNITY): Payer: Medicare Other

## 2018-06-23 DIAGNOSIS — I5022 Chronic systolic (congestive) heart failure: Secondary | ICD-10-CM

## 2018-06-23 DIAGNOSIS — R05 Cough: Secondary | ICD-10-CM

## 2018-06-23 DIAGNOSIS — I255 Ischemic cardiomyopathy: Secondary | ICD-10-CM

## 2018-06-23 DIAGNOSIS — N179 Acute kidney failure, unspecified: Secondary | ICD-10-CM | POA: Diagnosis not present

## 2018-06-23 DIAGNOSIS — I4892 Unspecified atrial flutter: Secondary | ICD-10-CM

## 2018-06-23 DIAGNOSIS — D649 Anemia, unspecified: Secondary | ICD-10-CM | POA: Diagnosis not present

## 2018-06-23 DIAGNOSIS — J449 Chronic obstructive pulmonary disease, unspecified: Secondary | ICD-10-CM | POA: Diagnosis present

## 2018-06-23 DIAGNOSIS — I10 Essential (primary) hypertension: Secondary | ICD-10-CM

## 2018-06-23 DIAGNOSIS — E119 Type 2 diabetes mellitus without complications: Secondary | ICD-10-CM

## 2018-06-23 LAB — URINALYSIS, ROUTINE W REFLEX MICROSCOPIC
Bilirubin Urine: NEGATIVE
Glucose, UA: NEGATIVE mg/dL
Ketones, ur: NEGATIVE mg/dL
Leukocytes,Ua: NEGATIVE
Nitrite: NEGATIVE
Protein, ur: 30 mg/dL — AB
Specific Gravity, Urine: 1.011 (ref 1.005–1.030)
pH: 5 (ref 5.0–8.0)

## 2018-06-23 LAB — CREATININE, URINE, RANDOM: Creatinine, Urine: 79.95 mg/dL

## 2018-06-23 LAB — HEPATIC FUNCTION PANEL
ALT: 11 U/L (ref 0–44)
AST: 12 U/L — ABNORMAL LOW (ref 15–41)
Albumin: 4 g/dL (ref 3.5–5.0)
Alkaline Phosphatase: 37 U/L — ABNORMAL LOW (ref 38–126)
Bilirubin, Direct: <0.1 mg/dL (ref 0.0–0.2)
Total Bilirubin: 0.8 mg/dL (ref 0.3–1.2)
Total Protein: 7.2 g/dL (ref 6.5–8.1)

## 2018-06-23 LAB — BASIC METABOLIC PANEL
Anion gap: 12 (ref 5–15)
BUN: 56 mg/dL — ABNORMAL HIGH (ref 8–23)
CO2: 18 mmol/L — ABNORMAL LOW (ref 22–32)
Calcium: 9.3 mg/dL (ref 8.9–10.3)
Chloride: 110 mmol/L (ref 98–111)
Creatinine, Ser: 2.56 mg/dL — ABNORMAL HIGH (ref 0.61–1.24)
GFR calc Af Amer: 26 mL/min — ABNORMAL LOW (ref >60)
GFR calc non Af Amer: 22 mL/min — ABNORMAL LOW (ref >60)
Glucose, Bld: 115 mg/dL — ABNORMAL HIGH (ref 70–99)
Potassium: 4.6 mmol/L (ref 3.5–5.1)
Sodium: 140 mmol/L (ref 135–145)

## 2018-06-23 LAB — HEPARIN LEVEL (UNFRACTIONATED): Heparin Unfractionated: >2.2 IU/mL — ABNORMAL HIGH (ref 0.30–0.70)

## 2018-06-23 LAB — SODIUM, URINE, RANDOM: Sodium, Ur: 41 mmol/L

## 2018-06-23 LAB — APTT: aPTT: 84 seconds — ABNORMAL HIGH (ref 24–36)

## 2018-06-23 LAB — GLUCOSE, CAPILLARY: Glucose-Capillary: 128 mg/dL — ABNORMAL HIGH (ref 70–99)

## 2018-06-23 LAB — CBG MONITORING, ED: Glucose-Capillary: 170 mg/dL — ABNORMAL HIGH (ref 70–99)

## 2018-06-23 MED ORDER — ACETAMINOPHEN 325 MG PO TABS: 650.0000 mg | ORAL_TABLET | Freq: Four times a day (QID) | ORAL | Status: DC | PRN

## 2018-06-23 MED ORDER — HYDRALAZINE HCL 25 MG PO TABS: 25.0000 mg | ORAL_TABLET | Freq: Three times a day (TID) | ORAL | 0 refills | Status: DC

## 2018-06-23 MED ORDER — ACETAMINOPHEN 650 MG RE SUPP: 650.0000 mg | Freq: Four times a day (QID) | RECTAL | Status: DC | PRN

## 2018-06-23 MED ORDER — HEPARIN (PORCINE) 25000 UT/250ML-% IV SOLN
1350.0000 [IU]/h | INTRAVENOUS | Status: DC
  Administered 2018-06-23: 04:00:00 1350 [IU]/h via INTRAVENOUS
  Filled 2018-06-23: qty 250

## 2018-06-23 MED ORDER — APIXABAN 2.5 MG PO TABS: 2.5000 mg | ORAL_TABLET | Freq: Two times a day (BID) | ORAL | 0 refills | Status: DC

## 2018-06-23 MED ORDER — ONDANSETRON HCL 4 MG PO TABS: 4.0000 mg | ORAL_TABLET | Freq: Four times a day (QID) | ORAL | Status: DC | PRN

## 2018-06-23 MED ORDER — ONDANSETRON HCL 4 MG/2ML IJ SOLN: 4.0000 mg | Freq: Four times a day (QID) | INTRAMUSCULAR | Status: DC | PRN

## 2018-06-23 MED ORDER — SODIUM CHLORIDE 0.9 % IV SOLN
INTRAVENOUS | Status: DC
  Administered 2018-06-23: 04:00:00 via INTRAVENOUS

## 2018-06-23 MED ORDER — FUROSEMIDE 40 MG PO TABS: 40.0000 mg | ORAL_TABLET | Freq: Every day | ORAL | 2 refills | Status: DC

## 2018-06-23 MED ORDER — INSULIN ASPART 100 UNIT/ML ~~LOC~~ SOLN
0.0000 [IU] | Freq: Three times a day (TID) | SUBCUTANEOUS | Status: DC
  Administered 2018-06-23: 1 [IU] via SUBCUTANEOUS

## 2018-06-23 MED ORDER — METOPROLOL TARTRATE 25 MG PO TABS
25.0000 mg | ORAL_TABLET | Freq: Two times a day (BID) | ORAL | Status: DC
  Administered 2018-06-23: 09:00:00 25 mg via ORAL
  Filled 2018-06-23: qty 1

## 2018-06-23 MED ORDER — POTASSIUM CHLORIDE CRYS ER 20 MEQ PO TBCR: 20.0000 meq | EXTENDED_RELEASE_TABLET | Freq: Every day | ORAL | 10 refills | Status: DC

## 2018-06-23 NOTE — Care Management CC44 (Signed)
Condition Code 44 Documentation Completed  Patient Details  Name: Jesus Oconnor MRN: [id number] Date of Birth: 1933/12/28   Condition Code 44 given:  Yes Patient signature on Condition Code 44 notice:  Yes Documentation of 2 MD's agreement:  Yes Code 44 added to claim:  Yes    Carles Collet, RN 06/23/2018, 11:55 AM

## 2018-06-23 NOTE — Care Management Obs Status (Signed)
[place] NOTIFICATION   Patient Details  Name: Jesus Oconnor MRN: [id number] Date of Birth: 1933-11-21   Medicare Observation Status Notification Given:  Yes    Carles Collet, RN 06/23/2018, 11:55 AM

## 2018-06-23 NOTE — Discharge Instructions (Signed)
Follow with Primary MD Vernie Shanks, MD in 7 days   Get CBC, BMP, UA  checked  by Primary MD  in 5-7 days    Activity: As tolerated with Full fall precautions use walker/cane & assistance as needed  Disposition Home   Diet: Heart Healthy Low Carb, 1.5 L/day fluid restriction.  Special Instructions: If you have smoked or chewed Tobacco  in the last 2 yrs please stop smoking, stop any regular Alcohol  and or any Recreational drug use.  On your next visit with your primary care physician please Get Medicines reviewed and adjusted.  Please request your Prim.MD to go over all Hospital Tests and Procedure/Radiological results at the follow up, please get all Hospital records sent to your Prim MD by signing hospital release before you go home.  If you experience worsening of your admission symptoms, develop shortness of breath, life threatening emergency, suicidal or homicidal thoughts you must seek medical attention immediately by calling 911 or calling your MD immediately  if symptoms less severe.  You Must read complete instructions/literature along with all the possible adverse reactions/side effects for all the Medicines you take and that have been prescribed to you. Take any new Medicines after you have completely understood and accpet all the possible adverse reactions/side effects.

## 2018-06-23 NOTE — Discharge Summary (Signed)
Jesus Oconnor EKB:[id number] DOB: 12/21/1933 DOA: 06/22/2018  PCP: Vernie Shanks, MD  Admit date: 06/22/2018  Discharge date: 06/23/2018  Admitted From: Home  Disposition:  Home   Recommendations for Outpatient Follow-up:   Follow up with PCP in 1-2 weeks  PCP Please obtain BMP/CBC, 2 view CXR in 1week,  (see Discharge instructions)   PCP Please follow up on the following pending results: BMP in 1 week, get Eliquis dose adjusted   Home Health: None   Equipment/Devices: None  Consultations: None Discharge Condition: Satble   CODE STATUS: Full   Diet Recommendation: Heart Healthy Low Carb, 1.5lit/day fluid restriction    Chief Complaint  Patient presents with  . Abnormal Lab     Brief history of present illness from the day of admission and additional interim summary    Jesus Oconnor is a 85 y.o. male with medical history significant of DM, HTN, COPD with chronic cough, chronic systolic CHF due to ICM with EF 40-45% in Jan this year, CHB s/p PPM.  Patient sent in to ED by PCP due to worsening creatinine on labs.  He has no symptoms.  Creat was 1.25 in Nov 2019. HGB 9.3 which is lower than baseline, but hemoccult was negative in PCPs office.  Creat 2.8, BUN 61.                                                                  Hospital Course    CKD stage III.  Likely due to combination of him being on Lasix and ACE inhibitor, renal ultrasound nonacute but does show changes consistent with chronic kidney disease, he has good urine output, no signs of uremia, after gentle hydration and holding of the offending medications renal function has improved, he is eager to go home, with current ongoing covered pandemic will defer further work-up and monitoring to the outpatient setting.  Will be discharged  home with ACE inhibitor discontinued, will add hydralazine instead.  Hold Lasix for another 3 days.  Follow with PCP in a week for repeat BMP.  Consider outpatient nephrology follow-up.  Chronic systolic heart failure EF 45%.  Currently compensated.  No changes done above.  HX of atrial flutter.  Mali vas 2 score of at least 4.  Eliquis dose reduced due to worsening renal function, request PCP to monitor BMP and adjust Eliquis dose as needed, continue home medications otherwise unchanged.  His other chronic medical problems are stable which are hypertension, DM type II and COPD no other changes were made in home medications except as in #1 above.   Discharge diagnosis     Principal Problem:   AKI (acute kidney injury) ([place]) Active Problems:   DM2 (diabetes mellitus, type 2) (HCC)   Atrial flutter (HCC)   Cardiomyopathy, ischemic  Essential hypertension   Chronic systolic CHF (congestive heart failure) (HCC)   COPD (chronic obstructive pulmonary disease) ([place])    Discharge instructions    Discharge Instructions    Diet - low sodium heart healthy   Complete by:  As directed    Discharge instructions   Complete by:  As directed    Follow with Primary MD Vernie Shanks, MD in 7 days   Get CBC, BMP, UA  checked  by Primary MD  in 5-7 days    Activity: As tolerated with Full fall precautions use walker/cane & assistance as needed  Disposition Home   Diet: Heart Healthy Low Carb, 1.5 L/day fluid restriction.  Special Instructions: If you have smoked or chewed Tobacco  in the last 2 yrs please stop smoking, stop any regular Alcohol  and or any Recreational drug use.  On your next visit with your primary care physician please Get Medicines reviewed and adjusted.  Please request your Prim.MD to go over all Hospital Tests and Procedure/Radiological results at the follow up, please get all Hospital records sent to your Prim MD by signing hospital release before you go home.  If  you experience worsening of your admission symptoms, develop shortness of breath, life threatening emergency, suicidal or homicidal thoughts you must seek medical attention immediately by calling 911 or calling your MD immediately  if symptoms less severe.  You Must read complete instructions/literature along with all the possible adverse reactions/side effects for all the Medicines you take and that have been prescribed to you. Take any new Medicines after you have completely understood and accpet all the possible adverse reactions/side effects.   Increase activity slowly   Complete by:  As directed       Discharge Medications   Allergies as of 06/23/2018      Reactions   No Known Allergies       Medication List    STOP taking these medications   lisinopril 20 MG tablet Commonly known as:  ZESTRIL     TAKE these medications   amLODipine 10 MG tablet Commonly known as:  NORVASC Take 1 tablet (10 mg total) by mouth daily.   apixaban 2.5 MG Tabs tablet Commonly known as:  Eliquis Take 1 tablet (2.5 mg total) by mouth 2 (two) times daily. TAKE 1 TABLET(5 MG) BY MOUTH TWICE DAILY What changed:    medication strength  how much to take  how to take this  when to take this   B-12 1000 MCG/ML Kit Inject 1 Dose as directed every 30 (thirty) days.   ezetimibe 10 MG tablet Commonly known as:  ZETIA Take 10 mg by mouth daily.   fluticasone furoate-vilanterol 200-25 MCG/INH Aepb Commonly known as:  BREO ELLIPTA Inhale 1 puff into the lungs 2 (two) times daily.   furosemide 40 MG tablet Commonly known as:  LASIX Take 1 tablet (40 mg total) by mouth daily. Start taking on:  June 26, 2018 What changed:  These instructions start on June 26, 2018. If you are unsure what to do until then, ask your doctor or other care provider.   hydrALAZINE 25 MG tablet Commonly known as:  APRESOLINE Take 1 tablet (25 mg total) by mouth 3 (three) times daily for 30 days.   metFORMIN 500 MG  tablet Commonly known as:  GLUCOPHAGE Take 1,000 mg by mouth 2 (two) times daily with a meal.   metoprolol tartrate 25 MG tablet Commonly known as:  LOPRESSOR TAKE 1 TABLET  BY MOUTH TWO  TIMES DAILY   OneTouch Verio test strip Generic drug:  glucose blood daily. As directed   potassium chloride SA 20 MEQ tablet Commonly known as:  K-DUR Take 1 tablet (20 mEq total) by mouth daily. Start taking on:  June 26, 2018 What changed:  These instructions start on June 26, 2018. If you are unsure what to do until then, ask your doctor or other care provider.   traZODone 50 MG tablet Commonly known as:  DESYREL Take 50 mg by mouth at bedtime as needed for sleep.       Follow-up Information    Vernie Shanks, MD. Schedule an appointment as soon as possible for a visit in 1 week(s).   Specialty:  Family Medicine Contact information: [place] [address] [phone]        Pixie Casino, MD .   Specialty:  Cardiology Contact information: [address] [place] [address] [phone]           Major procedures and Radiology Reports - PLEASE review detailed and final reports thoroughly  -       Dg Chest 2 View  Result Date: 06/23/2018 CLINICAL DATA:  Cough EXAM: CHEST - 2 VIEW COMPARISON:  01/18/2018 FINDINGS: Cardiac shadow is mildly prominent. Pacing device and postsurgical changes are again seen. The well aerated bilaterally. No focal infiltrate or sizable effusion is seen. No acute bony abnormality is noted. IMPRESSION: No acute abnormality noted. Electronically Signed   By: Inez Catalina M.D.   On: 06/23/2018 02:55   US Renal  Result Date: 06/23/2018 CLINICAL DATA:  85 y/o  M; acute kidney injury. EXAM: RENAL / URINARY TRACT ULTRASOUND COMPLETE COMPARISON:  12/20/2010 abdomen MRI. FINDINGS: Right Kidney: Renal measurements: 9.2 x 6.0 x 5.4 cm = volume: 155 mL. Echogenic renal parenchyma. No focal mass or hydronephrosis. Trace  perinephric fluid. Left Kidney: Renal measurements: 11.1 x 6.2 x 5.7 cm = volume: 205 mL. Normal echogenicity of renal parenchyma. No mass or hydronephrosis. Trace perinephric fluid. Bladder: Appears normal for degree of bladder distention. Bilateral ureteral jets noted. IMPRESSION: 1. Asymmetric echogenic and atrophic right kidney compatible with medical renal disease. No mass or hydronephrosis identified. 2. Trace bilateral nonspecific perinephric fluid, probably related to acute kidney injury. Electronically Signed   By: Kristine Garbe M.D.   On: 06/23/2018 02:24    Micro Results     No results found for this or any previous visit (from the past 240 hour(s)).  Today   Subjective    Jesus Oconnor today has no headache,no chest abdominal pain,no new weakness tingling or numbness, feels much better wants to go home today.     Objective   Blood pressure 137/68, pulse 78, temperature 97.8 F (36.6 C), temperature source Oral, resp. rate 13, height 6' (1.829 m), weight 97 kg, SpO2 96 %.   Intake/Output Summary (Last 24 hours) at 06/23/2018 1018 Last data filed at 06/23/2018 0818 Gross per 24 hour  Intake 445.72 ml  Output 1070 ml  Net -624.28 ml    Exam  Awake Alert, Oriented x 3, No new F.N deficits, Normal affect [place].AT,PERRAL Supple Neck,No JVD, No cervical lymphadenopathy appriciated.  Symmetrical Chest wall movement, Good air movement bilaterally, CTAB RRR,No Gallops,Rubs or new Murmurs, No Parasternal Heave +ve B.Sounds, Abd Soft, Non tender, No organomegaly appriciated, No rebound -guarding or rigidity. No Cyanosis, Clubbing or edema, No new Rash or bruise   Data Review  CBC w Diff:  Lab Results  Component Value Date   WBC 5.9 06/22/2018   HGB 9.3 (L) 06/22/2018   HCT 29.1 (L) 06/22/2018   PLT 158 06/22/2018   LYMPHOPCT 15 09/30/2016   MONOPCT 12 09/30/2016   EOSPCT 2 09/30/2016   BASOPCT 0 09/30/2016    CMP:  Lab Results  Component Value Date    NA 140 06/23/2018   K 4.6 06/23/2018   CL 110 06/23/2018   CO2 18 (L) 06/23/2018   BUN 56 (H) 06/23/2018   CREATININE 2.56 (H) 06/23/2018   CREATININE 0.86 08/19/2015   PROT 7.2 06/23/2018   ALBUMIN 4.0 06/23/2018   BILITOT 0.8 06/23/2018   ALKPHOS 37 (L) 06/23/2018   AST 12 (L) 06/23/2018   ALT 11 06/23/2018  .   Total Time in preparing paper work, data evaluation and todays exam - 32 minutes  Lala Lund M.D on 06/23/2018 at 10:18 AM  [facility]   Office  [phone]

## 2018-06-23 NOTE — ED Notes (Signed)
ED TO INPATIENT HANDOFF REPORT  ED Nurse Name and Phone #: Gifford Shave [phone]  S Name/Age/Gender Merry Proud 85 y.o. male Room/Bed: 034C/034C  Code Status   Code Status: Full Code  Home/SNF/Other Home Patient oriented to: self, place, time and situation Is this baseline? Yes   Triage Complete: Triage complete  Chief Complaint Abnormal Labs  Triage Note Pt states PCP sent him here due to worsening kidney function.    Allergies Allergies  Allergen Reactions  . No Known Allergies     Level of Care/Admitting Diagnosis ED Disposition    ED Disposition Condition Comment   Admit  Hospital Area: [facility] [[id number]]  Level of Care: Med-Surg [16]  Covid Evaluation: N/A  Diagnosis: AKI (acute kidney injury) [facility]) [[id number]]  Admitting Physician: Doreatha Massed  Attending Physician: Etta Quill [phone]  Estimated length of stay: past midnight tomorrow  Certification:: I certify this patient will need inpatient services for at least 2 midnights  PT Class (Do Not Modify): Inpatient [101]  PT Acc Code (Do Not Modify): Private [1]       B Medical/Surgery History Past Medical History:  Diagnosis Date  . Anemia    history of  . Arthritis   . Atrial flutter (HCC)    severe bradycardia  . CHF (congestive heart failure) ([place])   . Constipation   . COPD (chronic obstructive pulmonary disease) ([place])   . Coronary artery disease   . Cough   . Diarrhea   . Diastolic dysfunction    Echo 05/09/3612-ERXVQ I diastolic dysfunction,EF 00-86%  . DM2 (diabetes mellitus, type 2) ([place])   . Dyslipidemia   . History of kidney stones   . HTN (hypertension)   . Hyperlipidemia   . Leg swelling   . Mobitz type 1 second degree atrioventricular block   . Pacemaker 05/22/2012   Dr  Croitoru    Medtronic Adapta  . 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 SVG to firts & second diagonals,SVG to forth posterolateral branch to RCA   . Sore throat   . Wheezing    Past Surgical History:  Procedure Laterality Date  . CABG x5  09/13/2010   Dr Farrel Conners to LAD,SVG to obtuse marginal,seq. SVG to first & second diagonals,SVG to fourth posterolateral branch RCA  . CARDIAC CATHETERIZATION  09/10/2010   severe LAD,D1,D2 & ostial D2  . CARDIAC CATHETERIZATION N/A 08/28/2015   Procedure: Right/Left Heart Cath and Coronary/Graft Angiography;  Surgeon: Troy Sine, MD;  Location: [facility];  Service: Cardiovascular;  Laterality: N/A;  . CHOLECYSTECTOMY  october 2012  . CORONARY ARTERY BYPASS GRAFT    . INSERT / REPLACE / REMOVE PACEMAKER  05/22/2012    dual chamber  . PERMANENT PACEMAKER INSERTION N/A 05/22/2012   Procedure: PERMANENT PACEMAKER INSERTION;  Surgeon: Sanda Klein, MD;  Location: [facility];  Service: Cardiovascular;  Laterality: N/A;  . repair of left indirect inguinal hernia with mesh.    . repair of umbilical hernia.    Marland Kitchen TOTAL KNEE ARTHROPLASTY Right 09/26/2016   Procedure: TOTAL KNEE ARTHROPLASTY;  Surgeon: Frederik Pear, MD;  Location: [facility];  Service: Orthopedics;  Laterality: Right;     A IV Location/Drains/Wounds Patient Lines/Drains/Airways Status   Active Line/Drains/Airways    Name:   Placement date:   Placement time:   Site:   Days:   Incision (Closed) 09/26/16 Knee Right   09/26/16  0981     635          Intake/Output Last 24 hours No intake or output data in the 24 hours ending 06/23/18 0254  Labs/Imaging Results for orders placed or performed during the hospital encounter of 06/22/18 (from the past 48 hour(s))  Basic metabolic panel     Status: Abnormal   Collection Time: 06/22/18  6:13 PM  Result Value Ref Range   Sodium 138 135 - 145 mmol/L   Potassium 5.1 3.5 - 5.1 mmol/L   Chloride 107 98 - 111 mmol/L   CO2 22 22 - 32 mmol/L   Glucose, Bld 98 70 - 99 mg/dL   BUN 61 (H) 8 - 23 mg/dL   Creatinine, Ser 2.81 (H) 0.61 - 1.24 mg/dL   Calcium 9.5 8.9 - 10.3 mg/dL   GFR  calc non Af Amer 20 (L) >60 mL/min   GFR calc Af Amer 23 (L) >60 mL/min   Anion gap 9 5 - 15    Comment: Performed at [facility], [address] [place], [place] [address]  CBC     Status: Abnormal   Collection Time: 06/22/18  6:13 PM  Result Value Ref Range   WBC 5.9 4.0 - 10.5 K/uL   RBC 2.94 (L) 4.22 - 5.81 MIL/uL   Hemoglobin 9.3 (L) 13.0 - 17.0 g/dL   HCT 29.1 (L) 39.0 - 52.0 %   MCV 99.0 80.0 - 100.0 fL   MCH 31.6 26.0 - 34.0 pg   MCHC 32.0 30.0 - 36.0 g/dL   RDW 12.7 11.5 - 15.5 %   Platelets 158 150 - 400 K/uL   nRBC 0.0 0.0 - 0.2 %    Comment: Performed at [facility], [place] [address] [place], [place] [address]  CBG monitoring, ED     Status: Abnormal   Collection Time: 06/23/18  1:25 AM  Result Value Ref Range   Glucose-Capillary 170 (H) 70 - 99 mg/dL  Urinalysis, Routine w reflex microscopic     Status: Abnormal   Collection Time: 06/23/18  2:10 AM  Result Value Ref Range   Color, Urine YELLOW YELLOW   APPearance CLOUDY (A) CLEAR   Specific Gravity, Urine 1.011 1.005 - 1.030   pH 5.0 5.0 - 8.0   Glucose, UA NEGATIVE NEGATIVE mg/dL   Hgb urine dipstick MODERATE (A) NEGATIVE   Bilirubin Urine NEGATIVE NEGATIVE   Ketones, ur NEGATIVE NEGATIVE mg/dL   Protein, ur 30 (A) NEGATIVE mg/dL   Nitrite NEGATIVE NEGATIVE   Leukocytes,Ua NEGATIVE NEGATIVE   RBC / HPF 0-5 0 - 5 RBC/hpf   WBC, UA 0-5 0 - 5 WBC/hpf   Bacteria, UA RARE (A) NONE SEEN   Squamous Epithelial / LPF 0-5 0 - 5   Mucus PRESENT    Hyaline Casts, UA PRESENT    Amorphous Crystal PRESENT     Comment: Performed at [facility], [address], [place], [place] [address]  Sodium, urine, random     Status: None   Collection Time: 06/23/18  2:10 AM  Result Value Ref Range   Sodium, Ur 41 mmol/L    Comment: Performed at [facility] [address] [place], [place] [address]  Creatinine, urine, random     Status: None   Collection Time: 06/23/18  2:10 AM  Result Value Ref Range    Creatinine, Urine 79.95 mg/dL    Comment: Performed at [facility]  [address] [place], [place] [address]   US Renal  Result Date: 06/23/2018 CLINICAL DATA:  85 y/o  M; acute kidney injury. EXAM: RENAL / URINARY TRACT ULTRASOUND COMPLETE COMPARISON:  12/20/2010 abdomen MRI. FINDINGS: Right Kidney: Renal measurements: 9.2 x 6.0 x 5.4 cm = volume: 155 mL. Echogenic renal parenchyma. No focal mass or hydronephrosis. Trace perinephric fluid. Left Kidney: Renal measurements: 11.1 x 6.2 x 5.7 cm = volume: 205 mL. Normal echogenicity of renal parenchyma. No mass or hydronephrosis. Trace perinephric fluid. Bladder: Appears normal for degree of bladder distention. Bilateral ureteral jets noted. IMPRESSION: 1. Asymmetric echogenic and atrophic right kidney compatible with medical renal disease. No mass or hydronephrosis identified. 2. Trace bilateral nonspecific perinephric fluid, probably related to acute kidney injury. Electronically Signed   By: Kristine Garbe M.D.   On: 06/23/2018 02:24    Pending Labs Unresulted Labs (From admission, onward)    Start     Ordered   06/24/18 0500  CBC  Daily,   R     06/23/18 0210   06/24/18 0500  Heparin level (unfractionated)  Daily,   R     06/23/18 0210   06/24/18 0500  APTT  Daily,   R     06/23/18 0210   06/23/18 1000  Heparin level (unfractionated)  Once-Timed,   R     06/23/18 0210   06/23/18 1000  APTT  Once-Timed,   R     06/23/18 0210   06/23/18 0202  Hepatic function panel  Once,   R     06/23/18 0201          Vitals/Pain Today's Vitals   06/23/18 0004 06/23/18 0019 06/23/18 0045 06/23/18 0142  BP: (!) 130/45  135/64   Pulse: 74  (!) 55   Resp: 16  15   Temp: 97.8 F (36.6 C)     TempSrc: Oral     SpO2: 100%  97%   Weight:      Height:      PainSc:  0-No pain  0-No pain    Isolation Precautions No active isolations  Medications Medications  acetaminophen (TYLENOL) tablet 650 mg (has no administration in  time range)    Or  acetaminophen (TYLENOL) suppository 650 mg (has no administration in time range)  ondansetron (ZOFRAN) tablet 4 mg (has no administration in time range)    Or  ondansetron (ZOFRAN) injection 4 mg (has no administration in time range)  metoprolol tartrate (LOPRESSOR) tablet 25 mg (has no administration in time range)  heparin ADULT infusion 100 units/mL (25000 units/220mL sodium chloride 0.45%) (has no administration in time range)  0.9 %  sodium chloride infusion (has no administration in time range)  insulin aspart (novoLOG) injection 0-9 Units (has no administration in time range)    Mobility walks Low fall risk   Focused Assessments   R Recommendations: See Admitting Provider Note  Report given to:   Additional Notes:

## 2018-06-23 NOTE — Progress Notes (Signed)
Discharge instructions including but not limited to f/u appointments, meds, daily wts, fluid limit & etc given to pt. Pt was given discharge instructions as well as paper prescriptions. All questions were answered. Pt's IV, continuous pulse ox as well as telemetry were removed. All of the pt's belongings were gathered  & sent with him. Hoover Brunette, RN

## 2018-06-23 NOTE — Progress Notes (Signed)
ANTICOAGULATION CONSULT NOTE - Initial Consult  Pharmacy Consult for heparin Indication: atrial fibrillation  Allergies  Allergen Reactions  . No Known Allergies     Patient Measurements: Height: 6' (182.9 cm) Weight: 214 lb (97.1 kg) IBW/kg (Calculated) : 77.6 Heparin Dosing Weight: 97 kg  Vital Signs: Temp: 97.8 F (36.6 C) (04/18 0004) Temp Source: Oral (04/18 0004) BP: 135/64 (04/18 0045) Pulse Rate: 55 (04/18 0045)  Labs: Recent Labs    06/22/18 1813  HGB 9.3*  HCT 29.1*  PLT 158  CREATININE 2.81*    Estimated Creatinine Clearance: 24.1 mL/min (A) (by C-G formula based on SCr of 2.81 mg/dL (H)).   Medical History: Past Medical History:  Diagnosis Date  . Anemia    history of  . Arthritis   . Atrial flutter (HCC)    severe bradycardia  . CHF (congestive heart failure) ([place])   . Constipation   . COPD (chronic obstructive pulmonary disease) ([place])   . Coronary artery disease   . Cough   . Diarrhea   . Diastolic dysfunction    Echo 03/13/107-NATFT I diastolic dysfunction,EF 73-22%  . DM2 (diabetes mellitus, type 2) ([place])   . Dyslipidemia   . History of kidney stones   . HTN (hypertension)   . Hyperlipidemia   . Leg swelling   . Mobitz type 1 second degree atrioventricular block   . Pacemaker 05/22/2012   Dr  Croitoru    Medtronic Adapta  . 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 SVG to firts & second diagonals,SVG to forth posterolateral branch to RCA  . Sore throat   . Wheezing     Medications:  eliquis 5mg  po bid, last dose 4/17 08:00  Assessment: 85 yo man to start heparin while eliquis on hold.  Baseline Hg 9.3, PTLC 158 Goal of Therapy:  aptt  66-102 sec Heparin level 0.3-0.7 units/ml Monitor platelets by anticoagulation protocol: Yes   Plan:  Start heparin with no bolus at 1350 units/hr Check heparin level and aPTT 6-8 hours after start then daily Monitor for bleeding complications  Thanks for allowing  pharmacy to be a part of this patient's care.  Excell Seltzer, PharmD Clinical Pharmacist 06/23/2018,2:12 AM

## 2018-06-23 NOTE — Progress Notes (Signed)
Mr Jesus Oconnor 85 y o patient admitted from ED. Patient is alert and oriented x4. Vital signs are stable. Iv in place and running fluid. Patient is on telemetry. Skin assessment done with another nurse. Patient given instructions about call bell and phone. Bed in low position and call bell in reach.

## 2018-06-23 NOTE — Care Management (Signed)
Requested UR to review for appropriateness of bed status at request of attending. Awaiting response.

## 2018-06-23 NOTE — H&P (Signed)
History and Physical    Jesus Oconnor TKZ:[id number] DOB: [birth year] DOA: 06/22/2018  PCP: Vernie Shanks, MD  Patient coming from: Home  I have personally briefly reviewed patient's old medical records in [place]  Chief Complaint: Worsening kidney function  HPI: Jesus Oconnor is a 85 y.o. male with medical history significant of DM, HTN, COPD with chronic cough, chronic systolic CHF due to ICM with EF 40-45% in Jan this year, CHB s/p PPM.  Patient sent in to ED by PCP due to worsening creatinine on labs.  He has no symptoms.  Creat was 1.25 in Nov 2019.  Only new med since then is Eliquis started in Feb, they report they are concerned that this is causing the AKI.  ED Course: HGB 9.3 which is lower than baseline, but hemoccult was negative in PCPs office.  Creat 2.8, BUN 61.   Review of Systems: As per HPI otherwise 10 point review of systems negative.   Past Medical History:  Diagnosis Date  . Anemia    history of  . Arthritis   . Atrial flutter (HCC)    severe bradycardia  . CHF (congestive heart failure) ([place])   . Constipation   . COPD (chronic obstructive pulmonary disease) ([place])   . Coronary artery disease   . Cough   . Diarrhea   . Diastolic dysfunction    Echo 07/12/3218-URKYH I diastolic dysfunction,EF 06-23%  . DM2 (diabetes mellitus, type 2) ([place])   . Dyslipidemia   . History of kidney stones   . HTN (hypertension)   . Hyperlipidemia   . Leg swelling   . Mobitz type 1 second degree atrioventricular block   . Pacemaker 05/22/2012   Dr  Croitoru    Medtronic Adapta  . 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 SVG to firts & second diagonals,SVG to forth posterolateral branch to RCA  . Sore throat   . Wheezing     Past Surgical History:  Procedure Laterality Date  . CABG x5  09/13/2010   Dr Farrel Conners to LAD,SVG to obtuse marginal,seq. SVG to first & second diagonals,SVG to fourth posterolateral branch RCA  . CARDIAC  CATHETERIZATION  09/10/2010   severe LAD,D1,D2 & ostial D2  . CARDIAC CATHETERIZATION N/A 08/28/2015   Procedure: Right/Left Heart Cath and Coronary/Graft Angiography;  Surgeon: [place] Sine, MD;  Location: [facility];  Service: Cardiovascular;  Laterality: N/A;  . CHOLECYSTECTOMY  october 2012  . CORONARY ARTERY BYPASS GRAFT    . INSERT / REPLACE / REMOVE PACEMAKER  05/22/2012    dual chamber  . PERMANENT PACEMAKER INSERTION N/A 05/22/2012   Procedure: PERMANENT PACEMAKER INSERTION;  Surgeon: Sanda Klein, MD;  Location: [facility];  Service: Cardiovascular;  Laterality: N/A;  . repair of left indirect inguinal hernia with mesh.    . repair of umbilical hernia.    Marland Kitchen TOTAL KNEE ARTHROPLASTY Right 09/26/2016   Procedure: TOTAL KNEE ARTHROPLASTY;  Surgeon: Frederik Pear, MD;  Location: [facility];  Service: Orthopedics;  Laterality: Right;     reports that he quit smoking about 7 years ago. His smoking use included cigarettes. He quit after 40.00 years of use. He has never used smokeless tobacco. He reports current alcohol use. He reports current drug use. Drug: Marijuana.  Allergies  Allergen Reactions  . No Known Allergies     Family History  Problem Relation Age of Onset  . Heart disease Father   .  Heart disease Brother        s/p cabg  . Heart disease Maternal Uncle      Prior to Admission medications   Medication Sig Start Date End Date Taking? Authorizing Provider  amLODipine (NORVASC) 10 MG tablet Take 1 tablet (10 mg total) by mouth daily. 01/10/18   Croitoru, Mihai, MD  apixaban (ELIQUIS) 5 MG TABS tablet TAKE 1 TABLET(5 MG) BY MOUTH TWICE DAILY 04/26/18   Hilty, Nadean Corwin, MD  Cyanocobalamin (B-12) 1000 MCG/ML KIT Inject 1 Dose as directed every 30 (thirty) days.     [provider]  ezetimibe (ZETIA) 10 MG tablet Take 10 mg by mouth daily.    [provider]  fluticasone furoate-vilanterol (BREO ELLIPTA) 200-25 MCG/INH AEPB Inhale 1 puff into the lungs  2 (two) times daily.    [provider]  furosemide (LASIX) 40 MG tablet TAKE 1 TABLET BY MOUTH  DAILY 05/01/17   Croitoru, Dani Gobble, MD  lisinopril (PRINIVIL,ZESTRIL) 20 MG tablet Take 1 tablet (20 mg total) by mouth 2 (two) times daily. 01/10/18   Croitoru, Mihai, MD  metFORMIN (GLUCOPHAGE) 500 MG tablet Take 1,000 mg by mouth 2 (two) times daily with a meal.    [provider]  metoprolol tartrate (LOPRESSOR) 25 MG tablet TAKE 1 TABLET BY MOUTH TWO  TIMES DAILY 06/11/18   Croitoru, Dani Gobble, MD  [facility] VERIO test strip daily. As directed 07/05/14   [provider]  potassium chloride SA (K-DUR,KLOR-CON) 20 MEQ tablet TAKE 1 TABLET BY MOUTH DAILY 09/01/14   Croitoru, Mihai, MD  traZODone (DESYREL) 50 MG tablet Take 50 mg by mouth at bedtime as needed for sleep.     [provider]    Physical Exam: Vitals:   06/22/18 2030 06/22/18 2257 06/23/18 0004 06/23/18 0045  BP: (!) 115/57 (!) 129/53 (!) 130/45 135/64  Pulse: 73  74 (!) 55  Resp: '16  16 15  '$ Temp: 97.8 F (36.6 C) 97.7 F (36.5 C) 97.8 F (36.6 C)   TempSrc: Oral Oral Oral   SpO2: 100% 98% 100% 97%  Weight:      Height:        Constitutional: NAD, calm, comfortable Eyes: PERRL, lids and conjunctivae normal ENMT: Mucous membranes are moist. Posterior pharynx clear of any exudate or lesions.Normal dentition.  Neck: normal, supple, no masses, no thyromegaly Respiratory: Wet sounding cough. Cardiovascular: Regular rate and rhythm, no murmurs / rubs / gallops. No extremity edema. 2+ pedal pulses. No carotid bruits.  Abdomen: no tenderness, no masses palpated. No hepatosplenomegaly. Bowel sounds positive.  Musculoskeletal: no clubbing / cyanosis. No joint deformity upper and lower extremities. Good ROM, no contractures. Normal muscle tone.  Skin: no rashes, lesions, ulcers. No induration Neurologic: CN 2-12 grossly intact. Sensation intact, DTR normal. Strength 5/5 in all 4.  Psychiatric: Normal judgment  and insight. Alert and oriented x 3. Normal mood.    Labs on Admission: I have personally reviewed following labs and imaging studies  CBC: Recent Labs  Lab 06/22/18 1813  WBC 5.9  HGB 9.3*  HCT 29.1*  MCV 99.0  PLT 834   Basic Metabolic Panel: Recent Labs  Lab 06/22/18 1813  NA 138  K 5.1  CL 107  CO2 22  GLUCOSE 98  BUN 61*  CREATININE 2.81*  CALCIUM 9.5   GFR: Estimated Creatinine Clearance: 24.1 mL/min (A) (by C-G formula based on SCr of 2.81 mg/dL (H)). Liver Function Tests: No results for input(s): AST, ALT, ALKPHOS, BILITOT,  PROT, ALBUMIN in the last 168 hours. No results for input(s): LIPASE, AMYLASE in the last 168 hours. No results for input(s): AMMONIA in the last 168 hours. Coagulation Profile: No results for input(s): INR, PROTIME in the last 168 hours. Cardiac Enzymes: No results for input(s): CKTOTAL, CKMB, CKMBINDEX, TROPONINI in the last 168 hours. BNP (last 3 results) No results for input(s): PROBNP in the last 8760 hours. HbA1C: No results for input(s): HGBA1C in the last 72 hours. CBG: Recent Labs  Lab 06/23/18 0125  GLUCAP 170*   Lipid Profile: No results for input(s): CHOL, HDL, LDLCALC, TRIG, CHOLHDL, LDLDIRECT in the last 72 hours. Thyroid Function Tests: No results for input(s): TSH, T4TOTAL, FREET4, T3FREE, THYROIDAB in the last 72 hours. Anemia Panel: No results for input(s): VITAMINB12, FOLATE, FERRITIN, TIBC, IRON, RETICCTPCT in the last 72 hours. Urine analysis:    Component Value Date/Time   COLORURINE YELLOW 09/30/2016 2120   APPEARANCEUR CLEAR 09/30/2016 2120   LABSPEC 1.017 09/30/2016 2120   PHURINE 5.0 09/30/2016 2120   GLUCOSEU NEGATIVE 09/30/2016 2120   HGBUR NEGATIVE 09/30/2016 2120   [place] NEGATIVE 09/30/2016 2120   [place] NEGATIVE 09/30/2016 2120   PROTEINUR 30 (A) 09/30/2016 2120   UROBILINOGEN 0.2 12/23/2010 2055   NITRITE NEGATIVE 09/30/2016 2120   LEUKOCYTESUR NEGATIVE 09/30/2016 2120     Radiological Exams on Admission: No results found.  EKG: Independently reviewed.  Assessment/Plan Principal Problem:   AKI (acute kidney injury) ([place]) Active Problems:   DM2 (diabetes mellitus, type 2) (HCC)   Atrial flutter (HCC)   Cardiomyopathy, ischemic   Essential hypertension   Chronic systolic CHF (congestive heart failure) (HCC)   COPD (chronic obstructive pulmonary disease) ([place])    1. AKI - 1. Hold lisinopril 2. Hold lasix 3. Gently hydrate: NS at 125 4. Strict intake and output 5. Renal US 6. UA 7. Urine lytes 8. Will hold eliquis and put on heparin gtt instead, (just in case), though eliquis isnt a med I would typically associated as the CAUSE of deteriorating kidney function. 9. Consult nephro in AM most likely 2. Chronic systolic CHF - 1. Doesn't seem to be in exacerbation at this point 2. Getting CXR anyhow given the wet sounding cough (though he says this is baseline). 3. Watch for fluid overload with IVF resuscitation 3. HTN - 1. Continue metoprolol and amlodipine once med rec complete 4. A.Flutter 1. Cont metoprolol for rate control 2. Heparin gtt instead of eliquis due to AKI 5. DM2 - 1. Hold metformin 2. Sensitive SSI AC 6. COPD - 1. Cont home nebs one med rec complete 2. CXR pending  DVT prophylaxis: Heparin gtt Code Status: Full Family Communication: No family in room Disposition Plan: Home after admit Consults called: None Admission status: Admit to inpatient  Severity of Illness: The appropriate patient status for this patient is INPATIENT. Inpatient status is judged to be reasonable and necessary in order to provide the required intensity of service to ensure the patient's safety. The patient's presenting symptoms, physical exam findings, and initial radiographic and laboratory data in the context of their chronic comorbidities is felt to place them at high risk for further clinical deterioration. Furthermore, it is not anticipated  that the patient will be medically stable for discharge from the hospital within 2 midnights of admission. The following factors support the patient status of inpatient.   IP status for treatment of AKI with creat up to 2.8 from 1.25 baseline.  * I certify that at the point  of admission it is my clinical judgment that the patient will require inpatient hospital care spanning beyond 2 midnights from the point of admission due to high intensity of service, high risk for further deterioration and high frequency of surveillance required.*    Kristian Hazzard M. DO [facility]  How to contact the [facility] Attending or Consulting provider [place] or covering provider during after hours [place], for this patient?  1. Check the care team in [facility] and look for a) attending/consulting TR[facility] provider listed and b) the [facility] team listed 2. Log into [email or web address]  Amion Physician Scheduling and messaging for groups and whole hospitals  On call and physician scheduling software for group practices, residents, hospitalists and other medical providers for call, clinic, rotation and shift schedules. OnCall Enterprise is a hospital-wide system for scheduling doctors and paging doctors on call. EasyPlot is for scientific plotting and data analysis.  [email or web address]  and use [place]'s universal password to access. If you do not have the password, please contact the hospital operator.  3. Locate the [facility] provider you are looking for under [facility] and page to a number that you can be directly reached. 4. If you still have difficulty reaching the provider, please page the [facility] (Director on Call) for the Hospitalists listed on amion for assistance.  06/23/2018, 2:10 AM

## 2018-06-23 NOTE — ED Notes (Signed)
ED Provider at bedside. 

## 2018-06-23 NOTE — ED Notes (Signed)
Patient transported to X-ray 

## 2018-06-23 NOTE — Progress Notes (Signed)
ANTICOAGULATION CONSULT NOTE - Initial Consult  Pharmacy Consult for heparin Indication: atrial fibrillation  Allergies  Allergen Reactions  . No Known Allergies     Patient Measurements: Height: 6' (182.9 cm) Weight: 213 lb 13.5 oz (97 kg) IBW/kg (Calculated) : 77.6 Heparin Dosing Weight: 97 kg  Vital Signs: Temp: 97.8 F (36.6 C) (04/18 0624) Temp Source: Oral (04/18 0624) BP: 137/68 (04/18 0624) Pulse Rate: 82 (04/18 0924)  Labs: Recent Labs    06/22/18 1813 06/23/18 0820 06/23/18 0923  HGB 9.3*  --   --   HCT 29.1*  --   --   PLT 158  --   --   APTT  --   --  84*  HEPARINUNFRC  --   --  >2.20*  CREATININE 2.81* 2.56*  --     Estimated Creatinine Clearance: 26.4 mL/min (A) (by C-G formula based on SCr of 2.56 mg/dL (H)).   Medical History: Past Medical History:  Diagnosis Date  . Anemia    history of  . Arthritis   . Atrial flutter (HCC)    severe bradycardia  . CHF (congestive heart failure) ([place])   . Constipation   . COPD (chronic obstructive pulmonary disease) ([place])   . Coronary artery disease   . Cough   . Diarrhea   . Diastolic dysfunction    Echo 07/10/3746-OLMBE I diastolic dysfunction,EF 67-54%  . DM2 (diabetes mellitus, type 2) ([place])   . Dyslipidemia   . History of kidney stones   . HTN (hypertension)   . Hyperlipidemia   . Leg swelling   . Mobitz type 1 second degree atrioventricular block   . Pacemaker 05/22/2012   Dr  Croitoru    Medtronic Adapta  . 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 SVG to firts & second diagonals,SVG to forth posterolateral branch to RCA  . Sore throat   . Wheezing     Medications:  eliquis 5mg  po bid, last dose 4/17 08:00  Assessment: 85 yo man to start heparin while eliquis on hold.  Baseline Hg 9.3, PTLC 158  APTT this AM 84 sec, HL is still >2.20 showing effect of previous apixaban. Will continue to adjust based on aPTT until HL and aPTT correlate. No bleeding noted.  Patient  to be discharged back on apixaban.  **WOULD D/C on half of PTA dose (2.5mg  BID) due to AKI**  Goal of Therapy:  aptt  66-102 sec Heparin level 0.3-0.7 units/ml Monitor platelets by anticoagulation protocol: Yes   Plan:  Continue  heparin at 1350 units/hr if continues inpatient.  **IF discharges, would decrease apixaban to 2.5mg  BID for AKI in >78 yo.** Monitor for bleeding complications  Yashika Mask A. Levada Dy, PharmD, [place] Please utilize Amion for appropriate phone number to reach the unit pharmacist ([place])   06/23/2018,10:50 AM

## 2018-06-23 NOTE — Plan of Care (Signed)
  Problem: Education: Goal: Knowledge of General Education information will improve Description Including pain rating scale, medication(s)/side effects and non-pharmacologic comfort measures Outcome: Adequate for Discharge   Problem: Health Behavior/Discharge Planning: Goal: Ability to manage health-related needs will improve Outcome: Adequate for Discharge  Pt is HOH & seems to forget things quickly. Education material given to pt. Per pt his dgtr is pretty smart she'll read over the paper work & call RN if she has any questions.   Problem: Clinical Measurements: Goal: Ability to maintain clinical measurements within normal limits will improve Outcome: Completed/Met Goal: Will remain free from infection Outcome: Completed/Met Goal: Diagnostic test results will improve Outcome: Completed/Met Goal: Respiratory complications will improve Outcome: Completed/Met Goal: Cardiovascular complication will be avoided Outcome: Completed/Met   Problem: Activity: Goal: Risk for activity intolerance will decrease Outcome: Completed/Met   Problem: Nutrition: Goal: Adequate nutrition will be maintained Outcome: Completed/Met   Problem: Coping: Goal: Level of anxiety will decrease Outcome: Completed/Met   Problem: Elimination: Goal: Will not experience complications related to bowel motility Outcome: Completed/Met Goal: Will not experience complications related to urinary retention Outcome: Completed/Met   Problem: Pain Managment: Goal: General experience of comfort will improve Outcome: Completed/Met   Problem: Safety: Goal: Ability to remain free from injury will improve Outcome: Completed/Met   Problem: Skin Integrity: Goal: Risk for impaired skin integrity will decrease Outcome: Completed/Met

## 2018-06-23 NOTE — ED Provider Notes (Signed)
[facility] EMERGENCY DEPARTMENT Provider Note   CSN: [id number] Arrival date & time: 06/22/18  1737    History   Chief Complaint Chief Complaint  Patient presents with  . Abnormal Lab    HPI Jesus Oconnor is a 85 y.o. male.     The history is provided by the patient.  Abnormal Lab  Time since result:  Creatinine Patient presents from home for abnormal labs.  He was seen by his PCP who noted the patient had elevated creatinine.  His course is worsening.  Nothing improves his symptoms.  He denies any fevers/vomiting/diarrhea.  No chest abdominal pain.  It is suspected that it could be due to his Eliquis. No other acute issues at this time.  Past Medical History:  Diagnosis Date  . Anemia    history of  . Arthritis   . Atrial flutter (HCC)    severe bradycardia  . CHF (congestive heart failure) ([place])   . Constipation   . COPD (chronic obstructive pulmonary disease) ([place])   . Coronary artery disease   . Cough   . Diarrhea   . Diastolic dysfunction    Echo 06/10/2701-JKKXF I diastolic dysfunction,EF 81-82%  . DM2 (diabetes mellitus, type 2) ([place])   . Dyslipidemia   . History of kidney stones   . HTN (hypertension)   . Hyperlipidemia   . Leg swelling   . Mobitz type 1 second degree atrioventricular block   . Pacemaker 05/22/2012   Dr  Croitoru    Medtronic Adapta  . 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 SVG to firts & second diagonals,SVG to forth posterolateral branch to RCA  . Sore throat   . Wheezing     Patient Active Problem List   Diagnosis Date Noted  . CHB (complete heart block) ([place]) 07/19/2017  . Venous insufficiency of right lower extremity 12/02/2016  . Primary osteoarthritis of right knee 09/26/2016  . Localized osteoarthritis of right knee 09/16/2016    Class: Chronic  . S/P CABG x 5 11/27/2015  . Chronic systolic CHF (congestive heart failure) ([place]) 09/09/2015  . CAD in native artery   . Other  fatigue 08/19/2015  . Cardiomyopathy, ischemic 08/19/2015  . Essential hypertension 08/19/2015  . Atrial flutter, unspecified 08/19/2015  . NSVT (nonsustained ventricular tachycardia) ([place]) 08/05/2014  . PAT (paroxysmal atrial tachycardia) ([place]) 06/26/2013  . Chronic diastolic heart failure ([place]) 03/25/2013  . Pacemaker 05/23/2012  . CAD (coronary artery disease) 11/15/2010  . Dyslipidemia   . DM2 (diabetes mellitus, type 2) ([place])   . Atrial flutter [facility])     Past Surgical History:  Procedure Laterality Date  . CABG x5  09/13/2010   Dr Farrel Conners to LAD,SVG to obtuse marginal,seq. SVG to first & second diagonals,SVG to fourth posterolateral branch RCA  . CARDIAC CATHETERIZATION  09/10/2010   severe LAD,D1,D2 & ostial D2  . CARDIAC CATHETERIZATION N/A 08/28/2015   Procedure: Right/Left Heart Cath and Coronary/Graft Angiography;  Surgeon: [place] Sine, MD;  Location: [place] [facility];  Service: Cardiovascular;  Laterality: N/A;  . CHOLECYSTECTOMY  october 2012  . CORONARY ARTERY BYPASS GRAFT    . INSERT / REPLACE / REMOVE PACEMAKER  05/22/2012    dual chamber  . PERMANENT PACEMAKER INSERTION N/A 05/22/2012   Procedure: PERMANENT PACEMAKER INSERTION;  Surgeon: Sanda Klein, MD;  Location: [place] CATH LAB;  Service: Cardiovascular;  Laterality: N/A;  . repair of left indirect inguinal hernia with mesh.    Marland Kitchen  repair of umbilical hernia.    Marland Kitchen TOTAL KNEE ARTHROPLASTY Right 09/26/2016   Procedure: TOTAL KNEE ARTHROPLASTY;  Surgeon: Frederik Pear, MD;  Location: [facility] [place];  Service: Orthopedics;  Laterality: Right;        Home Medications    Prior to Admission medications   Medication Sig Start Date End Date Taking? Authorizing Provider  amLODipine (NORVASC) 10 MG tablet Take 1 tablet (10 mg total) by mouth daily. 01/10/18   Croitoru, Mihai, MD  apixaban (ELIQUIS) 5 MG TABS tablet TAKE 1 TABLET(5 MG) BY MOUTH TWICE DAILY 04/26/18   Hilty, Nadean Corwin, MD  Cyanocobalamin (B-12) 1000 MCG/ML KIT  Inject 1 Dose as directed every 30 (thirty) days.     [provider]  ezetimibe (ZETIA) 10 MG tablet Take 10 mg by mouth daily.    [provider]  fluticasone furoate-vilanterol (BREO ELLIPTA) 200-25 MCG/INH AEPB Inhale 1 puff into the lungs 2 (two) times daily.    [provider]  furosemide (LASIX) 40 MG tablet TAKE 1 TABLET BY MOUTH  DAILY 05/01/17   Croitoru, Dani Gobble, MD  lisinopril (PRINIVIL,ZESTRIL) 20 MG tablet Take 1 tablet (20 mg total) by mouth 2 (two) times daily. 01/10/18   Croitoru, Mihai, MD  metFORMIN (GLUCOPHAGE) 500 MG tablet Take 1,000 mg by mouth 2 (two) times daily with a meal.    [provider]  metoprolol tartrate (LOPRESSOR) 25 MG tablet TAKE 1 TABLET BY MOUTH TWO  TIMES DAILY 06/11/18   Croitoru, Dani Gobble, MD  [facility] VERIO test strip daily. As directed 07/05/14   [provider]  potassium chloride SA (K-DUR,KLOR-CON) 20 MEQ tablet TAKE 1 TABLET BY MOUTH DAILY 09/01/14   Croitoru, Mihai, MD  traZODone (DESYREL) 50 MG tablet Take 50 mg by mouth at bedtime as needed for sleep.     [provider]    Family History Family History  Problem Relation Age of Onset  . Heart disease Father   . Heart disease Brother        s/p cabg  . Heart disease Maternal Uncle     Social History Social History   Tobacco Use  . Smoking status: Former Smoker    Years: 40.00    Types: Cigarettes    Last attempt to quit: 08/06/2010    Years since quitting: 7.8  . Smokeless tobacco: Never Used  Substance Use Topics  . Alcohol use: Yes    Comment: 5-6 beers per week  . Drug use: Yes    Types: Marijuana    Comment: couple times a month     Allergies   No known allergies   Review of Systems Review of Systems  Constitutional: Negative for fever.  Respiratory: Negative for cough and shortness of breath.   Cardiovascular: Negative for chest pain.  Gastrointestinal: Negative for vomiting.  All other systems reviewed and are  negative.    Physical Exam Updated Vital Signs BP 135/64   Pulse (!) 55   Temp 97.8 F (36.6 C) (Oral)   Resp 15   Ht 1.829 m (6')   Wt 97.1 kg   SpO2 97%   BMI 29.02 kg/m   Physical Exam CONSTITUTIONAL: elderly HEAD: Normocephalic/atraumatic EYES: EOMI/PERRL ENMT: Mucous membranes moist NECK: supple no meningeal signs SPINE/BACK:entire spine nontender CV: S1/S2 noted, no murmurs/rubs/gallops noted LUNGS: Lungs are clear to auscultation bilaterally, no apparent distress ABDOMEN: soft, nontender, no rebound or guarding, bowel sounds noted throughout abdomen GU:no cva tenderness NEURO: Pt is awake/alert/appropriate, moves all extremitiesx4.  No facial droop.   EXTREMITIES: pulses normal/equal, full ROM SKIN: warm, color normal PSYCH: no abnormalities of mood noted, alert and oriented to situation   ED Treatments / Results  Labs (all labs ordered are listed, but only abnormal results are displayed) Labs Reviewed  BASIC METABOLIC PANEL - Abnormal; Notable for the following components:      Result Value   BUN 61 (*)    Creatinine, Ser 2.81 (*)    GFR calc non Af Amer 20 (*)    GFR calc Af Amer 23 (*)    All other components within normal limits  CBC - Abnormal; Notable for the following components:   RBC 2.94 (*)    Hemoglobin 9.3 (*)    HCT 29.1 (*)    All other components within normal limits    EKG None  Radiology No results found.  Procedures Procedures    Medications Ordered in ED Medications - No data to display   Initial Impression / Assessment and Plan / ED Course  I have reviewed the triage vital signs and the nursing notes.  Pertinent labs   results that were available during my care of the patient were reviewed by me and considered in my medical decision making (see chart for details).        1:05 AM Patient presents for acute change in his creatinine.  I discussed with the daughter via phone.  She reports in November 2019 creatinine  was 1.25 Today is 2.8. He was seen by PCP today who also noted increased anemia, but apparently had a negative rectal exam. It is suspected that his increasing creatinine/kidney injury is due to his Eliquis. We will admit patient for acute renal failure. 2:08 AM Discussed the case with Dr. Alcario Drought for admission Final Clinical Impressions(s) / ED Diagnoses   Final diagnoses:  AKI (acute kidney injury) [facility])  Acute anemia    ED Discharge Orders    None       Ripley Fraise, MD 06/23/18 [phone]

## 2018-06-25 ENCOUNTER — Telehealth: Payer: Self-pay | Admitting: Physician Assistant

## 2018-06-25 ENCOUNTER — Telehealth: Payer: Self-pay | Admitting: Cardiovascular Disease

## 2018-06-25 DIAGNOSIS — I255 Ischemic cardiomyopathy: Secondary | ICD-10-CM

## 2018-06-25 LAB — GLUCOSE, CAPILLARY: Glucose-Capillary: 136 mg/dL — ABNORMAL HIGH (ref 70–99)

## 2018-06-25 NOTE — Telephone Encounter (Signed)
[name] patient's daughter would like to speak to nurse about her father's medications, she wants to know if he should still be taking Eliquis.

## 2018-06-25 NOTE — Telephone Encounter (Signed)
Called and spoke with pt's daughter (with permission) about medication changes. He was sent to ER by PCP for worsening renal function. Discharge creatinine was 2.56 and normal potassium. He was instructed to hold lasix for three days and decrease eliquis to 2.5 mg BID. In addition, he had a Hb in the 9's with negative hemeoccult at PCP office. Pt daughter also states that he takes [facility] regularly for headache. He has no signs of active bleeding. He does not sound fluid overloaded.  After a long discussion I advised: 1. Agree with taking 2.5 mg eliquis BID (after consulting with our pharmacy staff) 2. I advised to stop BC powders, especially in the setting of lower Hb 3. Agree with starting hydralazine TID 25mg  and holding lisinopril 4. Start lasix 40 mg qAM tomorrow 5. Given renal function, will decrease potassium supplementation to 49mEq daily until repeat labs 6. Obtain repeat labs at [facility] on Thursday morning  I will send this note to Dr. Debara Pickett.

## 2018-06-25 NOTE — Telephone Encounter (Signed)
I saw Jesus Oconnor got to it before me and I agree with her recommendations. Please reassure them that I looked over the data and came to the same conclusion. MCr

## 2018-06-25 NOTE — Telephone Encounter (Signed)
Pt was admitted to the hospital on the 17th after being seen by his PCP Dr. Jacelyn Grip who sent pt to the ED d/t lab values and pt was discharged on 18th   Pt's daughter, Jeannene Patella got several med changes from the ED MD and would like them to be reviewed Dr. Loletha Grayer as she does not feel comfortable making these changes to her fathers medications after him being in the hospital for less than 24 hrs.   ED MD advised pt: -Stop taking Eliquis  -Stop Lisinopril -Hold Lasix and Potassium until 4/21 -New RX of Hydralazine   Pt was given Heparin in the ED. Pts BUN and Cr are elevated.   Pts daughter states she is holding all meds until advisement from cardiology. Pt did take a 81mg  Asprin this morning.    Lab results are in chart from 4/18:  APTT : 84 Heparin: >2.20 Potassium: 4.6 BUN: 56 Cr: 2.56  Will route to Dr. Loletha Grayer for advisement.

## 2018-06-27 ENCOUNTER — Other Ambulatory Visit: Payer: Self-pay | Admitting: Internal Medicine

## 2018-06-27 LAB — GLUCOSE, CAPILLARY: Glucose-Capillary: 97 mg/dL (ref 70–99)

## 2018-06-27 NOTE — Telephone Encounter (Signed)
Thanks.  Sounds good. 

## 2018-06-27 NOTE — Telephone Encounter (Signed)
85yo, 97kg, Scr 2.56 on 06/23/18 - pt admitted for acute kidney injury - will follow Scr  Last OV 05/10/18 Indication aflutter

## 2018-06-28 ENCOUNTER — Other Ambulatory Visit: Payer: Self-pay

## 2018-06-28 DIAGNOSIS — I255 Ischemic cardiomyopathy: Secondary | ICD-10-CM

## 2018-06-28 LAB — CBC
Hematocrit: 25.1 % — ABNORMAL LOW (ref 37.5–51.0)
Hemoglobin: 8.3 g/dL — ABNORMAL LOW (ref 13.0–17.7)
MCH: 31.9 pg (ref 26.6–33.0)
MCHC: 33.1 g/dL (ref 31.5–35.7)
MCV: 97 fL (ref 79–97)
Platelets: 147 10*3/uL — ABNORMAL LOW (ref 150–450)
RBC: 2.6 x10E6/uL — CL (ref 4.14–5.80)
RDW: 12.3 % (ref 11.6–15.4)
WBC: 4.8 10*3/uL (ref 3.4–10.8)

## 2018-06-29 ENCOUNTER — Other Ambulatory Visit: Payer: Self-pay

## 2018-06-29 ENCOUNTER — Telehealth: Payer: Self-pay | Admitting: Physician Assistant

## 2018-06-29 DIAGNOSIS — Z79899 Other long term (current) drug therapy: Secondary | ICD-10-CM

## 2018-06-29 LAB — BASIC METABOLIC PANEL
BUN/Creatinine Ratio: 17 (ref 10–24)
BUN: 37 mg/dL — ABNORMAL HIGH (ref 8–27)
CO2: 17 mmol/L — ABNORMAL LOW (ref 20–29)
Calcium: 9.5 mg/dL (ref 8.6–10.2)
Chloride: 105 mmol/L (ref 96–106)
Creatinine, Ser: 2.22 mg/dL — ABNORMAL HIGH (ref 0.76–1.27)
GFR calc Af Amer: 31 mL/min/{1.73_m2} — ABNORMAL LOW (ref >59)
GFR calc non Af Amer: 26 mL/min/{1.73_m2} — ABNORMAL LOW (ref >59)
Glucose: 96 mg/dL (ref 65–99)
Potassium: 5.1 mmol/L (ref 3.5–5.2)
Sodium: 138 mmol/L (ref 134–144)

## 2018-06-29 NOTE — Telephone Encounter (Signed)
   Spoke w/ pt to make sure he has appt w/ PCP>>sees him on Monday.  Reviewed labs w/ him and passed along Kdur med change to 1/2 tab 3 x week.   Pt indicated understanding.   Feels a little better today.  Rosaria Ferries, PA-C 06/29/2018 3:57 PM Beeper [phone]

## 2018-07-03 ENCOUNTER — Telehealth: Payer: Self-pay | Admitting: Physician Assistant

## 2018-07-03 NOTE — Telephone Encounter (Signed)
Thanks

## 2018-07-03 NOTE — Telephone Encounter (Signed)
I reached out to pt's daughter Jeannene Patella to make sure he was taking 2.5 mg eliquis BID. She confirmed that he was. She also confirmed 10 mEq potassium three times per week.   He saw his PCP yesterday and it sounds like he was referred to GI and to nephrology. PCP advised to take lasix daily.    Tami Lin Duke, PA-C 07/03/2018, 1:38 PM

## 2018-07-04 NOTE — Telephone Encounter (Signed)
Follow Up:      Returning your call from today. 

## 2018-07-04 NOTE — Telephone Encounter (Signed)
See lab results ./cy 

## 2018-07-10 ENCOUNTER — Other Ambulatory Visit: Payer: Self-pay | Admitting: Gastroenterology

## 2018-07-12 ENCOUNTER — Other Ambulatory Visit: Payer: Self-pay | Admitting: Gastroenterology

## 2018-07-17 ENCOUNTER — Telehealth: Payer: Self-pay

## 2018-07-17 NOTE — Telephone Encounter (Signed)
Please comment on holding Eliquis for colonoscopy. 

## 2018-07-17 NOTE — Telephone Encounter (Signed)
   [facility] Pre-operative Risk Assessment    Request for surgical clearance:  1. What type of surgery is being performed? Colonoscopy/Endoscopy   2. When is this surgery scheduled? 07/24/18   3. What type of clearance is required (medical clearance vs. Pharmacy clearance to hold med vs. Both)? Both  4. Are there any medications that need to be held prior to surgery and how long?Eliquis    5. Practice name and name of physician performing surgery? [facility]   6. What is your office phone number [phone]   7.   What is your office fax number [phone]  8.   Anesthesia type (None, local, MAC, general) ? Unknown   Meryl Crutch 07/17/2018, 2:28 PM  _________________________________________________________________   (provider comments below)

## 2018-07-17 NOTE — Telephone Encounter (Signed)
Pt takes Eliquis for afib with CHADS2VASc score of (age x2, CHF, HTN, DM, CAD). SCr 2.22, CrCl 3mL/min. Phone note from 4/20 discusses reducing Eliquis dose to 2.5mg  BID since renal function had worsened and age > 80 and SCr > 1.5. Note mentions discussion with PharmD about dosing, however refill was sent in on 4/22 for full 5mg  BID dosing - will clarify first before providing clearance recommendation.

## 2018-07-18 NOTE — Telephone Encounter (Signed)
I tried to call patient's daughter, Jeannene Patella.  Mailbox full.  We will try to call back later this afternoon to confirm dosing of Eliquis.

## 2018-07-18 NOTE — Telephone Encounter (Signed)
Attempted to reach out to patient. Pt unavailable and unable to leave message for DPR.  I actually am not sure what happened here. I remember that the 2.5mg  dose was correct and thought that was what was refilled.

## 2018-07-20 ENCOUNTER — Other Ambulatory Visit (HOSPITAL_COMMUNITY): Admission: RE | Admit: 2018-07-20 | Payer: Medicare Other | Source: Ambulatory Visit

## 2018-07-23 ENCOUNTER — Other Ambulatory Visit: Payer: Self-pay

## 2018-07-23 ENCOUNTER — Ambulatory Visit (INDEPENDENT_AMBULATORY_CARE_PROVIDER_SITE_OTHER): Payer: Medicare Other | Admitting: *Deleted

## 2018-07-23 DIAGNOSIS — I441 Atrioventricular block, second degree: Secondary | ICD-10-CM

## 2018-07-23 NOTE — Telephone Encounter (Signed)
Called DPR again [facility].   Spoke with patient who states that he does have 5mg  tablets, but has been cutting them in 1/2 and taking 1/2 tablet twice daily (2.5mg  BID).   He also reports that he will not have colonoscopy due to coronavirus (cancelled in chart as well). He states he cancelled the procedure and will not reschedule at this time until safe from [place]. Advised that he call the office if rescheduled as will need to give instructions for anticoagulation. He states understanding and will call with any additional questions or concerns.   Will route to GI via Epic to make aware to contact for anticoag clearance once reschedule procedure. This has been sent to number provided.

## 2018-07-23 NOTE — Telephone Encounter (Signed)
Pharm - just following up on guidance for [facility].   Thanks Angie

## 2018-07-24 ENCOUNTER — Encounter (HOSPITAL_COMMUNITY): Admission: RE | Payer: Self-pay | Source: Home / Self Care

## 2018-07-24 ENCOUNTER — Ambulatory Visit (HOSPITAL_COMMUNITY): Admission: RE | Admit: 2018-07-24 | Payer: Medicare Other | Source: Home / Self Care | Admitting: Gastroenterology

## 2018-07-24 LAB — CUP PACEART REMOTE DEVICE CHECK
Battery Impedance: 624 Ohm
Battery Remaining Longevity: 73 mo
Battery Voltage: 2.78 V
Brady Statistic AP VP Percent: 97 %
Brady Statistic AP VS Percent: 1 %
Brady Statistic AS VP Percent: 3 %
Brady Statistic AS VS Percent: 0 %
Date Time Interrogation Session: 20200518135555
Implantable Lead Implant Date: 20140318
Implantable Lead Implant Date: 20140318
Implantable Lead Location: 753859
Implantable Lead Location: 753860
Implantable Lead Model: 5076
Implantable Lead Model: 5076
Implantable Pulse Generator Implant Date: 20140318
Lead Channel Impedance Value: 457 Ohm
Lead Channel Impedance Value: 963 Ohm
Lead Channel Pacing Threshold Amplitude: 0.625 V
Lead Channel Pacing Threshold Amplitude: 0.75 V
Lead Channel Pacing Threshold Pulse Width: 0.4 ms
Lead Channel Pacing Threshold Pulse Width: 0.4 ms
Lead Channel Setting Pacing Amplitude: 2 V
Lead Channel Setting Pacing Amplitude: 2.5 V
Lead Channel Setting Pacing Pulse Width: 0.4 ms
Lead Channel Setting Sensing Sensitivity: 2.8 mV

## 2018-07-24 SURGERY — COLONOSCOPY WITH PROPOFOL
Anesthesia: Monitor Anesthesia Care

## 2018-08-01 ENCOUNTER — Encounter: Payer: Self-pay | Admitting: Cardiology

## 2018-08-01 NOTE — Progress Notes (Signed)
Remote pacemaker transmission.   

## 2018-08-04 ENCOUNTER — Other Ambulatory Visit: Payer: Self-pay | Admitting: Internal Medicine

## 2018-08-06 ENCOUNTER — Other Ambulatory Visit: Payer: Self-pay | Admitting: Pharmacist Clinician (PhC)/ Clinical Pharmacy Specialist

## 2018-08-06 MED ORDER — APIXABAN 2.5 MG PO TABS: 2.5000 mg | ORAL_TABLET | Freq: Two times a day (BID) | ORAL | 1 refills | Status: DC

## 2018-08-29 ENCOUNTER — Other Ambulatory Visit: Payer: Self-pay

## 2018-08-29 ENCOUNTER — Encounter: Payer: Self-pay | Admitting: Internal Medicine

## 2018-08-29 ENCOUNTER — Ambulatory Visit: Payer: Medicare Other | Admitting: Internal Medicine

## 2018-08-29 VITALS — BP 132/64 | HR 87 | Temp 96.8°F | Resp 12 | Ht 71.0 in | Wt 216.2 lb

## 2018-08-29 DIAGNOSIS — E785 Hyperlipidemia, unspecified: Secondary | ICD-10-CM | POA: Diagnosis not present

## 2018-08-29 DIAGNOSIS — J449 Chronic obstructive pulmonary disease, unspecified: Secondary | ICD-10-CM | POA: Diagnosis not present

## 2018-08-29 DIAGNOSIS — I5022 Chronic systolic (congestive) heart failure: Secondary | ICD-10-CM

## 2018-08-29 DIAGNOSIS — Z950 Presence of cardiac pacemaker: Secondary | ICD-10-CM | POA: Diagnosis not present

## 2018-08-29 NOTE — Progress Notes (Signed)
   OFFICE NOTE  Chief Complaint:  Atrial fibrillation  Primary Care Physician: Wong, Francis P, MD  HPI:  Jesus Oconnor is a 85-year-old gentleman who we have been following closely with a history of 5-vessel CABG and atrial flutter in 2012. He has done well with lack of chest pain; however, he says he does have low energy which has been pretty much stable, not necessarily improved after surgery. He also has significant abnormalities in his EKG as well indicating a conduction delay. When his episode of atrial flutter was occurring his ventricular response was only in the 40s. He was also noted on EKG to have trifascicular block and periods of Wenckebach. Today, he actually had some more Wenckebach and couplets as well as continued trifascicular block. He has had paroxysmal A-flutter and we have kept him off a beta blocker for those reasons. I placed him on a monitor which demonstrated significant bradycardia for which she was symptomatic and ultimately underwent a permanent pacemaker. He reports that this has helped him significantly and recently followed up with Dr. C in the office. The pacemaker was interrogated and apparently is working properly. Jesus Oconnor has no significant complaints other than some right knee pain and itchiness and tenderness along the lower aspect of his midline sternal incision, where he has a keloid scar.  Of note he recently had acute cholecystitis and is status post cholecystectomy.  Jesus Oconnor returns for followup today. He saw Dr. C last week for pacemaker check and it seems to be working appropriately. He has had some problems with heart failure. He had fluid gain and it turns out that he was not taking his Lasix on a scheduled basis. He since restarted taking that every day and is feeling much better. He has no complaints of shortness of breath or other symptoms.  08/19/2015  Jesus Oconnor was seen today back in the office for follow-up. I had last seen him  in 2015 and he was recently seen by Dr. C in the office. This was for device check at which time he was noted to have some worsening fatigue. He is also noted to have some leg swelling. An echocardiogram was performed and unfortunately shows a marked reduction in LVEF to 30-35%. The ventricle was noted to be mildly dilated with diffuse hypokinesis and grade 2 diastolic dysfunction. He is currently on Lasix 40 mg daily and reports his weight is been fairly stable. He denies any orthopnea, PND or significant worsening of dyspnea on exertion. He has reported however fatigue and also denies any chest pain. Blood pressure is elevated today 170/80. When discussing about possible causes of his cardiomyopathy, he does report that he is using at least a moderate amount of alcohol and drinks 3-4 beers at least 3 times a week.  09/09/2015  Jesus Oconnor returns to follow-up his recent heart catheterization. This demonstrated the following:   Mild global LV dysfunction with an ejection fraction of 45-50%. There is significant mitral annular calcification and trace mitral regurgitation.  Mild pulmonary hypertension.  Significant native CAD with 20% mid left main narrowing; diffuse 60-70% proximal and 70% mid LAD stenoses; total occlusion of the proximal circumflex coronary artery with faint antegrade collaterals; and diffusely irregular RCA with mild calcification without apparent high-grade stenosis.  Patent LIMA graft supplying the mid LAD.  Patent sequential vein graft supplying the diagonal 1 and diagonal 2 vessel with mild 30% narrowing in the region of a valve in the proximal portion of the   graft.  Patent vein graft supplying the distal circumflex marginal vessel with smooth 40% narrowing proximally in the region of a valve.  Patent vein graft supplying the distal posterolateral branch of the RCA.  Based on these findings medical therapy was recommended. His filling pressures seem to be well-controlled.  EF was interestingly reported much higher on this study than it was on his echocardiogram. I had recently increased his lisinopril to 20 mg twice a day. We discussed management options today and I feel that he could use better blood pressure control. In addition he recently stopped alcohol completely check commended him on.  11/27/2015  Jesus Oconnor returns today for follow-up. After some medication adjustments his blood pressure is now much better controlled 120/70. He denies any chest pain or worsening shortness of breath. We've been continuing to up titrate his medications for heart failure. He's also completely stopped alcohol use. He is due for a remote pacemaker check which is coming up fairly soon. His pacemaker is now been in place for about 4 years. It seems to be AV pacing today.  06/03/2016  Jesus Oconnor is seen today in follow-up. Overall he seems to be doing well. Weight is down about 5 pounds since I last saw him. Is no evidence for heart failure. His weight loss may be attributable to decreased appetite. Blood pressure is good at 124/70. EKG shows a paced rhythm at 75. He says he's had some episodes of palpitations mostly associated with alcohol use which he has significantly reduced if not stopped. He is reporting some bilateral lower extremity swelling. This seems to be worse when sitting for long periods of time and tends to go away almost completely when elevating his feet at night.  12/02/2016  Jesus Oconnor returns today for follow-up. In July he underwent right total knee replacement. He had 3 weeks of rehabilitation which she said went well for the right knee however they work the left knee a lot and he had developed some inflammatory arthritis requiring drainage. He says that he's overall walking better. He denies any worsening shortness of breath or heart failure symptoms. Blood pressure is well-controlled today. His pacemaker was assessed this past spring by Dr. C and is working  appropriately with adequate battery life. He is reporting some right lower extremity swelling in the area where his right knee replacement was. There is some evidence of venous insufficiency.  03/22/2018  Jesus Oconnor is seen today in follow-up.  He was scheduled for routine follow-up but also recently had pacemaker interrogation.  This showed a persistent episode of atrial fibrillation that lasted approximately 49 minutes.  He has not had any further episodes.  He has had episodes of PAF and flutter in the past.  He is increasingly being paced with features of heart block.  He does have a history of cardiomyopathy with EF as low as 35 to 40% however was last at about 45% by cath in 2017.  He denies any worsening shortness of breath.  Unfortunately had a fall a few days ago.  He does have some ecchymosis over the right cheek as well as the right anterior chest.  He has soreness in his chest which hurts worse when he coughs or takes a deep breath and I suspect may represent a rib fracture.  That is the only fall that he has had and it was not related to syncope rather he tripped on an uneven sidewalk.  Full dose aspirin.  Recent labs from November 2019 showed   total cholesterol 119, HDL 47, LDL 47 and triglycerides 126.  Hemoglobin A1c is 6.7.  Creatinine of 1.2.  08/29/2018  Jesus Oconnor has no complaints today. He denies chest pain, shortness of breath or recurrent afib. He recently had remote interrogation of his pacemaker in May which showed normal function and underlying complete heart block, he is pacemaker dependent.  There was a low burden of A. fib less than 1% but with occasional lengthy episodes of A. fib.  We had switched him to Eliquis however due to renal function he is now on the 2.5 mg twice daily dosing.  He does report less bruising on this.  Denies any bleeding.  He does get some lower extremity edema which may be due to a combination of heart failure or his amlodipine.  Blood pressures well  controlled today.  Generally his edema improves with Lasix or elevation.  PMHx:  Past Medical History:  Diagnosis Date  . Anemia    history of  . Arthritis   . Atrial flutter (HCC)    severe bradycardia  . CHF (congestive heart failure) ([place])   . Constipation   . COPD (chronic obstructive pulmonary disease) ([place])   . Coronary artery disease   . Cough   . Diarrhea   . Diastolic dysfunction    Echo 07/10/9739-ULAGT I diastolic dysfunction,EF 36-46%  . DM2 (diabetes mellitus, type 2) ([place])   . Dyslipidemia   . History of kidney stones   . HTN (hypertension)   . Hyperlipidemia   . Leg swelling   . Mobitz type 1 second degree atrioventricular block   . Pacemaker 05/22/2012   Dr  Croitoru    Medtronic Adapta  . 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 SVG to firts & second diagonals,SVG to forth posterolateral branch to RCA  . Sore throat   . Wheezing     Past Surgical History:  Procedure Laterality Date  . CABG x5  09/13/2010   Dr Farrel Conners to LAD,SVG to obtuse marginal,seq. SVG to first & second diagonals,SVG to fourth posterolateral branch RCA  . CARDIAC CATHETERIZATION  09/10/2010   severe LAD,D1,D2 & ostial D2  . CARDIAC CATHETERIZATION N/A 08/28/2015   Procedure: Right/Left Heart Cath and Coronary/Graft Angiography;  Surgeon: Troy Sine, MD;  Location: [place] [facility];  Service: Cardiovascular;  Laterality: N/A;  . CHOLECYSTECTOMY  october 2012  . CORONARY ARTERY BYPASS GRAFT    . INSERT / REPLACE / REMOVE PACEMAKER  05/22/2012    dual chamber  . PERMANENT PACEMAKER INSERTION N/A 05/22/2012   Procedure: PERMANENT PACEMAKER INSERTION;  Surgeon: Sanda Klein, MD;  Location: [place] [facility];  Service: Cardiovascular;  Laterality: N/A;  . repair of left indirect inguinal hernia with mesh.    . repair of umbilical hernia.    Marland Kitchen TOTAL KNEE ARTHROPLASTY Right 09/26/2016   Procedure: TOTAL KNEE ARTHROPLASTY;  Surgeon: Frederik Pear, MD;  Location: [place];   Service: Orthopedics;  Laterality: Right;    FAMHx:  Family History  Problem Relation Oconnor of Onset  . Heart disease Father   . Heart disease Brother        s/p cabg  . Heart disease Maternal Uncle     SOCHx:   reports that he quit smoking about 8 years ago. His smoking use included cigarettes. He quit after 40.00 years of use. He has never used smokeless tobacco. He reports current alcohol use. He reports current drug use. Drug: Marijuana.  ALLERGIES:  Allergies  Allergen Reactions  . No Known Allergies     ROS: Pertinent items noted in HPI and remainder of comprehensive ROS otherwise negative.  HOME MEDS: Current Outpatient Medications  Medication Sig Dispense Refill  . amLODipine (NORVASC) 10 MG tablet Take 1 tablet (10 mg total) by mouth daily. (Patient taking differently: Take 5 mg by mouth daily. ) 90 tablet 3  . apixaban (ELIQUIS) 2.5 MG TABS tablet Take 1 tablet (2.5 mg total) by mouth 2 (two) times daily. 180 tablet 1  . Cholecalciferol (VITAMIN D3) 125 MCG (5000 UT) TABS Take 5,000 Units by mouth daily.    . Cyanocobalamin (B-12) 1000 MCG/ML KIT Inject 1 Dose as directed every 30 (thirty) days.     . ferrous sulfate 325 (65 FE) MG tablet Take 325 mg by mouth daily with breakfast.    . fluticasone furoate-vilanterol (BREO ELLIPTA) 200-25 MCG/INH AEPB Inhale 1 puff into the lungs 2 (two) times daily.    . furosemide (LASIX) 40 MG tablet Take 1 tablet (40 mg total) by mouth daily. 90 tablet 2  . glimepiride (AMARYL) 1 MG tablet Take 1 mg by mouth daily with breakfast.    . metoprolol tartrate (LOPRESSOR) 25 MG tablet TAKE 1 TABLET BY MOUTH TWO  TIMES DAILY 180 tablet 1  . mupirocin ointment (BACTROBAN) 2 % Place 1 application into the nose 2 (two) times daily as needed (applies to foot as needed).    . ONETOUCH VERIO test strip daily. As directed  4  . potassium chloride SA (K-DUR) 20 MEQ tablet Take 1 tablet (20 mEq total) by mouth daily. (Patient taking differently: Take  10 mEq by mouth every Monday, Wednesday, and Friday. ) 30 tablet 10  . rosuvastatin (CRESTOR) 40 MG tablet Take 40 mg by mouth daily.    . traZODone (DESYREL) 50 MG tablet Take 50 mg by mouth at bedtime as needed for sleep.     . WIXELA INHUB 250-50 MCG/DOSE AEPB Inhale 1 puff into the lungs 2 (two) times a day.    . hydrALAZINE (APRESOLINE) 25 MG tablet Take 1 tablet (25 mg total) by mouth 3 (three) times daily for 30 days. 90 tablet 0   No current facility-administered medications for this visit.     LABS/IMAGING: No results found for this or any previous visit (from the past 48 hour(s)). No results found.  VITALS: BP 132/64   Pulse 87   Temp (!) 96.8 F (36 C)   Resp 12   Ht 5' 11" (1.803 m)   Wt 216 lb 3.2 oz (98.1 kg)   SpO2 95%   BMI 30.15 kg/m   EXAM: General appearance: alert and no distress Neck: no carotid bruit and no JVD Lungs: clear to auscultation bilaterally Heart: regular rate and rhythm, S1, S2 normal, no murmur, click, rub or gallop Abdomen: soft, non-tender; bowel sounds normal; no masses,  no organomegaly Extremities: Status post right TKR, 2+ right lower extremity stasis edema Pulses: 2+ and symmetric Skin: Skin color, texture, turgor normal. No rashes or lesions Neurologic: Grossly normal Psych: Pleasant  EKG: AV dual paced rhythm at 62-personally reviewed  ASSESSMENT: 1. Paroxysmal atrial fibrillation-CHADSVASC score of 4 2. Status post right TKR with venous stasis edema 3. Cardiomyopathy with newly reduced EF to 30-35% - no new obstructive disease by cath, EF 45-50% 4. 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 Hypertension-controlled 6. Dyslipidemia-at goal 7. CABG x 5 vessels (  2012) 8. Recent moderate alcohol abuse-now abstinent 9. Dependent edema  PLAN:  1.   Jesus Oconnor has done well after pacemaker placement.  He denies any worsening shortness of breath or  chest pain.  His A. fib burden is low about 0.2%.  He is anticoagulated on Eliquis 2.5 mg twice daily due to decreased renal function.  Blood pressure is now controlled.  His cholesterols been at goal.  Overall he seems to be stable.  No medicine changes were made today.  Plan follow-up with me in 6 months or sooner as necessary.  Pixie Casino, MD, [facility], [place] Director of the Advanced Lipid Disorders &  Cardiovascular Risk Reduction Clinic Diplomate of the American Board of Clinical Lipidology Attending Cardiologist  Direct Dial: [phone]  Fax: [phone]  Website:  [email or web address] 08/29/2018, 10:46 AM

## 2018-08-29 NOTE — Patient Instructions (Signed)
Medication Instructions:  Continue current medications If you need a refill on your cardiac medications before your next appointment, please call your pharmacy.   Follow-Up: At [facility], you and your health needs are our priority.  As part of our continuing mission to provide you with exceptional heart care, we have created designated Provider Care Teams.  These Care Teams include your primary Cardiologist (physician) and Advanced Practice Providers (APPs -  Physician Assistants and Nurse Practitioners) who all work together to provide you with the care you need, when you need it. You will need a follow up appointment in 6 months.  Please call our office 2 months in advance to schedule this appointment.  You may see Kenneth C Hilty, MD or one of the following Advanced Practice Providers on your designated Care Team: Hao Meng, PA-C . Angela Duke, PA-C  Any Other Special Instructions Will Be Listed Below (If Applicable).    

## 2018-09-03 ENCOUNTER — Other Ambulatory Visit: Payer: Self-pay | Admitting: Internal Medicine

## 2018-09-03 ENCOUNTER — Telehealth: Payer: Self-pay | Admitting: Internal Medicine

## 2018-09-03 NOTE — Telephone Encounter (Signed)
 *  STAT* If patient is at the pharmacy, call can be transferred to refill team.   1. Which medications need to be refilled? (please list name of each medication and dose if known) apixaban (ELIQUIS) 2.5 MG TABS tablet   Patient is asking for 5 mg so they can cut them in half. It is cheaper for them to do it that way.   2. Which pharmacy/location (including street and city if local pharmacy) is medication to be sent to? [facility]  3. Do they need a 30 day or 90 day supply? [place]

## 2018-09-04 ENCOUNTER — Telehealth: Payer: Self-pay | Admitting: Internal Medicine

## 2018-09-04 MED ORDER — APIXABAN 2.5 MG PO TABS: 2.5000 mg | ORAL_TABLET | Freq: Two times a day (BID) | ORAL | 1 refills | Status: DC

## 2018-09-04 NOTE — Telephone Encounter (Signed)
  Granddaughter called yesterday 09/02/28 and requested a refill of his Eliquis but they wanted 5mg  so they could cut it in half. It was noted on refill request. They can get the 5 mg for same price as other and cut it in half and the prescription will last longer. She is calling today to say that we called in the 2.5 mg and would like to know if we can do the 5mg  instead. Please let them know.

## 2018-09-05 MED ORDER — APIXABAN 5 MG PO TABS: ORAL_TABLET | ORAL | 1 refills | Status: DC

## 2018-09-05 NOTE — Telephone Encounter (Signed)
Sent to [facility] 

## 2018-09-05 NOTE — Telephone Encounter (Signed)
Sent to MD to [facility] eliquis prescription of 5mg  - take one-half tablet (2.5mg ) by mouth twice daily, as requested by patient

## 2018-09-05 NOTE — Telephone Encounter (Signed)
Rx sent to pharmacy as OK'ed by MD

## 2018-09-05 NOTE — Telephone Encounter (Signed)
We recommneda against use of 5mg  tablet to cut in 1/2. Table is not scored and can't guarantee patient will take 2.5mg  every 12 hours.  This will need cardiologist approval.

## 2018-09-05 NOTE — Telephone Encounter (Signed)
Ok to prescribe that way.  Dr. Lemmie Evens

## 2018-10-22 ENCOUNTER — Ambulatory Visit (INDEPENDENT_AMBULATORY_CARE_PROVIDER_SITE_OTHER): Payer: Medicare Other | Admitting: *Deleted

## 2018-10-22 DIAGNOSIS — I442 Atrioventricular block, complete: Secondary | ICD-10-CM

## 2018-10-23 ENCOUNTER — Telehealth: Payer: Self-pay

## 2018-10-23 LAB — CUP PACEART REMOTE DEVICE CHECK
Battery Impedance: 699 Ohm
Battery Remaining Longevity: 66 mo
Battery Voltage: 2.77 V
Brady Statistic AP VP Percent: 97 %
Brady Statistic AP VS Percent: 1 %
Brady Statistic AS VP Percent: 2 %
Brady Statistic AS VS Percent: 0 %
Date Time Interrogation Session: 20200818160933
Implantable Lead Implant Date: 20140318
Implantable Lead Implant Date: 20140318
Implantable Lead Location: 753859
Implantable Lead Location: 753860
Implantable Lead Model: 5076
Implantable Lead Model: 5076
Implantable Pulse Generator Implant Date: 20140318
Lead Channel Impedance Value: 443 Ohm
Lead Channel Impedance Value: 695 Ohm
Lead Channel Pacing Threshold Amplitude: 0.75 V
Lead Channel Pacing Threshold Amplitude: 0.875 V
Lead Channel Pacing Threshold Pulse Width: 0.4 ms
Lead Channel Pacing Threshold Pulse Width: 0.4 ms
Lead Channel Setting Pacing Amplitude: 2 V
Lead Channel Setting Pacing Amplitude: 2.5 V
Lead Channel Setting Pacing Pulse Width: 0.4 ms
Lead Channel Setting Sensing Sensitivity: 2.8 mV

## 2018-10-23 NOTE — Telephone Encounter (Signed)
Spoke with patient to remind of missed remote transmission 

## 2018-10-30 ENCOUNTER — Encounter: Payer: Self-pay | Admitting: Cardiology

## 2018-10-30 NOTE — Progress Notes (Signed)
Remote pacemaker transmission.   

## 2018-12-02 ENCOUNTER — Other Ambulatory Visit: Payer: Self-pay | Admitting: Cardiovascular Disease

## 2019-01-14 ENCOUNTER — Encounter: Payer: Self-pay | Admitting: Cardiovascular Disease

## 2019-01-14 ENCOUNTER — Other Ambulatory Visit: Payer: Self-pay

## 2019-01-14 ENCOUNTER — Ambulatory Visit (INDEPENDENT_AMBULATORY_CARE_PROVIDER_SITE_OTHER): Payer: Medicare Other | Admitting: Cardiovascular Disease

## 2019-01-14 VITALS — BP 144/79 | HR 78 | Ht 71.0 in | Wt 228.0 lb

## 2019-01-14 DIAGNOSIS — I480 Paroxysmal atrial fibrillation: Secondary | ICD-10-CM

## 2019-01-14 DIAGNOSIS — I442 Atrioventricular block, complete: Secondary | ICD-10-CM

## 2019-01-14 DIAGNOSIS — I10 Essential (primary) hypertension: Secondary | ICD-10-CM

## 2019-01-14 DIAGNOSIS — J449 Chronic obstructive pulmonary disease, unspecified: Secondary | ICD-10-CM

## 2019-01-14 DIAGNOSIS — E1159 Type 2 diabetes mellitus with other circulatory complications: Secondary | ICD-10-CM

## 2019-01-14 DIAGNOSIS — I5043 Acute on chronic combined systolic (congestive) and diastolic (congestive) heart failure: Secondary | ICD-10-CM | POA: Diagnosis not present

## 2019-01-14 DIAGNOSIS — I25810 Atherosclerosis of coronary artery bypass graft(s) without angina pectoris: Secondary | ICD-10-CM

## 2019-01-14 DIAGNOSIS — E785 Hyperlipidemia, unspecified: Secondary | ICD-10-CM

## 2019-01-14 DIAGNOSIS — Z950 Presence of cardiac pacemaker: Secondary | ICD-10-CM

## 2019-01-14 NOTE — Patient Instructions (Signed)

## 2019-01-14 NOTE — Progress Notes (Signed)
Patient ID: Jesus Oconnor, male   DOB: [birth year], 85 y.o.   MRN: [id number]     Cardiology Office Note    Date:  01/14/2019   ID:  Jesus Oconnor, DOB 1933-03-09, MRN [id number]  PCP:  Vernie Shanks, MD  Cardiologist:   Sanda Klein, MD   Chief Complaint  Patient presents with  . Follow-up    CHF, CAD, AV block, pacemaker     History of Present Illness:  Jesus Oconnor is a 85 y.o. 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  Over the years, he has had repeated problems with paroxysmal atrial tachycardia, brief atrial flutter and his pacemaker has recorded occasional nonsustained ventricular tachycardia.    Last month, for the first time he had sustained atrial fibrillation, lasting for about 12 days.  This was associated with a sensation of pounding in his ears, mildly reduced exercise tolerance, but most significantly with substantial leg edema.  He has been started on Eliquis, which is well-tolerated to date, without bleeding complications.  His edema is improving on a higher dose of diuretics, although he complains about frequent urination.  He reports that now when he wakes up in the morning the swelling is gone, but it increases towards the end of the day.  He has a follow-up visit with Dr. Debara Pickett on December 4.  He has gained weight, but it is unclear how much of this is fluid and how much is true weight gain since he has been less active during the coronavirus pandemic.  He had right total knee replacement for severe arthritis.  This is also the site of his saphenous vein harvest for his previous bypass. The right calf swells a little more than the left.    He denies exertional angina, orthopnea, PND,  syncope, intermittent claudication, focal neurological complaints or bleeding problems.  In 2017, he developed edema and shortness of breath and echocardiography showed an EF had dropped to 30-35 %. On 08/28/2015 he underwent right and left heart catheterization. Estimated LVEF was better at 45-50 %. He had severe native coronary disease, but all his bypasses (LIMA to LAD, SVG sequential diagonal 1-diagonal 2, SVG to distal circumflex, SVG to PLA branch of RCA) were widely patent. Medical therapy was recommended.  He saw Dr. Debara Pickett in June 2020.  They cannot do it through vestibular Notes chronic intermittent  Normal pacemaker check performed today. This showed battery longevity is estimated at 5 years, 97.6% atrial pacing, 99.2% ventricular pacing.  He was in persistent atrial fibrillation with ventricular pacing from October 4 through October 16.  The ventricular heart rate histogram is normal.  As before, he does not have any detectable atrial activity when pacing rate is taken down.  There was no idioventricular escape rhythm today either.  He is pacemaker dependent.   Past Medical History:  Diagnosis Date  . Anemia    history of  . Arthritis   . Atrial flutter (HCC)    severe bradycardia  . CHF (congestive heart failure) ([place])   . Constipation   . COPD (chronic obstructive pulmonary disease) ([place])   . Coronary artery disease   . Cough   . Diarrhea   . Diastolic dysfunction    Echo 10/08/6627-UTMLY I diastolic dysfunction,EF 65-03%  . DM2 (diabetes mellitus, type  2) ([place])   . Dyslipidemia   . History of kidney stones   . HTN (hypertension)   . Hyperlipidemia   . Leg swelling   . Mobitz type 1 second degree atrioventricular block   . Pacemaker 05/22/2012   Dr  Amia Rynders    Medtronic Adapta  . 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 SVG to firts & second diagonals,SVG to forth posterolateral branch to RCA  . Sore throat   . Wheezing     Past Surgical  History:  Procedure Laterality Date  . CABG x5  09/13/2010   Dr Farrel Conners to LAD,SVG to obtuse marginal,seq. SVG to first & second diagonals,SVG to fourth posterolateral branch RCA  . CARDIAC CATHETERIZATION  09/10/2010   severe LAD,D1,D2 & ostial D2  . CARDIAC CATHETERIZATION N/A 08/28/2015   Procedure: Right/Left Heart Cath and Coronary/Graft Angiography;  Surgeon: Troy Sine, MD;  Location: [facility];  Service: Cardiovascular;  Laterality: N/A;  . CHOLECYSTECTOMY  october 2012  . CORONARY ARTERY BYPASS GRAFT    . INSERT / REPLACE / REMOVE PACEMAKER  05/22/2012    dual chamber  . PERMANENT PACEMAKER INSERTION N/A 05/22/2012   Procedure: PERMANENT PACEMAKER INSERTION;  Surgeon: Sanda Klein, MD;  Location: [facility];  Service: Cardiovascular;  Laterality: N/A;  . repair of left indirect inguinal hernia with mesh.    . repair of umbilical hernia.    Marland Kitchen TOTAL KNEE ARTHROPLASTY Right 09/26/2016   Procedure: TOTAL KNEE ARTHROPLASTY;  Surgeon: Frederik Pear, MD;  Location: [facility];  Service: Orthopedics;  Laterality: Right;    Current Medications: Outpatient Medications Prior to Visit  Medication Sig Dispense Refill  . amLODipine (NORVASC) 10 MG tablet Take 1 tablet (10 mg total) by mouth daily. (Patient taking differently: Take 5 mg by mouth daily. ) 90 tablet 3  . apixaban (ELIQUIS) 5 MG TABS tablet Take 2.41m (one-half tablet) by mouth twice daily. 180 tablet 1  . Cholecalciferol (VITAMIN D3) 125 MCG (5000 UT) TABS Take 5,000 Units by mouth daily.    . Cyanocobalamin (B-12) 1000 MCG/ML KIT Inject 1 Dose as directed every 30 (thirty) days.     . ferrous sulfate 325 (65 FE) MG tablet Take 325 mg by mouth daily with breakfast.    . fluticasone furoate-vilanterol (BREO ELLIPTA) 200-25 MCG/INH AEPB Inhale 1 puff into the lungs 2 (two) times daily.    . furosemide (LASIX) 40 MG tablet Take 1 tablet (40 mg total) by mouth daily. 90 tablet 2  . glimepiride (AMARYL) 1 MG tablet Take 1 mg  by mouth daily with breakfast.    . metFORMIN (GLUCOPHAGE) 500 MG tablet Take by mouth 2 (two) times daily with a meal.    . metoprolol tartrate (LOPRESSOR) 25 MG tablet TAKE 1 TABLET BY MOUTH  TWICE DAILY 180 tablet 3  . ONETOUCH VERIO test strip daily. As directed  4  . potassium chloride SA (K-DUR) 20 MEQ tablet Take 1 tablet (20 mEq total) by mouth daily. (Patient taking differently: Take 10 mEq by mouth every Monday, Wednesday, and Friday. ) 30 tablet 10  . rosuvastatin (CRESTOR) 40 MG tablet Take 40 mg by mouth daily.    . traZODone (DESYREL) 50 MG tablet Take 50 mg by mouth at bedtime as needed for sleep.     .Grant RutsINHUB 250-50 MCG/DOSE AEPB Inhale 1 puff into the lungs 2 (two) times a day.    . mupirocin ointment (BACTROBAN) 2 %  Place 1 application into the nose 2 (two) times daily as needed (applies to foot as needed).    . hydrALAZINE (APRESOLINE) 25 MG tablet Take 1 tablet (25 mg total) by mouth 3 (three) times daily for 30 days. 90 tablet 0   No facility-administered medications prior to visit.      Allergies:   No known allergies   Social History   Socioeconomic History  . Marital status: Widowed    Spouse name: Not on file  . Number of children: 2  . Years of education: Not on file  . Highest education level: Not on file  Occupational History  . Not on file  Social Needs  . Financial resource strain: Not on file  . Food insecurity    Worry: Not on file    Inability: Not on file  . Transportation needs    Medical: Not on file    Non-medical: Not on file  Tobacco Use  . Smoking status: Former Smoker    Years: 40.00    Types: Cigarettes    Quit date: 08/06/2010    Years since quitting: 8.4  . Smokeless tobacco: Never Used  Substance and Sexual Activity  . Alcohol use: Yes    Comment: 5-6 beers per week  . Drug use: Yes    Types: Marijuana    Comment: couple times a month  . Sexual activity: Not on file  Lifestyle  . Physical activity    Days per week: Not  on file    Minutes per session: Not on file  . Stress: Not on file  Relationships  . Social Herbalist on phone: Not on file    Gets together: Not on file    Attends religious service: Not on file    Active member of club or organization: Not on file    Attends meetings of clubs or organizations: Not on file    Relationship status: Not on file  Other Topics Concern  . Not on file  Social History Narrative  . Not on file     Family History:  The patient's family history includes Heart disease in his brother, father, and maternal uncle.   ROS:   Please see the history of present illness.    ROS All other systems are reviewed and are negative.   PHYSICAL EXAM:   VS:  BP (!) 144/79   Pulse 78   Ht _0  (1.803 m)   Wt 228 lb (103.4 kg)   SpO2 98%   BMI 31.80 kg/m      General: Alert, oriented x3, no distress, obese. Healthy L subclavian PM site. Head: no evidence of trauma, PERRL, EOMI, no exophtalmos or lid lag, no myxedema, no xanthelasma; normal ears, nose and oropharynx Neck: normal jugular venous pulsations and no hepatojugular reflux; brisk carotid pulses without delay and no carotid bruits Chest: clear to auscultation, no signs of consolidation by percussion or palpation, normal fremitus, symmetrical and full respiratory excursions Cardiovascular: normal position and quality of the apical impulse, regular rhythm, normal first and second heart sounds, no murmurs, rubs or gallops Abdomen: no tenderness or distention, no masses by palpation, no abnormal pulsatility or arterial bruits, normal bowel sounds, no hepatosplenomegaly Extremities: no clubbing, cyanosis; bilateral 2+ calf edema; 2+ radial, ulnar and brachial pulses bilaterally; 2+ right femoral, posterior tibial and dorsalis pedis pulses; 2+ left femoral, posterior tibial and dorsalis pedis pulses; no subclavian or femoral bruits Neurological: grossly nonfocal Psych: Normal mood and  affect   Wt Readings  from Last 3 Encounters:  01/14/19 228 lb (103.4 kg)  08/29/18 216 lb 3.2 oz (98.1 kg)  06/23/18 213 lb 13.5 oz (97 kg)   Studies/Labs Reviewed:   EKG:  EKG is ordered today.  The ekg ordered today demonstrates 100% AV pacing  Recent Labs: 06/23/2018: ALT 11 06/28/2018: BUN 37; Creatinine, Ser 2.22; Hemoglobin 8.3; Platelets 147; Potassium 5.1; Sodium 138   Lipid Panel Jan 2019 LDL 78  ASSESSMENT:    1. Acute on chronic combined systolic (congestive) and diastolic (congestive) heart failure ([place])   2. Coronary artery disease involving coronary bypass graft of native heart without angina pectoris   3. Paroxysmal atrial fibrillation (HCC)   4. Essential hypertension   5. Chronic obstructive pulmonary disease, unspecified COPD type ([place])   6. CHB (complete heart block) (HCC)   7. Pacemaker   8. Hyperlipidemia LDL goal <70   9. Type 2 diabetes mellitus with other circulatory complication, without long-term current use of insulin (HCC)      PLAN:  In order of problems listed above:  1. CHF: NYHA functional class II.  He recently developed edema, coincident with his episode of atrial fibrillation.  Judging by his physical exam, I suspect he still has about 8-10 pounds of excess fluid.  Difficult to say whether the atrial fibrillation triggered heart failure or vice versa.  He occasionally needs to take an extra dose of furosemide, not recently.  Taking beta-blocker and ACE inhibitor. 2. CAD s/p CABG:  patent grafts at cardiac catheterization 2017.  He does not have angina pectoris. 3. PAFib: He has had previous episodes of atrial tachycardia, but he now had a lengthy episode of paroxysmal atrial fibrillation.  Anticoagulation has been started.  He does not require rate control medicines.  He did appear to develop congestive heart failure around the same time that he had atrial arrhythmia, but has spontaneously converted to normal rhythm.  I do not think antiarrhythmics are indicated yet.   He has a follow-up visit with Dr. Debara Pickett in December. 4. HTN: Excellent control 5. COPD: Metoprolol preferred over metoprolol due to previous problems with wheezing. Option to switch to bisoprolol if this is a continued problem. 6. CHB: He has progressed from high-grade second-degree AV block to complete heart block and is pacemaker dependent.  Although he does have an idioventricular escape rhythm at 30 bpm today, this is not reliably present. 7. PPM: Normal device function. May be a candidate for CRT upgrade if LVEF declines further  in the future. Continue remote downloads every 3 months and yearly office visits. 8. HLP: He should continue taking ezetimibe and pravastatin until his follow-up visit w PCP and Dr. Debara Pickett.  He appears to be close to target LDL under 70, although not quite there.  I wonder whether he would tolerate a more potent statin. 9. DM.   Medication Adjustments/Labs and Tests Ordered: Current medicines are reviewed at length with the patient today.  Concerns regarding medicines are outlined above.  Medication changes, Labs and Tests ordered today are listed in the Patient Instructions below. Patient Instructions  Medication Instructions:  No changes *If you need a refill on your cardiac medications before your next appointment, please call your pharmacy*  Lab Work: None ordered If you have labs (blood work) drawn today and your tests are completely normal, you will receive your results only by: Marland Kitchen MyChart Message (if you have MyChart) OR . A paper copy in the mail If  you have any lab test that is abnormal or we need to change your treatment, we will call you to review the results.  Testing/Procedures: None ordered  Follow-Up: At [facility], you and your health needs are our priority.  As part of our continuing mission to provide you with exceptional heart care, we have created designated Provider Care Teams.  These Care Teams include your primary Cardiologist  (physician) and Advanced Practice Providers (APPs -  Physician Assistants and Nurse Practitioners) who all work together to provide you with the care you need, when you need it.  Your next appointment:   12 months  The format for your next appointment:   In Person  Provider:   Sanda Klein, MD       Signed, Sanda Klein, MD  01/14/2019 10:19 AM    [address], [place], [place]  [address] Phone: [phone]; Fax: [phone]

## 2019-01-21 ENCOUNTER — Encounter: Payer: Medicare Other | Admitting: *Deleted

## 2019-02-08 ENCOUNTER — Encounter: Payer: Self-pay | Admitting: Internal Medicine

## 2019-02-08 ENCOUNTER — Ambulatory Visit: Payer: Medicare Other | Admitting: Internal Medicine

## 2019-02-08 ENCOUNTER — Other Ambulatory Visit: Payer: Self-pay

## 2019-02-08 VITALS — BP 142/90 | HR 73 | Ht 71.0 in | Wt 236.0 lb

## 2019-02-08 DIAGNOSIS — R0602 Shortness of breath: Secondary | ICD-10-CM

## 2019-02-08 DIAGNOSIS — Z79899 Other long term (current) drug therapy: Secondary | ICD-10-CM

## 2019-02-08 DIAGNOSIS — Z951 Presence of aortocoronary bypass graft: Secondary | ICD-10-CM

## 2019-02-08 DIAGNOSIS — I5043 Acute on chronic combined systolic (congestive) and diastolic (congestive) heart failure: Secondary | ICD-10-CM | POA: Diagnosis not present

## 2019-02-08 MED ORDER — FUROSEMIDE 40 MG PO TABS: 40.0000 mg | ORAL_TABLET | Freq: Two times a day (BID) | ORAL | 3 refills | Status: DC

## 2019-02-08 NOTE — Patient Instructions (Signed)
Medication Instructions:  INCREASE lasix to 40mg  twice daily *If you need a refill on your cardiac medications before your next appointment, please call your pharmacy*  Lab Work: BMET & BNP - in 1 week If you have labs (blood work) drawn today and your tests are completely normal, you will receive your results only by: Marland Kitchen MyChart Message (if you have MyChart) OR . A paper copy in the mail If you have any lab test that is abnormal or we need to change your treatment, we will call you to review the results.  Testing/Procedures: NONE  Follow-Up: At [facility], you and your health needs are our priority.  As part of our continuing mission to provide you with exceptional heart care, we have created designated Provider Care Teams.  These Care Teams include your primary Cardiologist (physician) and Advanced Practice Providers (APPs -  Physician Assistants and Nurse Practitioners) who all work together to provide you with the care you need, when you need it.  Your next appointment:   4-6 week(s)  The format for your next appointment:   In Person  Provider:   You may see Pixie Casino, MD or one of the following Advanced Practice Providers on your designated Care Team:    Almyra Deforest, PA-C  Fabian Sharp, PA-C or   Roby Lofts, [place]   Other Instructions

## 2019-02-08 NOTE — Progress Notes (Signed)
OFFICE NOTE  Chief Complaint:  Weight gain, fatigue  Primary Care Physician: Vernie Shanks, MD  HPI:  Jesus Oconnor is a 85 year old gentleman who we have been following closely with a history of 5-vessel CABG and atrial flutter in 2012. He has done well with lack of chest pain; however, he says he does have low energy which has been pretty much stable, not necessarily improved after surgery. He also has significant abnormalities in his EKG as well indicating a conduction delay. When his episode of atrial flutter was occurring his ventricular response was only in the 40s. He was also noted on EKG to have trifascicular block and periods of Wenckebach. Today, he actually had some more Wenckebach and couplets as well as continued trifascicular block. He has had paroxysmal A-flutter and we have kept him off a beta blocker for those reasons. I placed him on a monitor which demonstrated significant bradycardia for which she was symptomatic and ultimately underwent a permanent pacemaker. He reports that this has helped him significantly and recently followed up with Dr. Loletha Grayer in the office. The pacemaker was interrogated and apparently is working properly. Mr. Seufert has no significant complaints other than some right knee pain and itchiness and tenderness along the lower aspect of his midline sternal incision, where he has a keloid scar.  Of note he recently had acute cholecystitis and is status post cholecystectomy.  Mr. Ohanesian returns for followup today. He saw Dr. Loletha Grayer last week for pacemaker check and it seems to be working appropriately. He has had some problems with heart failure. He had fluid gain and it turns out that he was not taking his Lasix on a scheduled basis. He since restarted taking that every day and is feeling much better. He has no complaints of shortness of breath or other symptoms.  08/19/2015  Mr. Meisinger was seen today back in the office for follow-up. I had last seen him  in 2015 and he was recently seen by Dr. Loletha Grayer in the office. This was for device check at which time he was noted to have some worsening fatigue. He is also noted to have some leg swelling. An echocardiogram was performed and unfortunately shows a marked reduction in LVEF to 30-35%. The ventricle was noted to be mildly dilated with diffuse hypokinesis and grade 2 diastolic dysfunction. He is currently on Lasix 40 mg daily and reports his weight is been fairly stable. He denies any orthopnea, PND or significant worsening of dyspnea on exertion. He has reported however fatigue and also denies any chest pain. Blood pressure is elevated today 170/80. When discussing about possible causes of his cardiomyopathy, he does report that he is using at least a moderate amount of alcohol and drinks 3-4 beers at least 3 times a week.  09/09/2015  Mr. Fritchman returns to follow-up his recent heart catheterization. This demonstrated the following:   Mild global LV dysfunction with an ejection fraction of 45-50%. There is significant mitral annular calcification and trace mitral regurgitation.  Mild pulmonary hypertension.  Significant native CAD with 20% mid left main narrowing; diffuse 60-70% proximal and 70% mid LAD stenoses; total occlusion of the proximal circumflex coronary artery with faint antegrade collaterals; and diffusely irregular RCA with mild calcification without apparent high-grade stenosis.  Patent LIMA graft supplying the mid LAD.  Patent sequential vein graft supplying the diagonal 1 and diagonal 2 vessel with mild 30% narrowing in the region of a valve in the proximal portion of  the graft.  Patent vein graft supplying the distal circumflex marginal vessel with smooth 40% narrowing proximally in the region of a valve.  Patent vein graft supplying the distal posterolateral branch of the RCA.  Based on these findings medical therapy was recommended. His filling pressures seem to be well-controlled.  EF was interestingly reported much higher on this study than it was on his echocardiogram. I had recently increased his lisinopril to 20 mg twice a day. We discussed management options today and I feel that he could use better blood pressure control. In addition he recently stopped alcohol completely check commended him on.  11/27/2015  Mr. Loveland returns today for follow-up. After some medication adjustments his blood pressure is now much better controlled 120/70. He denies any chest pain or worsening shortness of breath. We've been continuing to up titrate his medications for heart failure. He's also completely stopped alcohol use. He is due for a remote pacemaker check which is coming up fairly soon. His pacemaker is now been in place for about 4 years. It seems to be AV pacing today.  06/03/2016  Mr. Schlarb is seen today in follow-up. Overall he seems to be doing well. Weight is down about 5 pounds since I last saw him. Is no evidence for heart failure. His weight loss may be attributable to decreased appetite. Blood pressure is good at 124/70. EKG shows a paced rhythm at 75. He says he's had some episodes of palpitations mostly associated with alcohol use which he has significantly reduced if not stopped. He is reporting some bilateral lower extremity swelling. This seems to be worse when sitting for long periods of time and tends to go away almost completely when elevating his feet at night.  12/02/2016  Mr. River returns today for follow-up. In July he underwent right total knee replacement. He had 3 weeks of rehabilitation which she said went well for the right knee however they work the left knee a lot and he had developed some inflammatory arthritis requiring drainage. He says that he's overall walking better. He denies any worsening shortness of breath or heart failure symptoms. Blood pressure is well-controlled today. His pacemaker was assessed this past spring by Dr. Loletha Grayer and is working  appropriately with adequate battery life. He is reporting some right lower extremity swelling in the area where his right knee replacement was. There is some evidence of venous insufficiency.  03/22/2018  Mr. Burruss is seen today in follow-up.  He was scheduled for routine follow-up but also recently had pacemaker interrogation.  This showed a persistent episode of atrial fibrillation that lasted approximately 49 minutes.  He has not had any further episodes.  He has had episodes of PAF and flutter in the past.  He is increasingly being paced with features of heart block.  He does have a history of cardiomyopathy with EF as low as 35 to 40% however was last at about 45% by cath in 2017.  He denies any worsening shortness of breath.  Unfortunately had a fall a few days ago.  He does have some ecchymosis over the right cheek as well as the right anterior chest.  He has soreness in his chest which hurts worse when he coughs or takes a deep breath and I suspect may represent a rib fracture.  That is the only fall that he has had and it was not related to syncope rather he tripped on an uneven sidewalk.  Full dose aspirin.  Recent labs from November 2019  showed total cholesterol 119, HDL 47, LDL 47 and triglycerides 126.  Hemoglobin A1c is 6.7.  Creatinine of 1.2.  08/29/2018  Mr. Mattie has no complaints today. He denies chest pain, shortness of breath or recurrent afib. He recently had remote interrogation of his pacemaker in May which showed normal function and underlying complete heart block, he is pacemaker dependent.  There was a low burden of A. fib less than 1% but with occasional lengthy episodes of A. fib.  We had switched him to Eliquis however due to renal function he is now on the 2.5 mg twice daily dosing.  He does report less bruising on this.  Denies any bleeding.  He does get some lower extremity edema which may be due to a combination of heart failure or his amlodipine.  Blood pressures well  controlled today.  Generally his edema improves with Lasix or elevation.  02/08/2019  Mr. Morton was seen today in follow-up.  He has some complaints of fatigue and some weight gain.  Of note his weight is up from 228 pounds to 236 pounds after recently seeing Dr. Sallyanne Kuster in the office in November.  At that time he was thought to be volume overloaded and up about 8 pounds however his weight is gone up even further by another 8 pounds.  He reports bilateral lower extremity pitting edema which does not go away with elevation.  He also has some fatigue and mild orthopnea.  PMHx:  Past Medical History:  Diagnosis Date  . Anemia    history of  . Arthritis   . Atrial flutter (HCC)    severe bradycardia  . CHF (congestive heart failure) ([place])   . Constipation   . COPD (chronic obstructive pulmonary disease) ([place])   . Coronary artery disease   . Cough   . Diarrhea   . Diastolic dysfunction    Echo 7/0/2637-CHYIF I diastolic dysfunction,EF 02-77%  . DM2 (diabetes mellitus, type 2) ([place])   . Dyslipidemia   . History of kidney stones   . HTN (hypertension)   . Hyperlipidemia   . Leg swelling   . Mobitz type 1 second degree atrioventricular block   . Pacemaker 05/22/2012   Dr  Croitoru    Medtronic Adapta  . 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 SVG to firts & second diagonals,SVG to forth posterolateral branch to RCA  . Sore throat   . Wheezing     Past Surgical History:  Procedure Laterality Date  . CABG x5  09/13/2010   Dr Farrel Conners to LAD,SVG to obtuse marginal,seq. SVG to first & second diagonals,SVG to fourth posterolateral branch RCA  . CARDIAC CATHETERIZATION  09/10/2010   severe LAD,D1,D2 & ostial D2  . CARDIAC CATHETERIZATION N/A 08/28/2015   Procedure: Right/Left Heart Cath and Coronary/Graft Angiography;  Surgeon: Troy Sine, MD;  Location: [facility];  Service: Cardiovascular;  Laterality: N/A;  . CHOLECYSTECTOMY  october 2012  .  CORONARY ARTERY BYPASS GRAFT    . INSERT / REPLACE / REMOVE PACEMAKER  05/22/2012    dual chamber  . PERMANENT PACEMAKER INSERTION N/A 05/22/2012   Procedure: PERMANENT PACEMAKER INSERTION;  Surgeon: Sanda Klein, MD;  Location: [facility];  Service: Cardiovascular;  Laterality: N/A;  . repair of left indirect inguinal hernia with mesh.    . repair of umbilical hernia.    Marland Kitchen TOTAL KNEE ARTHROPLASTY Right 09/26/2016   Procedure: TOTAL KNEE ARTHROPLASTY;  Surgeon: Frederik Pear,  MD;  Location: [facility];  Service: Orthopedics;  Laterality: Right;    FAMHx:  Family History  Problem Relation Age of Onset  . Heart disease Father   . Heart disease Brother        s/p cabg  . Heart disease Maternal Uncle     SOCHx:   reports that he quit smoking about 8 years ago. His smoking use included cigarettes. He quit after 40.00 years of use. He has never used smokeless tobacco. He reports current alcohol use. He reports current drug use. Drug: Marijuana.  ALLERGIES:  Allergies  Allergen Reactions  . No Known Allergies     ROS: Pertinent items noted in HPI and remainder of comprehensive ROS otherwise negative.  HOME MEDS: Current Outpatient Medications  Medication Sig Dispense Refill  . amLODipine (NORVASC) 10 MG tablet Take 1 tablet (10 mg total) by mouth daily. (Patient taking differently: Take 5 mg by mouth daily. ) 90 tablet 3  . apixaban (ELIQUIS) 5 MG TABS tablet Take 2.68m (one-half tablet) by mouth twice daily. 180 tablet 1  . Cholecalciferol (VITAMIN D3) 125 MCG (5000 UT) TABS Take 5,000 Units by mouth daily.    . Cyanocobalamin (B-12) 1000 MCG/ML KIT Inject 1 Dose as directed every 30 (thirty) days.     . ferrous sulfate 325 (65 FE) MG tablet Take 325 mg by mouth daily with breakfast.    . fluticasone furoate-vilanterol (BREO ELLIPTA) 200-25 MCG/INH AEPB Inhale 1 puff into the lungs 2 (two) times daily.    . furosemide (LASIX) 40 MG tablet Take 1 tablet (40 mg total) by mouth daily. 90  tablet 2  . glimepiride (AMARYL) 1 MG tablet Take 1 mg by mouth daily with breakfast.    . hydrALAZINE (APRESOLINE) 25 MG tablet Take 1 tablet (25 mg total) by mouth 3 (three) times daily for 30 days. 90 tablet 0  . metFORMIN (GLUCOPHAGE) 500 MG tablet Take by mouth 2 (two) times daily with a meal.    . metoprolol tartrate (LOPRESSOR) 25 MG tablet TAKE 1 TABLET BY MOUTH  TWICE DAILY 180 tablet 3  . ONETOUCH VERIO test strip daily. As directed  4  . potassium chloride SA (K-DUR) 20 MEQ tablet Take 1 tablet (20 mEq total) by mouth daily. (Patient taking differently: Take 10 mEq by mouth every Monday, Wednesday, and Friday. ) 30 tablet 10  . rosuvastatin (CRESTOR) 40 MG tablet Take 40 mg by mouth daily.    . traZODone (DESYREL) 50 MG tablet Take 50 mg by mouth at bedtime as needed for sleep.     .Grant RutsINHUB 250-50 MCG/DOSE AEPB Inhale 1 puff into the lungs 2 (two) times a day.     No current facility-administered medications for this visit.     LABS/IMAGING: No results found for this or any previous visit (from the past 48 hour(s)). No results found.  VITALS: BP (!) 142/90   Pulse 73   Ht _0  (1.803 m)   Wt 236 lb (107 kg)   BMI 32.92 kg/m   EXAM: General appearance: alert and no distress Neck: no carotid bruit and no JVD Lungs: clear to auscultation bilaterally Heart: regular rate and rhythm, S1, S2 normal, no murmur, click, rub or gallop Abdomen: soft, non-tender; bowel sounds normal; no masses,  no organomegaly Extremities: edema 1-2+ bilateral pitting edema Pulses: 2+ and symmetric Skin: Skin color, texture, turgor normal. No rashes or lesions Neurologic: Grossly normal Psych: Pleasant  EKG: AV dual paced rhythm at  73-personally reviewed  ASSESSMENT: 1. Acute on chronic systolic congestive heart failure 2. Paroxysmal atrial fibrillation-CHADSVASC score of 4 3. Status post right TKR with venous stasis edema 4. Cardiomyopathy with newly reduced EF to 30-35% - no new  obstructive disease by cath, EF 45-50% 5. 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 Hypertension-controlled 7. Dyslipidemia-at goal 8. CABG x 5 vessels (2012) 9. Recent moderate alcohol abuse-now abstinent 10. Dependent edema  PLAN:  1.   Mr. Elms appears to be in acute on chronic systolic congestive heart failure with an increase in lower extremity edema and will require more diuresis.  I recommended increasing his Lasix from 40 mg daily to twice daily.  We will continue to monitor output with a goal to lose the 8 to 10 pounds over the next month.  Check be met in BNP in 1 week.  Follow-up with me in 1 month.  Pixie Casino, MD, [facility], [place] Director of the [facility] Lipid [facility] &  Cardiovascular [facility] Clinic Diplomate of the American Board of Clinical Lipidology Attending Cardiologist  Direct Dial: [phone]  Fax: [phone]  Website:  [email or web address] Neace 02/08/2019, 2:02 PM

## 2019-02-10 ENCOUNTER — Encounter: Payer: Self-pay | Admitting: Internal Medicine

## 2019-02-13 ENCOUNTER — Other Ambulatory Visit: Payer: Self-pay | Admitting: Cardiovascular Disease

## 2019-02-22 ENCOUNTER — Ambulatory Visit (INDEPENDENT_AMBULATORY_CARE_PROVIDER_SITE_OTHER): Payer: Medicare Other | Admitting: *Deleted

## 2019-02-22 DIAGNOSIS — I442 Atrioventricular block, complete: Secondary | ICD-10-CM | POA: Diagnosis not present

## 2019-02-22 LAB — CUP PACEART REMOTE DEVICE CHECK
Battery Impedance: 880 Ohm
Battery Remaining Longevity: 59 mo
Battery Voltage: 2.77 V
Brady Statistic AP VP Percent: 93 %
Brady Statistic AP VS Percent: 0 %
Brady Statistic AS VP Percent: 7 %
Brady Statistic AS VS Percent: 0 %
Date Time Interrogation Session: 20201218102509
Implantable Lead Implant Date: 20140318
Implantable Lead Implant Date: 20140318
Implantable Lead Location: 753859
Implantable Lead Location: 753860
Implantable Lead Model: 5076
Implantable Lead Model: 5076
Implantable Pulse Generator Implant Date: 20140318
Lead Channel Impedance Value: 462 Ohm
Lead Channel Impedance Value: 667 Ohm
Lead Channel Pacing Threshold Amplitude: 0.75 V
Lead Channel Pacing Threshold Amplitude: 0.875 V
Lead Channel Pacing Threshold Pulse Width: 0.4 ms
Lead Channel Pacing Threshold Pulse Width: 0.4 ms
Lead Channel Setting Pacing Amplitude: 2 V
Lead Channel Setting Pacing Amplitude: 2.5 V
Lead Channel Setting Pacing Pulse Width: 0.4 ms
Lead Channel Setting Sensing Sensitivity: 2.8 mV

## 2019-03-08 LAB — BRAIN NATRIURETIC PEPTIDE: BNP: 316.6 pg/mL — ABNORMAL HIGH (ref 0.0–100.0)

## 2019-03-08 LAB — BASIC METABOLIC PANEL
BUN/Creatinine Ratio: 16 (ref 10–24)
BUN: 22 mg/dL (ref 8–27)
CO2: 26 mmol/L (ref 20–29)
Calcium: 9.6 mg/dL (ref 8.6–10.2)
Chloride: 99 mmol/L (ref 96–106)
Creatinine, Ser: 1.41 mg/dL — ABNORMAL HIGH (ref 0.76–1.27)
GFR calc Af Amer: 53 mL/min/{1.73_m2} — ABNORMAL LOW (ref >59)
GFR calc non Af Amer: 46 mL/min/{1.73_m2} — ABNORMAL LOW (ref >59)
Glucose: 151 mg/dL — ABNORMAL HIGH (ref 65–99)
Potassium: 3.5 mmol/L (ref 3.5–5.2)
Sodium: 142 mmol/L (ref 134–144)

## 2019-03-13 NOTE — Progress Notes (Signed)
PPM remote 

## 2019-03-15 ENCOUNTER — Ambulatory Visit: Payer: Medicare Other | Admitting: Internal Medicine

## 2019-03-15 ENCOUNTER — Other Ambulatory Visit: Payer: Self-pay

## 2019-03-15 ENCOUNTER — Encounter: Payer: Self-pay | Admitting: Internal Medicine

## 2019-03-15 VITALS — BP 140/74 | HR 82 | Ht 71.0 in | Wt 226.6 lb

## 2019-03-15 DIAGNOSIS — I5043 Acute on chronic combined systolic (congestive) and diastolic (congestive) heart failure: Secondary | ICD-10-CM

## 2019-03-15 DIAGNOSIS — R0602 Shortness of breath: Secondary | ICD-10-CM | POA: Diagnosis not present

## 2019-03-15 DIAGNOSIS — Z951 Presence of aortocoronary bypass graft: Secondary | ICD-10-CM

## 2019-03-15 MED ORDER — POTASSIUM CHLORIDE CRYS ER 20 MEQ PO TBCR: 20.0000 meq | EXTENDED_RELEASE_TABLET | Freq: Every day | ORAL | 3 refills | Status: DC

## 2019-03-15 NOTE — Patient Instructions (Signed)
Medication Instructions:  INCREASE POTASSIUM TO ONE WHOLE TABLET EVERY DAY *If you need a refill on your cardiac medications before your next appointment, please call your pharmacy*  Lab Work: If you have labs (blood work) drawn today and your tests are completely normal, you will receive your results only by: Marland Kitchen MyChart Message (if you have MyChart) OR . A paper copy in the mail If you have any lab test that is abnormal or we need to change your treatment, we will call you to review the results.    Follow-Up: At [facility], you and your health needs are our priority.  As part of our continuing mission to provide you with exceptional heart care, we have created designated Provider Care Teams.  These Care Teams include your primary Cardiologist (physician) and Advanced Practice Providers (APPs -  Physician Assistants and Nurse Practitioners) who all work together to provide you with the care you need, when you need it.  Your next appointment:   6 month(s)  The format for your next appointment:   Either In Person or Virtual  Provider:   You may see Pixie Casino, MD or one of the following Advanced Practice Providers on your designated Care Team:    Almyra Deforest, PA-C  Fabian Sharp, PA-C or   Roby Lofts, [place]

## 2019-03-15 NOTE — Progress Notes (Signed)
OFFICE NOTE  Chief Complaint:  Follow-up heart failure  Primary Care Physician: Vernie Shanks, MD  HPI:  Jesus Oconnor is a 86 year old gentleman who we have been following closely with a history of 5-vessel CABG and atrial flutter in 2012. He has done well with lack of chest pain; however, he says he does have low energy which has been pretty much stable, not necessarily improved after surgery. He also has significant abnormalities in his EKG as well indicating a conduction delay. When his episode of atrial flutter was occurring his ventricular response was only in the 40s. He was also noted on EKG to have trifascicular block and periods of Wenckebach. Today, he actually had some more Wenckebach and couplets as well as continued trifascicular block. He has had paroxysmal A-flutter and we have kept him off a beta blocker for those reasons. I placed him on a monitor which demonstrated significant bradycardia for which she was symptomatic and ultimately underwent a permanent pacemaker. He reports that this has helped him significantly and recently followed up with Dr. Loletha Grayer in the office. The pacemaker was interrogated and apparently is working properly. Jesus Oconnor has no significant complaints other than some right knee pain and itchiness and tenderness along the lower aspect of his midline sternal incision, where he has a keloid scar.  Of note he recently had acute cholecystitis and is status post cholecystectomy.  Jesus Oconnor returns for followup today. He saw Dr. Loletha Grayer last week for pacemaker check and it seems to be working appropriately. He has had some problems with heart failure. He had fluid gain and it turns out that he was not taking his Lasix on a scheduled basis. He since restarted taking that every day and is feeling much better. He has no complaints of shortness of breath or other symptoms.  08/19/2015  Jesus Oconnor was seen today back in the office for follow-up. I had last seen  him in 2015 and he was recently seen by Dr. Loletha Grayer in the office. This was for device check at which time he was noted to have some worsening fatigue. He is also noted to have some leg swelling. An echocardiogram was performed and unfortunately shows a marked reduction in LVEF to 30-35%. The ventricle was noted to be mildly dilated with diffuse hypokinesis and grade 2 diastolic dysfunction. He is currently on Lasix 40 mg daily and reports his weight is been fairly stable. He denies any orthopnea, PND or significant worsening of dyspnea on exertion. He has reported however fatigue and also denies any chest pain. Blood pressure is elevated today 170/80. When discussing about possible causes of his cardiomyopathy, he does report that he is using at least a moderate amount of alcohol and drinks 3-4 beers at least 3 times a week.  09/09/2015  Jesus Oconnor returns to follow-up his recent heart catheterization. This demonstrated the following:   Mild global LV dysfunction with an ejection fraction of 45-50%. There is significant mitral annular calcification and trace mitral regurgitation.  Mild pulmonary hypertension.  Significant native CAD with 20% mid left main narrowing; diffuse 60-70% proximal and 70% mid LAD stenoses; total occlusion of the proximal circumflex coronary artery with faint antegrade collaterals; and diffusely irregular RCA with mild calcification without apparent high-grade stenosis.  Patent LIMA graft supplying the mid LAD.  Patent sequential vein graft supplying the diagonal 1 and diagonal 2 vessel with mild 30% narrowing in the region of a valve in the proximal portion of  the graft.  Patent vein graft supplying the distal circumflex marginal vessel with smooth 40% narrowing proximally in the region of a valve.  Patent vein graft supplying the distal posterolateral branch of the RCA.  Based on these findings medical therapy was recommended. His filling pressures seem to be  well-controlled. EF was interestingly reported much higher on this study than it was on his echocardiogram. I had recently increased his lisinopril to 20 mg twice a day. We discussed management options today and I feel that he could use better blood pressure control. In addition he recently stopped alcohol completely check commended him on.  11/27/2015  Jesus Oconnor returns today for follow-up. After some medication adjustments his blood pressure is now much better controlled 120/70. He denies any chest pain or worsening shortness of breath. We've been continuing to up titrate his medications for heart failure. He's also completely stopped alcohol use. He is due for a remote pacemaker check which is coming up fairly soon. His pacemaker is now been in place for about 4 years. It seems to be AV pacing today.  06/03/2016  Jesus Oconnor is seen today in follow-up. Overall he seems to be doing well. Weight is down about 5 pounds since I last saw him. Is no evidence for heart failure. His weight loss may be attributable to decreased appetite. Blood pressure is good at 124/70. EKG shows a paced rhythm at 75. He says he's had some episodes of palpitations mostly associated with alcohol use which he has significantly reduced if not stopped. He is reporting some bilateral lower extremity swelling. This seems to be worse when sitting for long periods of time and tends to go away almost completely when elevating his feet at night.  12/02/2016  Jesus Oconnor returns today for follow-up. In July he underwent right total knee replacement. He had 3 weeks of rehabilitation which she said went well for the right knee however they work the left knee a lot and he had developed some inflammatory arthritis requiring drainage. He says that he's overall walking better. He denies any worsening shortness of breath or heart failure symptoms. Blood pressure is well-controlled today. His pacemaker was assessed this past spring by Dr. Loletha Grayer  and is working appropriately with adequate battery life. He is reporting some right lower extremity swelling in the area where his right knee replacement was. There is some evidence of venous insufficiency.  03/22/2018  Jesus Oconnor is seen today in follow-up.  He was scheduled for routine follow-up but also recently had pacemaker interrogation.  This showed a persistent episode of atrial fibrillation that lasted approximately 49 minutes.  He has not had any further episodes.  He has had episodes of PAF and flutter in the past.  He is increasingly being paced with features of heart block.  He does have a history of cardiomyopathy with EF as low as 35 to 40% however was last at about 45% by cath in 2017.  He denies any worsening shortness of breath.  Unfortunately had a fall a few days ago.  He does have some ecchymosis over the right cheek as well as the right anterior chest.  He has soreness in his chest which hurts worse when he coughs or takes a deep breath and I suspect may represent a rib fracture.  That is the only fall that he has had and it was not related to syncope rather he tripped on an uneven sidewalk.  Full dose aspirin.  Recent labs from November 2019  showed total cholesterol 119, HDL 47, LDL 47 and triglycerides 126.  Hemoglobin A1c is 6.7.  Creatinine of 1.2.  08/29/2018  Jesus Oconnor has no complaints today. He denies chest pain, shortness of breath or recurrent afib. He recently had remote interrogation of his pacemaker in May which showed normal function and underlying complete heart block, he is pacemaker dependent.  There was a low burden of A. fib less than 1% but with occasional lengthy episodes of A. fib.  We had switched him to Eliquis however due to renal function he is now on the 2.5 mg twice daily dosing.  He does report less bruising on this.  Denies any bleeding.  He does get some lower extremity edema which may be due to a combination of heart failure or his amlodipine.  Blood  pressures well controlled today.  Generally his edema improves with Lasix or elevation.  02/08/2019  Jesus Oconnor was seen today in follow-up.  He has some complaints of fatigue and some weight gain.  Of note his weight is up from 228 pounds to 236 pounds after recently seeing Dr. Sallyanne Kuster in the office in November.  At that time he was thought to be volume overloaded and up about 8 pounds however his weight is gone up even further by another 8 pounds.  He reports bilateral lower extremity pitting edema which does not go away with elevation.  He also has some fatigue and mild orthopnea.   03/15/2019  Jesus Oconnor is seen today in follow-up for heart failure.  He has done very well on increased dose of Lasix.  Instead of 40 twice a day he is taking 80 mg in the morning.  Nonetheless he is down 10 pounds.  Creatinine is improved from 2.2 down to 1.4.  His BNP was mildly elevated as of the end of December at 300+.  Overall he feels better and says his leg pain is improved as well.  He was noted to be borderline hypokalemic with a potassium of 3.5.  He says he is only taking 1/2 tablet of the 20 mEq supplement.  He recently ran out but has more been delivered.  PMHx:  Past Medical History:  Diagnosis Date   Anemia    history of   Arthritis    Atrial flutter (HCC)    severe bradycardia   CHF (congestive heart failure) (HCC)    Constipation    COPD (chronic obstructive pulmonary disease) (HCC)    Coronary artery disease    Cough    Diarrhea    Diastolic dysfunction    Echo 06/12/8889-[id number] I diastolic dysfunction,EF 38-88%   DM2 (diabetes mellitus, type 2) (HCC)    Dyslipidemia    History of kidney stones    HTN (hypertension)    Hyperlipidemia    Leg swelling    Mobitz type 1 second degree atrioventricular block    Pacemaker 05/22/2012   Dr  Croitoru    Medtronic Adapta   S/P CABG x 5 09/13/2010   LIMA to LAD,SVG to obtuse marginal,seq. SVG to firts & second diagonals,SVG to  forth posterolateral branch to RCA   Sore throat    Wheezing     Past Surgical History:  Procedure Laterality Date   CABG x5  09/13/2010   Dr Farrel Conners to LAD,SVG to obtuse marginal,seq. SVG to first & second diagonals,SVG to fourth posterolateral branch RCA   CARDIAC CATHETERIZATION  09/10/2010   severe LAD,D1,D2 & ostial D2   CARDIAC CATHETERIZATION N/A 08/28/2015  Procedure: Right/Left Heart Cath and Coronary/Graft Angiography;  Surgeon: Troy Sine, MD;  Location: [facility];  Service: Cardiovascular;  Laterality: N/A;   CHOLECYSTECTOMY  october 2012   CORONARY ARTERY BYPASS GRAFT     INSERT / REPLACE / REMOVE PACEMAKER  05/22/2012    dual chamber   PERMANENT PACEMAKER INSERTION N/A 05/22/2012   Procedure: PERMANENT PACEMAKER INSERTION;  Surgeon: Sanda Klein, MD;  Location: [facility];  Service: Cardiovascular;  Laterality: N/A;   repair of left indirect inguinal hernia with mesh.     repair of umbilical hernia.     TOTAL KNEE ARTHROPLASTY Right 09/26/2016   Procedure: TOTAL KNEE ARTHROPLASTY;  Surgeon: Frederik Pear, MD;  Location: [facility];  Service: Orthopedics;  Laterality: Right;    FAMHx:  Family History  Problem Relation Age of Onset   Heart disease Father    Heart disease Brother        s/p cabg   Heart disease Maternal Uncle     SOCHx:   reports that he quit smoking about 8 years ago. His smoking use included cigarettes. He quit after 40.00 years of use. He has never used smokeless tobacco. He reports current alcohol use. He reports current drug use. Drug: Marijuana.  ALLERGIES:  Allergies  Allergen Reactions   No Known Allergies     ROS: Pertinent items noted in HPI and remainder of comprehensive ROS otherwise negative.  HOME MEDS: Current Outpatient Medications  Medication Sig Dispense Refill   amLODipine (NORVASC) 10 MG tablet TAKE 1 TABLET BY MOUTH  DAILY 90 tablet 3   apixaban (ELIQUIS) 5 MG TABS tablet Take 2.'5mg'$  (one-half  tablet) by mouth twice daily. 180 tablet 1   Cholecalciferol (VITAMIN D3) 125 MCG (5000 UT) TABS Take 5,000 Units by mouth daily.     Cyanocobalamin (B-12) 1000 MCG/ML KIT Inject 1 Dose as directed every 30 (thirty) days.      ferrous sulfate 325 (65 FE) MG tablet Take 325 mg by mouth daily with breakfast.     fluticasone furoate-vilanterol (BREO ELLIPTA) 200-25 MCG/INH AEPB Inhale 1 puff into the lungs 2 (two) times daily.     furosemide (LASIX) 40 MG tablet Take 1 tablet (40 mg total) by mouth 2 (two) times daily. 180 tablet 3   glimepiride (AMARYL) 1 MG tablet Take 1 mg by mouth daily with breakfast.     hydrALAZINE (APRESOLINE) 25 MG tablet Take 1 tablet (25 mg total) by mouth 3 (three) times daily for 30 days. 90 tablet 0   metFORMIN (GLUCOPHAGE) 500 MG tablet Take by mouth 2 (two) times daily with a meal.     metoprolol tartrate (LOPRESSOR) 25 MG tablet TAKE 1 TABLET BY MOUTH  TWICE DAILY 180 tablet 3   ONETOUCH VERIO test strip daily. As directed  4   potassium chloride SA (K-DUR) 20 MEQ tablet Take 1 tablet (20 mEq total) by mouth daily. (Patient taking differently: Take 10 mEq by mouth every Monday, Wednesday, and Friday. ) 30 tablet 10   rosuvastatin (CRESTOR) 40 MG tablet Take 40 mg by mouth daily.     traZODone (DESYREL) 50 MG tablet Take 50 mg by mouth at bedtime as needed for sleep.      WIXELA INHUB 250-50 MCG/DOSE AEPB Inhale 1 puff into the lungs 2 (two) times a day.     No current facility-administered medications for this visit.    LABS/IMAGING: No results found for this or any previous visit (from the past 48  hour(s)). No results found.  VITALS: BP 140/74 (BP Location: Left Arm, Patient Position: Sitting, Cuff Size: Large)    Pulse 82    Ht '5\' 11"'$  (1.803 m)    Wt 226 lb 9.6 oz (102.8 kg)    SpO2 97%    BMI 31.60 kg/m   EXAM: General appearance: alert and no distress Neck: no carotid bruit and no JVD Lungs: clear to auscultation bilaterally Heart:  regular rate and rhythm, S1, S2 normal, no murmur, click, rub or gallop Abdomen: soft, non-tender; bowel sounds normal; no masses,  no organomegaly Extremities: edema trace to 1+ bilateral pitting edema Pulses: 2+ and symmetric Skin: Skin color, texture, turgor normal. No rashes or lesions Neurologic: Grossly normal Psych: Pleasant  EKG: Deferred  ASSESSMENT: 1. Acute on chronic systolic congestive heart failure 2. Paroxysmal atrial fibrillation-CHADSVASC score of 4 3. Status post right TKR with venous stasis edema 4. Cardiomyopathy with newly reduced EF to 30-35% - no new obstructive disease by cath, EF 45-50% 5. 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 Hypertension-controlled 7. Dyslipidemia-at goal 8. CABG x 5 vessels (2012) 9. Recent moderate alcohol abuse-now abstinent 10. Dependent edema  PLAN:  1.   Jesus Oconnor has had significant improvement in his congestive heart failure.  His weight is down 10 pounds and his swelling is improved significantly.  He will need to remain on the increased dose of Lasix.  I also asked him to increase his potassium to least a full tablet if not 2 tablets a day.  We will continue to follow that closely.  Plan follow-up in about 6 months or sooner if necessary.  Pixie Casino, MD, [facility], [place] Director of the [facility]  [facility] Diplomate of the American Board of Clinical Lipidology Attending Cardiologist  Direct Dial: [phone]   Fax: [phone]  Website:  [email or web address] [email or web address] Daegen Berrocal 03/15/2019, 10:57 AM

## 2019-03-21 ENCOUNTER — Other Ambulatory Visit: Payer: Self-pay

## 2019-05-24 ENCOUNTER — Ambulatory Visit (INDEPENDENT_AMBULATORY_CARE_PROVIDER_SITE_OTHER): Payer: Medicare Other | Admitting: *Deleted

## 2019-05-24 DIAGNOSIS — I442 Atrioventricular block, complete: Secondary | ICD-10-CM

## 2019-05-25 LAB — CUP PACEART REMOTE DEVICE CHECK
Battery Impedance: 905 Ohm
Battery Remaining Longevity: 61 mo
Battery Voltage: 2.78 V
Brady Statistic AP VP Percent: 86 %
Brady Statistic AP VS Percent: 0 %
Brady Statistic AS VP Percent: 14 %
Brady Statistic AS VS Percent: 0 %
Date Time Interrogation Session: 20210319144818
Implantable Lead Implant Date: 20140318
Implantable Lead Implant Date: 20140318
Implantable Lead Location: 753859
Implantable Lead Location: 753860
Implantable Lead Model: 5076
Implantable Lead Model: 5076
Implantable Pulse Generator Implant Date: 20140318
Lead Channel Impedance Value: 475 Ohm
Lead Channel Impedance Value: 723 Ohm
Lead Channel Pacing Threshold Amplitude: 0.625 V
Lead Channel Pacing Threshold Amplitude: 0.875 V
Lead Channel Pacing Threshold Pulse Width: 0.4 ms
Lead Channel Pacing Threshold Pulse Width: 0.4 ms
Lead Channel Setting Pacing Amplitude: 2 V
Lead Channel Setting Pacing Amplitude: 2.5 V
Lead Channel Setting Pacing Pulse Width: 0.4 ms
Lead Channel Setting Sensing Sensitivity: 2.8 mV

## 2019-05-27 ENCOUNTER — Telehealth: Payer: Self-pay | Admitting: Emergency Medicine

## 2019-05-27 NOTE — Telephone Encounter (Signed)
His labs are only mildly abnormal-I reviewed them in January.  I would advise no changes in his medications based on his lack of symptoms and edema.  Dr. Debara Pickett

## 2019-05-27 NOTE — Telephone Encounter (Signed)
Patient contacted due to persistant AF . Confirmed he is taking his Eliquis as directed. No SOB, no CP, or increased swelling in his BLE.  Patient has questions concerning labs drawn by Dr Debara Pickett. Will forward to Dr Debara Pickett to address.

## 2019-06-17 ENCOUNTER — Encounter: Payer: Self-pay | Admitting: Cardiovascular Disease

## 2019-06-17 ENCOUNTER — Other Ambulatory Visit: Payer: Self-pay

## 2019-06-17 ENCOUNTER — Ambulatory Visit: Payer: Medicare Other | Admitting: Cardiovascular Disease

## 2019-06-17 VITALS — BP 144/80 | HR 73 | Ht 71.0 in | Wt 221.0 lb

## 2019-06-17 DIAGNOSIS — I480 Paroxysmal atrial fibrillation: Secondary | ICD-10-CM

## 2019-06-17 DIAGNOSIS — E785 Hyperlipidemia, unspecified: Secondary | ICD-10-CM

## 2019-06-17 DIAGNOSIS — I25810 Atherosclerosis of coronary artery bypass graft(s) without angina pectoris: Secondary | ICD-10-CM

## 2019-06-17 DIAGNOSIS — I442 Atrioventricular block, complete: Secondary | ICD-10-CM | POA: Diagnosis not present

## 2019-06-17 DIAGNOSIS — I484 Atypical atrial flutter: Secondary | ICD-10-CM | POA: Diagnosis not present

## 2019-06-17 DIAGNOSIS — Z950 Presence of cardiac pacemaker: Secondary | ICD-10-CM | POA: Diagnosis not present

## 2019-06-17 DIAGNOSIS — I5022 Chronic systolic (congestive) heart failure: Secondary | ICD-10-CM | POA: Diagnosis not present

## 2019-06-17 DIAGNOSIS — J449 Chronic obstructive pulmonary disease, unspecified: Secondary | ICD-10-CM

## 2019-06-17 DIAGNOSIS — I10 Essential (primary) hypertension: Secondary | ICD-10-CM

## 2019-06-17 DIAGNOSIS — E1159 Type 2 diabetes mellitus with other circulatory complications: Secondary | ICD-10-CM

## 2019-06-17 NOTE — Patient Instructions (Signed)

## 2019-06-17 NOTE — Progress Notes (Signed)
Patient ID: Jesus Oconnor, male   DOB: [birth year], 86 y.o.   MRN: [id number]     Cardiology Office Note    Date:  06/17/2019   ID:  Jesus Oconnor, DOB [birth year], MRN [id number]  PCP:  Vernie Shanks, MD  Cardiologist:   Sanda Klein, MD   Chief Complaint  Patient presents with  . Follow-up    CHF, CAD, AV block, pacemaker     History of Present Illness:  Jesus Oconnor is a 86 y.o. 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  He came in today for an episode of persistent atrial flutter that was documented by his pacemaker is starting on January 16, uninterrupted since then.  He does not appear to be aware of the arrhythmia.  His diuretic requirements have not increased.  He denies any change in his exertional complaints (he is limited more by pain in his left knee than by shortness of breath).  He does not have angina pectoris and has not experienced palpitations or syncope.  His weight is down 15 pounds compared to his last appointment with me when he had significant edema.  At this point he only has 1+ pretibial pitting edema little more prominent on the right than on the left.  He had right total knee replacement for severe arthritis.  This is also the site of his saphenous vein harvest for his previous bypass. The right calf swells a little more than the left.    He has not had any falls or bleeding problems and denies focal neurological complaints.  In 2017, he developed edema and shortness of breath and echocardiography showed an EF had dropped to 30-35 %. On 08/28/2015 he underwent right and left heart catheterization. Estimated LVEF was better at 45-50 %. He had severe  native coronary disease, but all his bypasses (LIMA to LAD, SVG sequential diagonal 1-diagonal 2, SVG to distal circumflex, SVG to PLA branch of RCA) were widely patent. Medical therapy was recommended.  He saw Dr. Debara Pickett in January 2021 and was in normal rhythm at the time.  Except for the presence of atrial flutter, pacemaker interrogation shows normal function.  He has 100% ventricular paced rhythm.  Estimated generator longevity is 5 years.  Atrial lead parameters could not be tested, but the ventricular lead function is normal with stable impedance and pacing.  There were no sensed R waves.  Prior to the onset of atrial flutter he did not have any detectable atrial activity and he does not have an idioventricular escape rhythm.  He is pacemaker dependent.   Past Medical History:  Diagnosis Date  . Anemia    history of  . Arthritis   . Atrial flutter (HCC)    severe bradycardia  . CHF (congestive heart failure) ([place])   . Constipation   . COPD (chronic obstructive pulmonary disease) ([place])   . Coronary artery disease   . Cough   . Diarrhea   . Diastolic dysfunction    Echo 08/06/6071-XTGGY I diastolic dysfunction,EF 69-48%  . DM2 (diabetes mellitus, type 2) ([place])   . Dyslipidemia   . History of kidney stones   . HTN (hypertension)   . Hyperlipidemia   . Leg swelling   . Mobitz type 1 second degree atrioventricular block   .  Pacemaker 05/22/2012   Dr  Pritesh Sobecki    Medtronic Adapta  . 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 SVG to firts & second diagonals,SVG to forth posterolateral branch to RCA  . Sore throat   . Wheezing     Past Surgical History:  Procedure Laterality Date  . CABG x5  09/13/2010   Dr Farrel Conners to LAD,SVG to obtuse marginal,seq. SVG to first & second diagonals,SVG to fourth posterolateral branch RCA  . CARDIAC CATHETERIZATION  09/10/2010   severe LAD,D1,D2 & ostial D2  . CARDIAC CATHETERIZATION N/A 08/28/2015   Procedure: Right/Left  Heart Cath and Coronary/Graft Angiography;  Surgeon: Troy Sine, MD;  Location: [facility];  Service: Cardiovascular;  Laterality: N/A;  . CHOLECYSTECTOMY  october 2012  . CORONARY ARTERY BYPASS GRAFT    . INSERT / REPLACE / REMOVE PACEMAKER  05/22/2012    dual chamber  . PERMANENT PACEMAKER INSERTION N/A 05/22/2012   Procedure: PERMANENT PACEMAKER INSERTION;  Surgeon: Sanda Klein, MD;  Location: [facility];  Service: Cardiovascular;  Laterality: N/A;  . repair of left indirect inguinal hernia with mesh.    . repair of umbilical hernia.    Marland Kitchen TOTAL KNEE ARTHROPLASTY Right 09/26/2016   Procedure: TOTAL KNEE ARTHROPLASTY;  Surgeon: Frederik Pear, MD;  Location: [facility];  Service: Orthopedics;  Laterality: Right;    Current Medications: Outpatient Medications Prior to Visit  Medication Sig Dispense Refill  . amLODipine (NORVASC) 10 MG tablet TAKE 1 TABLET BY MOUTH  DAILY (Patient taking differently: Take 0.5 mg by mouth in the morning and at bedtime. ) 90 tablet 3  . apixaban (ELIQUIS) 5 MG TABS tablet Take 2.'5mg'$  (one-half tablet) by mouth twice daily. 180 tablet 1  . Cholecalciferol (VITAMIN D3) 125 MCG (5000 UT) TABS Take 5,000 Units by mouth daily.    . Cyanocobalamin (B-12) 1000 MCG/ML KIT Inject 1 Dose as directed every 30 (thirty) days.     . ferrous sulfate 325 (65 FE) MG tablet Take 325 mg by mouth daily with breakfast.    . fluticasone furoate-vilanterol (BREO ELLIPTA) 200-25 MCG/INH AEPB Inhale 1 puff into the lungs 2 (two) times daily.    . furosemide (LASIX) 40 MG tablet Take 1 tablet (40 mg total) by mouth 2 (two) times daily. 180 tablet 3  . glimepiride (AMARYL) 1 MG tablet Take 1 mg by mouth daily with breakfast.    . hydrALAZINE (APRESOLINE) 25 MG tablet Take 1 tablet (25 mg total) by mouth 3 (three) times daily for 30 days. 90 tablet 0  . metFORMIN (GLUCOPHAGE) 500 MG tablet Take by mouth 2 (two) times daily with a meal.    . metoprolol tartrate (LOPRESSOR) 25 MG  tablet TAKE 1 TABLET BY MOUTH  TWICE DAILY 180 tablet 3  . ONETOUCH VERIO test strip daily. As directed  4  . potassium chloride SA (KLOR-CON) 20 MEQ tablet Take 1 tablet (20 mEq total) by mouth daily. 90 tablet 3  . rosuvastatin (CRESTOR) 40 MG tablet Take 40 mg by mouth daily.    . traZODone (DESYREL) 50 MG tablet Take 50 mg by mouth at bedtime as needed for sleep.     Grant Ruts INHUB 250-50 MCG/DOSE AEPB Inhale 1 puff into the lungs 2 (two) times a day.     No facility-administered medications prior to visit.     Allergies:   No known allergies   Social History   Socioeconomic History  . Marital  status: Widowed    Spouse name: Not on file  . Number of children: 2  . Years of education: Not on file  . Highest education level: Not on file  Occupational History  . Not on file  Tobacco Use  . Smoking status: Former Smoker    Years: 40.00    Types: Cigarettes    Quit date: 08/06/2010    Years since quitting: 8.8  . Smokeless tobacco: Never Used  Substance and Sexual Activity  . Alcohol use: Yes    Comment: 5-6 beers per week  . Drug use: Yes    Types: Marijuana    Comment: couple times a month  . Sexual activity: Not on file  Other Topics Concern  . Not on file  Social History Narrative  . Not on file   Social Determinants of Health   Financial Resource Strain:   . Difficulty of Paying Living Expenses:   Food Insecurity:   . Worried About Charity fundraiser in the Last Year:   . Arboriculturist in the Last Year:   Transportation Needs:   . Film/video editor (Medical):   Marland Kitchen Lack of Transportation (Non-Medical):   Physical Activity:   . Days of Exercise per Week:   . Minutes of Exercise per Session:   Stress:   . Feeling of Stress :   Social Connections:   . Frequency of Communication with Friends and Family:   . Frequency of Social Gatherings with Friends and Family:   . Attends Religious Services:   . Active Member of Clubs or Organizations:   . Attends  Archivist Meetings:   Marland Kitchen Marital Status:      Family History:  The patient's family history includes Heart disease in his brother, father, and maternal uncle.   ROS:   Please see the history of present illness.    ROS All other systems are reviewed and are negative.   PHYSICAL EXAM:   VS:  BP (!) 144/80   Pulse 73   Ht '5\' 11"'$  (1.803 m)   Wt 221 lb (100.2 kg)   BMI 30.82 kg/m      General: Alert, oriented x3, no distress, obese. Healthy L subclavian PM site. Head: no evidence of trauma, PERRL, EOMI, no exophtalmos or lid lag, no myxedema, no xanthelasma; normal ears, nose and oropharynx Neck: normal jugular venous pulsations and no hepatojugular reflux; brisk carotid pulses without delay and no carotid bruits Chest: clear to auscultation, no signs of consolidation by percussion or palpation, normal fremitus, symmetrical and full respiratory excursions Cardiovascular: normal position and quality of the apical impulse, regular rhythm, normal first and second heart sounds, no murmurs, rubs or gallops Abdomen: no tenderness or distention, no masses by palpation, no abnormal pulsatility or arterial bruits, normal bowel sounds, no hepatosplenomegaly Extremities: no clubbing, cyanosis; bilateral 2+ calf edema; 2+ radial, ulnar and brachial pulses bilaterally; 2+ right femoral, posterior tibial and dorsalis pedis pulses; 2+ left femoral, posterior tibial and dorsalis pedis pulses; no subclavian or femoral bruits Neurological: grossly nonfocal Psych: Normal mood and affect   Wt Readings from Last 3 Encounters:  06/17/19 221 lb (100.2 kg)  03/15/19 226 lb 9.6 oz (102.8 kg)  02/08/19 236 lb (107 kg)   Studies/Labs Reviewed:   EKG:  EKG is ordered today.  The ekg ordered today demonstrates 100% AV pacing  Recent Labs: 06/23/2018: ALT 11 06/28/2018: Hemoglobin 8.3; Platelets 147 03/07/2019: BNP 316.6; BUN 22; Creatinine, Ser 1.41; Potassium  3.5; Sodium 142   Lipid Panel Jan  2019 LDL 78  ASSESSMENT:    1. Paroxysmal atrial fibrillation (HCC)   2. Atypical atrial flutter (HCC)   3. Chronic systolic CHF (congestive heart failure) ([place])   4. Coronary artery disease involving coronary bypass graft of native heart without angina pectoris   5. Essential hypertension   6. Chronic obstructive pulmonary disease, unspecified COPD type ([place])   7. CHB (complete heart block) (HCC)   8. Pacemaker   9. Hyperlipidemia LDL goal <70   10. Type 2 diabetes mellitus with other circulatory complication, without long-term current use of insulin (HCC)      PLAN:  In order of problems listed above:  1. Atrial flutter: He has been compliant with anticoagulation without any missed doses.  We attempted overdrive burst atrial pacing today via the device.  Starting with cycle length of 300 ms, increasingly more rapid burst atrial pacing was performed.  At rates under 250 ms there appeared to be entrainment of the reentrant circuit, but this continued unabated when pacing was stopped.  Once pacing at 180 ms was performed, the rhythm briefly deteriorated to atrial fibrillation, after which it reorganized to atrial flutter.  Previous history of paroxysmal atrial fibrillation.  Reviewed the importance of uninterrupted anticoagulation.  Discussed the option for antiarrhythmic therapy and/or electrical cardioversion with sedation, but both the patient and I agree that this is probably unnecessary since the arrhythmia is completely asymptomatic.  Will discuss with Dr. Debara Pickett. 2. CHF: Had an episode of heart failure exacerbation when he was in atrial fibrillation last fall, readily resolved with enhanced diuretic therapy.  He seems to be tolerating the current arrhythmia without any adverse effects on heart failure. 3. CAD s/p CABG: Native vessels occluded, patent grafts at cardiac catheterization in 2017.  He does not have angina pectoris.   4. HTN: Excellent control 5. COPD: Had some problems with  wheezing on carvedilol, tolerating metoprolol without problems. 6. CHB: There was no underlying escape rhythm today. 7. PPM: Normal device function. May be a candidate for CRT upgrade if LVEF declines further  in the future. Continue remote downloads every 3 months and yearly office visits. 8. HLP: On rosuvastatin.  Has achieved target LDL less than 70.   9. DM: Recent hemoglobin A1c slightly elevated at 7.8%.   Medication Adjustments/Labs and Tests Ordered: Current medicines are reviewed at length with the patient today.  Concerns regarding medicines are outlined above.  Medication changes, Labs and Tests ordered today are listed in the Patient Instructions below. Patient Instructions  Medication Instructions:  No changes *If you need a refill on your cardiac medications before your next appointment, please call your pharmacy*   Lab Work: None ordered If you have labs (blood work) drawn today and your tests are completely normal, you will receive your results only by: Marland Kitchen MyChart Message (if you have MyChart) OR . A paper copy in the mail If you have any lab test that is abnormal or we need to change your treatment, we will call you to review the results.   Testing/Procedures: None ordered   Follow-Up: At [facility], you and your health needs are our priority.  As part of our continuing mission to provide you with exceptional heart care, we have created designated Provider Care Teams.  These Care Teams include your primary Cardiologist (physician) and Advanced Practice Providers (APPs -  Physician Assistants and Nurse Practitioners) who all work together to provide you with the care  you need, when you need it.  We recommend signing up for the patient portal called "MyChart".  Sign up information is provided on this After Visit Summary.  MyChart is used to connect with patients for Virtual Visits (Telemedicine).  Patients are able to view lab/test results, encounter notes, upcoming  appointments, etc.  Non-urgent messages can be sent to your provider as well.   To learn more about what you can do with MyChart, go to [email or web address].    Your next appointment:   12 month(s)  The format for your next appointment:   In Person  Provider:   Sanda Klein, MD       Signed, Sanda Klein, MD  06/17/2019 2:32 PM    [facility] [address], [place], [place]  [address] Phone: [phone]; Fax: [phone]

## 2019-07-03 ENCOUNTER — Telehealth: Payer: Self-pay | Admitting: Cardiovascular Disease

## 2019-07-03 NOTE — Telephone Encounter (Signed)
New Message:   Please call [name]. She wants to discuss pt's last office visit on 06-17-19 and also a question about his medicine.,

## 2019-07-03 NOTE — Telephone Encounter (Signed)
The patient's daughter was calling to verify the dosage of Amlodipine and Eliquis.   She has bee advised that we have the patient on: Amlodipine 5 mg bid Eliquis 2.5 mg bid.  She will call back if anything is different.

## 2019-07-15 ENCOUNTER — Other Ambulatory Visit: Payer: Self-pay | Admitting: Cardiovascular Disease

## 2019-07-26 ENCOUNTER — Telehealth: Payer: Self-pay | Admitting: Internal Medicine

## 2019-07-26 NOTE — Telephone Encounter (Signed)
Pts wife, pam, called to report the pt has been declining since he last saw Dr. Sallyanne Kuster 06/17/19... they had discussed possible cardioversion then but he was doing well and was asymptomatic so they planned to hold off for now.   Pts daughter is reporting that he feels his heart is out of rhythm... he gives out easier with minimal exertion such as walking to his car outside.. he is SOB with exertion, and he is very tired most of the time.   He denies peripheral edema but has been coughing for a few weeks with some sputum production. No chest pain or fever.   She had called his PCP Dr. Jacelyn Grip and he mentioned calling in antibiotics for the chest congestion but then recommended he go tot he ED or call Dr. Debara Pickett with his cardiac history...he did not feel comfortable treating him.   Will need to forward to Dr. Debara Pickett for review.  He is not due to see him until 09/2019.

## 2019-07-26 NOTE — Telephone Encounter (Signed)
Patient c/o Palpitations:  High priority if patient c/o lightheadedness, shortness of breath, or chest pain  1) How long have you had palpitations/irregular HR/ Afib? Are you having the symptoms now? Afib and yes  2) Are you currently experiencing lightheadedness, SOB or CP? Gets SOB when up walking around but denies these symptoms right now  3) Do you have a history of afib (atrial fibrillation) or irregular heart rhythm? afib  4) Have you checked your BP or HR? (document readings if available): will check and have ready for when nurse calls back.   5) Are you experiencing any other symptoms? Chest congestion. Blood sugar is running high.

## 2019-07-26 NOTE — Telephone Encounter (Signed)
Try to get him in sooner - can see APP - if he gets significantly worse, could present to the ER.  Dr Lemmie Evens

## 2019-07-29 ENCOUNTER — Telehealth: Payer: Self-pay

## 2019-07-29 ENCOUNTER — Telehealth: Payer: Self-pay | Admitting: Cardiology

## 2019-07-29 NOTE — Telephone Encounter (Signed)
Left message for patient to call back to schedule office visit with APP

## 2019-07-29 NOTE — Telephone Encounter (Signed)
New Message:    Daughter says she will need to come in with pt for his appt on 08-01-26 with Lurena Joiner Dhe says he needs her assistant.Marland Kitchen

## 2019-07-29 NOTE — Telephone Encounter (Signed)
Left a message with [name] letting her know that she can attend her fathers appt.

## 2019-07-30 ENCOUNTER — Telehealth: Payer: Self-pay | Admitting: Internal Medicine

## 2019-07-30 NOTE — Telephone Encounter (Signed)
Called patient to get scheduled 07/30/19, patient is already scheduled 08/01/2019

## 2019-08-01 ENCOUNTER — Ambulatory Visit: Payer: Medicare Other | Admitting: Cardiology

## 2019-08-01 ENCOUNTER — Encounter: Payer: Self-pay | Admitting: Cardiology

## 2019-08-01 ENCOUNTER — Other Ambulatory Visit: Payer: Self-pay

## 2019-08-01 VITALS — BP 114/74 | HR 110 | Temp 97.4°F | Ht 71.0 in | Wt 215.0 lb

## 2019-08-01 DIAGNOSIS — I2510 Atherosclerotic heart disease of native coronary artery without angina pectoris: Secondary | ICD-10-CM

## 2019-08-01 DIAGNOSIS — I4891 Unspecified atrial fibrillation: Secondary | ICD-10-CM | POA: Diagnosis not present

## 2019-08-01 DIAGNOSIS — E119 Type 2 diabetes mellitus without complications: Secondary | ICD-10-CM

## 2019-08-01 DIAGNOSIS — I480 Paroxysmal atrial fibrillation: Secondary | ICD-10-CM

## 2019-08-01 DIAGNOSIS — I5033 Acute on chronic diastolic (congestive) heart failure: Secondary | ICD-10-CM

## 2019-08-01 DIAGNOSIS — I10 Essential (primary) hypertension: Secondary | ICD-10-CM | POA: Diagnosis not present

## 2019-08-01 DIAGNOSIS — Z950 Presence of cardiac pacemaker: Secondary | ICD-10-CM

## 2019-08-01 DIAGNOSIS — D649 Anemia, unspecified: Secondary | ICD-10-CM

## 2019-08-01 DIAGNOSIS — Z951 Presence of aortocoronary bypass graft: Secondary | ICD-10-CM

## 2019-08-01 DIAGNOSIS — I255 Ischemic cardiomyopathy: Secondary | ICD-10-CM

## 2019-08-01 DIAGNOSIS — Z794 Long term (current) use of insulin: Secondary | ICD-10-CM

## 2019-08-01 LAB — COMPREHENSIVE METABOLIC PANEL
ALT: 8 IU/L (ref 0–44)
AST: 17 IU/L (ref 0–40)
Albumin/Globulin Ratio: 1.8 (ref 1.2–2.2)
Albumin: 4.8 g/dL — ABNORMAL HIGH (ref 3.6–4.6)
Alkaline Phosphatase: 50 IU/L (ref 48–121)
BUN/Creatinine Ratio: 14 (ref 10–24)
BUN: 22 mg/dL (ref 8–27)
Bilirubin Total: 0.8 mg/dL (ref 0.0–1.2)
CO2: 25 mmol/L (ref 20–29)
Calcium: 9.6 mg/dL (ref 8.6–10.2)
Chloride: 98 mmol/L (ref 96–106)
Creatinine, Ser: 1.62 mg/dL — ABNORMAL HIGH (ref 0.76–1.27)
GFR calc Af Amer: 44 mL/min/{1.73_m2} — ABNORMAL LOW (ref >59)
GFR calc non Af Amer: 38 mL/min/{1.73_m2} — ABNORMAL LOW (ref >59)
Globulin, Total: 2.6 g/dL (ref 1.5–4.5)
Glucose: 169 mg/dL — ABNORMAL HIGH (ref 65–99)
Potassium: 4 mmol/L (ref 3.5–5.2)
Sodium: 141 mmol/L (ref 134–144)
Total Protein: 7.4 g/dL (ref 6.0–8.5)

## 2019-08-01 LAB — TSH: TSH: 2.25 u[IU]/mL (ref 0.450–4.500)

## 2019-08-01 LAB — CBC
Hematocrit: 36.2 % — ABNORMAL LOW (ref 37.5–51.0)
Hemoglobin: 11.8 g/dL — ABNORMAL LOW (ref 13.0–17.7)
MCH: 27.6 pg (ref 26.6–33.0)
MCHC: 32.6 g/dL (ref 31.5–35.7)
MCV: 85 fL (ref 79–97)
Platelets: 188 10*3/uL (ref 150–450)
RBC: 4.28 x10E6/uL (ref 4.14–5.80)
RDW: 14.5 % (ref 11.6–15.4)
WBC: 7.2 10*3/uL (ref 3.4–10.8)

## 2019-08-01 MED ORDER — APIXABAN 5 MG PO TABS: 5.0000 mg | ORAL_TABLET | Freq: Two times a day (BID) | ORAL | 1 refills | Status: DC

## 2019-08-01 MED ORDER — AMIODARONE HCL 200 MG PO TABS
200.0000 mg | ORAL_TABLET | Freq: Two times a day (BID) | ORAL | 1 refills | Status: DC
Start: 2019-08-01 — End: 2019-08-13

## 2019-08-01 NOTE — Assessment & Plan Note (Signed)
BP stable.

## 2019-08-01 NOTE — Assessment & Plan Note (Signed)
I suspect the patient is in AF and that is causing his symptoms of fatigue

## 2019-08-01 NOTE — Assessment & Plan Note (Signed)
MDT 2014

## 2019-08-01 NOTE — Assessment & Plan Note (Signed)
CABG x5 using a LIMA graft to LAD, sequential SVG to the first and second diagonal branches of the LAD, an SVG to the OM branch of the left circumflex artery, and an SVG to the fourth  PL branch of the RCA- (09/13/10) Dr Cyndia Bent. Cath 2017- patent grafts

## 2019-08-01 NOTE — Assessment & Plan Note (Signed)
Eliquis decreased last year secondary to a Hgb of 8.3 and reported rectal bleeding though subsequent stool check negative.

## 2019-08-01 NOTE — Assessment & Plan Note (Signed)
On levemir and HumaLog

## 2019-08-01 NOTE — Patient Instructions (Signed)
Medication Instructions:   INCREASE Apixaban (Eliquis) to 5 mg twice daily.  STOP Amlodipine  START Amiodarone 200 mg twice daily.  Take 1 extra Lasix tablet today.  *If you need a refill on your cardiac medications before your next appointment, please call your pharmacy*   Lab Work: Your physician recommends that you return for lab work today: CMET, CBC, and TSH  If you have labs (blood work) drawn today and your tests are completely normal, you will receive your results only by: Marland Kitchen MyChart Message (if you have MyChart) OR . A paper copy in the mail If you have any lab test that is abnormal or we need to change your treatment, we will call you to review the results.  Follow-Up: At [facility], you and your health needs are our priority.  As part of our continuing mission to provide you with exceptional heart care, we have created designated Provider Care Teams.  These Care Teams include your primary Cardiologist (physician) and Advanced Practice Providers (APPs -  Physician Assistants and Nurse Practitioners) who all work together to provide you with the care you need, when you need it.  We recommend signing up for the patient portal called "MyChart".  Sign up information is provided on this After Visit Summary.  MyChart is used to connect with patients for Virtual Visits (Telemedicine).  Patients are able to view lab/test results, encounter notes, upcoming appointments, etc.  Non-urgent messages can be sent to your provider as well.   To learn more about what you can do with MyChart, go to [email or web address].    Your next appointment:   Tuesday, 08/13/19 at 3:15 PM  The format for your next appointment:   In Person  Provider:   Kerin Ransom, PA-C (Dr. C in office also)

## 2019-08-01 NOTE — Progress Notes (Signed)
Cardiology Office Note:    Date:  08/01/2019   ID:  Jesus Oconnor, DOB December 14, 1933, MRN [id number]  PCP:  Vernie Shanks, MD  Cardiologist:  Sanda Klein, MD  Electrophysiologist:  None   Referring MD: Vernie Shanks, MD   Chief Complaint  Patient presents with  . Follow-up  . Headache  . Shortness of Breath    History of Present Illness:    Jesus Oconnor is a 86 y.o. 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  The patient had been on Eliquis in the past, the dose was reduced after he had anemia and reported rectal bleeding.  Subsequent stool guaiac cards done were negative.  He recently saw Dr. Sallyanne Kuster in April this year with recurrent PAF.  Attempt was made at pacing him out of this but was unsuccessful.  Ultimately was felt that the patient was minimally symptomatic and no further attempts to restore sinus rhythm were attempted.  Patient seen in the office today accompanied by his daughter.  He complains of dyspnea and fatigue.  His EKG shows he is paced at 110, I suspect he has underlying atrial fibrillation.  I obtained recent lab work from [facility].  His creatinine is stable at 1.37.  They did not draw a CBC and I ordered this today as well as a c-Met and TSH.  I reviewed his case with Dr. Percival Spanish in the office.  At this time we feel he may feel better if we attempt to get him back into sinus rhythm.  We've added amiodarone 200 mg twice daily and asked him to increase his Eliquis to 5 mg twice daily.  He'll need to follow-up in a few weeks to consider outpatient cardioversion.  Past Medical History:  Diagnosis Date  . Anemia    history of  . Arthritis   . Atrial flutter (HCC)    severe bradycardia  . CHF (congestive heart failure) ([place])   . Constipation   . COPD (chronic obstructive pulmonary disease) ([place])   . Coronary artery  disease   . Cough   . Diarrhea   . Diastolic dysfunction    Echo 10/09/4625-OJJKK I diastolic dysfunction,EF 93-81%  . DM2 (diabetes mellitus, type 2) ([place])   . Dyslipidemia   . History of kidney stones   . HTN (hypertension)   . Hyperlipidemia   . Leg swelling   . Mobitz type 1 second degree atrioventricular block   . Pacemaker 05/22/2012   Dr  Croitoru    Medtronic Adapta  . 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 SVG to firts & second diagonals,SVG to forth posterolateral branch to RCA  . Sore throat   . Wheezing     Past Surgical History:  Procedure Laterality Date  . CABG x5  09/13/2010   Dr Farrel Conners to LAD,SVG to obtuse marginal,seq. SVG to first & second diagonals,SVG to fourth posterolateral branch RCA  . CARDIAC CATHETERIZATION  09/10/2010   severe LAD,D1,D2 & ostial D2  . CARDIAC CATHETERIZATION N/A 08/28/2015   Procedure: Right/Left Heart Cath and Coronary/Graft Angiography;  Surgeon: Troy Sine, MD;  Location: [facility];  Service: Cardiovascular;  Laterality: N/A;  . CHOLECYSTECTOMY  october 2012  . CORONARY ARTERY BYPASS GRAFT    . INSERT / REPLACE / REMOVE PACEMAKER  05/22/2012    dual chamber  .  PERMANENT PACEMAKER INSERTION N/A 05/22/2012   Procedure: PERMANENT PACEMAKER INSERTION;  Surgeon: Sanda Klein, MD;  Location: [facility];  Service: Cardiovascular;  Laterality: N/A;  . repair of left indirect inguinal hernia with mesh.    . repair of umbilical hernia.    Marland Kitchen TOTAL KNEE ARTHROPLASTY Right 09/26/2016   Procedure: TOTAL KNEE ARTHROPLASTY;  Surgeon: Frederik Pear, MD;  Location: [facility];  Service: Orthopedics;  Laterality: Right;    Current Medications: Current Meds  Medication Sig  . apixaban (ELIQUIS) 5 MG TABS tablet Take 1 tablet (5 mg total) by mouth 2 (two) times daily.  . Cholecalciferol (VITAMIN D3) 125 MCG (5000 UT) TABS Take 5,000 Units by mouth daily.  . Cyanocobalamin (B-12) 1000 MCG/ML KIT Inject 1 Dose as  directed every 30 (thirty) days.   . ferrous sulfate 325 (65 FE) MG tablet Take 325 mg by mouth daily with breakfast.  . fluticasone furoate-vilanterol (BREO ELLIPTA) 200-25 MCG/INH AEPB Inhale 1 puff into the lungs 2 (two) times daily.  . furosemide (LASIX) 40 MG tablet Take 1 tablet (40 mg total) by mouth 2 (two) times daily.  . hydrALAZINE (APRESOLINE) 25 MG tablet Take 1 tablet (25 mg total) by mouth 3 (three) times daily for 30 days.  . metFORMIN (GLUCOPHAGE) 500 MG tablet Take by mouth 2 (two) times daily with a meal.  . metoprolol tartrate (LOPRESSOR) 25 MG tablet TAKE 1 TABLET BY MOUTH  TWICE DAILY  . ONETOUCH VERIO test strip daily. As directed  . potassium chloride SA (KLOR-CON) 20 MEQ tablet Take 1 tablet (20 mEq total) by mouth daily.  . rosuvastatin (CRESTOR) 40 MG tablet Take 40 mg by mouth daily.  . traZODone (DESYREL) 50 MG tablet Take 50 mg by mouth at bedtime as needed for sleep.   Grant Ruts INHUB 250-50 MCG/DOSE AEPB Inhale 1 puff into the lungs 2 (two) times a day.  . [DISCONTINUED] amLODipine (NORVASC) 10 MG tablet TAKE 1 TABLET BY MOUTH  DAILY (Patient taking differently: Take 0.5 mg by mouth in the morning and at bedtime. )  . [DISCONTINUED] apixaban (ELIQUIS) 5 MG TABS tablet Take 2.'5mg'$  (one-half tablet) by mouth twice daily.  . [DISCONTINUED] glimepiride (AMARYL) 1 MG tablet Take 1 mg by mouth daily with breakfast.     Allergies:   No known allergies   Social History   Socioeconomic History  . Marital status: Widowed    Spouse name: Not on file  . Number of children: 2  . Years of education: Not on file  . Highest education level: Not on file  Occupational History  . Not on file  Tobacco Use  . Smoking status: Former Smoker    Years: 40.00    Types: Cigarettes    Quit date: 08/06/2010    Years since quitting: 8.9  . Smokeless tobacco: Never Used  Substance and Sexual Activity  . Alcohol use: Yes    Comment: 5-6 beers per week  . Drug use: Yes    Types:  Marijuana    Comment: couple times a month  . Sexual activity: Not on file  Other Topics Concern  . Not on file  Social History Narrative  . Not on file   Social Determinants of Health   Financial Resource Strain:   . Difficulty of Paying Living Expenses:   Food Insecurity:   . Worried About Charity fundraiser in the Last Year:   . Arboriculturist in the Last Year:   [employer]  Needs:   . Lack of Transportation (Medical):   Marland Kitchen Lack of Transportation (Non-Medical):   Physical Activity:   . Days of Exercise per Week:   . Minutes of Exercise per Session:   Stress:   . Feeling of Stress :   Social Connections:   . Frequency of Communication with Friends and Family:   . Frequency of Social Gatherings with Friends and Family:   . Attends Religious Services:   . Active Member of Clubs or Organizations:   . Attends Archivist Meetings:   Marland Kitchen Marital Status:      Family History: The patient's family history includes Heart disease in his brother, father, and maternal uncle.  ROS:   Please see the history of present illness.     All other systems reviewed and are negative.  EKGs/Labs/Other Studies Reviewed:    The following studies were reviewed today: Echo 03/30/2019- Study Conclusions   - Left ventricle: The cavity size was normal. There was moderate  concentric hypertrophy. Systolic function was mildly to  moderately reduced. The estimated ejection fraction was in the  range of 40% to 45%. Features are consistent with a pseudonormal  left ventricular filling pattern, with concomitant abnormal  relaxation and increased filling pressure (grade 2 diastolic  dysfunction).  - Ventricular septum: Septal motion showed paradox. These changes  are consistent with right ventricular pacing.  - Mitral valve: Calcified annulus.  - Left atrium: The atrium was severely dilated.  - Right ventricle: Systolic function was moderately reduced.  - Right atrium:  The atrium was moderately dilated.  - Pulmonary arteries: Systolic pressure was mildly increased. PA  peak pressure: 35 mm Hg (S).   EKG:  EKG is ordered today.  The ekg ordered today demonstrates paced 110  Recent Labs: 03/07/2019: BNP 316.6 08/01/2019: ALT 8; BUN 22; Creatinine, Ser 1.62; Hemoglobin WILL FOLLOW; Platelets WILL FOLLOW; Potassium 4.0; Sodium 141; TSH 2.250  Recent Lipid Panel    Component Value Date/Time   CHOL 176 09/10/2010 0623   TRIG 94 09/10/2010 0623   HDL 50 09/10/2010 0623   CHOLHDL 3.5 09/10/2010 0623   VLDL 19 09/10/2010 0623   LDLCALC 107 (H) 09/10/2010 0623    Physical Exam:    VS:  BP 114/74 (BP Location: Right Arm, Patient Position: Sitting, Cuff Size: Normal)   Pulse (!) 110   Temp (!) 97.4 F (36.3 C)   Ht '5\' 11"'$  (1.803 m)   Wt 215 lb (97.5 kg)   BMI 29.99 kg/m     Wt Readings from Last 3 Encounters:  08/01/19 215 lb (97.5 kg)  06/17/19 221 lb (100.2 kg)  03/15/19 226 lb 9.6 oz (102.8 kg)     GEN: Elderly male,well developed in no acute distress HEENT: Normal NECK: No JVD; No carotid bruits  CARDIAC: RRR, increased rate,no murmurs, rubs, gallops RESPIRATORY:  Clear to auscultation without rales, wheezing or rhonchi  ABDOMEN: Soft, non-tender, non-distended MUSCULOSKELETAL:  No edema; No deformity  SKIN: Warm and dry NEUROLOGIC:  Alert and oriented x 3 PSYCHIATRIC:  Normal affect   ASSESSMENT:    PAF (paroxysmal atrial fibrillation) (HCC) I suspect the patient is in AF and that is causing his symptoms of fatigue  S/P CABG x 5 CABG x5 using a LIMA graft to LAD, sequential SVG to the first and second diagonal branches of the LAD, an SVG to the OM branch of the left circumflex artery, and an SVG to the fourth  PL branch of the RCA- (  09/13/10) Dr Cyndia Bent. Cath 2017- patent grafts  Insulin dependent type 2 diabetes mellitus ([place]) On levemir and HumaLog  Essential hypertension B/P stable  Anemia Eliquis decreased last year  secondary to a Hgb of 8.3 and reported rectal bleeding though subsequent stool check negative.   Pacemaker MDT 2014  PLAN:    Check labs- CMET, TSH, CBC.  Add Amiodarone 200 mg BID, increase Eliquis to 5 mg BID.  Stop Amlodipine (B/P low).  F/U with me 08/13/2019 to review possible OP DCCV.    Medication Adjustments/Labs and Tests Ordered: Current medicines are reviewed at length with the patient today.  Concerns regarding medicines are outlined above.  Orders Placed This Encounter  Procedures  . Comprehensive metabolic panel  . CBC  . TSH  . EKG 12-Lead   Meds ordered this encounter  Medications  . apixaban (ELIQUIS) 5 MG TABS tablet    Sig: Take 1 tablet (5 mg total) by mouth 2 (two) times daily.    Dispense:  180 tablet    Refill:  1  . amiodarone (PACERONE) 200 MG tablet    Sig: Take 1 tablet (200 mg total) by mouth 2 (two) times daily.    Dispense:  180 tablet    Refill:  1    Patient Instructions  Medication Instructions:   INCREASE Apixaban (Eliquis) to 5 mg twice daily.  STOP Amlodipine  START Amiodarone 200 mg twice daily.  Take 1 extra Lasix tablet today.  *If you need a refill on your cardiac medications before your next appointment, please call your pharmacy*   Lab Work: Your physician recommends that you return for lab work today: CMET, CBC, and TSH  If you have labs (blood work) drawn today and your tests are completely normal, you will receive your results only by: Marland Kitchen MyChart Message (if you have MyChart) OR . A paper copy in the mail If you have any lab test that is abnormal or we need to change your treatment, we will call you to review the results.  Follow-Up: At [facility], you and your health needs are our priority.  As part of our continuing mission to provide you with exceptional heart care, we have created designated Provider Care Teams.  These Care Teams include your primary Cardiologist (physician) and Advanced Practice Providers (APPs  -  Physician Assistants and Nurse Practitioners) who all work together to provide you with the care you need, when you need it.  We recommend signing up for the patient portal called "MyChart".  Sign up information is provided on this After Visit Summary.  MyChart is used to connect with patients for Virtual Visits (Telemedicine).  Patients are able to view lab/test results, encounter notes, upcoming appointments, etc.  Non-urgent messages can be sent to your provider as well.   To learn more about what you can do with MyChart, go to [email or web address].    Your next appointment:   Tuesday, 08/13/19 at 3:15 PM  The format for your next appointment:   In Person  Provider:   Kerin Ransom, PA-C (Dr. Loletha Grayer in office also)       Signed, Kerin Ransom, PA-C  08/01/2019 4:38 PM    [place]

## 2019-08-01 NOTE — Telephone Encounter (Signed)
Patient has OV June 8 with [facility]

## 2019-08-10 NOTE — Progress Notes (Signed)
Thanks, [employer]. I'll be in the office on 6/8 when you see him.

## 2019-08-13 ENCOUNTER — Other Ambulatory Visit: Payer: Self-pay

## 2019-08-13 ENCOUNTER — Encounter: Payer: Self-pay | Admitting: Cardiology

## 2019-08-13 ENCOUNTER — Telehealth: Payer: Self-pay | Admitting: Cardiology

## 2019-08-13 ENCOUNTER — Ambulatory Visit: Payer: Medicare Other | Admitting: Cardiology

## 2019-08-13 VITALS — BP 128/70 | HR 88 | Ht 71.0 in | Wt 212.8 lb

## 2019-08-13 DIAGNOSIS — I480 Paroxysmal atrial fibrillation: Secondary | ICD-10-CM

## 2019-08-13 DIAGNOSIS — Z951 Presence of aortocoronary bypass graft: Secondary | ICD-10-CM

## 2019-08-13 DIAGNOSIS — I10 Essential (primary) hypertension: Secondary | ICD-10-CM

## 2019-08-13 DIAGNOSIS — E119 Type 2 diabetes mellitus without complications: Secondary | ICD-10-CM | POA: Diagnosis not present

## 2019-08-13 DIAGNOSIS — Z794 Long term (current) use of insulin: Secondary | ICD-10-CM

## 2019-08-13 DIAGNOSIS — Z7901 Long term (current) use of anticoagulants: Secondary | ICD-10-CM

## 2019-08-13 DIAGNOSIS — I255 Ischemic cardiomyopathy: Secondary | ICD-10-CM

## 2019-08-13 DIAGNOSIS — Z950 Presence of cardiac pacemaker: Secondary | ICD-10-CM

## 2019-08-13 MED ORDER — AMIODARONE HCL 200 MG PO TABS
200.0000 mg | ORAL_TABLET | Freq: Every day | ORAL | 1 refills | Status: DC
Start: 2019-08-13 — End: 2019-11-13

## 2019-08-13 NOTE — Patient Instructions (Signed)
Medication Instructions:   DECREASE Amiodarone to 200 mg daily.  *If you need a refill on your cardiac medications before your next appointment, please call your pharmacy*    Follow-Up: At [facility], you and your health needs are our priority.  As part of our continuing mission to provide you with exceptional heart care, we have created designated Provider Care Teams.  These Care Teams include your primary Cardiologist (physician) and Advanced Practice Providers (APPs -  Physician Assistants and Nurse Practitioners) who all work together to provide you with the care you need, when you need it.  We recommend signing up for the patient portal called "MyChart".  Sign up information is provided on this After Visit Summary.  MyChart is used to connect with patients for Virtual Visits (Telemedicine).  Patients are able to view lab/test results, encounter notes, upcoming appointments, etc.  Non-urgent messages can be sent to your provider as well.   To learn more about what you can do with MyChart, go to [email or web address].    Your next appointment:   3 month(s)  The format for your next appointment:   In Person  Provider:   Sanda Klein, MD

## 2019-08-13 NOTE — Telephone Encounter (Signed)
Called and spoke with pt's daughter per DPR, she states that Abigail Butts is a caregiver that stays with her father and she will be the one driving him to his appt today. Notified it was fine for her to attend. Daughter had no other questions at this time.

## 2019-08-13 NOTE — Telephone Encounter (Signed)
[name] is calling requesting Jesus Oconnor attend [place] upcoming appointment today at 3:15 PM due to her driving him as well as needing to keep up with all the information given at the appt. Please advise.

## 2019-08-13 NOTE — Progress Notes (Addendum)
Cardiology Office Note:    Date:  08/13/2019   ID:  Jesus Oconnor, DOB March 16, 1933, MRN [id number]  PCP:  Vernie Shanks, MD  Cardiologist:  Sanda Klein, MD  Electrophysiologist:  None   Referring MD: Vernie Shanks, MD   No chief complaint on file.   History of Present Illness:    Jesus Oconnor is a 86 y.o. 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  The patient had been on Eliquis in the past, the dose was reduced after he had anemia and reported rectal bleeding.  Subsequent stool guaiac cards done were negative.  He saw Dr. Sallyanne Kuster in April this year with recurrent PAF.  Attempt was made at pacing him out of this but was unsuccessful.  Ultimately was felt that the patient was minimally symptomatic and no further attempts to restore sinus rhythm were attempted.  The patient seen in the office 08/01/2019 accompanied by his daughter.  He complained of dyspnea and fatigue.  His EKG showed he was paced at 110, I suspected he was in atrial fibrillation. After review with Dr Percival Spanish we suggested Jesus Oconnor increase his Eliquis to 5 mg BID and we added Amiodarone 200 mg BID.  He returns today for follow up.  He feels better "goog days and bad days".  He's pretty stoic but overall I get the sense he feels better.  His EKG shows atrial sensing-V pacing at 88.  Also of note he has lost 22 lbs since Dec 2020 and says his chronic LE edema is much improved.   Past Medical History:  Diagnosis Date  . Anemia    history of  . Arthritis   . Atrial flutter (HCC)    severe bradycardia  . CHF (congestive heart failure) ([place])   . Constipation   . COPD (chronic obstructive pulmonary disease) ([place])   . Coronary artery disease   . Cough   . Diarrhea   . Diastolic dysfunction    Echo 10/05/8561-JSHFW I diastolic dysfunction,EF 26-37%  . DM2 (diabetes mellitus, type 2)  ([place])   . Dyslipidemia   . History of kidney stones   . HTN (hypertension)   . Hyperlipidemia   . Leg swelling   . Mobitz type 1 second degree atrioventricular block   . Pacemaker 05/22/2012   Dr  Croitoru    Medtronic Adapta  . 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 SVG to firts & second diagonals,SVG to forth posterolateral branch to RCA  . Sore throat   . Wheezing     Past Surgical History:  Procedure Laterality Date  . CABG x5  09/13/2010   Dr Farrel Conners to LAD,SVG to obtuse marginal,seq. SVG to first & second diagonals,SVG to fourth posterolateral branch RCA  . CARDIAC CATHETERIZATION  09/10/2010   severe LAD,D1,D2 & ostial D2  . CARDIAC CATHETERIZATION N/A 08/28/2015   Procedure: Right/Left Heart Cath and Coronary/Graft Angiography;  Surgeon: Troy Sine, MD;  Location: [facility];  Service: Cardiovascular;  Laterality: N/A;  . CHOLECYSTECTOMY  october 2012  . CORONARY ARTERY BYPASS GRAFT    . INSERT / REPLACE / REMOVE PACEMAKER  05/22/2012    dual chamber  . PERMANENT PACEMAKER INSERTION N/A 05/22/2012   Procedure: PERMANENT PACEMAKER INSERTION;  Surgeon: Sanda Klein, MD;  Location: [facility];  Service: Cardiovascular;  Laterality: N/A;  .  repair of left indirect inguinal hernia with mesh.    . repair of umbilical hernia.    Marland Kitchen TOTAL KNEE ARTHROPLASTY Right 09/26/2016   Procedure: TOTAL KNEE ARTHROPLASTY;  Surgeon: Frederik Pear, MD;  Location: [facility];  Service: Orthopedics;  Laterality: Right;    Current Medications: No outpatient medications have been marked as taking for the 08/13/19 encounter (Office Visit) with Erlene Quan, PA-C.     Allergies:   No known allergies   Social History   Socioeconomic History  . Marital status: Widowed    Spouse name: Not on file  . Number of children: 2  . Years of education: Not on file  . Highest education level: Not on file  Occupational History  . Not on file  Tobacco Use  . Smoking  status: Former Smoker    Years: 40.00    Types: Cigarettes    Quit date: 08/06/2010    Years since quitting: 9.0  . Smokeless tobacco: Never Used  Substance and Sexual Activity  . Alcohol use: Yes    Comment: 5-6 beers per week  . Drug use: Yes    Types: Marijuana    Comment: couple times a month  . Sexual activity: Not on file  Other Topics Concern  . Not on file  Social History Narrative  . Not on file   Social Determinants of Health   Financial Resource Strain:   . Difficulty of Paying Living Expenses:   Food Insecurity:   . Worried About Charity fundraiser in the Last Year:   . Arboriculturist in the Last Year:   Transportation Needs:   . Film/video editor (Medical):   Marland Kitchen Lack of Transportation (Non-Medical):   Physical Activity:   . Days of Exercise per Week:   . Minutes of Exercise per Session:   Stress:   . Feeling of Stress :   Social Connections:   . Frequency of Communication with Friends and Family:   . Frequency of Social Gatherings with Friends and Family:   . Attends Religious Services:   . Active Member of Clubs or Organizations:   . Attends Archivist Meetings:   Marland Kitchen Marital Status:      Family History: The patient's family history includes Heart disease in his brother, father, and maternal uncle.  ROS:   Please see the history of present illness.     All other systems reviewed and are negative.  EKGs/Labs/Other Studies Reviewed:    The following studies were reviewed today: Echo 03/29/2018- Study Conclusions   - Left ventricle: The cavity size was normal. There was moderate  concentric hypertrophy. Systolic function was mildly to  moderately reduced. The estimated ejection fraction was in the  range of 40% to 45%. Features are consistent with a pseudonormal  left ventricular filling pattern, with concomitant abnormal  relaxation and increased filling pressure (grade 2 diastolic  dysfunction).  - Ventricular septum:  Septal motion showed paradox. These changes  are consistent with right ventricular pacing.  - Mitral valve: Calcified annulus.  - Left atrium: The atrium was severely dilated.  - Right ventricle: Systolic function was moderately reduced.  - Right atrium: The atrium was moderately dilated.  - Pulmonary arteries: Systolic pressure was mildly increased. PA  peak pressure: 35 mm Hg (S).  EKG:  EKG is ordered today.  The ekg ordered today demonstrates A sensing, V pacing  Recent Labs: 03/07/2019: BNP 316.6 08/01/2019: ALT 8; BUN 22; Creatinine, Ser 1.62;  Hemoglobin 11.8; Platelets 188; Potassium 4.0; Sodium 141; TSH 2.250  Recent Lipid Panel    Component Value Date/Time   CHOL 176 09/10/2010 0623   TRIG 94 09/10/2010 0623   HDL 50 09/10/2010 0623   CHOLHDL 3.5 09/10/2010 0623   VLDL 19 09/10/2010 0623   LDLCALC 107 (H) 09/10/2010 0623    Physical Exam:    VS:  BP 128/70   Pulse 88   Ht 5\' 11"  (1.803 m)   Wt 212 lb 12.8 oz (96.5 kg)   SpO2 98%   BMI 29.68 kg/m     Wt Readings from Last 3 Encounters:  08/13/19 212 lb 12.8 oz (96.5 kg)  08/01/19 215 lb (97.5 kg)  06/17/19 221 lb (100.2 kg)     GEN: Well nourished, well developed in no acute distress HEENT: Normal NECK: No JVD; No carotid bruits LYMPHATICS: No lymphadenopathy CARDIAC: RRR, no murmurs, rubs, gallops RESPIRATORY:  Few scattered crackles ABDOMEN: Soft, non-tender, non-distended MUSCULOSKELETAL:  Trace if any edema; No deformity  SKIN: Warm and dry NEUROLOGIC:  Alert and oriented x 3 PSYCHIATRIC:  Normal affect   ASSESSMENT:    PAF (paroxysmal atrial fibrillation) (HCC) The patient appears to have converted on Amiodarone- consider decreasing to 200 mg daily.  Chronic anticoagulation- Continue Eliquis at 5 mg BID  S/P CABG x 5 CABG x5 using a LIMA graft to LAD, sequential SVG to the first and second diagonal branches of the LAD, an SVG to the OM branch of the left circumflex artery, and an SVG to  the fourth  PL branch of the RCA- (09/13/10) Dr Cyndia Bent. Cath 2017- patent grafts  Insulin dependent type 2 diabetes mellitus ([place]) On levemir and HumaLog  Essential hypertension B/P stable off Amlodipine and now in NSR.   Anemia Eliquis decreased last year secondary to a Hgb of 8.3 and reported rectal bleeding though subsequent stool check negative.   Pacemaker MDT 2014  PLAN:    Decrease Amiodarone to 200 mg daily- continue Eliquis 5 mg BID.  F/U Dr Sallyanne Kuster in 3 months.    Medication Adjustments/Labs and Tests Ordered: Current medicines are reviewed at length with the patient today.  Concerns regarding medicines are outlined above.  No orders of the defined types were placed in this encounter.  No orders of the defined types were placed in this encounter.   There are no Patient Instructions on file for this visit.   Signed, Kerin Ransom, PA-C  08/13/2019 3:32 PM    [facility]

## 2019-08-23 ENCOUNTER — Ambulatory Visit: Payer: Medicare Other

## 2019-08-26 ENCOUNTER — Telehealth: Payer: Self-pay | Admitting: Cardiology

## 2019-08-26 NOTE — Telephone Encounter (Signed)
New message   Patient's granddaughter is calling to see if any transmissions have been missed. Please advise.

## 2019-08-26 NOTE — Telephone Encounter (Signed)
I let the pt granddaughter know we did not get his last transmission. She agreed to do one today.

## 2019-08-27 LAB — CUP PACEART REMOTE DEVICE CHECK
Battery Impedance: 1036 Ohm
Battery Remaining Longevity: 42 mo
Battery Voltage: 2.78 V
Brady Statistic AP VP Percent: 0 %
Brady Statistic AP VS Percent: 0 %
Brady Statistic AS VP Percent: 100 %
Brady Statistic AS VS Percent: 0 %
Date Time Interrogation Session: 20210621165103
Implantable Lead Implant Date: 20140318
Implantable Lead Implant Date: 20140318
Implantable Lead Location: 753859
Implantable Lead Location: 753860
Implantable Lead Model: 5076
Implantable Lead Model: 5076
Implantable Pulse Generator Implant Date: 20140318
Lead Channel Impedance Value: 492 Ohm
Lead Channel Impedance Value: 652 Ohm
Lead Channel Pacing Threshold Amplitude: 0.875 V
Lead Channel Pacing Threshold Pulse Width: 0.4 ms
Lead Channel Setting Pacing Amplitude: 2.5 V
Lead Channel Setting Pacing Amplitude: 5 V
Lead Channel Setting Pacing Pulse Width: 0.4 ms
Lead Channel Setting Sensing Sensitivity: 2.8 mV

## 2019-09-16 ENCOUNTER — Telehealth: Payer: Self-pay | Admitting: Emergency Medicine

## 2019-09-16 NOTE — Telephone Encounter (Signed)
Spoke with patient and his daughter Jesus Oconnor(DPR) and patient scheduled for device clinic appointment to program his device VVIR per Dr Sallyanne Kuster due to persistant.  AF/AFL. Patient will come in 09/19/19 at 1630 with Jesus Oconnor.

## 2019-09-16 NOTE — Telephone Encounter (Signed)
This encounter was created in error - please disregard.

## 2019-09-19 ENCOUNTER — Ambulatory Visit (INDEPENDENT_AMBULATORY_CARE_PROVIDER_SITE_OTHER): Payer: Medicare Other | Admitting: Emergency Medicine

## 2019-09-19 ENCOUNTER — Other Ambulatory Visit: Payer: Self-pay

## 2019-09-19 DIAGNOSIS — I255 Ischemic cardiomyopathy: Secondary | ICD-10-CM

## 2019-09-19 LAB — CUP PACEART INCLINIC DEVICE CHECK
Battery Impedance: 1089 Ohm
Battery Remaining Longevity: 39 mo
Battery Voltage: 2.74 V
Brady Statistic RV Percent Paced: 100 %
Date Time Interrogation Session: 20210715160100
Implantable Lead Implant Date: 20140318
Implantable Lead Implant Date: 20140318
Implantable Lead Location: 753859
Implantable Lead Location: 753860
Implantable Lead Model: 5076
Implantable Lead Model: 5076
Implantable Pulse Generator Implant Date: 20140318
Lead Channel Impedance Value: 478 Ohm
Lead Channel Impedance Value: 591 Ohm
Lead Channel Pacing Threshold Amplitude: 0.875 V
Lead Channel Pacing Threshold Pulse Width: 0.4 ms
Lead Channel Setting Pacing Amplitude: 2.5 V
Lead Channel Setting Pacing Pulse Width: 0.46 ms
Lead Channel Setting Sensing Sensitivity: 2.8 mV

## 2019-09-19 NOTE — Progress Notes (Signed)
Patients mode reprogrammed to VVIR.

## 2019-10-21 ENCOUNTER — Other Ambulatory Visit: Payer: Self-pay | Admitting: Internal Medicine

## 2019-10-21 DIAGNOSIS — I480 Paroxysmal atrial fibrillation: Secondary | ICD-10-CM

## 2019-10-21 NOTE — Telephone Encounter (Signed)
57m 96.5kg Scr 1.62 08/01/19 Pt requesting 5mg  eliquis when they only qualify for the 2.5mg  will route to the pharmd pool for further review

## 2019-10-22 ENCOUNTER — Telehealth: Payer: Self-pay

## 2019-10-22 NOTE — Telephone Encounter (Signed)
lmom pt for bmet, orders placed

## 2019-10-22 NOTE — Telephone Encounter (Signed)
Scr variable between 1.4 and 1.6  Will okay refill for eliquis 5mg  today, and order f/u BMET to complete prior to next refill authorization.

## 2019-10-24 ENCOUNTER — Other Ambulatory Visit: Payer: Self-pay

## 2019-10-24 DIAGNOSIS — I480 Paroxysmal atrial fibrillation: Secondary | ICD-10-CM

## 2019-10-24 LAB — BASIC METABOLIC PANEL
BUN/Creatinine Ratio: 11 (ref 10–24)
BUN: 16 mg/dL (ref 8–27)
CO2: 27 mmol/L (ref 20–29)
Calcium: 9.1 mg/dL (ref 8.6–10.2)
Chloride: 101 mmol/L (ref 96–106)
Creatinine, Ser: 1.45 mg/dL — ABNORMAL HIGH (ref 0.76–1.27)
GFR calc Af Amer: 51 mL/min/{1.73_m2} — ABNORMAL LOW (ref >59)
GFR calc non Af Amer: 44 mL/min/{1.73_m2} — ABNORMAL LOW (ref >59)
Glucose: 168 mg/dL — ABNORMAL HIGH (ref 65–99)
Potassium: 4.7 mmol/L (ref 3.5–5.2)
Sodium: 141 mmol/L (ref 134–144)

## 2019-10-27 ENCOUNTER — Other Ambulatory Visit: Payer: Self-pay | Admitting: Internal Medicine

## 2019-10-30 ENCOUNTER — Other Ambulatory Visit: Payer: Self-pay | Admitting: Internal Medicine

## 2019-11-06 ENCOUNTER — Telehealth: Payer: Self-pay | Admitting: Family

## 2019-11-06 NOTE — Telephone Encounter (Signed)
Called to Discuss with patient about Covid symptoms and the use of the monoclonal antibody infusion for those with mild to moderate Covid symptoms and at a high risk of hospitalization.     Pt appears to qualify for this infusion due to co-morbid conditions and/or a member of an at-risk group in accordance with the FDA Emergency Use Authorization. Risk factors include COPD, CAD, Hypertension, Age > 17 and BMI >25.   Message left on daughter [name]'s cell number with hotline number.   Terri Piedra, NP 11/06/2019 3:36 PM

## 2019-11-07 ENCOUNTER — Other Ambulatory Visit: Payer: Self-pay | Admitting: Adult Health

## 2019-11-07 NOTE — Progress Notes (Signed)
I connected by phone with Jesus Oconnor on 11/07/2019 at 7:40 PM to discuss the potential use of a new treatment for mild to moderate COVID-19 viral infection in non-hospitalized patients.  This patient is a 86 y.o. male that meets the FDA criteria for Emergency Use Authorization of COVID monoclonal antibody casirivimab/imdevimab.  Has a (+) direct SARS-CoV-2 viral test result  Has mild or moderate COVID-19   Is NOT hospitalized due to COVID-19  Is within 10 days of symptom onset  Has at least one of the high risk factor(s) for progression to severe COVID-19 and/or hospitalization as defined in EUA.  Specific high risk criteria : Older age (>/= 84 yo)   I have spoken and communicated the following to the patient or parent/caregiver regarding COVID monoclonal antibody treatment:  1. FDA has authorized the emergency use for the treatment of mild to moderate COVID-19 in adults and pediatric patients with positive results of direct SARS-CoV-2 viral testing who are 50 years of age and older weighing at least 40 kg, and who are at high risk for progressing to severe COVID-19 and/or hospitalization.  2. The significant known and potential risks and benefits of COVID monoclonal antibody, and the extent to which such potential risks and benefits are unknown.  3. Information on available alternative treatments and the risks and benefits of those alternatives, including clinical trials.  4. Patients treated with COVID monoclonal antibody should continue to self-isolate and use infection control measures (e.g., wear mask, isolate, social distance, avoid sharing personal items, clean and disinfect "high touch" surfaces, and frequent handwashing) according to CDC guidelines.   5. The patient or parent/caregiver has the option to accept or refuse COVID monoclonal antibody treatment.  After reviewing this information with the patient, The patient agreed to proceed with receiving casirivimab\imdevimab  infusion and will be provided a copy of the Fact sheet prior to receiving the infusion.  Set up for 11/08/19 at 1700. Will bring copy of positive home test.   Sebrena Engh 11/07/2019 7:40 PM

## 2019-11-08 ENCOUNTER — Ambulatory Visit (HOSPITAL_COMMUNITY)
Admission: RE | Admit: 2019-11-08 | Discharge: 2019-11-08 | Disposition: A | Discharge status: Home / Self Care | Payer: Medicare Other | Source: Ambulatory Visit | Attending: Pulmonary Disease | Admitting: Pulmonary Disease

## 2019-11-08 DIAGNOSIS — U071 COVID-19: Secondary | ICD-10-CM | POA: Diagnosis not present

## 2019-11-08 DIAGNOSIS — Z23 Encounter for immunization: Secondary | ICD-10-CM | POA: Insufficient documentation

## 2019-11-08 MED ORDER — EPINEPHRINE 0.3 MG/0.3ML IJ SOAJ: 0.3000 mg | Freq: Once | INTRAMUSCULAR | Status: DC | PRN

## 2019-11-08 MED ORDER — DIPHENHYDRAMINE HCL 50 MG/ML IJ SOLN: 50.0000 mg | Freq: Once | INTRAMUSCULAR | Status: DC | PRN

## 2019-11-08 MED ORDER — ALBUTEROL SULFATE HFA 108 (90 BASE) MCG/ACT IN AERS: 2.0000 | INHALATION_SPRAY | Freq: Once | RESPIRATORY_TRACT | Status: DC | PRN

## 2019-11-08 MED ORDER — FAMOTIDINE IN NACL 20-0.9 MG/50ML-% IV SOLN: 20.0000 mg | Freq: Once | INTRAVENOUS | Status: DC | PRN

## 2019-11-08 MED ORDER — SODIUM CHLORIDE 0.9 % IV SOLN
1200.0000 mg | Freq: Once | INTRAVENOUS | Status: AC
  Administered 2019-11-08: 1200 mg via INTRAVENOUS
  Filled 2019-11-08: qty 10

## 2019-11-08 MED ORDER — METHYLPREDNISOLONE SODIUM SUCC 125 MG IJ SOLR: 125.0000 mg | Freq: Once | INTRAMUSCULAR | Status: DC | PRN

## 2019-11-08 MED ORDER — SODIUM CHLORIDE 0.9 % IV SOLN: INTRAVENOUS | Status: DC | PRN

## 2019-11-08 NOTE — Discharge Instructions (Signed)

## 2019-11-08 NOTE — Progress Notes (Signed)
  Diagnosis: COVID-19  Physician:DR P. Joya Gaskins  Procedure: [facility] Infusion [facility] Med: casirivimab\imdevimab infusion - Provided patient with casirivimab\imdevimab fact sheet for patients, parents and caregivers prior to infusion.  Complications: No immediate complications noted.  Discharge: Discharged home   Scotty Court 11/08/2019

## 2019-11-12 NOTE — Progress Notes (Signed)
Patient ID: Jesus Oconnor, male   DOB: September 20, 1933, 86 y.o.   MRN: [id number]     Cardiology Office Note    Date:  11/12/2019   ID:  Jesus Oconnor, DOB 05-18-1933, MRN [id number]  PCP:  Ileana Ladd, MD  Cardiologist:   Thurmon Fair, MD   Chief Complaint  Patient presents with  . Follow-up    CHF, CAD, AV block, pacemaker     History of Present Illness:  Jesus Oconnor is a 86 y.o. 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  It appears that he has been in persistent atrial fibrillation for roughly the last 8 months (since March 23, 2019, episodes of arrhythmia interruption all appear to be due to undersensing of the atrial flutter waves).  His device is now programmed VVIR.  Arrhythmia itself appears to be asymptomatic, but problems with intermittent atrial under sensing and tracking of the atrial rhythm did cause problems.  He is fully anticoagulated on Eliquis.  On August 13, 2019 he was seen in the clinic and had ventricular pacing at 110 bpm.  His amiodarone was increased.  He returned for follow-up on June 8 and his ventricular pacing was down to 88 bpm.  There was intermittent tracking of atrial flutter.  On July 15 his device was reprogrammed VVIR. At first glance on device interrogation today the rhythm appears to be the atrial sensed, ventricular paced at 90 bpm, but with closer attention, he actually has slow atrial flutter at 180 bpm with each alternative flutter wave following in refractory period.  Edema is well controlled. His diuretic requirements have not increased. He really does not have exertional dyspnea and left knee pain limits him more than shortness of breath does. He  does not have angina, palpitations, syncope or dizziness or focal neurological events. He has not had any falls, serious injuries or bleeding problems, but does have easy bruising on his forearms.   Despite receiving full vaccinations in the spring, he had breakthrough COVID-19 infection several weeks ago. He received monoclonal antibodies and did quite well. He did have some wheezing. Treatment with steroids led to marked hyperglycemia and these were stopped.  Pacemaker interrogation shows normal function. When briefly reprogrammed to DDD there is evidence of ongoing atrial flutter with a cycle length of 333 ms, alternate flutter waves following in the PVARP and 100% ventricular paced rhythm.  Estimated generator longevity is 5 years.  Ventricular lead function is normal with stable impedance and pacing capture threshold. He does have an escape rhythm today at roughly 40 bpm, but this has not been consistently present.  Prior to the onset of atrial flutter he did not have any detectable atrial activity and he did not have an idioventricular escape rhythm.  He is pacemaker dependent.  He had right total knee replacement for severe arthritis.  This is also the site of his saphenous vein harvest for his previous bypass. The right calf often swells a little more than the left.    In 2017, he developed edema and shortness of breath and echocardiography showed an EF had dropped to 30-35 %. On 08/28/2015 he underwent right and left heart catheterization. Estimated LVEF was better at 45-50 %. He had severe native coronary disease, but all his bypasses (LIMA to LAD,  SVG sequential diagonal 1-diagonal 2, SVG to distal circumflex, SVG to PLA branch of RCA) were widely patent. Medical therapy was recommended. Past Medical History:  Diagnosis Date  . Anemia    history of  . Arthritis   . Atrial flutter (HCC)    severe bradycardia  . CHF (congestive heart failure) ([place])   . Constipation   . COPD (chronic  obstructive pulmonary disease) ([place])   . Coronary artery disease   . Cough   . Diarrhea   . Diastolic dysfunction    Echo 03/12/1094-EAVWU I diastolic dysfunction,EF 98-11%  . DM2 (diabetes mellitus, type 2) ([place])   . Dyslipidemia   . History of kidney stones   . HTN (hypertension)   . Hyperlipidemia   . Leg swelling   . Mobitz type 1 second degree atrioventricular block   . Pacemaker 05/22/2012   Dr  Bayyinah Dukeman    Medtronic Adapta  . 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 SVG to firts & second diagonals,SVG to forth posterolateral branch to RCA  . Sore throat   . Wheezing     Past Surgical History:  Procedure Laterality Date  . CABG x5  09/13/2010   Dr Farrel Conners to LAD,SVG to obtuse marginal,seq. SVG to first & second diagonals,SVG to fourth posterolateral branch RCA  . CARDIAC CATHETERIZATION  09/10/2010   severe LAD,D1,D2 & ostial D2  . CARDIAC CATHETERIZATION N/A 08/28/2015   Procedure: Right/Left Heart Cath and Coronary/Graft Angiography;  Surgeon: Troy Sine, MD;  Location: [facility];  Service: Cardiovascular;  Laterality: N/A;  . CHOLECYSTECTOMY  october 2012  . CORONARY ARTERY BYPASS GRAFT    . INSERT / REPLACE / REMOVE PACEMAKER  05/22/2012    dual chamber  . PERMANENT PACEMAKER INSERTION N/A 05/22/2012   Procedure: PERMANENT PACEMAKER INSERTION;  Surgeon: Sanda Klein, MD;  Location: [facility];  Service: Cardiovascular;  Laterality: N/A;  . repair of left indirect inguinal hernia with mesh.    . repair of umbilical hernia.    Marland Kitchen TOTAL KNEE ARTHROPLASTY Right 09/26/2016   Procedure: TOTAL KNEE ARTHROPLASTY;  Surgeon: Frederik Pear, MD;  Location: [facility];  Service: Orthopedics;  Laterality: Right;    Current Medications: Outpatient Medications Prior to Visit  Medication Sig Dispense Refill  . amiodarone (PACERONE) 200 MG tablet Take 1 tablet (200 mg total) by mouth daily. 90 tablet 1  . Cholecalciferol (VITAMIN D3) 125 MCG (5000 UT)  TABS Take 5,000 Units by mouth daily.    . Cyanocobalamin (B-12) 1000 MCG/ML KIT Inject 1 Dose as directed every 30 (thirty) days.     Marland Kitchen ELIQUIS 5 MG TABS tablet TAKE 1/2 TABLET BY MOUTH TWICE DAILY 180 tablet 0  . ferrous sulfate 325 (65 FE) MG tablet Take 325 mg by mouth daily with breakfast.    . fluticasone furoate-vilanterol (BREO ELLIPTA) 200-25 MCG/INH AEPB Inhale 1 puff into the lungs 2 (two) times daily.    . furosemide (LASIX) 40 MG tablet Take 1 tablet (40 mg total) by mouth 2 (two) times daily. 180 tablet 3  . hydrALAZINE (APRESOLINE) 25 MG tablet Take 1 tablet (25 mg total) by mouth 3 (three) times daily for 30 days. 90 tablet 0  . metFORMIN (GLUCOPHAGE) 500 MG tablet Take by mouth 2 (two) times daily with a meal.    . metoprolol tartrate (LOPRESSOR) 25 MG tablet TAKE 1 TABLET BY MOUTH  TWICE DAILY 180 tablet 2  . ONETOUCH VERIO test  strip daily. As directed  4  . potassium chloride SA (KLOR-CON) 20 MEQ tablet Take 1 tablet (20 mEq total) by mouth daily. 90 tablet 3  . rosuvastatin (CRESTOR) 40 MG tablet Take 40 mg by mouth daily.    . traZODone (DESYREL) 50 MG tablet Take 50 mg by mouth at bedtime as needed for sleep.     Grant Ruts INHUB 250-50 MCG/DOSE AEPB Inhale 1 puff into the lungs 2 (two) times a day.     No facility-administered medications prior to visit.     Allergies:   No known allergies   Social History   Socioeconomic History  . Marital status: Widowed    Spouse name: Not on file  . Number of children: 2  . Years of education: Not on file  . Highest education level: Not on file  Occupational History  . Not on file  Tobacco Use  . Smoking status: Former Smoker    Years: 40.00    Types: Cigarettes    Quit date: 08/06/2010    Years since quitting: 9.2  . Smokeless tobacco: Never Used  Substance and Sexual Activity  . Alcohol use: Yes    Comment: 5-6 beers per week  . Drug use: Yes    Types: Marijuana    Comment: couple times a month  . Sexual activity:  Not on file  Other Topics Concern  . Not on file  Social History Narrative  . Not on file   Social Determinants of Health   Financial Resource Strain:   . Difficulty of Paying Living Expenses: Not on file  Food Insecurity:   . Worried About Charity fundraiser in the Last Year: Not on file  . Ran Out of Food in the Last Year: Not on file  Transportation Needs:   . Lack of Transportation (Medical): Not on file  . Lack of Transportation (Non-Medical): Not on file  Physical Activity:   . Days of Exercise per Week: Not on file  . Minutes of Exercise per Session: Not on file  Stress:   . Feeling of Stress : Not on file  Social Connections:   . Frequency of Communication with Friends and Family: Not on file  . Frequency of Social Gatherings with Friends and Family: Not on file  . Attends Religious Services: Not on file  . Active Member of Clubs or Organizations: Not on file  . Attends Archivist Meetings: Not on file  . Marital Status: Not on file     Family History:  The patient's family history includes Heart disease in his brother, father, and maternal uncle.   ROS:   Please see the history of present illness.    ROS All other systems are reviewed and are negative.   PHYSICAL EXAM:   VS:  There were no vitals taken for this visit.      General: Alert, oriented x3, no distress, healthy left subclavian pacemaker site Head: no evidence of trauma, PERRL, EOMI, no exophtalmos or lid lag, no myxedema, no xanthelasma; normal ears, nose and oropharynx Neck: normal jugular venous pulsations and no hepatojugular reflux; brisk carotid pulses without delay and no carotid bruits Chest: clear to auscultation, no signs of consolidation by percussion or palpation, normal fremitus, symmetrical and full respiratory excursions Cardiovascular: normal position and quality of the apical impulse, regular rhythm, normal first and paradoxically split second heart sounds, no murmurs, rubs  or gallops Abdomen: no tenderness or distention, no masses by palpation, no abnormal  pulsatility or arterial bruits, normal bowel sounds, no hepatosplenomegaly Extremities: no clubbing, cyanosis or edema; 2+ radial, ulnar and brachial pulses bilaterally; 2+ right femoral, posterior tibial and dorsalis pedis pulses; 2+ left femoral, posterior tibial and dorsalis pedis pulses; no subclavian or femoral bruits Neurological: grossly nonfocal Psych: Normal mood and affect    Wt Readings from Last 3 Encounters:  08/13/19 212 lb 12.8 oz (96.5 kg)  08/01/19 215 lb (97.5 kg)  06/17/19 221 lb (100.2 kg)   Studies/Labs Reviewed:   EKG:  EKG is  ordered today.  The ekg ordered today demonstrates 100% V pacing at 90 bpm. Pacemaker interrogation shows that there is 2: 1 tracking of slow atrial flutter at 180 bpm.   ECHO: 03/29/2018 - Left ventricle: The cavity size was normal. There was moderate  concentric hypertrophy. Systolic function was mildly to  moderately reduced. The estimated ejection fraction was in the  range of 40% to 45%. Features are consistent with a pseudonormal  left ventricular filling pattern, with concomitant abnormal  relaxation and increased filling pressure (grade 2 diastolic  dysfunction).  - Ventricular septum: Septal motion showed paradox. These changes  are consistent with right ventricular pacing.  - Mitral valve: Calcified annulus.  - Left atrium: The atrium was severely dilated.  - Right ventricle: Systolic function was moderately reduced.  - Right atrium: The atrium was moderately dilated.  - Pulmonary arteries: Systolic pressure was mildly increased. PA  peak pressure: 35 mm Hg (S).  Recent Labs: 03/07/2019: BNP 316.6 08/01/2019: ALT 8; Hemoglobin 11.8; Platelets 188; TSH 2.250 10/24/2019: BUN 16; Creatinine, Ser 1.45; Potassium 4.7; Sodium 141   Lipid Panel Jan 2019 LDL 78 Lipid Panel     Component Value Date/Time   CHOL 176 09/10/2010 0623    TRIG 94 09/10/2010 0623   HDL 50 09/10/2010 0623   CHOLHDL 3.5 09/10/2010 0623   VLDL 19 09/10/2010 0623   LDLCALC 107 (H) 09/10/2010 1610    ASSESSMENT:    1. Longstanding persistent atrial fibrillation ([place])   2. Chronic systolic CHF (congestive heart failure) ([place])   3. Coronary artery disease involving coronary bypass graft of native heart without angina pectoris   4. Essential hypertension   5. Chronic obstructive pulmonary disease, unspecified COPD type ([place])   6. CHB (complete heart block) (HCC)   7. Pacemaker   8. Hyperlipidemia LDL goal <70   9. Diabetes mellitus due to underlying condition with stage 3b chronic kidney disease, with long-term current use of insulin ([place])   10. Stage 3b chronic kidney disease      PLAN:  In order of problems listed above:  1. Longstanding persistent atrial flutter/fibrillation: On anticoagulation. Electrocardiography and even a brief superficial check on his pacemaker may suggest that he has normal atrial rhythm due to the regular ventricular paced rhythm, atrial flutter waves without fall in the post ventricular refractory period and due to frequent flutter under sensing.  Previous attempts at overdrive pacing were unsuccessful.  The arrhythmia itself is asymptomatic.  CHA2DS2-VASc 6 (age 36, CAD, HTN, DM).  Continued attempts at return to sinus rhythm and use of potentially toxic drug such as amiodarone are not beneficial. We will discontinue the amiodarone permanently. 2. CHF:  Most recent LVEF by echocardiogram January 2020 40-45%, stable over time.  Appears clinically euvolemic.  NYHA functional class 1-2 .On metoprolol and hydralazine, not on RAAS inhibitors due to issues- with worsening renal function throughout 2020. 3. CAD s/p CABG: Asymptomatic, occluded native arteries, graft dependent (  all grafts patent at the cardiac catheterization 2017). 4. HTN: excellent control 5. COPD: Had some problems with wheezing on carvedilol, tolerating  metoprolol without problems. Wheezing a little today. Steroids caused hypoglycemia. 6. CHB: Pacemaker dependent. Today he does have an underlying escape rhythm, but this has not consistently been present.   7. PPM: Dual-chamber device reprogrammed VVIR. May be a candidate for CRT upgrade if LVEF declines further  in the future. Continue remote downloads every 3 months and yearly office visits. 8. HLP: On rosuvastatin. Due for recheck of lipid profile, target LDL less than 70.   9. DM: Recent hemoglobin A1c slightly elevated at 9.2 diabetes %. 10. CKD 3b: Creatinine seems to be at baseline around 1.5, GFR around 40.   Medication Adjustments/Labs and Tests Ordered: Current medicines are reviewed at length with the patient today.  Concerns regarding medicines are outlined above.  Medication changes, Labs and Tests ordered today are listed in the Patient Instructions below. There are no Patient Instructions on file for this visit.   Signed, Sanda Klein, MD  11/12/2019 9:14 AM    [facility] [address], [place], [place]  [address] Phone: [phone]; Fax: [phone]

## 2019-11-13 ENCOUNTER — Other Ambulatory Visit: Payer: Self-pay

## 2019-11-13 ENCOUNTER — Encounter: Payer: Self-pay | Admitting: Cardiovascular Disease

## 2019-11-13 ENCOUNTER — Ambulatory Visit (INDEPENDENT_AMBULATORY_CARE_PROVIDER_SITE_OTHER): Payer: Medicare Other | Admitting: Cardiovascular Disease

## 2019-11-13 VITALS — BP 112/57 | HR 76 | Ht 71.0 in | Wt 211.2 lb

## 2019-11-13 DIAGNOSIS — I255 Ischemic cardiomyopathy: Secondary | ICD-10-CM

## 2019-11-13 DIAGNOSIS — I25810 Atherosclerosis of coronary artery bypass graft(s) without angina pectoris: Secondary | ICD-10-CM

## 2019-11-13 DIAGNOSIS — J449 Chronic obstructive pulmonary disease, unspecified: Secondary | ICD-10-CM

## 2019-11-13 DIAGNOSIS — E0821 Diabetes mellitus due to underlying condition with diabetic nephropathy: Secondary | ICD-10-CM

## 2019-11-13 DIAGNOSIS — I442 Atrioventricular block, complete: Secondary | ICD-10-CM

## 2019-11-13 DIAGNOSIS — I5022 Chronic systolic (congestive) heart failure: Secondary | ICD-10-CM | POA: Diagnosis not present

## 2019-11-13 DIAGNOSIS — I4811 Longstanding persistent atrial fibrillation: Secondary | ICD-10-CM

## 2019-11-13 DIAGNOSIS — Z794 Long term (current) use of insulin: Secondary | ICD-10-CM

## 2019-11-13 DIAGNOSIS — E785 Hyperlipidemia, unspecified: Secondary | ICD-10-CM

## 2019-11-13 DIAGNOSIS — I10 Essential (primary) hypertension: Secondary | ICD-10-CM | POA: Diagnosis not present

## 2019-11-13 DIAGNOSIS — N1832 Chronic kidney disease, stage 3b: Secondary | ICD-10-CM

## 2019-11-13 DIAGNOSIS — Z950 Presence of cardiac pacemaker: Secondary | ICD-10-CM

## 2019-11-13 NOTE — Patient Instructions (Addendum)
Medication Instructions:  STOP the Amiodarone  *If you need a refill on your cardiac medications before your next appointment, please call your pharmacy*   Lab Work: None ordered If you have labs (blood work) drawn today and your tests are completely normal, you will receive your results only by: Marland Kitchen MyChart Message (if you have MyChart) OR . A paper copy in the mail If you have any lab test that is abnormal or we need to change your treatment, we will call you to review the results.   Testing/Procedures: None ordered   Follow-Up: At [facility], you and your health needs are our priority.  As part of our continuing mission to provide you with exceptional heart care, we have created designated Provider Care Teams.  These Care Teams include your primary Cardiologist (physician) and Advanced Practice Providers (APPs -  Physician Assistants and Nurse Practitioners) who all work together to provide you with the care you need, when you need it.  We recommend signing up for the patient portal called "MyChart".  Sign up information is provided on this After Visit Summary.  MyChart is used to connect with patients for Virtual Visits (Telemedicine).  Patients are able to view lab/test results, encounter notes, upcoming appointments, etc.  Non-urgent messages can be sent to your provider as well.   To learn more about what you can do with MyChart, go to [email or web address].    Your next appointment:   12 month(s)  The format for your next appointment:   In Person  Provider:   Sanda Klein, MD

## 2019-11-22 ENCOUNTER — Ambulatory Visit (INDEPENDENT_AMBULATORY_CARE_PROVIDER_SITE_OTHER): Payer: Medicare Other | Admitting: *Deleted

## 2019-11-22 DIAGNOSIS — I442 Atrioventricular block, complete: Secondary | ICD-10-CM | POA: Diagnosis not present

## 2019-11-22 LAB — CUP PACEART REMOTE DEVICE CHECK
Battery Impedance: 1113 Ohm
Battery Remaining Longevity: 61 mo
Battery Voltage: 2.78 V
Brady Statistic RV Percent Paced: 97 %
Date Time Interrogation Session: 20210917081719
Implantable Lead Implant Date: 20140318
Implantable Lead Implant Date: 20140318
Implantable Lead Location: 753859
Implantable Lead Location: 753860
Implantable Lead Model: 5076
Implantable Lead Model: 5076
Implantable Pulse Generator Implant Date: 20140318
Lead Channel Impedance Value: 586 Ohm
Lead Channel Impedance Value: 67 Ohm
Lead Channel Pacing Threshold Amplitude: 1.125 V
Lead Channel Pacing Threshold Pulse Width: 0.4 ms
Lead Channel Setting Pacing Amplitude: 2.5 V
Lead Channel Setting Pacing Pulse Width: 0.4 ms
Lead Channel Setting Sensing Sensitivity: 4 mV

## 2019-11-25 ENCOUNTER — Other Ambulatory Visit: Payer: Self-pay | Admitting: *Deleted

## 2019-11-25 MED ORDER — APIXABAN 5 MG PO TABS: ORAL_TABLET | ORAL | 1 refills | Status: DC

## 2019-11-25 NOTE — Progress Notes (Signed)
Remote pacemaker transmission.   

## 2020-01-05 ENCOUNTER — Other Ambulatory Visit: Payer: Self-pay | Admitting: Internal Medicine

## 2020-02-06 ENCOUNTER — Telehealth: Payer: Self-pay | Admitting: Cardiovascular Disease

## 2020-02-06 MED ORDER — APIXABAN 5 MG PO TABS: 5.0000 mg | ORAL_TABLET | Freq: Two times a day (BID) | ORAL | 1 refills | Status: DC

## 2020-02-06 NOTE — Telephone Encounter (Signed)
Pt c/o medication issue:  1. Name of Medication: apixaban (ELIQUIS) 5 MG TABS tablet [[id number]]    2. How are you currently taking this medication (dosage and times per day)? He is currently taking 5 mg 2 times daily   3. Are you having a reaction (difficulty breathing--STAT)? no  4. What is your medication issue? They unsure how he is suppose to be taking this med   Best number [phone]

## 2020-02-06 NOTE — Telephone Encounter (Signed)
Called and spoke with grandaughter (ok per pt)she states that she is confused about what pt's Eliquis is pt has taken 5g this morning and she did a refill and the bottle says 2.5mg  BID. This is incorrect. Will review with PharMD and call her back. D/w Raquel PharMD and she states that indeed pt should be taking Eliquis 5mg  BID she has changed in the chart. Returned call to granddaughter informed pt current dose should be Eliquis 5mg  BID and new rx was sent today so continue 5mg  and call when refill is needed to avoid confusion. Verbalized understanding. No further questions.

## 2020-02-21 ENCOUNTER — Ambulatory Visit (INDEPENDENT_AMBULATORY_CARE_PROVIDER_SITE_OTHER): Payer: Medicare Other

## 2020-02-21 DIAGNOSIS — I442 Atrioventricular block, complete: Secondary | ICD-10-CM | POA: Diagnosis not present

## 2020-02-21 LAB — CUP PACEART REMOTE DEVICE CHECK
Battery Impedance: 1193 Ohm
Battery Remaining Longevity: 61 mo
Battery Voltage: 2.77 V
Brady Statistic RV Percent Paced: 98 %
Date Time Interrogation Session: 20211217122558
Implantable Lead Implant Date: 20140318
Implantable Lead Implant Date: 20140318
Implantable Lead Location: 753859
Implantable Lead Location: 753860
Implantable Lead Model: 5076
Implantable Lead Model: 5076
Implantable Pulse Generator Implant Date: 20140318
Lead Channel Impedance Value: 67 Ohm
Lead Channel Impedance Value: 788 Ohm
Lead Channel Pacing Threshold Amplitude: 1.125 V
Lead Channel Pacing Threshold Pulse Width: 0.4 ms
Lead Channel Setting Pacing Amplitude: 2.5 V
Lead Channel Setting Pacing Pulse Width: 0.4 ms
Lead Channel Setting Sensing Sensitivity: 4 mV

## 2020-02-25 ENCOUNTER — Other Ambulatory Visit: Payer: Self-pay | Admitting: Internal Medicine

## 2020-03-05 NOTE — Progress Notes (Signed)
Remote pacemaker transmission.   

## 2020-03-20 DIAGNOSIS — D510 Vitamin B12 deficiency anemia due to intrinsic factor deficiency: Secondary | ICD-10-CM | POA: Diagnosis not present

## 2020-04-01 DIAGNOSIS — E785 Hyperlipidemia, unspecified: Secondary | ICD-10-CM | POA: Diagnosis not present

## 2020-04-01 DIAGNOSIS — N1832 Chronic kidney disease, stage 3b: Secondary | ICD-10-CM | POA: Diagnosis not present

## 2020-04-01 DIAGNOSIS — I10 Essential (primary) hypertension: Secondary | ICD-10-CM | POA: Diagnosis not present

## 2020-04-01 DIAGNOSIS — M179 Osteoarthritis of knee, unspecified: Secondary | ICD-10-CM | POA: Diagnosis not present

## 2020-04-01 DIAGNOSIS — E1121 Type 2 diabetes mellitus with diabetic nephropathy: Secondary | ICD-10-CM | POA: Diagnosis not present

## 2020-04-01 DIAGNOSIS — I509 Heart failure, unspecified: Secondary | ICD-10-CM | POA: Diagnosis not present

## 2020-04-01 DIAGNOSIS — I2510 Atherosclerotic heart disease of native coronary artery without angina pectoris: Secondary | ICD-10-CM | POA: Diagnosis not present

## 2020-04-01 DIAGNOSIS — J449 Chronic obstructive pulmonary disease, unspecified: Secondary | ICD-10-CM | POA: Diagnosis not present

## 2020-04-01 DIAGNOSIS — E7800 Pure hypercholesterolemia, unspecified: Secondary | ICD-10-CM | POA: Diagnosis not present

## 2020-04-01 DIAGNOSIS — E1169 Type 2 diabetes mellitus with other specified complication: Secondary | ICD-10-CM | POA: Diagnosis not present

## 2020-04-01 DIAGNOSIS — G4700 Insomnia, unspecified: Secondary | ICD-10-CM | POA: Diagnosis not present

## 2020-04-22 NOTE — Telephone Encounter (Signed)
Opened in Error-closing encounter

## 2020-04-23 DIAGNOSIS — D509 Iron deficiency anemia, unspecified: Secondary | ICD-10-CM | POA: Diagnosis not present

## 2020-04-23 DIAGNOSIS — E785 Hyperlipidemia, unspecified: Secondary | ICD-10-CM | POA: Diagnosis not present

## 2020-04-23 DIAGNOSIS — E1121 Type 2 diabetes mellitus with diabetic nephropathy: Secondary | ICD-10-CM | POA: Diagnosis not present

## 2020-04-23 DIAGNOSIS — E119 Type 2 diabetes mellitus without complications: Secondary | ICD-10-CM | POA: Diagnosis not present

## 2020-04-23 DIAGNOSIS — E538 Deficiency of other specified B group vitamins: Secondary | ICD-10-CM | POA: Diagnosis not present

## 2020-04-23 DIAGNOSIS — E7800 Pure hypercholesterolemia, unspecified: Secondary | ICD-10-CM | POA: Diagnosis not present

## 2020-04-23 DIAGNOSIS — R448 Other symptoms and signs involving general sensations and perceptions: Secondary | ICD-10-CM | POA: Diagnosis not present

## 2020-04-23 DIAGNOSIS — D510 Vitamin B12 deficiency anemia due to intrinsic factor deficiency: Secondary | ICD-10-CM | POA: Diagnosis not present

## 2020-04-23 DIAGNOSIS — I10 Essential (primary) hypertension: Secondary | ICD-10-CM | POA: Diagnosis not present

## 2020-04-23 DIAGNOSIS — R946 Abnormal results of thyroid function studies: Secondary | ICD-10-CM | POA: Diagnosis not present

## 2020-04-23 DIAGNOSIS — M179 Osteoarthritis of knee, unspecified: Secondary | ICD-10-CM | POA: Diagnosis not present

## 2020-04-29 DIAGNOSIS — E119 Type 2 diabetes mellitus without complications: Secondary | ICD-10-CM | POA: Diagnosis not present

## 2020-04-29 DIAGNOSIS — H25813 Combined forms of age-related cataract, bilateral: Secondary | ICD-10-CM | POA: Diagnosis not present

## 2020-04-29 DIAGNOSIS — H0102B Squamous blepharitis left eye, upper and lower eyelids: Secondary | ICD-10-CM | POA: Diagnosis not present

## 2020-04-29 DIAGNOSIS — H0102A Squamous blepharitis right eye, upper and lower eyelids: Secondary | ICD-10-CM | POA: Diagnosis not present

## 2020-05-04 DIAGNOSIS — D649 Anemia, unspecified: Secondary | ICD-10-CM | POA: Diagnosis not present

## 2020-05-04 DIAGNOSIS — E1121 Type 2 diabetes mellitus with diabetic nephropathy: Secondary | ICD-10-CM | POA: Diagnosis not present

## 2020-05-04 DIAGNOSIS — I2510 Atherosclerotic heart disease of native coronary artery without angina pectoris: Secondary | ICD-10-CM | POA: Diagnosis not present

## 2020-05-04 DIAGNOSIS — E119 Type 2 diabetes mellitus without complications: Secondary | ICD-10-CM | POA: Diagnosis not present

## 2020-05-04 DIAGNOSIS — N179 Acute kidney failure, unspecified: Secondary | ICD-10-CM | POA: Diagnosis not present

## 2020-05-04 DIAGNOSIS — E039 Hypothyroidism, unspecified: Secondary | ICD-10-CM | POA: Diagnosis not present

## 2020-05-04 DIAGNOSIS — N1832 Chronic kidney disease, stage 3b: Secondary | ICD-10-CM | POA: Diagnosis not present

## 2020-05-04 DIAGNOSIS — J449 Chronic obstructive pulmonary disease, unspecified: Secondary | ICD-10-CM | POA: Diagnosis not present

## 2020-05-04 DIAGNOSIS — E7800 Pure hypercholesterolemia, unspecified: Secondary | ICD-10-CM | POA: Diagnosis not present

## 2020-05-04 DIAGNOSIS — I509 Heart failure, unspecified: Secondary | ICD-10-CM | POA: Diagnosis not present

## 2020-05-19 ENCOUNTER — Telehealth: Payer: Self-pay | Admitting: *Deleted

## 2020-05-19 NOTE — Telephone Encounter (Signed)
Left message for Jesus Oconnor to call our office in order to confirm the for faxed over today to [facility]. Need to confirm is the request just for clearance on pt's PPM-[employer]tr[employer]-([place] [facility] or is pt needing cardiac clearance as well. Clearance request states procedure is 05/21/20 for cataract extraction.

## 2020-05-21 DIAGNOSIS — I2510 Atherosclerotic heart disease of native coronary artery without angina pectoris: Secondary | ICD-10-CM | POA: Diagnosis not present

## 2020-05-21 DIAGNOSIS — D509 Iron deficiency anemia, unspecified: Secondary | ICD-10-CM | POA: Diagnosis not present

## 2020-05-21 DIAGNOSIS — E1169 Type 2 diabetes mellitus with other specified complication: Secondary | ICD-10-CM | POA: Diagnosis not present

## 2020-05-21 DIAGNOSIS — H2511 Age-related nuclear cataract, right eye: Secondary | ICD-10-CM | POA: Diagnosis not present

## 2020-05-21 DIAGNOSIS — J449 Chronic obstructive pulmonary disease, unspecified: Secondary | ICD-10-CM | POA: Diagnosis not present

## 2020-05-21 DIAGNOSIS — G4700 Insomnia, unspecified: Secondary | ICD-10-CM | POA: Diagnosis not present

## 2020-05-21 DIAGNOSIS — E785 Hyperlipidemia, unspecified: Secondary | ICD-10-CM | POA: Diagnosis not present

## 2020-05-21 DIAGNOSIS — E1121 Type 2 diabetes mellitus with diabetic nephropathy: Secondary | ICD-10-CM | POA: Diagnosis not present

## 2020-05-21 DIAGNOSIS — E119 Type 2 diabetes mellitus without complications: Secondary | ICD-10-CM | POA: Diagnosis not present

## 2020-05-21 DIAGNOSIS — I509 Heart failure, unspecified: Secondary | ICD-10-CM | POA: Diagnosis not present

## 2020-05-21 DIAGNOSIS — I10 Essential (primary) hypertension: Secondary | ICD-10-CM | POA: Diagnosis not present

## 2020-05-21 DIAGNOSIS — E039 Hypothyroidism, unspecified: Secondary | ICD-10-CM | POA: Diagnosis not present

## 2020-05-22 ENCOUNTER — Ambulatory Visit (INDEPENDENT_AMBULATORY_CARE_PROVIDER_SITE_OTHER): Payer: Medicare Other

## 2020-05-22 DIAGNOSIS — I255 Ischemic cardiomyopathy: Secondary | ICD-10-CM | POA: Diagnosis not present

## 2020-05-22 LAB — CUP PACEART REMOTE DEVICE CHECK
Battery Impedance: 1326 Ohm
Battery Remaining Longevity: 56 mo
Battery Voltage: 2.77 V
Brady Statistic RV Percent Paced: 98 %
Date Time Interrogation Session: 20220318113234
Implantable Lead Implant Date: 20140318
Implantable Lead Implant Date: 20140318
Implantable Lead Location: 753859
Implantable Lead Location: 753860
Implantable Lead Model: 5076
Implantable Lead Model: 5076
Implantable Pulse Generator Implant Date: 20140318
Lead Channel Impedance Value: 67 Ohm
Lead Channel Impedance Value: 744 Ohm
Lead Channel Pacing Threshold Amplitude: 1 V
Lead Channel Pacing Threshold Pulse Width: 0.4 ms
Lead Channel Setting Pacing Amplitude: 2.5 V
Lead Channel Setting Pacing Pulse Width: 0.4 ms
Lead Channel Setting Sensing Sensitivity: 4 mV

## 2020-05-29 NOTE — Progress Notes (Signed)
Remote pacemaker transmission.   

## 2020-06-08 DIAGNOSIS — E538 Deficiency of other specified B group vitamins: Secondary | ICD-10-CM | POA: Diagnosis not present

## 2020-06-14 DIAGNOSIS — H2512 Age-related nuclear cataract, left eye: Secondary | ICD-10-CM | POA: Diagnosis not present

## 2020-06-18 DIAGNOSIS — H2512 Age-related nuclear cataract, left eye: Secondary | ICD-10-CM | POA: Diagnosis not present

## 2020-06-29 DIAGNOSIS — E039 Hypothyroidism, unspecified: Secondary | ICD-10-CM | POA: Diagnosis not present

## 2020-07-20 ENCOUNTER — Other Ambulatory Visit: Payer: Self-pay | Admitting: Internal Medicine

## 2020-07-22 DIAGNOSIS — E538 Deficiency of other specified B group vitamins: Secondary | ICD-10-CM | POA: Diagnosis not present

## 2020-07-28 DIAGNOSIS — E1121 Type 2 diabetes mellitus with diabetic nephropathy: Secondary | ICD-10-CM | POA: Diagnosis not present

## 2020-07-28 DIAGNOSIS — G4700 Insomnia, unspecified: Secondary | ICD-10-CM | POA: Diagnosis not present

## 2020-07-28 DIAGNOSIS — I2510 Atherosclerotic heart disease of native coronary artery without angina pectoris: Secondary | ICD-10-CM | POA: Diagnosis not present

## 2020-07-28 DIAGNOSIS — J449 Chronic obstructive pulmonary disease, unspecified: Secondary | ICD-10-CM | POA: Diagnosis not present

## 2020-07-28 DIAGNOSIS — I10 Essential (primary) hypertension: Secondary | ICD-10-CM | POA: Diagnosis not present

## 2020-07-28 DIAGNOSIS — E039 Hypothyroidism, unspecified: Secondary | ICD-10-CM | POA: Diagnosis not present

## 2020-07-28 DIAGNOSIS — N1832 Chronic kidney disease, stage 3b: Secondary | ICD-10-CM | POA: Diagnosis not present

## 2020-07-28 DIAGNOSIS — I509 Heart failure, unspecified: Secondary | ICD-10-CM | POA: Diagnosis not present

## 2020-07-28 DIAGNOSIS — E1169 Type 2 diabetes mellitus with other specified complication: Secondary | ICD-10-CM | POA: Diagnosis not present

## 2020-07-28 DIAGNOSIS — E785 Hyperlipidemia, unspecified: Secondary | ICD-10-CM | POA: Diagnosis not present

## 2020-07-28 DIAGNOSIS — E119 Type 2 diabetes mellitus without complications: Secondary | ICD-10-CM | POA: Diagnosis not present

## 2020-08-05 ENCOUNTER — Telehealth: Payer: Self-pay | Admitting: Internal Medicine

## 2020-08-05 ENCOUNTER — Encounter: Payer: Self-pay | Admitting: Cardiovascular Disease

## 2020-08-05 NOTE — Telephone Encounter (Signed)
Pt c/o swelling: STAT is pt has developed SOB within 24 hours  1. If swelling, where is the swelling located? Right leg  2. How much weight have you gained and in what time span? 5 lbs in a week  3. Have you gained 3 pounds in a day or 5 pounds in a week? 5 lbs in a week  4. Do you have a log of your daily weights (if so, list)? Was not able to look up weights while on the phone  5. Are you currently taking a fluid pill? yes  6. Are you currently SOB? No, but does get SOB when going outside  7. Have you traveled recently? no  Patient's daughter states the patient has swelling in his right leg. She states he also has some scabs on the leg and is diabetic. She states he has gained 5 lbs in a week, but was not able to look at his weights from his appointments, while on the phone. She states he does get SOB when going outside, but is not currently SOB.

## 2020-08-05 NOTE — Telephone Encounter (Signed)
Spoke with pt and daughter, she reports his right leg seems to be swollen more than normal. There is a scab on his leg that he has picked at and it is now oozing. She weighed him today and his weight is up at least 5 lbs if not more by her report. He is not having SOB or orthopnea but does get SOB when he is outside but not all the time. Patient scheduled to be seen tomorrow by the APP.

## 2020-08-05 NOTE — Telephone Encounter (Signed)
error 

## 2020-08-06 ENCOUNTER — Encounter: Payer: Self-pay | Admitting: Physician Assistant

## 2020-08-06 ENCOUNTER — Other Ambulatory Visit: Payer: Self-pay

## 2020-08-06 ENCOUNTER — Ambulatory Visit: Payer: Medicare Other | Admitting: Physician Assistant

## 2020-08-06 VITALS — BP 130/73 | HR 72 | Ht 71.0 in | Wt 224.2 lb

## 2020-08-06 DIAGNOSIS — I4811 Longstanding persistent atrial fibrillation: Secondary | ICD-10-CM

## 2020-08-06 DIAGNOSIS — E119 Type 2 diabetes mellitus without complications: Secondary | ICD-10-CM

## 2020-08-06 DIAGNOSIS — E785 Hyperlipidemia, unspecified: Secondary | ICD-10-CM | POA: Diagnosis not present

## 2020-08-06 DIAGNOSIS — I5033 Acute on chronic diastolic (congestive) heart failure: Secondary | ICD-10-CM

## 2020-08-06 DIAGNOSIS — I25810 Atherosclerosis of coronary artery bypass graft(s) without angina pectoris: Secondary | ICD-10-CM | POA: Diagnosis not present

## 2020-08-06 DIAGNOSIS — I10 Essential (primary) hypertension: Secondary | ICD-10-CM | POA: Diagnosis not present

## 2020-08-06 DIAGNOSIS — Z950 Presence of cardiac pacemaker: Secondary | ICD-10-CM | POA: Diagnosis not present

## 2020-08-06 DIAGNOSIS — N183 Chronic kidney disease, stage 3 unspecified: Secondary | ICD-10-CM | POA: Diagnosis not present

## 2020-08-06 MED ORDER — FUROSEMIDE 40 MG PO TABS: 80.0000 mg | ORAL_TABLET | Freq: Every day | ORAL | 3 refills | Status: DC

## 2020-08-06 NOTE — Progress Notes (Signed)
Cardiology Office Note:    Date:  08/09/2020   ID:  Jesus Oconnor, DOB 03-Sep-1933, MRN [id number]  PCP:  Ileana Ladd, MD   [facility] Providers Cardiologist:  Thurmon Fair, MD {  Referring MD: Ileana Ladd, MD   Chief Complaint  Patient presents with  . Follow-up    Seen for Dr. Royann Shivers    History of Present Illness:    Jesus Oconnor is a 87 y.o. male with a hx of CAD s/p CABG, chronic diastolic heart failure, hyperlipidemia, hypertension, DM2, history of second-degree AV block s/p Medtronic dual-chamber PPM in 2014, paroxysmal atrial tachycardia, atrial flutter and Longstanding persistentatrial fibrillation on Eliquis.  Echocardiogram in 2017 showed EF 30 to 35%.  Subsequent cardiac catheterization revealed EF 45 to 50%, severe native CAD, patent LIMA to LAD, patent sequential SVG to D1 and D2, patent SVG to distal left circumflex, patent SVG to PLA branch of RCA.  Medical therapy was recommended.  He had COVID-19 vaccine, however also had breakthrough COVID-19 infection in 2021 and received monoclonal antibody.  He was last seen by Dr. Royann Shivers on 11/13/2019 at which time he was doing well.  Device interrogation reveals he has been in atrial fibrillation for several months at the time, therefore it was recommended to continue with rate control and to discontinue amiodarone.  Patient presents today for evaluation of lower extremity edema which has been worsening for the past few months.  On examination, he has 2+ pitting edema in the right lower extremity, 1+ pitting edema in the left lower extremity.  The right lower extremity is the side where he had a vein harvesting for the bypass surgery years ago.  On exam, he does have elevated JVD, however his lung is clear.  He denies any orthopnea or PND.  Instead of taking 40 mg twice a day of Lasix, he says he is actually taking 2 tablets of Lasix every morning which is 80 mg daily.  I recommended increase Lasix to 80 mg twice  daily for 3 days before going back to 80 mg daily thereafter.  I will also increase his potassium chloride to 20 meq twice a day for 3 days before going back to 20 meq daily thereafter.  He will need a basic metabolic panel today and a follow-up in 3 weeks for reassessment.  Note, last lab work was done at [facility] in February 2022, creatinine was 1.6, BUN 20, GFR 40.  Past Medical History:  Diagnosis Date  . Anemia    history of  . Arthritis   . Atrial flutter (HCC)    severe bradycardia  . CHF (congestive heart failure) (HCC)   . Constipation   . COPD (chronic obstructive pulmonary disease) (HCC)   . Coronary artery disease   . Cough   . Diarrhea   . Diastolic dysfunction    Echo 09/11/2010-grade I diastolic dysfunction,EF 55-60%  . DM2 (diabetes mellitus, type 2) (HCC)   . Dyslipidemia   . History of kidney stones   . HTN (hypertension)   . Hyperlipidemia   . Leg swelling   . Mobitz type 1 second degree atrioventricular block   . Pacemaker 05/22/2012   Dr  Croitoru    Medtronic Adapta  . 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 SVG to firts & second diagonals,SVG to forth posterolateral branch to RCA  . Sore throat   . Wheezing     Past Surgical History:  Procedure Laterality Date  . CABG x5  09/13/2010   Dr Farrel Conners to LAD,SVG to obtuse marginal,seq. SVG to first & second diagonals,SVG to fourth posterolateral branch RCA  . CARDIAC CATHETERIZATION  09/10/2010   severe LAD,D1,D2 & ostial D2  . CARDIAC CATHETERIZATION N/A 08/28/2015   Procedure: Right/Left Heart Cath and Coronary/Graft Angiography;  Surgeon: Troy Sine, MD;  Location: [place] [facility];  Service: Cardiovascular;  Laterality: N/A;  . CHOLECYSTECTOMY  october 2012  . CORONARY ARTERY BYPASS GRAFT    . INSERT / REPLACE / REMOVE PACEMAKER  05/22/2012    dual chamber  . PERMANENT PACEMAKER INSERTION N/A 05/22/2012   Procedure: PERMANENT PACEMAKER INSERTION;  Surgeon: Sanda Klein, MD;  Location: [place] [facility];  Service: Cardiovascular;  Laterality: N/A;  . repair of left indirect inguinal hernia with mesh.    . repair of umbilical hernia.    Marland Kitchen TOTAL KNEE ARTHROPLASTY Right 09/26/2016   Procedure: TOTAL KNEE ARTHROPLASTY;  Surgeon: Frederik Pear, MD;  Location: [place];  Service: Orthopedics;  Laterality: Right;    Current Medications: Current Meds  Medication Sig  . apixaban (ELIQUIS) 5 MG TABS tablet Take 1 tablet (5 mg total) by mouth 2 (two) times daily.  . Cholecalciferol (VITAMIN D3) 125 MCG (5000 UT) TABS Take 5,000 Units by mouth daily.  . Cyanocobalamin (B-12) 1000 MCG/ML KIT Inject 1 Dose as directed every 30 (thirty) days.   . ferrous sulfate 325 (65 FE) MG tablet Take 325 mg by mouth daily with breakfast.  . fluticasone furoate-vilanterol (BREO ELLIPTA) 200-25 MCG/INH AEPB Inhale 1 puff into the lungs 2 (two) times daily.  Marland Kitchen LANTUS SOLOSTAR 100 UNIT/ML Solostar Pen Inject 10 Units into the skin at bedtime.  . metoprolol tartrate (LOPRESSOR) 25 MG tablet TAKE 1 TABLET BY MOUTH  TWICE DAILY  . ONETOUCH VERIO test strip daily. As directed  . potassium chloride SA (KLOR-CON) 20 MEQ tablet TAKE 1 TABLET(20 MEQ) BY MOUTH DAILY  . rosuvastatin (CRESTOR) 40 MG tablet Take 40 mg by mouth daily.  . traZODone (DESYREL) 50 MG tablet Take 50 mg by mouth at bedtime as needed for sleep.   Grant Ruts INHUB 250-50 MCG/DOSE AEPB Inhale 1 puff into the lungs 2 (two) times a day.  . [DISCONTINUED] furosemide (LASIX) 40 MG tablet TAKE 1 TABLET BY MOUTH  TWICE DAILY (Patient taking differently: Take 80 mg by mouth daily.)     Allergies:   No known allergies   Social History   Socioeconomic History  . Marital status: Widowed    Spouse name: Not on file  . Number of children: 2  . Years of education: Not on file  . Highest education level: Not on file  Occupational History  . Not on file  Tobacco Use  . Smoking status: Former Smoker    Years: 40.00    Types:  Cigarettes    Quit date: 08/06/2010    Years since quitting: 10.0  . Smokeless tobacco: Never Used  Substance and Sexual Activity  . Alcohol use: Yes    Comment: 5-6 beers per week  . Drug use: Yes    Types: Marijuana    Comment: couple times a month  . Sexual activity: Not on file  Other Topics Concern  . Not on file  Social History Narrative  . Not on file   Social Determinants of Health   Financial Resource Strain: Not on file  Food Insecurity: Not on file  Transportation Needs: Not on file  Physical Activity: Not on file  Stress: Not on file  Social Connections: Not on file     Family History: The patient's family history includes Heart disease in his brother, father, and maternal uncle.  ROS:   Please see the history of present illness.     All other systems reviewed and are negative.  EKGs/Labs/Other Studies Reviewed:    The following studies were reviewed today:  Echo 03/29/2018 LV EF: 40% -  45%   -------------------------------------------------------------------  Indications:   I25.5 Cardiomyopathy - ischemic.   -------------------------------------------------------------------  History:  PMH: Pacemaker. Mobitz type 1 second degree  atrioventricular block. Atrial flutter. Coronary artery disease.  Chronic obstructive pulmonary disease. Risk factors: Diabetes  mellitus. Dyslipidemia.   -------------------------------------------------------------------  Study Conclusions   - Left ventricle: The cavity size was normal. There was moderate  concentric hypertrophy. Systolic function was mildly to  moderately reduced. The estimated ejection fraction was in the  range of 40% to 45%. Features are consistent with a pseudonormal  left ventricular filling pattern, with concomitant abnormal  relaxation and increased filling pressure (grade 2 diastolic  dysfunction).  - Ventricular septum: Septal motion showed paradox. These changes  are  consistent with right ventricular pacing.  - Mitral valve: Calcified annulus.  - Left atrium: The atrium was severely dilated.  - Right ventricle: Systolic function was moderately reduced.  - Right atrium: The atrium was moderately dilated.  - Pulmonary arteries: Systolic pressure was mildly increased. PA  peak pressure: 35 mm Hg (S).   EKG:  EKG is ordered today.  EKG demonstrated ventricularly paced rhythm.  Recent Labs: 08/06/2020: BUN 21; Creatinine, Ser 1.40; Potassium 4.0; Sodium 141  Recent Lipid Panel    Component Value Date/Time   CHOL 176 09/10/2010 0623   TRIG 94 09/10/2010 0623   HDL 50 09/10/2010 0623   CHOLHDL 3.5 09/10/2010 0623   VLDL 19 09/10/2010 0623   LDLCALC 107 (H) 09/10/2010 0623     Risk Assessment/Calculations:    CHA2DS2-VASc Score = 6  This indicates a 9.7% annual risk of stroke. The patient's score is based upon: CHF History: Yes HTN History: Yes Diabetes History: Yes Stroke History: No Vascular Disease History: Yes Age Score: 2 Gender Score: 0      Physical Exam:    VS:  BP 130/73   Pulse 72   Ht $R'5\' 11"'GF$  (1.803 m)   Wt 224 lb 3.2 oz (101.7 kg)   SpO2 95%   BMI 31.27 kg/m     Wt Readings from Last 3 Encounters:  08/06/20 224 lb 3.2 oz (101.7 kg)  11/13/19 211 lb 3.2 oz (95.8 kg)  08/13/19 212 lb 12.8 oz (96.5 kg)     GEN:  Well nourished, well developed in no acute distress HEENT: Normal NECK: No JVD; No carotid bruits LYMPHATICS: No lymphadenopathy CARDIAC: RRR, no murmurs, rubs, gallops RESPIRATORY:  Clear to auscultation without rales, wheezing or rhonchi  ABDOMEN: Soft, non-tender, non-distended MUSCULOSKELETAL:  Bilateral leg edema, R>L; No deformity  SKIN: Warm and dry NEUROLOGIC:  Alert and oriented x 3 PSYCHIATRIC:  Normal affect   ASSESSMENT:    1. Acute on chronic diastolic heart failure ([place])   2. Coronary artery disease involving coronary bypass graft of native heart without angina pectoris   3. Primary  hypertension   4. Hyperlipidemia LDL goal <70   5. Controlled type 2 diabetes mellitus without complication, without long-term current use of insulin ([place])   6. Pacemaker   7. Longstanding persistent atrial  fibrillation ([place])   8. Stage 3 chronic kidney disease, unspecified whether stage 3a or 3b CKD ([place])    PLAN:    In order of problems listed above:  1. Acute on chronic diastolic heart failure: Right lower extremity worse than the left lower extremity.  According to the patient, he always has more swelling in the right lower extremity when compared to the left lower extremity.  Suspicion for DVT is low.  We will increase Lasix to 80 mg twice a day for 3 days before going back to the previous 80 mg daily thereafter.  We will also double up on potassium during those 3 days before going back to daily dosing afterward.  2. CAD s/p CABG: Denies any recent chest pain  3. Hypertension: Blood pressure stable   4. Hyperlipidemia: On Crestor  5. DM2: Managed by primary care provider  6. History of pacemaker: Stable on last interrogation in March 2022.  7. Longstanding persistent atrial fibrillation: On Eliquis.  Heart rate very well controlled.  8. CKD stage III: Most recent creatinine was 1.6.  GFR 40.  We will need to repeat basic metabolic panel.        Medication Adjustments/Labs and Tests Ordered: Current medicines are reviewed at length with the patient today.  Concerns regarding medicines are outlined above.  Orders Placed This Encounter  Procedures  . Basic metabolic panel   Meds ordered this encounter  Medications  . furosemide (LASIX) 40 MG tablet    Sig: Take 2 tablets (80 mg total) by mouth daily.    Dispense:  180 tablet    Refill:  3    Dose change    Patient Instructions  Medication Instructions:   INCREASE Lasix to 80 mg 2 times a day for 3 days, then RESUME Lasix 80 mg (2-40 mg tablets) daily  INCREASE Potassium to 20 meq 2 times a day for 3 days, then  RESUME Potassium 20 meq daily  *If you need a refill on your cardiac medications before your next appointment, please call your pharmacy*  Lab Work: Your physician recommends that you return for lab work TODAY:   BMET If you have labs (blood work) drawn today and your tests are completely normal, you will receive your results only by: Marland Kitchen MyChart Message (if you have MyChart) OR . A paper copy in the mail If you have any lab test that is abnormal or we need to change your treatment, we will call you to review the results.  Testing/Procedures: NONE ordered at this time of appointment   Follow-Up: At [facility], you and your health needs are our priority.  As part of our continuing mission to provide you with exceptional heart care, we have created designated Provider Care Teams.  These Care Teams include your primary Cardiologist (physician) and Advanced Practice Providers (APPs -  Physician Assistants and Nurse Practitioners) who all work together to provide you with the care you need, when you need it.  Your next appointment:   3 week(s)  The format for your next appointment:   In Person  Provider:   Almyra Deforest, PA-C  Other Instructions      Signed, Almyra Deforest, [place]  08/09/2020 12:28 AM    [place]

## 2020-08-06 NOTE — Patient Instructions (Addendum)
Medication Instructions:   INCREASE Lasix to 80 mg 2 times a day for 3 days, then RESUME Lasix 80 mg (2-40 mg tablets) daily  INCREASE Potassium to 20 meq 2 times a day for 3 days, then RESUME Potassium 20 meq daily  *If you need a refill on your cardiac medications before your next appointment, please call your pharmacy*  Lab Work: Your physician recommends that you return for lab work TODAY:   BMET If you have labs (blood work) drawn today and your tests are completely normal, you will receive your results only by: Marland Kitchen MyChart Message (if you have MyChart) OR . A paper copy in the mail If you have any lab test that is abnormal or we need to change your treatment, we will call you to review the results.  Testing/Procedures: NONE ordered at this time of appointment   Follow-Up: At [facility], you and your health needs are our priority.  As part of our continuing mission to provide you with exceptional heart care, we have created designated Provider Care Teams.  These Care Teams include your primary Cardiologist (physician) and Advanced Practice Providers (APPs -  Physician Assistants and Nurse Practitioners) who all work together to provide you with the care you need, when you need it.  Your next appointment:   3 week(s)  The format for your next appointment:   In Person  Provider:   Almyra Deforest, PA-C  Other Instructions

## 2020-08-07 LAB — BASIC METABOLIC PANEL
BUN/Creatinine Ratio: 15 (ref 10–24)
BUN: 21 mg/dL (ref 8–27)
CO2: 26 mmol/L (ref 20–29)
Calcium: 9.3 mg/dL (ref 8.6–10.2)
Chloride: 98 mmol/L (ref 96–106)
Creatinine, Ser: 1.4 mg/dL — ABNORMAL HIGH (ref 0.76–1.27)
Glucose: 212 mg/dL — ABNORMAL HIGH (ref 65–99)
Potassium: 4 mmol/L (ref 3.5–5.2)
Sodium: 141 mmol/L (ref 134–144)
eGFR: 49 mL/min/{1.73_m2} — ABNORMAL LOW (ref >59)

## 2020-08-09 ENCOUNTER — Encounter: Payer: Self-pay | Admitting: Physician Assistant

## 2020-08-14 DIAGNOSIS — E039 Hypothyroidism, unspecified: Secondary | ICD-10-CM | POA: Diagnosis not present

## 2020-08-14 NOTE — Addendum Note (Signed)
Addended by: Lubertha Sayres on: 08/14/2020 12:25 PM   Modules accepted: Orders

## 2020-08-20 DIAGNOSIS — Z0000 Encounter for general adult medical examination without abnormal findings: Secondary | ICD-10-CM | POA: Diagnosis not present

## 2020-08-20 DIAGNOSIS — E1121 Type 2 diabetes mellitus with diabetic nephropathy: Secondary | ICD-10-CM | POA: Diagnosis not present

## 2020-08-21 ENCOUNTER — Ambulatory Visit (INDEPENDENT_AMBULATORY_CARE_PROVIDER_SITE_OTHER): Payer: Medicare Other

## 2020-08-21 DIAGNOSIS — I442 Atrioventricular block, complete: Secondary | ICD-10-CM

## 2020-08-21 LAB — CUP PACEART REMOTE DEVICE CHECK
Battery Impedance: 1355 Ohm
Battery Remaining Longevity: 54 mo
Battery Voltage: 2.77 V
Brady Statistic RV Percent Paced: 98 %
Date Time Interrogation Session: 20220617103600
Implantable Lead Implant Date: 20140318
Implantable Lead Implant Date: 20140318
Implantable Lead Location: 753859
Implantable Lead Location: 753860
Implantable Lead Model: 5076
Implantable Lead Model: 5076
Implantable Pulse Generator Implant Date: 20140318
Lead Channel Impedance Value: 626 Ohm
Lead Channel Impedance Value: 67 Ohm
Lead Channel Pacing Threshold Amplitude: 0.875 V
Lead Channel Pacing Threshold Pulse Width: 0.4 ms
Lead Channel Setting Pacing Amplitude: 2.5 V
Lead Channel Setting Pacing Pulse Width: 0.4 ms
Lead Channel Setting Sensing Sensitivity: 4 mV

## 2020-08-25 DIAGNOSIS — I10 Essential (primary) hypertension: Secondary | ICD-10-CM | POA: Diagnosis not present

## 2020-08-25 DIAGNOSIS — E1121 Type 2 diabetes mellitus with diabetic nephropathy: Secondary | ICD-10-CM | POA: Diagnosis not present

## 2020-08-25 DIAGNOSIS — E119 Type 2 diabetes mellitus without complications: Secondary | ICD-10-CM | POA: Diagnosis not present

## 2020-08-25 DIAGNOSIS — G4700 Insomnia, unspecified: Secondary | ICD-10-CM | POA: Diagnosis not present

## 2020-08-25 DIAGNOSIS — E1169 Type 2 diabetes mellitus with other specified complication: Secondary | ICD-10-CM | POA: Diagnosis not present

## 2020-08-25 DIAGNOSIS — I509 Heart failure, unspecified: Secondary | ICD-10-CM | POA: Diagnosis not present

## 2020-08-25 DIAGNOSIS — J449 Chronic obstructive pulmonary disease, unspecified: Secondary | ICD-10-CM | POA: Diagnosis not present

## 2020-08-25 DIAGNOSIS — E039 Hypothyroidism, unspecified: Secondary | ICD-10-CM | POA: Diagnosis not present

## 2020-08-25 DIAGNOSIS — E785 Hyperlipidemia, unspecified: Secondary | ICD-10-CM | POA: Diagnosis not present

## 2020-08-25 DIAGNOSIS — D509 Iron deficiency anemia, unspecified: Secondary | ICD-10-CM | POA: Diagnosis not present

## 2020-08-25 DIAGNOSIS — N179 Acute kidney failure, unspecified: Secondary | ICD-10-CM | POA: Diagnosis not present

## 2020-08-27 ENCOUNTER — Ambulatory Visit: Payer: Medicare Other | Admitting: Physician Assistant

## 2020-09-01 ENCOUNTER — Other Ambulatory Visit: Payer: Self-pay

## 2020-09-09 NOTE — Progress Notes (Signed)
Remote pacemaker transmission.   

## 2020-09-20 DIAGNOSIS — N179 Acute kidney failure, unspecified: Secondary | ICD-10-CM | POA: Diagnosis not present

## 2020-09-20 DIAGNOSIS — D509 Iron deficiency anemia, unspecified: Secondary | ICD-10-CM | POA: Diagnosis not present

## 2020-09-20 DIAGNOSIS — I10 Essential (primary) hypertension: Secondary | ICD-10-CM | POA: Diagnosis not present

## 2020-09-20 DIAGNOSIS — E039 Hypothyroidism, unspecified: Secondary | ICD-10-CM | POA: Diagnosis not present

## 2020-09-20 DIAGNOSIS — E785 Hyperlipidemia, unspecified: Secondary | ICD-10-CM | POA: Diagnosis not present

## 2020-09-20 DIAGNOSIS — E119 Type 2 diabetes mellitus without complications: Secondary | ICD-10-CM | POA: Diagnosis not present

## 2020-09-20 DIAGNOSIS — N1832 Chronic kidney disease, stage 3b: Secondary | ICD-10-CM | POA: Diagnosis not present

## 2020-09-20 DIAGNOSIS — I2510 Atherosclerotic heart disease of native coronary artery without angina pectoris: Secondary | ICD-10-CM | POA: Diagnosis not present

## 2020-09-20 DIAGNOSIS — I509 Heart failure, unspecified: Secondary | ICD-10-CM | POA: Diagnosis not present

## 2020-09-20 DIAGNOSIS — E7800 Pure hypercholesterolemia, unspecified: Secondary | ICD-10-CM | POA: Diagnosis not present

## 2020-09-20 DIAGNOSIS — J449 Chronic obstructive pulmonary disease, unspecified: Secondary | ICD-10-CM | POA: Diagnosis not present

## 2020-09-25 DIAGNOSIS — E1121 Type 2 diabetes mellitus with diabetic nephropathy: Secondary | ICD-10-CM | POA: Diagnosis not present

## 2020-09-25 DIAGNOSIS — E538 Deficiency of other specified B group vitamins: Secondary | ICD-10-CM | POA: Diagnosis not present

## 2020-09-25 DIAGNOSIS — E7800 Pure hypercholesterolemia, unspecified: Secondary | ICD-10-CM | POA: Diagnosis not present

## 2020-09-25 DIAGNOSIS — E039 Hypothyroidism, unspecified: Secondary | ICD-10-CM | POA: Diagnosis not present

## 2020-09-25 DIAGNOSIS — I10 Essential (primary) hypertension: Secondary | ICD-10-CM | POA: Diagnosis not present

## 2020-09-28 ENCOUNTER — Telehealth: Payer: Self-pay | Admitting: Internal Medicine

## 2020-09-28 ENCOUNTER — Other Ambulatory Visit: Payer: Self-pay | Admitting: *Deleted

## 2020-09-28 MED ORDER — APIXABAN 5 MG PO TABS: 5.0000 mg | ORAL_TABLET | Freq: Two times a day (BID) | ORAL | 3 refills | Status: DC

## 2020-09-28 NOTE — Telephone Encounter (Signed)
Called the pharmacy to correct an overfill. For anticoagulants we are only allowed to give a max of 90 day supply with one refill and they have been sent a years supply. This is in efforts to make certain that pts are receiving the correct dosage especially when they are borderline needing a dose change. This pt has had elevated creatinine in the past and is 1.4 currently and a lot can happen in 6 months. Will route to pharmd team pool to make them and the supervisor aware. Also anticoagulants are only supposed to be refilled by cvrr.  Prescription refill request for Eliquis received. Indication:afib Last office visit:meng 08/06/20 Scr:1.4 08/06/20 Age: 54mWeight:101.7kg

## 2020-09-28 NOTE — Telephone Encounter (Signed)
S/w pt is aware Eliquis one tablet by mouth ( 5 mg) bid is being sent in today # 90 confirmed to requested pharmacy.

## 2020-09-28 NOTE — Telephone Encounter (Signed)
*  STAT* If patient is at the pharmacy, call can be transferred to refill team.   1. Which medications need to be refilled? (please list name of each medication and dose if known) apixaban (ELIQUIS) 5 MG TABS tablet  2. Which pharmacy/location (including street and city if local pharmacy) is medication to be sent to? [facility] on [address]  3. Do they need a 30 day or 90 day supply?90 day supply  The pt is out of this medication

## 2020-10-24 NOTE — Progress Notes (Signed)
Cardiology Office Note:    Date:  10/30/2020   ID:  Jesus Oconnor, DOB Nov 18, 1933, MRN [id number]  PCP:  Vernie Shanks, MD  Cardiologist:  Sanda Klein, MD  Electrophysiologist:  None   Referring MD: Vernie Shanks, MD   Chief Complaint: follow-up of CHF  History of Present Illness:    Jesus Oconnor is a 87 y.o. male with a history of CAD s/p CABG x5 in 09/2010, chronic combined CHF, paroxysmal atrial flutter and persistent atrial fibrillation on Eliquis, complete heart block s/p Medtronic dual chamber PPM in 2014 (pacemaker dependent), COPD, hypertension, hyperlipidemia, type 2 diabetes mellitus who is followed by Dr. Sallyanne Kuster and presents today for follow-up of CHF.  Echo in 07/2015 showed LVEF of 30-35% with diffuse hypokinesis and grade 2 diastolic dysfunction. Subsequent right/left heart catheterization in 08/2015 showed severe native CAD but all grafts were patent. Medical therapy was recommended. Last Echo in 03/2018 LVEF of 40-45% with grade 2 diastolic dysfunction as well as moderately reduced RV function. Device interrogation in 11/2019 at visit with Dr. Sallyanne Kuster showed patient had been in persistent atrial fibrillation for the past several months; therefore, it was recommended to continue rate control and Amiodarone was discontinued.  Patient was last seen by Almyra Deforest, PA-C, in 08/2020 at which time he reported worsening lower extremity over the last few months (worse on the right which was was the side of vein harvesting for CABG). Lungs were clear on exam but JVD was elevated. Lasix was increased to 41m twice daily for 3 days with instructions to then go back to 855mdaily. Potassium supplement was also increased while on higher dose of Lasix.  Patient presents today for follow-up.  Here with his daughter.  Patient states the increase dose of Lasix initially helped both his lower extremity edema and his weight.  However recently his weight has started to trend back up.   Lower extremity notes to better than before.  He has chronic shortness of breath, mainly with exertion, and this waxes and wanes.  He came down with URI symptoms about 3 weeks ago.  He tested negative for COVID.  Since then, it sounds like his breathing has been a little bit worse.  He still has a productive cough.  This wakes him up at night at times.  No orthopnea. Patient does also have COPD so I feel this is contributing to his dyspnea as well. He denies any chest pain but states he occasionally notes a tingling sensation around his pacemaker.  No significant palpitations but he says that sometimes he can hear his heart and his ears neck feels like it is beating hard.  No lightheadedness, dizziness, syncope.  No abnormal bleeding on Eliquis.  Past Medical History:  Diagnosis Date   Anemia    history of   Arthritis    Atrial flutter (HCC)    severe bradycardia   CHF (congestive heart failure) (HCC)    Constipation    COPD (chronic obstructive pulmonary disease) (HCC)    Coronary artery disease    Cough    Diarrhea    Diastolic dysfunction    Echo 7/09/06/5668-LIDCV diastolic dysfunction,EF 5501-31% DM2 (diabetes mellitus, type 2) (HCC)    Dyslipidemia    History of kidney stones    HTN (hypertension)    Hyperlipidemia    Leg swelling    Mobitz type 1 second degree atrioventricular block    Pacemaker 05/22/2012   Dr  CrSallyanne Kuster  Medtronic Adapta   S/P CABG x 5 09/13/2010   LIMA to LAD,SVG to obtuse marginal,seq. SVG to firts & second diagonals,SVG to forth posterolateral branch to RCA   Sore throat    Wheezing     Past Surgical History:  Procedure Laterality Date   CABG x5  09/13/2010   Dr Farrel Conners to LAD,SVG to obtuse marginal,seq. SVG to first & second diagonals,SVG to fourth posterolateral branch RCA   CARDIAC CATHETERIZATION  09/10/2010   severe LAD,D1,D2 & ostial D2   CARDIAC CATHETERIZATION N/A 08/28/2015   Procedure: Right/Left Heart Cath and Coronary/Graft Angiography;   Surgeon: Troy Sine, MD;  Location: [facility];  Service: Cardiovascular;  Laterality: N/A;   CHOLECYSTECTOMY  october 2012   CORONARY ARTERY BYPASS GRAFT     INSERT / REPLACE / REMOVE PACEMAKER  05/22/2012    dual chamber   PERMANENT PACEMAKER INSERTION N/A 05/22/2012   Procedure: PERMANENT PACEMAKER INSERTION;  Surgeon: Sanda Klein, MD;  Location: [facility];  Service: Cardiovascular;  Laterality: N/A;   repair of left indirect inguinal hernia with mesh.     repair of umbilical hernia.     TOTAL KNEE ARTHROPLASTY Right 09/26/2016   Procedure: TOTAL KNEE ARTHROPLASTY;  Surgeon: Frederik Pear, MD;  Location: [facility];  Service: Orthopedics;  Laterality: Right;    Current Medications: Current Meds  Medication Sig   apixaban (ELIQUIS) 5 MG TABS tablet Take 1 tablet (5 mg total) by mouth 2 (two) times daily.   Cholecalciferol (VITAMIN D3) 125 MCG (5000 UT) TABS Take 5,000 Units by mouth daily.   Cyanocobalamin (B-12) 1000 MCG/ML KIT Inject 1 Dose as directed every 30 (thirty) days.    ferrous sulfate 325 (65 FE) MG tablet Take 325 mg by mouth daily with breakfast.   fluticasone furoate-vilanterol (BREO ELLIPTA) 200-25 MCG/INH AEPB Inhale 1 puff into the lungs 2 (two) times daily.   furosemide (LASIX) 40 MG tablet Take 2 tablets (80 mg total) by mouth daily.   LANTUS SOLOSTAR 100 UNIT/ML Solostar Pen Inject 10 Units into the skin at bedtime.   levothyroxine (SYNTHROID) 75 MCG tablet Take 75 mcg by mouth daily.   metoprolol succinate (TOPROL-XL) 50 MG 24 hr tablet Take 1 tablet (50 mg total) by mouth daily. Take with or immediately following a meal.   ONETOUCH VERIO test strip daily. As directed   potassium chloride SA (KLOR-CON) 20 MEQ tablet TAKE 1 TABLET(20 MEQ) BY MOUTH DAILY   rosuvastatin (CRESTOR) 40 MG tablet Take 40 mg by mouth daily.   traZODone (DESYREL) 50 MG tablet Take 50 mg by mouth at bedtime as needed for sleep.    WIXELA INHUB 250-50 MCG/DOSE AEPB Inhale 1 puff into  the lungs 2 (two) times a day.   [DISCONTINUED] metoprolol tartrate (LOPRESSOR) 25 MG tablet TAKE 1 TABLET BY MOUTH  TWICE DAILY     Allergies:   No known allergies   Social History   Socioeconomic History   Marital status: Widowed    Spouse name: Not on file   Number of children: 2   Years of education: Not on file   Highest education level: Not on file  Occupational History   Not on file  Tobacco Use   Smoking status: Former    Years: 40.00    Types: Cigarettes    Quit date: 08/06/2010    Years since quitting: 10.2   Smokeless tobacco: Never  Substance and Sexual Activity   Alcohol use: Yes  Comment: 5-6 beers per week   Drug use: Yes    Types: Marijuana    Comment: couple times a month   Sexual activity: Not on file  Other Topics Concern   Not on file  Social History Narrative   Not on file   Social Determinants of Health   Financial Resource Strain: Not on file  Food Insecurity: Not on file  Transportation Needs: Not on file  Physical Activity: Not on file  Stress: Not on file  Social Connections: Not on file     Family History: The patient's family history includes Heart disease in his brother, father, and maternal uncle.  ROS:   Please see the history of present illness.     EKGs/Labs/Other Studies Reviewed:    The following studies were reviewed today:  Right/Left Cardiac Catheterization 08/28/2015: Ost LAD lesion, 60% stenosed. Ost LAD to Prox LAD lesion, 70% stenosed. SVG is moderate in size. The graft exhibits minimal luminal irregularities. SVG . Origin lesion, 40% stenosed. Prox Cx lesion, 100% stenosed. SVG . Prox Graft lesion, before 1st Diag, 30% stenosed. The SVG supplying the distal RCA had mild luminal irregularity but was free of significant stenosis. The SVG supplying the distal marginal vessel was patent. There appeared to be at 30-40% narrowing proximally in the region of the valve. The sequential vein graft supplying the first  and second diagonal vessel was patent. There was a 30% proximal area of narrowing which appeared to be region of the valve. LIMA is normal in caliber, and is anatomically normal. The LIMA graft was widely patent and anastomosed into the mid LAD. There is mild left ventricular systolic dysfunction. Ost LM lesion, 20% stenosed.     Mild global LV dysfunction with an ejection fraction of 45-50%.  There is significant mitral annular calcification and trace mitral regurgitation.   Mild pulmonary hypertension.   Significant native CAD with 20% mid left main narrowing; diffuse 60-70% proximal and 70% mid LAD stenoses; total occlusion of the proximal circumflex coronary artery with faint antegrade collaterals; and diffusely irregular RCA with mild calcification without apparent high-grade stenosis.   Patent LIMA graft supplying the mid LAD.   Patent sequential vein graft supplying the diagonal 1 and diagonal 2 vessel with mild 30% narrowing in the region of a valve in the proximal portion of the graft.   Patent vein graft supplying the distal circumflex marginal vessel with smooth 40% narrowing proximally in the region of a valve.   Patent vein graft supplying the distal posterolateral branch of the RCA.   Recommendations: Medical therapy. _______________  Echocardiogram 03/29/2018: Study Conclusions: - Left ventricle: The cavity size was normal. There was moderate    concentric hypertrophy. Systolic function was mildly to    moderately reduced. The estimated ejection fraction was in the    range of 40% to 45%. Features are consistent with a pseudonormal    left ventricular filling pattern, with concomitant abnormal    relaxation and increased filling pressure (grade 2 diastolic    dysfunction).  - Ventricular septum: Septal motion showed paradox. These changes    are consistent with right ventricular pacing.  - Mitral valve: Calcified annulus.  - Left atrium: The atrium was severely  dilated.  - Right ventricle: Systolic function was moderately reduced.  - Right atrium: The atrium was moderately dilated.  - Pulmonary arteries: Systolic pressure was mildly increased. PA    peak pressure: 35 mm Hg (S).   EKG:  EKG not ordered today.  Recent Labs: 08/06/2020: BUN 21; Creatinine, Ser 1.40; Potassium 4.0; Sodium 141  Recent Lipid Panel    Component Value Date/Time   CHOL 176 09/10/2010 0623   TRIG 94 09/10/2010 0623   HDL 50 09/10/2010 0623   CHOLHDL 3.5 09/10/2010 0623   VLDL 19 09/10/2010 0623   LDLCALC 107 (H) 09/10/2010 0623    Physical Exam:    Vital Signs: BP 134/70   Pulse 79   Ht _0  (1.803 m)   Wt 222 lb 3.2 oz (100.8 kg)   SpO2 98%   BMI 30.99 kg/m     Wt Readings from Last 3 Encounters:  10/30/20 222 lb 3.2 oz (100.8 kg)  08/06/20 224 lb 3.2 oz (101.7 kg)  11/13/19 211 lb 3.2 oz (95.8 kg)     General: 87 y.o. male in no acute distress. HEENT: Normocephalic and atraumatic. Sclera clear. EOMs intact. Neck: Supple. No carotid bruits. Positive hepatojugular reflux.  Heart: Irregularly irregular rhythm. Distinct S1 and S2. No murmurs, gallops, or rubs. Radial pulses 2+ and equal bilaterally. Lungs: No increased work of breathing. Initially rhonchi were noted in the bases but cleared with coughing. Faint crackles noted in bilateral bases after coughing. Abdomen: Soft, non-distended, and non-tender to palpation. Extremities: Trace to 1+ bilateral lower extremity (right chronically larger than right). Skin: Warm and dry. Neuro: Alert and oriented x3. No focal deficits. Psych: Normal affect. Responds appropriately.  Assessment:    1. Chronic combined systolic and diastolic CHF (congestive heart failure) ([place])   2. Coronary artery disease involving native coronary artery of native heart without angina pectoris   3. S/P CABG x 5   4. Longstanding persistent atrial fibrillation ([place])   5. Atrial flutter, unspecified type ([place])   6. CHB (complete  heart block) (HCC)   7. S/P placement of cardiac pacemaker   8. Primary hypertension   9. Hyperlipidemia, unspecified hyperlipidemia type   10. Type 2 diabetes mellitus with complication, with long-term current use of insulin ([place])   11. Medication management     Plan:    Chronic Combined CHF - Patient has chronic dyspnea but this has been waxing and waning over the last few weeks. Had URI symptoms about 3 weeks ago and still has a productive cough (tested negative for COVID). He does appear mildly volume overloaded on exam. He does have COPD so I feel this is contributing to dyspnea as well. - LVEF as low as 30-35% in 2017. Last Echo is 2020 showed VEF of 40-45% with grade 2 diastolic dysfunction as well as moderately reduced RV function. - Will check BNP and BMET today. Will update Echo to reassess EF. Will also order chest x-ray given continued productive cough and recent URI. - Continue Lasix 43m daily for now. May increase this depending on BNP and chest x-ray results. - Currently on Lopressor 246mtwice daily.  Will stop and transition to Toprol-XL 5022maily.  - Continue Hydralazine 53m62mree times daily. - Has not been on ACEi/ARB/ARNI due to issues with renal function in the past.  - Discussed importance of daily weight and sodium/fluid restrictions.   CAD s/p CABG x5 in 09/2010 - Last cardiac catheterization in 2017 showed patent grafts.  - No angina.  - Not on aspirin due to need for DOAC.  - Continue beta-blocker and high-intensity statin.   Persistent Atrial Fibrillation Paroxysmal Atrial Flutter - Previously on Amiodarone but discontinued in 2021 with plan to just focus on rate control. - Rate controlled.  -  Continue beta-blocker as above.  - Continue chronic anti-coagulation with Eliquis 64m twice daily.  - Will check CBC today.  Complete Heart Block s/p Medtronic Dual-Chamber PPM - Pacemaker dependent.  - Per Dr. CVictorino Decemberlast note, may be a candidate for CRT  upgrade if LVEF declines further.  Hypertension - BP well controlled.  - Continue medications for CHF as above.  Hyperlipidemia - Lipid panel from 04/2020: Total Cholesterol 120, Triglycerides 129, HDL 44, LDL 54.  - LDL goal <70 given CAD.  - Continue Crestor 461mdaily.  Type 2 Diabetes Mellitus - Management per PCP.  CKD Stage III - Baseline creatinine 1.4 to 1.6.  - Will repeat BMET today.  Disposition: Patient already has follow-up with Dr. CrSallyanne Kustercheduled next month.    Medication Adjustments/Labs and Tests Ordered: Current medicines are reviewed at length with the patient today.  Concerns regarding medicines are outlined above.  Orders Placed This Encounter  Procedures   DG Chest 2 View   Brain natriuretic peptide   Basic metabolic panel   CBC   ECHOCARDIOGRAM COMPLETE   Meds ordered this encounter  Medications   metoprolol succinate (TOPROL-XL) 50 MG 24 hr tablet    Sig: Take 1 tablet (50 mg total) by mouth daily. Take with or immediately following a meal.    Dispense:  90 tablet    Refill:  3    Patient Instructions  Medication Instructions:  STOP METOPROLOL TARTRATE  START METOPROLOL SUCCINATE 50MG DAILY TOMORROW  *If you need a refill on your cardiac medications before your next appointment, please call your pharmacy*  Lab Work:    BNP,BMET AND CBC TODAY      Special Instructions CHEST XRAY AT [place]  Echocardiogram - Your physician has requested that you have an echocardiogram. Echocardiography is a painless test that uses sound waves to create images of your heart. It provides your doctor with information about the size and shape of your heart and how well your heart's chambers and valves are working. This procedure takes approximately one hour. There are no restrictions for this procedure. [facility] [id number]  Follow-Up: Your next appointment:  AFTER ECHO   In Person with MiSanda KleinMD ORDelhiPA-C  [facility].  As part of our continuing mission to provide you with exceptional heart care, we have created designated Provider Care Teams.  These Care Teams include your primary Cardiologist (physician) and Advanced Practice Providers (APPs -  Physician Assistants and Nurse Practitioners) who all work together to provide you with the care you need, when you need it.   Signed, CaDarreld McleanPA-C  10/30/2020 5:00 PM    [place]

## 2020-10-30 ENCOUNTER — Ambulatory Visit: Payer: Medicare Other | Admitting: Student

## 2020-10-30 ENCOUNTER — Other Ambulatory Visit: Payer: Self-pay

## 2020-10-30 ENCOUNTER — Encounter: Payer: Self-pay | Admitting: Student

## 2020-10-30 VITALS — BP 134/70 | HR 79 | Ht 71.0 in | Wt 222.2 lb

## 2020-10-30 DIAGNOSIS — I4811 Longstanding persistent atrial fibrillation: Secondary | ICD-10-CM | POA: Diagnosis not present

## 2020-10-30 DIAGNOSIS — Z951 Presence of aortocoronary bypass graft: Secondary | ICD-10-CM | POA: Diagnosis not present

## 2020-10-30 DIAGNOSIS — I4892 Unspecified atrial flutter: Secondary | ICD-10-CM

## 2020-10-30 DIAGNOSIS — Z794 Long term (current) use of insulin: Secondary | ICD-10-CM

## 2020-10-30 DIAGNOSIS — E118 Type 2 diabetes mellitus with unspecified complications: Secondary | ICD-10-CM | POA: Diagnosis not present

## 2020-10-30 DIAGNOSIS — Z950 Presence of cardiac pacemaker: Secondary | ICD-10-CM | POA: Diagnosis not present

## 2020-10-30 DIAGNOSIS — I2510 Atherosclerotic heart disease of native coronary artery without angina pectoris: Secondary | ICD-10-CM | POA: Diagnosis not present

## 2020-10-30 DIAGNOSIS — I5042 Chronic combined systolic (congestive) and diastolic (congestive) heart failure: Secondary | ICD-10-CM

## 2020-10-30 DIAGNOSIS — E785 Hyperlipidemia, unspecified: Secondary | ICD-10-CM

## 2020-10-30 DIAGNOSIS — I442 Atrioventricular block, complete: Secondary | ICD-10-CM | POA: Diagnosis not present

## 2020-10-30 DIAGNOSIS — I10 Essential (primary) hypertension: Secondary | ICD-10-CM

## 2020-10-30 DIAGNOSIS — Z79899 Other long term (current) drug therapy: Secondary | ICD-10-CM | POA: Diagnosis not present

## 2020-10-30 DIAGNOSIS — E538 Deficiency of other specified B group vitamins: Secondary | ICD-10-CM | POA: Diagnosis not present

## 2020-10-30 MED ORDER — METOPROLOL SUCCINATE ER 50 MG PO TB24: 50.0000 mg | ORAL_TABLET | Freq: Every day | ORAL | 3 refills | Status: DC

## 2020-10-30 NOTE — Patient Instructions (Signed)
Medication Instructions:  STOP METOPROLOL TARTRATE  START METOPROLOL SUCCINATE '50MG'$  DAILY TOMORROW  *If you need a refill on your cardiac medications before your next appointment, please call your pharmacy*  Lab Work:    BNP,BMET AND CBC TODAY      Special Instructions CHEST XRAY AT [place] HANDOUT  Echocardiogram - Your physician has requested that you have an echocardiogram. Echocardiography is a painless test that uses sound waves to create images of your heart. It provides your doctor with information about the size and shape of your heart and how well your heart's chambers and valves are working. This procedure takes approximately one hour. There are no restrictions for this procedure. This will be performed at our [facility] location - [address], [address].  Follow-Up: Your next appointment:  AFTER ECHO  In Person with Sanda Klein, MD [place], PA-C  At [facility], you and your health needs are our priority.  As part of our continuing mission to provide you with exceptional heart care, we have created designated Provider Care Teams.  These Care Teams include your primary Cardiologist (physician) and Advanced Practice Providers (APPs -  Physician Assistants and Nurse Practitioners) who all work together to provide you with the care you need, when you need it.

## 2020-10-31 LAB — BASIC METABOLIC PANEL
BUN/Creatinine Ratio: 11 (ref 10–24)
BUN: 16 mg/dL (ref 8–27)
CO2: 26 mmol/L (ref 20–29)
Calcium: 9.2 mg/dL (ref 8.6–10.2)
Chloride: 98 mmol/L (ref 96–106)
Creatinine, Ser: 1.44 mg/dL — ABNORMAL HIGH (ref 0.76–1.27)
Glucose: 224 mg/dL — ABNORMAL HIGH (ref 65–99)
Potassium: 4 mmol/L (ref 3.5–5.2)
Sodium: 140 mmol/L (ref 134–144)
eGFR: 48 mL/min/{1.73_m2} — ABNORMAL LOW (ref >59)

## 2020-10-31 LAB — CBC
Hematocrit: 33.4 % — ABNORMAL LOW (ref 37.5–51.0)
Hemoglobin: 10 g/dL — ABNORMAL LOW (ref 13.0–17.7)
MCH: 24 pg — ABNORMAL LOW (ref 26.6–33.0)
MCHC: 29.9 g/dL — ABNORMAL LOW (ref 31.5–35.7)
MCV: 80 fL (ref 79–97)
Platelets: 270 10*3/uL (ref 150–450)
RBC: 4.17 x10E6/uL (ref 4.14–5.80)
RDW: 17.4 % — ABNORMAL HIGH (ref 11.6–15.4)
WBC: 7.6 10*3/uL (ref 3.4–10.8)

## 2020-10-31 LAB — BRAIN NATRIURETIC PEPTIDE: BNP: 293.4 pg/mL — ABNORMAL HIGH (ref 0.0–100.0)

## 2020-11-02 ENCOUNTER — Ambulatory Visit
Admission: RE | Admit: 2020-11-02 | Discharge: 2020-11-02 | Disposition: A | Discharge status: Home / Self Care | Payer: Medicare Other | Source: Ambulatory Visit | Attending: Student | Admitting: Student

## 2020-11-02 ENCOUNTER — Other Ambulatory Visit: Payer: Self-pay

## 2020-11-02 DIAGNOSIS — R059 Cough, unspecified: Secondary | ICD-10-CM | POA: Diagnosis not present

## 2020-11-02 DIAGNOSIS — I442 Atrioventricular block, complete: Secondary | ICD-10-CM

## 2020-11-02 DIAGNOSIS — Z951 Presence of aortocoronary bypass graft: Secondary | ICD-10-CM

## 2020-11-02 DIAGNOSIS — I10 Essential (primary) hypertension: Secondary | ICD-10-CM

## 2020-11-02 DIAGNOSIS — Z79899 Other long term (current) drug therapy: Secondary | ICD-10-CM

## 2020-11-02 DIAGNOSIS — E785 Hyperlipidemia, unspecified: Secondary | ICD-10-CM

## 2020-11-02 DIAGNOSIS — I5042 Chronic combined systolic (congestive) and diastolic (congestive) heart failure: Secondary | ICD-10-CM

## 2020-11-02 DIAGNOSIS — Z950 Presence of cardiac pacemaker: Secondary | ICD-10-CM

## 2020-11-02 DIAGNOSIS — I4811 Longstanding persistent atrial fibrillation: Secondary | ICD-10-CM

## 2020-11-02 DIAGNOSIS — E118 Type 2 diabetes mellitus with unspecified complications: Secondary | ICD-10-CM

## 2020-11-02 DIAGNOSIS — I4892 Unspecified atrial flutter: Secondary | ICD-10-CM

## 2020-11-02 DIAGNOSIS — I2510 Atherosclerotic heart disease of native coronary artery without angina pectoris: Secondary | ICD-10-CM

## 2020-11-03 ENCOUNTER — Other Ambulatory Visit: Payer: Self-pay

## 2020-11-03 ENCOUNTER — Telehealth: Payer: Self-pay | Admitting: Cardiovascular Disease

## 2020-11-03 NOTE — Telephone Encounter (Signed)
Spoke with wendy, aware they are the same medications and the succ does help control the heart rate better because it is a constant release of medication for 24 hours. They will go ahead and start the medication and discuss with dr croitoru at their follow up appointment. They will call once the medication is started if he has problems.

## 2020-11-03 NOTE — Telephone Encounter (Signed)
Pt c/o medication issue:  1. Name of Medication: metoprolol succinate (TOPROL-XL) 50 MG 24 hr tablet  2. How are you currently taking this medication (dosage and times per day)? Patient has not started taking yet   3. Are you having a reaction (difficulty breathing--STAT)? no  4. What is your medication issue? Patient saw the PA [facility] on Friday 10/30/20. She wanted to switch the patient from  two short acting metoprolol daily to one long acting tablet. The patient wanted to get Dr. Victorino December advice before he started taking the medication

## 2020-11-04 ENCOUNTER — Telehealth: Payer: Self-pay

## 2020-11-04 ENCOUNTER — Other Ambulatory Visit: Payer: Self-pay

## 2020-11-04 MED ORDER — FUROSEMIDE 40 MG PO TABS
80.0000 mg | ORAL_TABLET | Freq: Every day | ORAL | 3 refills | Status: DC
Start: 2020-11-04 — End: 2021-06-08

## 2020-11-04 NOTE — Telephone Encounter (Signed)
Darreld Mclean, PA-C  11/03/2020 11:27 AM EDT     Please call and notify patient of results: Chest x-ray showed an opacity in the the left lung base which could represent atelectasis (which is a small collapse of the alveoli or airsacs) vs infiltrate (possible pneumonia). No edema noted in his lungs which is more suggestive of CHF. Patient's WBC was normal and he had no fever but he has had a persistent cough. I would recommend following up with PCP in case he feels like an antibiotic is needed. Given mildly elevated BNP, let's also increase his Lasix to '80mg'$  twice daily for 3 days and then back down to '80mg'$  once daily with instructions that after this he can take an extra dose of Lasix '80mg'$  daily as needed for weight gain (3lbs in 1 day or 5lbs in 1 weeks). Patient currently takes potassium chloride 20 mEq once daily. He should take an extra dose of this for the 3 days while on Lasix twice daily and after this if he requires an additional dose of Lasix.   Thank you!  Lm2cb hm and cell number

## 2020-11-10 NOTE — Telephone Encounter (Signed)
Called pt he states that his swelling is doing wood and he does not need to take an extra lasix/K+ as his weight is down 2#. He will increase and call if his weight is Jordan lb/day or 5#/weekly.

## 2020-11-18 DIAGNOSIS — R059 Cough, unspecified: Secondary | ICD-10-CM | POA: Diagnosis not present

## 2020-11-20 ENCOUNTER — Ambulatory Visit (INDEPENDENT_AMBULATORY_CARE_PROVIDER_SITE_OTHER): Payer: Medicare Other

## 2020-11-20 DIAGNOSIS — I442 Atrioventricular block, complete: Secondary | ICD-10-CM

## 2020-11-23 ENCOUNTER — Ambulatory Visit (HOSPITAL_COMMUNITY): Payer: Medicare Other | Attending: Cardiology

## 2020-11-23 ENCOUNTER — Other Ambulatory Visit: Payer: Self-pay

## 2020-11-23 DIAGNOSIS — I10 Essential (primary) hypertension: Secondary | ICD-10-CM

## 2020-11-23 DIAGNOSIS — I5042 Chronic combined systolic (congestive) and diastolic (congestive) heart failure: Secondary | ICD-10-CM | POA: Diagnosis not present

## 2020-11-23 DIAGNOSIS — E785 Hyperlipidemia, unspecified: Secondary | ICD-10-CM | POA: Diagnosis not present

## 2020-11-23 DIAGNOSIS — Z794 Long term (current) use of insulin: Secondary | ICD-10-CM | POA: Diagnosis not present

## 2020-11-23 DIAGNOSIS — Z951 Presence of aortocoronary bypass graft: Secondary | ICD-10-CM

## 2020-11-23 DIAGNOSIS — I4811 Longstanding persistent atrial fibrillation: Secondary | ICD-10-CM | POA: Diagnosis not present

## 2020-11-23 DIAGNOSIS — I442 Atrioventricular block, complete: Secondary | ICD-10-CM | POA: Diagnosis not present

## 2020-11-23 DIAGNOSIS — I2510 Atherosclerotic heart disease of native coronary artery without angina pectoris: Secondary | ICD-10-CM

## 2020-11-23 DIAGNOSIS — E118 Type 2 diabetes mellitus with unspecified complications: Secondary | ICD-10-CM

## 2020-11-23 DIAGNOSIS — Z79899 Other long term (current) drug therapy: Secondary | ICD-10-CM

## 2020-11-23 DIAGNOSIS — I4892 Unspecified atrial flutter: Secondary | ICD-10-CM

## 2020-11-23 DIAGNOSIS — Z950 Presence of cardiac pacemaker: Secondary | ICD-10-CM

## 2020-11-23 LAB — CUP PACEART REMOTE DEVICE CHECK
Battery Impedance: 1437 Ohm
Battery Remaining Longevity: 51 mo
Battery Voltage: 2.77 V
Brady Statistic RV Percent Paced: 98 %
Date Time Interrogation Session: 20220916153936
Implantable Lead Implant Date: 20140318
Implantable Lead Implant Date: 20140318
Implantable Lead Location: 753859
Implantable Lead Location: 753860
Implantable Lead Model: 5076
Implantable Lead Model: 5076
Implantable Pulse Generator Implant Date: 20140318
Lead Channel Impedance Value: 67 Ohm
Lead Channel Impedance Value: 672 Ohm
Lead Channel Pacing Threshold Amplitude: 1 V
Lead Channel Pacing Threshold Pulse Width: 0.4 ms
Lead Channel Setting Pacing Amplitude: 2.5 V
Lead Channel Setting Pacing Pulse Width: 0.4 ms
Lead Channel Setting Sensing Sensitivity: 5.6 mV

## 2020-11-23 LAB — ECHOCARDIOGRAM COMPLETE
Area-P 1/2: 4.63 cm2
S' Lateral: 3.6 cm

## 2020-11-25 NOTE — Progress Notes (Signed)
Remote pacemaker transmission.   

## 2020-11-27 ENCOUNTER — Other Ambulatory Visit: Payer: Self-pay

## 2020-11-27 DIAGNOSIS — Z79899 Other long term (current) drug therapy: Secondary | ICD-10-CM

## 2020-11-27 DIAGNOSIS — I77810 Thoracic aortic ectasia: Secondary | ICD-10-CM

## 2020-11-27 DIAGNOSIS — N184 Chronic kidney disease, stage 4 (severe): Secondary | ICD-10-CM

## 2020-11-27 DIAGNOSIS — J449 Chronic obstructive pulmonary disease, unspecified: Secondary | ICD-10-CM | POA: Diagnosis not present

## 2020-11-30 ENCOUNTER — Encounter: Payer: Medicare Other | Admitting: Cardiovascular Disease

## 2020-12-04 DIAGNOSIS — E1121 Type 2 diabetes mellitus with diabetic nephropathy: Secondary | ICD-10-CM | POA: Diagnosis not present

## 2020-12-04 DIAGNOSIS — E1169 Type 2 diabetes mellitus with other specified complication: Secondary | ICD-10-CM | POA: Diagnosis not present

## 2020-12-04 DIAGNOSIS — N1832 Chronic kidney disease, stage 3b: Secondary | ICD-10-CM | POA: Diagnosis not present

## 2020-12-04 DIAGNOSIS — E119 Type 2 diabetes mellitus without complications: Secondary | ICD-10-CM | POA: Diagnosis not present

## 2020-12-04 DIAGNOSIS — J449 Chronic obstructive pulmonary disease, unspecified: Secondary | ICD-10-CM | POA: Diagnosis not present

## 2020-12-04 DIAGNOSIS — G4700 Insomnia, unspecified: Secondary | ICD-10-CM | POA: Diagnosis not present

## 2020-12-04 DIAGNOSIS — I10 Essential (primary) hypertension: Secondary | ICD-10-CM | POA: Diagnosis not present

## 2020-12-04 DIAGNOSIS — E785 Hyperlipidemia, unspecified: Secondary | ICD-10-CM | POA: Diagnosis not present

## 2020-12-04 DIAGNOSIS — I509 Heart failure, unspecified: Secondary | ICD-10-CM | POA: Diagnosis not present

## 2020-12-04 DIAGNOSIS — E039 Hypothyroidism, unspecified: Secondary | ICD-10-CM | POA: Diagnosis not present

## 2020-12-10 ENCOUNTER — Other Ambulatory Visit: Payer: Self-pay

## 2020-12-10 DIAGNOSIS — Z79899 Other long term (current) drug therapy: Secondary | ICD-10-CM

## 2020-12-10 DIAGNOSIS — N184 Chronic kidney disease, stage 4 (severe): Secondary | ICD-10-CM

## 2020-12-10 DIAGNOSIS — I77810 Thoracic aortic ectasia: Secondary | ICD-10-CM

## 2020-12-21 ENCOUNTER — Ambulatory Visit (INDEPENDENT_AMBULATORY_CARE_PROVIDER_SITE_OTHER): Payer: Medicare Other | Admitting: Cardiovascular Disease

## 2020-12-21 ENCOUNTER — Other Ambulatory Visit: Payer: Self-pay

## 2020-12-21 ENCOUNTER — Encounter: Payer: Self-pay | Admitting: Cardiovascular Disease

## 2020-12-21 ENCOUNTER — Other Ambulatory Visit: Payer: Self-pay | Admitting: Student

## 2020-12-21 VITALS — BP 142/80 | HR 79 | Ht 71.0 in | Wt 224.2 lb

## 2020-12-21 DIAGNOSIS — E118 Type 2 diabetes mellitus with unspecified complications: Secondary | ICD-10-CM

## 2020-12-21 DIAGNOSIS — Z794 Long term (current) use of insulin: Secondary | ICD-10-CM

## 2020-12-21 DIAGNOSIS — I25810 Atherosclerosis of coronary artery bypass graft(s) without angina pectoris: Secondary | ICD-10-CM | POA: Diagnosis not present

## 2020-12-21 DIAGNOSIS — I4811 Longstanding persistent atrial fibrillation: Secondary | ICD-10-CM

## 2020-12-21 DIAGNOSIS — N1832 Chronic kidney disease, stage 3b: Secondary | ICD-10-CM | POA: Diagnosis not present

## 2020-12-21 DIAGNOSIS — I10 Essential (primary) hypertension: Secondary | ICD-10-CM | POA: Diagnosis not present

## 2020-12-21 DIAGNOSIS — Z950 Presence of cardiac pacemaker: Secondary | ICD-10-CM | POA: Diagnosis not present

## 2020-12-21 DIAGNOSIS — I5042 Chronic combined systolic (congestive) and diastolic (congestive) heart failure: Secondary | ICD-10-CM

## 2020-12-21 DIAGNOSIS — I5022 Chronic systolic (congestive) heart failure: Secondary | ICD-10-CM

## 2020-12-21 DIAGNOSIS — J449 Chronic obstructive pulmonary disease, unspecified: Secondary | ICD-10-CM | POA: Diagnosis not present

## 2020-12-21 DIAGNOSIS — E785 Hyperlipidemia, unspecified: Secondary | ICD-10-CM | POA: Diagnosis not present

## 2020-12-21 DIAGNOSIS — I442 Atrioventricular block, complete: Secondary | ICD-10-CM | POA: Diagnosis not present

## 2020-12-21 DIAGNOSIS — N184 Chronic kidney disease, stage 4 (severe): Secondary | ICD-10-CM | POA: Diagnosis not present

## 2020-12-21 DIAGNOSIS — Z79899 Other long term (current) drug therapy: Secondary | ICD-10-CM | POA: Diagnosis not present

## 2020-12-21 DIAGNOSIS — I77810 Thoracic aortic ectasia: Secondary | ICD-10-CM | POA: Diagnosis not present

## 2020-12-21 MED ORDER — LOSARTAN POTASSIUM 25 MG PO TABS: 25.0000 mg | ORAL_TABLET | Freq: Every day | ORAL | 3 refills | Status: DC

## 2020-12-21 MED ORDER — LOSARTAN POTASSIUM 25 MG PO TABS: 25.0000 mg | ORAL_TABLET | Freq: Every day | ORAL | 0 refills | Status: DC

## 2020-12-21 NOTE — Progress Notes (Signed)
Losartan 

## 2020-12-21 NOTE — Patient Instructions (Signed)

## 2020-12-21 NOTE — Progress Notes (Signed)
Patient ID: Jesus Oconnor, male   DOB: 02/28/1934, 87 y.o.   MRN: [id number]     Cardiology Office Note    Date:  12/22/2020   ID:  Jesus Oconnor, DOB [birth year], MRN [id number]  PCP:  Vernie Shanks, MD  Cardiologist:   Sanda Klein, MD   Chief Complaint  Patient presents with   Follow-up    CHF, CAD, AV block, pacemaker     History of Present Illness:  Jesus Oconnor is a 87 y.o. 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  He has been in persistent atrial fibrillation since January 2021 and the device is programmed VVIR. He is asymptomatic and compliant with anticoagulation.  The patient specifically denies any chest pain at rest exertion, dyspnea at rest or with exertion, orthopnea, paroxysmal nocturnal dyspnea, syncope, palpitations, focal neurological deficits, intermittent claudication, lower extremity edema, unexplained weight gain, cough, hemoptysis or wheezing. Activity is limited by knee pain. He denies falls and bleeding.  Device function is normal, 98% V pacing, but he does have an underlying escape (probably idioventricular rhythm) at approx 37 bpm. The heart rate histograms are appropriate and he has not had any high V rates since May.  He had right total knee replacement for severe arthritis.  This is also the site of his saphenous vein harvest for his previous bypass. The right calf often swells a little more than the left.    In 2017, he developed edema and shortness of breath and echocardiography showed an EF had dropped to 30-35 %. On 08/28/2015 he underwent right and left heart catheterization. Estimated LVEF was better at 45-50 %. He had severe native coronary disease,  but all his bypasses (LIMA to LAD, SVG sequential diagonal 1-diagonal 2, SVG to distal circumflex, SVG to PLA branch of RCA) were widely patent. Medical therapy was recommended. Past Medical History:  Diagnosis Date   Anemia    history of   Arthritis    Atrial flutter (HCC)    severe bradycardia   CHF (congestive heart failure) (HCC)    Constipation    COPD (chronic obstructive pulmonary disease) (HCC)    Coronary artery disease    Cough    Diarrhea    Diastolic dysfunction    Echo 05/07/6120-ESLPN I diastolic dysfunction,EF 30-05%   DM2 (diabetes mellitus, type 2) (HCC)    Dyslipidemia    History of kidney stones    HTN (hypertension)    Hyperlipidemia    Leg swelling    Mobitz type 1 second degree atrioventricular block    Pacemaker 05/22/2012   Dr  Deshondra Worst    Medtronic Adapta   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 SVG to firts & second diagonals,SVG to forth posterolateral branch to RCA   Sore throat    Wheezing     Past Surgical History:  Procedure Laterality Date   CABG x5  09/13/2010   Dr Farrel Conners to LAD,SVG to obtuse marginal,seq. SVG to first & second diagonals,SVG to fourth posterolateral branch RCA   CARDIAC CATHETERIZATION  09/10/2010   severe LAD,D1,D2 & ostial D2   CARDIAC CATHETERIZATION N/A 08/28/2015   Procedure: Right/Left Heart Cath and Coronary/Graft Angiography;  Surgeon: Troy Sine, MD;  Location: [facility];  Service: Cardiovascular;  Laterality: N/A;   CHOLECYSTECTOMY  october 2012   CORONARY ARTERY BYPASS GRAFT     INSERT / REPLACE / REMOVE PACEMAKER  05/22/2012    dual chamber   PERMANENT PACEMAKER INSERTION N/A 05/22/2012   Procedure: PERMANENT PACEMAKER INSERTION;  Surgeon: Sanda Klein, MD;  Location: [facility];  Service: Cardiovascular;  Laterality: N/A;   repair of left indirect inguinal hernia with mesh.     repair of umbilical hernia.     TOTAL KNEE ARTHROPLASTY Right 09/26/2016   Procedure: TOTAL KNEE  ARTHROPLASTY;  Surgeon: Frederik Pear, MD;  Location: [facility];  Service: Orthopedics;  Laterality: Right;    Current Medications: Outpatient Medications Prior to Visit  Medication Sig Dispense Refill   apixaban (ELIQUIS) 5 MG TABS tablet Take 1 tablet (5 mg total) by mouth 2 (two) times daily. 180 tablet 3   Cyanocobalamin (B-12) 1000 MCG/ML KIT Inject 1 Dose as directed every 30 (thirty) days.      furosemide (LASIX) 40 MG tablet Take 2 tablets (80 mg total) by mouth daily. Ok for extra dose for wt gain >3# or 5# weekly 180 tablet 3   hydrALAZINE (APRESOLINE) 25 MG tablet Take 1 tablet (25 mg total) by mouth 3 (three) times daily for 30 days. 90 tablet 0   LANTUS SOLOSTAR 100 UNIT/ML Solostar Pen Inject 10 Units into the skin at bedtime.     levothyroxine (SYNTHROID) 75 MCG tablet Take 75 mcg by mouth daily.     metoprolol succinate (TOPROL-XL) 50 MG 24 hr tablet Take 1 tablet (50 mg total) by mouth daily. Take with or immediately following a meal. 90 tablet 3   ONETOUCH VERIO test strip daily. As directed  4   potassium chloride SA (KLOR-CON) 20 MEQ tablet TAKE 1 TABLET(20 MEQ) BY MOUTH DAILY 90 tablet 3   rosuvastatin (CRESTOR) 40 MG tablet Take 40 mg by mouth daily.     traZODone (DESYREL) 50 MG tablet Take 50 mg by mouth at bedtime as needed for sleep.      WIXELA INHUB 250-50 MCG/DOSE AEPB Inhale 1 puff into the lungs 2 (two) times a day.     Cholecalciferol (VITAMIN D3) 125 MCG (5000 UT) TABS Take 5,000 Units by mouth daily. (Patient not taking: Reported on 12/21/2020)     ferrous sulfate 325 (65 FE) MG tablet Take 325 mg by mouth daily with breakfast. (Patient not taking: Reported on 12/21/2020)     fluticasone furoate-vilanterol (BREO ELLIPTA) 200-25 MCG/INH AEPB Inhale 1 puff into the lungs 2 (two) times daily.     No facility-administered medications prior to visit.     Allergies:   No known allergies   Social History   Socioeconomic History   Marital status: Widowed    Spouse  name: Not on file   Number of children: 2   Years of education: Not on file   Highest education level: Not on file  Occupational History   Not on file  Tobacco Use   Smoking status: Former    Years: 40.00    Types: Cigarettes    Quit date: 08/06/2010    Years since quitting: 10.3   Smokeless tobacco: Never  Substance and Sexual Activity   Alcohol use: Yes    Comment: 5-6 beers per week   Drug use: Yes    Types: Marijuana    Comment: couple times a month   Sexual activity: Not on file  Other Topics Concern   Not on file  Social History Narrative   Not on file   Social Determinants of Health   Financial Resource Strain: Not on file  Food Insecurity: No Food Insecurity   Worried About Charity fundraiser in the Last Year: Never true   Ran Out of Food in the Last Year: Never true  Transportation Needs: No Transportation Needs   Lack of Transportation (Medical): No   Lack of Transportation (Non-Medical): No  Physical Activity: Not on file  Stress: Not on file  Social Connections: Not on file     Family History:  The patient's family history includes Heart disease in his brother, father, and maternal uncle.   ROS:   Please see the history of present illness.    ROS All other systems are reviewed and are negative.   PHYSICAL EXAM:   VS:  BP (!) 142/80 (BP Location: Left Arm, Patient Position: Sitting, Cuff Size: Normal)   Pulse 79   Ht _0  (1.803 m)   Wt 224 lb 3.2 oz (101.7 kg)   SpO2 98%   BMI 31.27 kg/m       General: Alert, oriented x3, no distress, healthy left subclavian pacemaker site Head: no evidence of trauma, PERRL, EOMI, no exophtalmos or lid lag, no myxedema, no xanthelasma; normal ears, nose and oropharynx Neck: normal jugular venous pulsations and no hepatojugular reflux; brisk carotid pulses without delay and no carotid bruits Chest: clear to auscultation, no signs of consolidation by percussion or palpation, normal fremitus, symmetrical and full  respiratory excursions Cardiovascular: normal position and quality of the apical impulse, regular rhythm, normal first and paradoxically split second heart sounds, no murmurs, rubs or gallops Abdomen: no tenderness or distention, no masses by palpation, no abnormal pulsatility or arterial bruits, normal bowel sounds, no hepatosplenomegaly Extremities: no clubbing, cyanosis or edema; 2+ radial, ulnar and brachial pulses bilaterally; 2+ right femoral, posterior tibial and dorsalis pedis pulses; 2+ left femoral, posterior tibial and dorsalis pedis pulses; no subclavian or femoral bruits Neurological: grossly nonfocal Psych: Normal mood and affect    Wt Readings from Last 3 Encounters:  12/21/20 224 lb 3.2 oz (101.7 kg)  10/30/20 222 lb 3.2 oz (100.8 kg)  08/06/20 224 lb 3.2 oz (101.7 kg)   Studies/Labs Reviewed:   EKG:  EKG is  ordered today.  The ekg ordered today demonstrates Afib w V paced rhythm  ECHO: 03/29/2018 - Left ventricle: The cavity size was normal. There was moderate    concentric hypertrophy. Systolic function was mildly to    moderately reduced. The estimated ejection fraction was in the    range of 40% to 45%. Features are consistent with a pseudonormal    left ventricular filling pattern, with concomitant abnormal    relaxation and increased filling pressure (grade 2 diastolic    dysfunction).  - Ventricular septum: Septal motion showed paradox. These changes    are consistent with right ventricular pacing.  - Mitral valve: Calcified annulus.  - Left atrium: The atrium was severely dilated.  - Right ventricle: Systolic function was moderately reduced.  - Right atrium: The atrium was moderately dilated.  - Pulmonary arteries: Systolic pressure was mildly increased. PA    peak pressure: 35 mm Hg (S).  Recent Labs: 10/30/2020: BNP 293.4; Hemoglobin 10.0; Platelets 270 12/21/2020: BUN 16; Creatinine, Ser 1.36; Potassium 4.4; Sodium 142   Lipid Panel Jan 2019 LDL  78 Lipid Panel     Component Value Date/Time   CHOL 176 09/10/2010 0623   TRIG  94 09/10/2010 0623   HDL 50 09/10/2010 0623   CHOLHDL 3.5 09/10/2010 0623   VLDL 19 09/10/2010 0623   LDLCALC 107 (H) 09/10/2010 4827    ASSESSMENT:    1. Longstanding persistent atrial fibrillation ([place])   2. Chronic combined systolic and diastolic CHF (congestive heart failure) ([place])   3. Coronary artery disease involving coronary bypass graft of native heart without angina pectoris   4. Essential hypertension   5. Chronic obstructive pulmonary disease, unspecified COPD type ([place])   6. CHB (complete heart block) (HCC)   7. S/P placement of cardiac pacemaker   8. Hyperlipidemia LDL goal <70   9. Type 2 diabetes mellitus with complication, with long-term current use of insulin ([place])   10. Stage 3b chronic kidney disease ([place])      PLAN:  In order of problems listed above:  Longstanding persistent atrial flutter/fibrillation: On anticoagulation. Asymptomatic.  CHA2DS2-VASc 6 (age 89, CAD, HTN, DM).  Continued attempts at return to sinus rhythm and use of potentially toxic drug such as amiodarone are not beneficial.  CHF:  Most recent LVEF by echocardiogram January 2020 40-45%, stable over time.  Appears clinically euvolemic.  NYHA functional class 1-2, limited by orthopedic complaints, not dyspnea .On metoprolol and hydralazine, not on RAAS inhibitors due to issues- with worsening renal function throughout 2020. CAD s/p CABG: Denies angina, has occluded native arteries, graft dependent (all grafts patent at the cardiac catheterization 2017). HTN: adequate control COPD: Had some problems with wheezing on carvedilol, tolerating metoprolol without problems. No wheezing recently. CHB: Pacemaker dependent. Today he does have an underlying escape rhythm (idioventricular 37 bpm), but this has not consistently been present.   PPM: Dual-chamber device reprogrammed VVIR. May be a candidate for CRT upgrade if CHF  worsens and LVEF declines further  in the future. Continue remote downloads every 3 months and yearly office visits. HLP: On rosuvastatin. LDL 54 earlier this year, continue statin. DM: Recent hemoglobin A1c improved at 7.8%. CKD 3b: Creatinine seems to be at baseline around 1.5, GFR around 40. Most recently creatinine was 1.44.   Medication Adjustments/Labs and Tests Ordered: Current medicines are reviewed at length with the patient today.  Concerns regarding medicines are outlined above.  Medication changes, Labs and Tests ordered today are listed in the Patient Instructions below. Patient Instructions  Medication Instructions:  No changes *If you need a refill on your cardiac medications before your next appointment, please call your pharmacy*   Lab Work: None ordered If you have labs (blood work) drawn today and your tests are completely normal, you will receive your results only by: [place] (if you have MyChart) OR A paper copy in the mail If you have any lab test that is abnormal or we need to change your treatment, we will call you to review the results.   Testing/Procedures: None ordered   Follow-Up: At [facility], you and your health needs are our priority.  As part of our continuing mission to provide you with exceptional heart care, we have created designated Provider Care Teams.  These Care Teams include your primary Cardiologist (physician) and Advanced Practice Providers (APPs -  Physician Assistants and Nurse Practitioners) who all work together to provide you with the care you need, when you need it.  We recommend signing up for the patient portal called "MyChart".  Sign up information is provided on this After Visit Summary.  MyChart is used to connect with patients for Virtual Visits (Telemedicine).  Patients are  able to view lab/test results, encounter notes, upcoming appointments, etc.  Non-urgent messages can be sent to your provider as well.   To learn  more about what you can do with MyChart, go to [email or web address].    Your next appointment:   12 month(s)  The format for your next appointment:   In Person  Provider:   Sanda Klein, MD    Signed, Sanda Klein, MD  12/22/2020 2:49 PM    [address], [place], [place]  [address] Phone: [phone]; Fax: [phone]

## 2020-12-22 LAB — BASIC METABOLIC PANEL
BUN/Creatinine Ratio: 12 (ref 10–24)
BUN: 16 mg/dL (ref 8–27)
CO2: 25 mmol/L (ref 20–29)
Calcium: 9.6 mg/dL (ref 8.6–10.2)
Chloride: 102 mmol/L (ref 96–106)
Creatinine, Ser: 1.36 mg/dL — ABNORMAL HIGH (ref 0.76–1.27)
Glucose: 137 mg/dL — ABNORMAL HIGH (ref 70–99)
Potassium: 4.4 mmol/L (ref 3.5–5.2)
Sodium: 142 mmol/L (ref 134–144)
eGFR: 51 mL/min/{1.73_m2} — ABNORMAL LOW (ref >59)

## 2020-12-24 ENCOUNTER — Ambulatory Visit (INDEPENDENT_AMBULATORY_CARE_PROVIDER_SITE_OTHER)
Admission: RE | Admit: 2020-12-24 | Discharge: 2020-12-24 | Disposition: A | Discharge status: Home / Self Care | Payer: Medicare Other | Source: Ambulatory Visit | Attending: Student | Admitting: Student

## 2020-12-24 ENCOUNTER — Other Ambulatory Visit: Payer: Self-pay

## 2020-12-24 DIAGNOSIS — I77810 Thoracic aortic ectasia: Secondary | ICD-10-CM

## 2020-12-24 MED ORDER — IOHEXOL 350 MG/ML SOLN
80.0000 mL | Freq: Once | INTRAVENOUS | Status: AC | PRN
  Administered 2020-12-24: 80 mL via INTRAVENOUS

## 2020-12-25 DIAGNOSIS — Z23 Encounter for immunization: Secondary | ICD-10-CM | POA: Diagnosis not present

## 2020-12-25 DIAGNOSIS — E538 Deficiency of other specified B group vitamins: Secondary | ICD-10-CM | POA: Diagnosis not present

## 2020-12-29 ENCOUNTER — Other Ambulatory Visit: Payer: Self-pay

## 2020-12-29 DIAGNOSIS — E039 Hypothyroidism, unspecified: Secondary | ICD-10-CM | POA: Diagnosis not present

## 2020-12-29 DIAGNOSIS — J449 Chronic obstructive pulmonary disease, unspecified: Secondary | ICD-10-CM | POA: Diagnosis not present

## 2020-12-29 DIAGNOSIS — I77810 Thoracic aortic ectasia: Secondary | ICD-10-CM

## 2020-12-29 DIAGNOSIS — G4700 Insomnia, unspecified: Secondary | ICD-10-CM | POA: Diagnosis not present

## 2020-12-29 DIAGNOSIS — E1169 Type 2 diabetes mellitus with other specified complication: Secondary | ICD-10-CM | POA: Diagnosis not present

## 2020-12-29 DIAGNOSIS — I10 Essential (primary) hypertension: Secondary | ICD-10-CM | POA: Diagnosis not present

## 2020-12-29 DIAGNOSIS — I7123 Aneurysm of the descending thoracic aorta, without rupture: Secondary | ICD-10-CM

## 2020-12-29 DIAGNOSIS — E7800 Pure hypercholesterolemia, unspecified: Secondary | ICD-10-CM | POA: Diagnosis not present

## 2020-12-29 DIAGNOSIS — N179 Acute kidney failure, unspecified: Secondary | ICD-10-CM | POA: Diagnosis not present

## 2020-12-29 DIAGNOSIS — N1832 Chronic kidney disease, stage 3b: Secondary | ICD-10-CM | POA: Diagnosis not present

## 2020-12-29 DIAGNOSIS — I359 Nonrheumatic aortic valve disorder, unspecified: Secondary | ICD-10-CM

## 2020-12-29 DIAGNOSIS — E785 Hyperlipidemia, unspecified: Secondary | ICD-10-CM | POA: Diagnosis not present

## 2020-12-29 DIAGNOSIS — E1121 Type 2 diabetes mellitus with diabetic nephropathy: Secondary | ICD-10-CM | POA: Diagnosis not present

## 2020-12-29 DIAGNOSIS — E119 Type 2 diabetes mellitus without complications: Secondary | ICD-10-CM | POA: Diagnosis not present

## 2020-12-31 ENCOUNTER — Telehealth: Payer: Self-pay | Admitting: Cardiovascular Disease

## 2020-12-31 NOTE — Telephone Encounter (Signed)
Pt  is returning call for pt's CT results

## 2020-12-31 NOTE — Telephone Encounter (Signed)
Spoke with pt's daughter Cordelia Pen (ok per DPR) regarding recent CTA chest results. Provided result per Sande Rives, PA-C. All questions answered. Daughter verbalizes understanding. [name] would like for information on thoracic aneurysms sent in the mail to pt's address.

## 2020-12-31 NOTE — Telephone Encounter (Signed)
MAILED AS REQUESTED

## 2020-12-31 NOTE — Telephone Encounter (Signed)
Left message for pt to call back  °

## 2021-01-01 DIAGNOSIS — I10 Essential (primary) hypertension: Secondary | ICD-10-CM | POA: Diagnosis not present

## 2021-01-01 DIAGNOSIS — I5022 Chronic systolic (congestive) heart failure: Secondary | ICD-10-CM | POA: Diagnosis not present

## 2021-01-02 LAB — BASIC METABOLIC PANEL
BUN/Creatinine Ratio: 11 (ref 10–24)
BUN: 15 mg/dL (ref 8–27)
CO2: 26 mmol/L (ref 20–29)
Calcium: 9.1 mg/dL (ref 8.6–10.2)
Chloride: 102 mmol/L (ref 96–106)
Creatinine, Ser: 1.37 mg/dL — ABNORMAL HIGH (ref 0.76–1.27)
Glucose: 202 mg/dL — ABNORMAL HIGH (ref 70–99)
Potassium: 4.5 mmol/L (ref 3.5–5.2)
Sodium: 142 mmol/L (ref 134–144)
eGFR: 51 mL/min/{1.73_m2} — ABNORMAL LOW (ref >59)

## 2021-01-07 DIAGNOSIS — J449 Chronic obstructive pulmonary disease, unspecified: Secondary | ICD-10-CM | POA: Diagnosis not present

## 2021-01-07 DIAGNOSIS — E785 Hyperlipidemia, unspecified: Secondary | ICD-10-CM | POA: Diagnosis not present

## 2021-01-07 DIAGNOSIS — N1832 Chronic kidney disease, stage 3b: Secondary | ICD-10-CM | POA: Diagnosis not present

## 2021-01-07 DIAGNOSIS — E039 Hypothyroidism, unspecified: Secondary | ICD-10-CM | POA: Diagnosis not present

## 2021-01-07 DIAGNOSIS — D510 Vitamin B12 deficiency anemia due to intrinsic factor deficiency: Secondary | ICD-10-CM | POA: Diagnosis not present

## 2021-01-07 DIAGNOSIS — G4700 Insomnia, unspecified: Secondary | ICD-10-CM | POA: Diagnosis not present

## 2021-01-07 DIAGNOSIS — I10 Essential (primary) hypertension: Secondary | ICD-10-CM | POA: Diagnosis not present

## 2021-01-07 DIAGNOSIS — E1169 Type 2 diabetes mellitus with other specified complication: Secondary | ICD-10-CM | POA: Diagnosis not present

## 2021-01-07 DIAGNOSIS — E1121 Type 2 diabetes mellitus with diabetic nephropathy: Secondary | ICD-10-CM | POA: Diagnosis not present

## 2021-01-07 DIAGNOSIS — I2510 Atherosclerotic heart disease of native coronary artery without angina pectoris: Secondary | ICD-10-CM | POA: Diagnosis not present

## 2021-01-22 ENCOUNTER — Telehealth: Payer: Self-pay | Admitting: Cardiovascular Disease

## 2021-01-22 NOTE — Telephone Encounter (Signed)
Pts daughter is reaching out requesting that labs be sent to the kidney doctor... please advise

## 2021-01-22 NOTE — Telephone Encounter (Signed)
Called pt's daughter to inquire about pt's Nephrologist. She was able to tell me the doctor's name and practice name. The lab results were sent to the provider, Dr. Pearson Grippe, via Epic.

## 2021-01-27 DIAGNOSIS — I129 Hypertensive chronic kidney disease with stage 1 through stage 4 chronic kidney disease, or unspecified chronic kidney disease: Secondary | ICD-10-CM | POA: Diagnosis not present

## 2021-01-27 DIAGNOSIS — N183 Chronic kidney disease, stage 3 unspecified: Secondary | ICD-10-CM | POA: Diagnosis not present

## 2021-02-19 ENCOUNTER — Ambulatory Visit (INDEPENDENT_AMBULATORY_CARE_PROVIDER_SITE_OTHER): Payer: Medicare Other

## 2021-02-19 DIAGNOSIS — I442 Atrioventricular block, complete: Secondary | ICD-10-CM | POA: Diagnosis not present

## 2021-02-22 LAB — CUP PACEART REMOTE DEVICE CHECK
Battery Impedance: 1578 Ohm
Battery Remaining Longevity: 47 mo
Battery Voltage: 2.76 V
Brady Statistic RV Percent Paced: 99 %
Date Time Interrogation Session: 20221216153631
Implantable Lead Implant Date: 20140318
Implantable Lead Implant Date: 20140318
Implantable Lead Location: 753859
Implantable Lead Location: 753860
Implantable Lead Model: 5076
Implantable Lead Model: 5076
Implantable Pulse Generator Implant Date: 20140318
Lead Channel Impedance Value: 617 Ohm
Lead Channel Impedance Value: 67 Ohm
Lead Channel Pacing Threshold Amplitude: 0.75 V
Lead Channel Pacing Threshold Pulse Width: 0.4 ms
Lead Channel Setting Pacing Amplitude: 2.5 V
Lead Channel Setting Pacing Pulse Width: 0.4 ms
Lead Channel Setting Sensing Sensitivity: 4 mV

## 2021-03-03 NOTE — Progress Notes (Signed)
Remote pacemaker transmission.   

## 2021-03-09 ENCOUNTER — Other Ambulatory Visit: Payer: Self-pay

## 2021-03-09 MED ORDER — APIXABAN 5 MG PO TABS: 5.0000 mg | ORAL_TABLET | Freq: Two times a day (BID) | ORAL | 3 refills | Status: DC

## 2021-03-09 NOTE — Telephone Encounter (Signed)
Prescription refill request for Eliquis received. Indication: Afib  Last office visit:12/21/20 ([place])  Scr:1.37 (01/01/21)  Age: 88 Weight: 101.7kg  Appropriate dose and refill sent to requested pharmacy.

## 2021-03-10 ENCOUNTER — Other Ambulatory Visit: Payer: Self-pay

## 2021-03-10 MED ORDER — POTASSIUM CHLORIDE CRYS ER 20 MEQ PO TBCR: EXTENDED_RELEASE_TABLET | ORAL | 3 refills | Status: DC

## 2021-03-16 ENCOUNTER — Other Ambulatory Visit: Payer: Self-pay | Admitting: *Deleted

## 2021-03-16 MED ORDER — APIXABAN 5 MG PO TABS: 5.0000 mg | ORAL_TABLET | Freq: Two times a day (BID) | ORAL | 1 refills | Status: DC

## 2021-03-16 NOTE — Telephone Encounter (Signed)
Prescription refill request for Eliquis received. Indication: afib  Last office visit: [facility], 12/21/2020 Scr: 1.37, 01/01/2021 Age: 88 yo  Weight: 101.7 kg  Refill sent.

## 2021-04-05 DIAGNOSIS — N179 Acute kidney failure, unspecified: Secondary | ICD-10-CM | POA: Diagnosis not present

## 2021-04-05 DIAGNOSIS — E1169 Type 2 diabetes mellitus with other specified complication: Secondary | ICD-10-CM | POA: Diagnosis not present

## 2021-04-05 DIAGNOSIS — E119 Type 2 diabetes mellitus without complications: Secondary | ICD-10-CM | POA: Diagnosis not present

## 2021-04-05 DIAGNOSIS — E1121 Type 2 diabetes mellitus with diabetic nephropathy: Secondary | ICD-10-CM | POA: Diagnosis not present

## 2021-04-05 DIAGNOSIS — E785 Hyperlipidemia, unspecified: Secondary | ICD-10-CM | POA: Diagnosis not present

## 2021-04-05 DIAGNOSIS — I10 Essential (primary) hypertension: Secondary | ICD-10-CM | POA: Diagnosis not present

## 2021-04-05 DIAGNOSIS — J449 Chronic obstructive pulmonary disease, unspecified: Secondary | ICD-10-CM | POA: Diagnosis not present

## 2021-04-05 DIAGNOSIS — G4700 Insomnia, unspecified: Secondary | ICD-10-CM | POA: Diagnosis not present

## 2021-04-23 DIAGNOSIS — E538 Deficiency of other specified B group vitamins: Secondary | ICD-10-CM | POA: Diagnosis not present

## 2021-05-21 DIAGNOSIS — E538 Deficiency of other specified B group vitamins: Secondary | ICD-10-CM | POA: Diagnosis not present

## 2021-05-27 DIAGNOSIS — E1121 Type 2 diabetes mellitus with diabetic nephropathy: Secondary | ICD-10-CM | POA: Diagnosis not present

## 2021-05-27 DIAGNOSIS — E1169 Type 2 diabetes mellitus with other specified complication: Secondary | ICD-10-CM | POA: Diagnosis not present

## 2021-05-27 DIAGNOSIS — D509 Iron deficiency anemia, unspecified: Secondary | ICD-10-CM | POA: Diagnosis not present

## 2021-05-27 DIAGNOSIS — E039 Hypothyroidism, unspecified: Secondary | ICD-10-CM | POA: Diagnosis not present

## 2021-05-27 DIAGNOSIS — N179 Acute kidney failure, unspecified: Secondary | ICD-10-CM | POA: Diagnosis not present

## 2021-05-27 DIAGNOSIS — N1832 Chronic kidney disease, stage 3b: Secondary | ICD-10-CM | POA: Diagnosis not present

## 2021-05-27 DIAGNOSIS — I2510 Atherosclerotic heart disease of native coronary artery without angina pectoris: Secondary | ICD-10-CM | POA: Diagnosis not present

## 2021-05-27 DIAGNOSIS — J449 Chronic obstructive pulmonary disease, unspecified: Secondary | ICD-10-CM | POA: Diagnosis not present

## 2021-05-27 DIAGNOSIS — E785 Hyperlipidemia, unspecified: Secondary | ICD-10-CM | POA: Diagnosis not present

## 2021-05-27 DIAGNOSIS — D510 Vitamin B12 deficiency anemia due to intrinsic factor deficiency: Secondary | ICD-10-CM | POA: Diagnosis not present

## 2021-05-28 DIAGNOSIS — D510 Vitamin B12 deficiency anemia due to intrinsic factor deficiency: Secondary | ICD-10-CM | POA: Diagnosis not present

## 2021-05-28 DIAGNOSIS — D509 Iron deficiency anemia, unspecified: Secondary | ICD-10-CM | POA: Diagnosis not present

## 2021-05-28 DIAGNOSIS — E7800 Pure hypercholesterolemia, unspecified: Secondary | ICD-10-CM | POA: Diagnosis not present

## 2021-05-28 DIAGNOSIS — E1169 Type 2 diabetes mellitus with other specified complication: Secondary | ICD-10-CM | POA: Diagnosis not present

## 2021-06-02 DIAGNOSIS — D509 Iron deficiency anemia, unspecified: Secondary | ICD-10-CM | POA: Diagnosis not present

## 2021-06-08 ENCOUNTER — Other Ambulatory Visit: Payer: Self-pay | Admitting: Physician Assistant

## 2021-06-09 ENCOUNTER — Ambulatory Visit (INDEPENDENT_AMBULATORY_CARE_PROVIDER_SITE_OTHER): Payer: Medicare Other

## 2021-06-09 DIAGNOSIS — I442 Atrioventricular block, complete: Secondary | ICD-10-CM | POA: Diagnosis not present

## 2021-06-09 LAB — CUP PACEART REMOTE DEVICE CHECK
Battery Impedance: 1661 Ohm
Battery Remaining Longevity: 45 mo
Battery Voltage: 2.76 V
Brady Statistic RV Percent Paced: 99 %
Date Time Interrogation Session: 20230404135720
Implantable Lead Implant Date: 20140318
Implantable Lead Implant Date: 20140318
Implantable Lead Location: 753859
Implantable Lead Location: 753860
Implantable Lead Model: 5076
Implantable Lead Model: 5076
Implantable Pulse Generator Implant Date: 20140318
Lead Channel Impedance Value: 578 Ohm
Lead Channel Impedance Value: 67 Ohm
Lead Channel Pacing Threshold Amplitude: 0.75 V
Lead Channel Pacing Threshold Pulse Width: 0.4 ms
Lead Channel Setting Pacing Amplitude: 2.5 V
Lead Channel Setting Pacing Pulse Width: 0.4 ms
Lead Channel Setting Sensing Sensitivity: 2.8 mV

## 2021-06-24 NOTE — Progress Notes (Signed)
Remote pacemaker transmission.   

## 2021-07-15 DIAGNOSIS — M1712 Unilateral primary osteoarthritis, left knee: Secondary | ICD-10-CM | POA: Diagnosis not present

## 2021-07-17 ENCOUNTER — Other Ambulatory Visit: Payer: Self-pay | Admitting: Physician Assistant

## 2021-07-22 ENCOUNTER — Other Ambulatory Visit: Payer: Self-pay | Admitting: Student

## 2021-07-22 ENCOUNTER — Other Ambulatory Visit: Payer: Self-pay | Admitting: Physician Assistant

## 2021-08-05 DIAGNOSIS — E538 Deficiency of other specified B group vitamins: Secondary | ICD-10-CM | POA: Diagnosis not present

## 2021-08-08 ENCOUNTER — Other Ambulatory Visit: Payer: Self-pay | Admitting: Cardiovascular Disease

## 2021-08-26 ENCOUNTER — Telehealth: Payer: Self-pay | Admitting: Cardiovascular Disease

## 2021-08-26 NOTE — Telephone Encounter (Signed)
*  STAT* If patient is at the pharmacy, call can be transferred to refill team.   1. Which medications need to be refilled? (please list name of each medication and dose if known)   furosemide (LASIX) 40 MG tablet    2. Which pharmacy/location (including street and city if local pharmacy) is medication to be sent to? [place], [place] - [address] AT [place]  3. Do they need a 30 day or 90 day supply?  90 day

## 2021-08-27 MED ORDER — FUROSEMIDE 40 MG PO TABS: ORAL_TABLET | ORAL | 0 refills | Status: DC

## 2021-09-08 DIAGNOSIS — E538 Deficiency of other specified B group vitamins: Secondary | ICD-10-CM | POA: Diagnosis not present

## 2021-09-08 NOTE — Progress Notes (Deleted)
Cardiology Clinic Note   Patient Name: ROBBEN JAGIELLO Date of Encounter: 09/08/2021  Primary Care Provider:  Vernie Shanks, MD Primary Cardiologist:  Sanda Klein, MD  Patient Profile    BRENDEN RUDMAN 88 year old male presents the clinic today for follow-up evaluation of his essential hypertension and ischemic cardiomyopathy.  Past Medical History    Past Medical History:  Diagnosis Date   Anemia    history of   Arthritis    Atrial flutter (HCC)    severe bradycardia   CHF (congestive heart failure) (HCC)    Constipation    COPD (chronic obstructive pulmonary disease) (HCC)    Coronary artery disease    Cough    Diarrhea    Diastolic dysfunction    Echo 05/11/1060-IRSWN I diastolic dysfunction,EF 46-27%   DM2 (diabetes mellitus, type 2) (HCC)    Dyslipidemia    History of kidney stones    HTN (hypertension)    Hyperlipidemia    Leg swelling    Mobitz type 1 second degree atrioventricular block    Pacemaker 05/22/2012   Dr  Croitoru    [employer] Adapta   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 SVG to firts & second diagonals,SVG to forth posterolateral branch to RCA   Sore throat    Wheezing    Past Surgical History:  Procedure Laterality Date   CABG x5  09/13/2010   Dr Farrel Conners to LAD,SVG to obtuse marginal,seq. SVG to first & second diagonals,SVG to fourth posterolateral branch RCA   CARDIAC CATHETERIZATION  09/10/2010   severe LAD,D1,D2 & ostial D2   CARDIAC CATHETERIZATION N/A 08/28/2015   Procedure: Right/Left Heart Cath and Coronary/Graft Angiography;  Surgeon: Troy Sine, MD;  Location: [facility];  Service: Cardiovascular;  Laterality: N/A;   CHOLECYSTECTOMY  october 2012   CORONARY ARTERY BYPASS GRAFT     INSERT / REPLACE / REMOVE PACEMAKER  05/22/2012    dual chamber   PERMANENT PACEMAKER INSERTION N/A 05/22/2012   Procedure: PERMANENT PACEMAKER INSERTION;  Surgeon: Sanda Klein, MD;  Location: [facility];   Service: Cardiovascular;  Laterality: N/A;   repair of left indirect inguinal hernia with mesh.     repair of umbilical hernia.     TOTAL KNEE ARTHROPLASTY Right 09/26/2016   Procedure: TOTAL KNEE ARTHROPLASTY;  Surgeon: Frederik Pear, MD;  Location: [facility];  Service: Orthopedics;  Laterality: Right;    Allergies  Allergies  Allergen Reactions   No Known Allergies     History of Present Illness    BRADFORD CAZIER is a PMH of coronary artery disease status post bypass surgery, chronic diastolic CHF, HTN, diabetes, HLD, second-degree AV block status post dual-chamber pacemaker placement 2014, paroxysmal atrial tachycardia, atrial flutter and atrial fibrillation.  Echocardiogram 2017 showed an LVEF of 30-35%.  He underwent cardiac catheterization 6/17 which showed an estimated LVEF of greater than 45-50%.  He was noted to have severe coronary disease with patent LIMA-LAD, SVG-D1-D2, SVG-distal circumflex, SVG-PLA of RCA all widely patent.  Medical management was recommended.  He was seen by Dr. Sallyanne Kuster on 12/21/2020.  During that time he denied chest discomfort and dyspnea.  He denied orthopnea PND, presyncope and syncope.  He denied palpitations and lower extremity swelling.  He denied unexplained weight gain hemoptysis and wheezing.  He was limited in his physical activity due to his knee pain.  He denied bleeding issues and falls.  His device was  functioning normally and he was noted to be 98% V paced with an underlying escape of approximately 37 bpm.  He presents to the clinic today for follow-up evaluation states***  *** denies chest pain, shortness of breath, lower extremity edema, fatigue, palpitations, melena, hematuria, hemoptysis, diaphoresis, weakness, presyncope, syncope, orthopnea, and PND.  Coronary artery disease-no recent episodes of chest discomfort.  Underwent cardiac catheterization 6/17 which showed widely patent bypass grafts. Continue metoprolol, rosuvastatin Heart  healthy low-sodium diet-salty 6 given Increase physical activity as tolerated  Chronic combined systolic and diastolic CHF-euvolemic today.  No increased DOE or activity intolerance. Continue continue furosemide, metoprolol, hydralazine, losartan, potassium Heart healthy low-sodium diet-salty 6 given Increase physical activity as tolerated  Hyperlipidemia-LDL*** Continue rosuvastatin Heart healthy low-sodium high-fiber diet Increase physical activity as tolerated  Essential hypertension-BP today***.  Well-controlled at home. Continue metoprolol, hydralazine, Heart healthy low-sodium diet-salty 6 given Increase physical activity as tolerated  Complete heart block-status post on dual-chamber permanent pacemaker 2014.  Device interrogated on 06/08/2021.  Patient remained in permanent atrial fibrillation with complete heart block and pacemaker dependent.  Battery status good.  Lead measurements stable.  No clinical significant episodes of high ventricular rate. Follows with Dr. Loletha Grayer  Paroxysmal atrial fibrillation-heart rate today ***.  Reports compliance with apixaban and denies bleeding issues. Continue apixaban, metoprolol  Disposition: Follow-up with Dr. Sallyanne Kuster in 6-9 months.  Home Medications    Prior to Admission medications   Medication Sig Start Date End Date Taking? Authorizing Provider  apixaban (ELIQUIS) 5 MG TABS tablet Take 1 tablet (5 mg total) by mouth 2 (two) times daily. 03/16/21 03/16/22  Pixie Casino, MD  Cholecalciferol (VITAMIN D3) 125 MCG (5000 UT) TABS Take 5,000 Units by mouth daily. Patient not taking: Reported on 12/21/2020    [provider]  Cyanocobalamin (B-12) 1000 MCG/ML KIT Inject 1 Dose as directed every 30 (thirty) days.     [provider]  ferrous sulfate 325 (65 FE) MG tablet Take 325 mg by mouth daily with breakfast. Patient not taking: Reported on 12/21/2020    [provider]  fluticasone furoate-vilanterol (BREO  ELLIPTA) 200-25 MCG/INH AEPB Inhale 1 puff into the lungs 2 (two) times daily.    [provider]  furosemide (LASIX) 40 MG tablet TAKE 2 TABLETS BY MOUTH DAILY 08/27/21   Croitoru, Dani Gobble, MD  hydrALAZINE (APRESOLINE) 25 MG tablet Take 1 tablet (25 mg total) by mouth 3 (three) times daily for 30 days. 06/23/18 12/21/20  Thurnell Lose, MD  LANTUS SOLOSTAR 100 UNIT/ML Solostar Pen Inject 10 Units into the skin at bedtime. 08/26/19   [provider]  levothyroxine (SYNTHROID) 75 MCG tablet Take 75 mcg by mouth daily. 10/18/20   [provider]  losartan (COZAAR) 25 MG tablet TAKE 1 TABLET(25 MG) BY MOUTH DAILY 12/22/20   Lelon Perla, MD  metoprolol succinate (TOPROL-XL) 50 MG 24 hr tablet Take 1 tablet (50 mg total) by mouth daily. Schedule an appointment for further refills 07/22/21   Darreld Mclean, PA-C  [facility] VERIO test strip daily. As directed 07/05/14   [provider]  potassium chloride SA (KLOR-CON M) 20 MEQ tablet TAKE 1 TABLET(20 MEQ) BY MOUTH DAILY 03/10/21   Croitoru, Mihai, MD  rosuvastatin (CRESTOR) 40 MG tablet Take 40 mg by mouth daily.    [provider]  traZODone (DESYREL) 50 MG tablet Take 50 mg by mouth at bedtime as needed for sleep.     [provider]  Grant Ruts  INHUB 250-50 MCG/DOSE AEPB Inhale 1 puff into the lungs 2 (two) times a day. 06/27/18   [provider]    Family History    Family History  Problem Relation Age of Onset   Heart disease Father    Heart disease Brother        s/p cabg   Heart disease Maternal Uncle    He indicated that his mother is deceased. He indicated that his father is deceased. He indicated that the status of his brother is unknown. He indicated that his maternal grandmother is deceased. He indicated that his maternal grandfather is deceased. He indicated that his paternal grandmother is deceased. He indicated that his paternal grandfather is deceased. He indicated that the  status of his maternal uncle is unknown.  Social History    Social History   Socioeconomic History   Marital status: Widowed    Spouse name: Not on file   Number of children: 2   Years of education: Not on file   Highest education level: Not on file  Occupational History   Not on file  Tobacco Use   Smoking status: Former    Years: 40.00    Types: Cigarettes    Quit date: 08/06/2010    Years since quitting: 11.0   Smokeless tobacco: Never  Substance and Sexual Activity   Alcohol use: Yes    Comment: 5-6 beers per week   Drug use: Yes    Types: Marijuana    Comment: couple times a month   Sexual activity: Not on file  Other Topics Concern   Not on file  Social History Narrative   Not on file   Social Determinants of Health   Financial Resource Strain: Not on file  Food Insecurity: No Food Insecurity (12/21/2020)   Hunger Vital Sign    Worried About Running Out of Food in the Last Year: Never true    Ran Out of Food in the Last Year: Never true  Transportation Needs: No Transportation Needs (12/21/2020)   PRAPARE - Hydrologist (Medical): No    Lack of Transportation (Non-Medical): No  Physical Activity: Not on file  Stress: Not on file  Social Connections: Not on file  Intimate Partner Violence: Not on file     Review of Systems    General:  No chills, fever, night sweats or weight changes.  Cardiovascular:  No chest pain, dyspnea on exertion, edema, orthopnea, palpitations, paroxysmal nocturnal dyspnea. Dermatological: No rash, lesions/masses Respiratory: No cough, dyspnea Urologic: No hematuria, dysuria Abdominal:   No nausea, vomiting, diarrhea, bright red blood per rectum, melena, or hematemesis Neurologic:  No visual changes, wkns, changes in mental status. All other systems reviewed and are otherwise negative except as noted above.  Physical Exam    VS:  There were no vitals taken for this visit. , BMI There is no height or  weight on file to calculate BMI. GEN: Well nourished, well developed, in no acute distress. HEENT: normal. Neck: Supple, no JVD, carotid bruits, or masses. Cardiac: RRR, no murmurs, rubs, or gallops. No clubbing, cyanosis, edema.  Radials/DP/PT 2+ and equal bilaterally.  Respiratory:  Respirations regular and unlabored, clear to auscultation bilaterally. GI: Soft, nontender, nondistended, BS + x 4. MS: no deformity or atrophy. Skin: warm and dry, no rash. Neuro:  Strength and sensation are intact. Psych: Normal affect.  Accessory Clinical Findings    Recent Labs: 10/30/2020: BNP 293.4; Hemoglobin 10.0; Platelets 270 01/01/2021: BUN  15; Creatinine, Ser 1.37; Potassium 4.5; Sodium 142   Recent Lipid Panel    Component Value Date/Time   CHOL 176 09/10/2010 0623   TRIG 94 09/10/2010 0623   HDL 50 09/10/2010 0623   CHOLHDL 3.5 09/10/2010 0623   VLDL 19 09/10/2010 0623   LDLCALC 107 (H) 09/10/2010 0623    ECG personally reviewed by me today- *** - No acute changes  Echocardiogram 11/23/2020 IMPRESSIONS     1. Left ventricular ejection fraction, by estimation, is 35 to 40%. The  left ventricle has moderately decreased function. The left ventricle  demonstrates regional wall motion abnormalities with septal hypokinesis,  inferior hypokinesis, and  septal-lateral dyssynchrony likely due to RV pacing. There is moderate  left ventricular hypertrophy. Left ventricular diastolic parameters are  consistent with Grade II diastolic dysfunction (pseudonormalization). The  average left ventricular global  longitudinal strain is -13.6 %. The global longitudinal strain is  abnormal.   2. Right ventricular systolic function is moderately reduced. The right  ventricular size is normal. There is normal pulmonary artery systolic  pressure. The estimated right ventricular systolic pressure is 35.8 mmHg.   3. Left atrial size was mildly dilated.   4. Right atrial size was mildly dilated.   5.  The mitral valve is normal in structure. Mild mitral valve  regurgitation. No evidence of mitral stenosis.   6. The aortic valve is tricuspid. Aortic valve regurgitation is not  visualized. Mild aortic valve sclerosis is present, with no evidence of  aortic valve stenosis.   7. Aortic dilatation noted. There is moderate dilatation of the ascending  aorta, measuring 47 mm.   8. The inferior vena cava is dilated in size with >50% respiratory  variability, suggesting right atrial pressure of 8 mmHg.  Assessment & Plan   1.  ***   Jossie Ng. Talasia Saulter NP-C     09/08/2021, 1:18 PM [facility] [place] [address] Office [phone] Fax [phone]  Notice: This dictation was prepared with Dragon dictation along with smaller phrase technology. Any transcriptional errors that result from this process are unintentional and may not be corrected upon review.  I spent***minutes examining this patient, reviewing medications, and using patient centered shared decision making involving her cardiac care.  Prior to her visit I spent greater than 20 minutes reviewing her past medical history,  medications, and prior cardiac tests.

## 2021-09-10 ENCOUNTER — Ambulatory Visit: Payer: Medicare Other | Admitting: General Practice

## 2021-09-18 ENCOUNTER — Other Ambulatory Visit: Payer: Self-pay | Admitting: Cardiovascular Disease

## 2021-09-29 ENCOUNTER — Other Ambulatory Visit: Payer: Self-pay | Admitting: Student

## 2021-10-01 DIAGNOSIS — Z0000 Encounter for general adult medical examination without abnormal findings: Secondary | ICD-10-CM | POA: Diagnosis not present

## 2021-10-18 ENCOUNTER — Ambulatory Visit: Payer: Medicare Other

## 2021-10-19 LAB — CUP PACEART REMOTE DEVICE CHECK
Battery Impedance: 1833 Ohm
Battery Remaining Longevity: 40 mo
Battery Voltage: 2.76 V
Brady Statistic RV Percent Paced: 99 %
Date Time Interrogation Session: 20230811170352
Implantable Lead Implant Date: 20140318
Implantable Lead Implant Date: 20140318
Implantable Lead Location: 753859
Implantable Lead Location: 753860
Implantable Lead Model: 5076
Implantable Lead Model: 5076
Implantable Pulse Generator Implant Date: 20140318
Lead Channel Impedance Value: 487 Ohm
Lead Channel Impedance Value: 67 Ohm
Lead Channel Pacing Threshold Amplitude: 0.625 V
Lead Channel Pacing Threshold Pulse Width: 0.4 ms
Lead Channel Setting Pacing Amplitude: 2.5 V
Lead Channel Setting Pacing Pulse Width: 0.4 ms
Lead Channel Setting Sensing Sensitivity: 2.8 mV

## 2021-10-22 ENCOUNTER — Telehealth: Payer: Self-pay | Admitting: Cardiovascular Disease

## 2021-10-22 NOTE — Telephone Encounter (Signed)
Returned the call to the patient's daughter, per the dpr. She stated that the patient has been having intermittent edema and oozing in his legs, bilateral. The oozing just occurs in the left leg. This has been been a problem in the past. She stated that she gets inconsistent answers from the patient concerning how much Lasix he takes and how bad the swelling is. He sates that he has been taking anywhere from 40 -120 mg daily. She did state that his current weight was 225 lbs which is consistent with last years office visit weight. He denies shortness of breath.  The daughter has been made aware that it sounds like the patient should be seen in person to discuss and assess the swelling and medication dosages. Appointment made for 8/22 with Dr. Sallyanne Kuster. If she feels like the patient is getting worse, they has been advised to go to an urgent care to be assessed.

## 2021-10-22 NOTE — Telephone Encounter (Signed)
Pt c/o swelling: STAT is pt has developed SOB within 24 hours  If swelling, where is the swelling located? Both legs swollen, left leg leaking fluid   How much weight have you gained and in what time span? Believes he has gained weight but is not sure how much  Have you gained 3 pounds in a day or 5 pounds in a week? Not sure  Do you have a log of your daily weights (if so, list)? No, scale not working, daughter gave him hers  Are you currently taking a fluid pill? Yes, has been doubling up  Are you currently SOB? no  Have you traveled recently? no  Patient's daughter says the patient's left leg has been leaking fluid. She says one leg has had a vein removed from a surgery in the past. She states the patient's iron is also low and his energy is lower

## 2021-10-26 ENCOUNTER — Encounter: Payer: Self-pay | Admitting: Cardiovascular Disease

## 2021-10-26 ENCOUNTER — Ambulatory Visit (INDEPENDENT_AMBULATORY_CARE_PROVIDER_SITE_OTHER): Payer: Medicare Other | Admitting: Cardiovascular Disease

## 2021-10-26 VITALS — BP 124/64 | HR 78 | Ht 71.0 in | Wt 221.2 lb

## 2021-10-26 DIAGNOSIS — D6869 Other thrombophilia: Secondary | ICD-10-CM | POA: Diagnosis not present

## 2021-10-26 DIAGNOSIS — I4821 Permanent atrial fibrillation: Secondary | ICD-10-CM | POA: Diagnosis not present

## 2021-10-26 DIAGNOSIS — Z950 Presence of cardiac pacemaker: Secondary | ICD-10-CM

## 2021-10-26 DIAGNOSIS — I25810 Atherosclerosis of coronary artery bypass graft(s) without angina pectoris: Secondary | ICD-10-CM | POA: Diagnosis not present

## 2021-10-26 DIAGNOSIS — N1832 Chronic kidney disease, stage 3b: Secondary | ICD-10-CM

## 2021-10-26 DIAGNOSIS — I5022 Chronic systolic (congestive) heart failure: Secondary | ICD-10-CM

## 2021-10-26 DIAGNOSIS — J449 Chronic obstructive pulmonary disease, unspecified: Secondary | ICD-10-CM

## 2021-10-26 DIAGNOSIS — I5023 Acute on chronic systolic (congestive) heart failure: Secondary | ICD-10-CM

## 2021-10-26 DIAGNOSIS — Z794 Long term (current) use of insulin: Secondary | ICD-10-CM

## 2021-10-26 DIAGNOSIS — I442 Atrioventricular block, complete: Secondary | ICD-10-CM

## 2021-10-26 DIAGNOSIS — E785 Hyperlipidemia, unspecified: Secondary | ICD-10-CM

## 2021-10-26 DIAGNOSIS — E0822 Diabetes mellitus due to underlying condition with diabetic chronic kidney disease: Secondary | ICD-10-CM

## 2021-10-26 DIAGNOSIS — I10 Essential (primary) hypertension: Secondary | ICD-10-CM

## 2021-10-26 MED ORDER — DAPAGLIFLOZIN PROPANEDIOL 10 MG PO TABS: 10.0000 mg | ORAL_TABLET | Freq: Every day | ORAL | 3 refills | Status: DC

## 2021-10-26 MED ORDER — LANTUS SOLOSTAR 100 UNIT/ML ~~LOC~~ SOPN: 20.0000 [IU] | PEN_INJECTOR | Freq: Every day | SUBCUTANEOUS | 0 refills | Status: AC

## 2021-10-26 NOTE — Progress Notes (Signed)
Patient ID: Jesus Oconnor, male   DOB: 25-Dec-1933, 88 y.o.   MRN: [id number]     Cardiology Office Note    Date:  10/28/2021   ID:  Jesus Oconnor, DOB 1933-09-30, MRN [id number]  PCP:  Jesus Shanks, MD (Inactive)  Cardiologist:   Jesus Klein, MD   Chief Complaint  Patient presents with   Follow-up    CHF, CAD, AV block, pacemaker     History of Present Illness:  Jesus Oconnor is a 88 y.o. 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  He has been in persistent atrial fibrillation since January 2021 and the device is programmed VVIR. He is asymptomatic and compliant with anticoagulation.  He has recently developed worsening lower extremity edema, developing some small blisters on both legs and weeping wounds from his anterior left shin.  He also has increased shortness of breath with activity, such as walking to the mailbox.  Not clear that he is paying a whole lot of attention to sodium restriction and diet.  Recently has doubled up on his dose of furosemide for the last 2 or 3 days.  He does not have orthopnea or PND.  He has not had any falls, serious injuries or bleeding problems on anticoagulation.  He has not really gained any weight since his last appointment based on our scales, although he is clearly more edematous.  He has not had any problems with chest pain either at rest or with activity.  Pacemaker interrogation shows normal device function.  He has virtually 100% ventricular pacing (98.7%, underlying idioventricular escape rhythm at about 40 bpm has been fairly reliable).  Estimated generator longevity is 3 years.  Lead parameters are normal.  The heart rate histograms appear  appropriate.  There have been no episodes of high ventricular rate.  His daughter reports that he does not need Lantus insulin on a daily basis.  She only administers on days when his glucose is elevated at around 200.  This ends up being 2 or 3 times a week.  Recent hemoglobin A1c was 7.2%.  He had right total knee replacement for severe arthritis.  This is also the site of his saphenous vein harvest for his previous bypass. The right calf often swells a little more than the left.    In 2017, he developed edema and shortness of breath and echocardiography showed an EF had dropped to 30-35 %. On 08/28/2015 he underwent right and left heart catheterization. Estimated LVEF was better at 45-50 %. He had severe native coronary disease, but all his bypasses (LIMA to LAD, SVG sequential diagonal 1-diagonal 2, SVG to distal circumflex, SVG to PLA branch of RCA) were widely patent. Medical therapy was recommended. Past Medical History:  Diagnosis Date   Anemia    history of   Arthritis    Atrial flutter (HCC)    severe bradycardia   CHF (congestive heart failure) (HCC)    Constipation    COPD (chronic obstructive pulmonary disease) (HCC)    Coronary artery disease    Cough    Diarrhea    Diastolic dysfunction    Echo 04/12/3333-KTGYB I diastolic dysfunction,EF 63-89%   DM2 (diabetes mellitus, type 2) (HCC)    Dyslipidemia    History of kidney stones  HTN (hypertension)    Hyperlipidemia    Leg swelling    Mobitz type 1 second degree atrioventricular block    Pacemaker 05/22/2012   Dr  Sallyanne Kuster    Medtronic Adapta   S/P CABG x 5 09/13/2010   LIMA to LAD,SVG to obtuse marginal,seq. SVG to firts & second diagonals,SVG to forth posterolateral branch to RCA   Sore throat    Wheezing     Past Surgical History:  Procedure Laterality Date   CABG x5  09/13/2010   Dr Farrel Conners to LAD,SVG to obtuse marginal,seq. SVG to first & second diagonals,SVG to fourth posterolateral branch RCA   CARDIAC  CATHETERIZATION  09/10/2010   severe LAD,D1,D2 & ostial D2   CARDIAC CATHETERIZATION N/A 08/28/2015   Procedure: Right/Left Heart Cath and Coronary/Graft Angiography;  Surgeon: Troy Sine, MD;  Location: [facility];  Service: Cardiovascular;  Laterality: N/A;   CHOLECYSTECTOMY  october 2012   CORONARY ARTERY BYPASS GRAFT     INSERT / REPLACE / REMOVE PACEMAKER  05/22/2012    dual chamber   PERMANENT PACEMAKER INSERTION N/A 05/22/2012   Procedure: PERMANENT PACEMAKER INSERTION;  Surgeon: Jesus Klein, MD;  Location: [facility];  Service: Cardiovascular;  Laterality: N/A;   repair of left indirect inguinal hernia with mesh.     repair of umbilical hernia.     TOTAL KNEE ARTHROPLASTY Right 09/26/2016   Procedure: TOTAL KNEE ARTHROPLASTY;  Surgeon: Frederik Pear, MD;  Location: [facility];  Service: Orthopedics;  Laterality: Right;    Current Medications: Outpatient Medications Prior to Visit  Medication Sig Dispense Refill   apixaban (ELIQUIS) 5 MG TABS tablet Take 1 tablet (5 mg total) by mouth 2 (two) times daily. 180 tablet 1   Cholecalciferol (VITAMIN D3) 125 MCG (5000 UT) TABS Take 5,000 Units by mouth daily.     Cyanocobalamin (B-12) 1000 MCG/ML KIT Inject 1 Dose as directed every 30 (thirty) days.      ferrous sulfate 325 (65 FE) MG tablet Take 325 mg by mouth daily with breakfast.     fluticasone furoate-vilanterol (BREO ELLIPTA) 200-25 MCG/INH AEPB Inhale 1 puff into the lungs 2 (two) times daily.     furosemide (LASIX) 40 MG tablet Take 2 tablets (80 mg total) by mouth daily. 180 tablet 0   hydrALAZINE (APRESOLINE) 25 MG tablet Take 1 tablet (25 mg total) by mouth 3 (three) times daily for 30 days. 90 tablet 0   levothyroxine (SYNTHROID) 75 MCG tablet Take 75 mcg by mouth daily.     losartan (COZAAR) 25 MG tablet TAKE 1 TABLET(25 MG) BY MOUTH DAILY 90 tablet 3   metoprolol succinate (TOPROL-XL) 50 MG 24 hr tablet TAKE 1 TABLET BY MOUTH DAILY 90 tablet 0   ONETOUCH VERIO test  strip daily. As directed  4   potassium chloride SA (KLOR-CON M) 20 MEQ tablet TAKE 1 TABLET(20 MEQ) BY MOUTH DAILY 90 tablet 3   rosuvastatin (CRESTOR) 40 MG tablet Take 40 mg by mouth daily.     traZODone (DESYREL) 50 MG tablet Take 50 mg by mouth at bedtime as needed for sleep.      WIXELA INHUB 250-50 MCG/DOSE AEPB Inhale 1 puff into the lungs 2 (two) times a day.     LANTUS SOLOSTAR 100 UNIT/ML Solostar Pen Inject 10 Units into the skin at bedtime.     No facility-administered medications prior to visit.     Allergies:   No known allergies   Social History  Socioeconomic History   Marital status: Widowed    Spouse name: Not on file   Number of children: 2   Years of education: Not on file   Highest education level: Not on file  Occupational History   Not on file  Tobacco Use   Smoking status: Former    Years: 40.00    Types: Cigarettes    Quit date: 08/06/2010    Years since quitting: 11.2   Smokeless tobacco: Never  Substance and Sexual Activity   Alcohol use: Yes    Comment: 5-6 beers per week   Drug use: Yes    Types: Marijuana    Comment: couple times a month   Sexual activity: Not on file  Other Topics Concern   Not on file  Social History Narrative   Not on file   Social Determinants of Health   Financial Resource Strain: Not on file  Food Insecurity: No Food Insecurity (12/21/2020)   Hunger Vital Sign    Worried About Running Out of Food in the Last Year: Never true    Ran Out of Food in the Last Year: Never true  Transportation Needs: No Transportation Needs (12/21/2020)   PRAPARE - Hydrologist (Medical): No    Lack of Transportation (Non-Medical): No  Physical Activity: Not on file  Stress: Not on file  Social Connections: Not on file     Family History:  The patient's family history includes Heart disease in his brother, father, and maternal uncle.   ROS:   Please see the history of present illness.    ROS All  other systems are reviewed and are negative.   PHYSICAL EXAM:   VS:  BP 124/64 (BP Location: Left Arm, Patient Position: Sitting, Cuff Size: Large)   Pulse 78   Ht $R'5\' 11"'mt$  (1.803 m)   Wt 221 lb 3.2 oz (100.3 kg)   SpO2 98%   BMI 30.85 kg/m      General: Alert, oriented x3, no distress, the left subclavian pacemaker site Head: no evidence of trauma, PERRL, EOMI, no exophtalmos or lid lag, no myxedema, no xanthelasma; normal ears, nose and oropharynx Neck: 8 cm elevation in jugular venous pulsations and prompt hepatojugular reflux; brisk carotid pulses without delay and no carotid bruits Chest: clear to auscultation, no signs of consolidation by percussion or palpation, normal fremitus, symmetrical and full respiratory excursions Cardiovascular: normal position and quality of the apical impulse, regular rhythm, normal first and paradoxically split second heart sounds, no murmurs, rubs or gallops Abdomen: no tenderness or distention, no masses by palpation, no abnormal pulsatility or arterial bruits, normal bowel sounds, no hepatosplenomegaly Extremities: no clubbing, cyanosis; symmetrical hard pitting 3+ edema of the lower extremities, almost to the knees bilaterally; small weeping wounds anterior left shin and blisters on the left calf and right calf. Neuro logical: Grossly nonfocal, slightly hard of hearing Psych: Normal mood and affect     Wt Readings from Last 3 Encounters:  10/26/21 221 lb 3.2 oz (100.3 kg)  12/21/20 224 lb 3.2 oz (101.7 kg)  10/30/20 222 lb 3.2 oz (100.8 kg)   Studies/Labs Reviewed:   EKG:  EKG is ordered today.  It shows ventricular paced rhythm with background atrial fibrillation.  ECHO 11/23/2020   1. Left ventricular ejection fraction, by estimation, is 35 to 40%. The  left ventricle has moderately decreased function. The left ventricle  demonstrates regional wall motion abnormalities with septal hypokinesis,  inferior hypokinesis, and  septal-lateral  dyssynchrony likely due to RV pacing. There is moderate  left ventricular hypertrophy. Left ventricular diastolic parameters are  consistent with Grade II diastolic dysfunction (pseudonormalization). The  average left ventricular global  longitudinal strain is -13.6 %. The global longitudinal strain is  abnormal.   2. Right ventricular systolic function is moderately reduced. The right  ventricular size is normal. There is normal pulmonary artery systolic  pressure. The estimated right ventricular systolic pressure is 63.8 mmHg.   3. Left atrial size was mildly dilated.   4. Right atrial size was mildly dilated.   5. The mitral valve is normal in structure. Mild mitral valve  regurgitation. No evidence of mitral stenosis.   6. The aortic valve is tricuspid. Aortic valve regurgitation is not  visualized. Mild aortic valve sclerosis is present, with no evidence of  aortic valve stenosis.   7. Aortic dilatation noted. There is moderate dilatation of the ascending  aorta, measuring 47 mm.   8. The inferior vena cava is dilated in size with >50% respiratory  variability, suggesting right atrial pressure of 8 mmHg.   Recent Labs: 10/30/2020: BNP 293.4; Hemoglobin 10.0; Platelets 270 01/01/2021: BUN 15; Creatinine, Ser 1.37; Potassium 4.5; Sodium 142  10/06/2021 Creatinine 1.63, potassium 4.7 Lipid Panel Jan 2019 LDL 78 Lipid Panel     Component Value Date/Time   CHOL 176 09/10/2010 0623   TRIG 94 09/10/2010 0623   HDL 50 09/10/2010 0623   CHOLHDL 3.5 09/10/2010 0623   VLDL 19 09/10/2010 0623   LDLCALC 107 (H) 09/10/2010 0623  05/28/2021 Cholesterol 99, HDL 41, LDL 40, triglycerides 95  ASSESSMENT:    1. Acute on chronic systolic (congestive) heart failure ([place])   2. Permanent atrial fibrillation ([place])   3. Acquired thrombophilia ([place])   4. Coronary artery disease involving coronary bypass graft of native heart without angina pectoris   5. Essential hypertension   6. Chronic  obstructive pulmonary disease, unspecified COPD type ([place])   7. CHB (complete heart block) (HCC)   8. Pacemaker   9. Dyslipidemia (high LDL; low HDL)   10. Diabetes mellitus due to underlying condition with stage 3b chronic kidney disease, with long-term current use of insulin ([place])   11. Stage 3b chronic kidney disease ([place])       PLAN:  In order of problems listed above:  CHF:  Most recent LVEF by echocardiogram Sept 2022 35-40%, reduced from years past.  Hypervolemic today, primarily with findings of right heart failure.  Also has exertional dyspnea.On metoprolol and hydralazine, low dose losartan. Had issues with worsening renal function throughout 2020.  Consider CRT, although we need to try to optimize his medical therapy first.  We will add SGLT2 inhibitor today.  Reduce the dose of Lantus insulin in half.  Continue his diuretic for the time being until he achieves euvolemia. Longstanding persistent atrial flutter/fibrillation: On anticoagulation.  Arrhythmia unaware.  CHA2DS2-VASc 6 (age 89, CAD, HTN, DM).  Has failed multiple attempts at maintenance of sinus rhythm including use of amiodarone. Anticoagulation: No bleeding problems on full dose apixaban.  His most recent creatinine was over the threshold of 1.5 and he is over 75 years old.  May need to decrease his dose of Eliquis if his creatinine remains at this level. CAD s/p CABG: Denies angina, has occluded native arteries, graft dependent (all grafts patent at the cardiac catheterization 2017).  May need to reevaluate his coronary grafts.  The risk of contrast-induced nephrotoxicity is not trivial though.  We will probably start with a nuclear perfusion study, preferably a PET scan. HTN: Well-controlled COPD: Currently without problems with wheezing.  He did have issues on carvedilol and does better on a more selective agent such as metoprolol. CHB: Pacemaker dependent.  Does have an idioventricular escape rhythm, but this may not be  reliable PPM: Candidate for CRT upgrade since he now has overt heart failure and EF has deteriorated.  However, in view of his age and numerous comorbid conditions this may not be a low risk procedure. HLP: Excellent LDL cholesterol at 40 on rosuvastatin.  HDL borderline low. DM: Adequate control, most recent hemoglobin A1c 7.2%.  Reduce the dose of Lantus in half when he starts [place]. CKD 3b: Previous baseline seems to be around 1.5 with a GFR of about 40.  Most recent creatinine 1.63 on August 2.   Medication Adjustments/Labs and Tests Ordered: Current medicines are reviewed at length with the patient today.  Concerns regarding medicines are outlined above.  Medication changes, Labs and Tests ordered today are listed in the Patient Instructions below. Patient Instructions  Medication Instructions:   STOP LANTUS DAILY   ONLY TAKE LANTUS 20 UNITS AT BEDTIME FOR BLOOD SUGAR GREATER THAN 200  START: FARXIGA (DAPAGLIFLOZIN) 10MG  ONCE DAILY BEFORE BREAKFAST/WITH BREAKFAST  *If you need a refill on your cardiac medications before your next appointment, please call your pharmacy*  Lab Work: None Ordered At This Time.  If you have labs (blood work) drawn today and your tests are completely normal, you will receive your results only by: [place] (if you have MyChart) OR A paper copy in the mail If you have any lab test that is abnormal or we need to change your treatment, we will call you to review the results.  Testing/Procedures: None Ordered At This Time.   Follow-Up: At [facility], you and your health needs are our priority.  As part of our continuing mission to provide you with exceptional heart care, we have created designated Provider Care Teams.  These Care Teams include your primary Cardiologist (physician) and Advanced Practice Providers (APPs -  Physician Assistants and Nurse Practitioners) who all work together to provide you with the care you need, when you need  it.  We recommend signing up for the patient portal called "MyChart".  Sign up information is provided on this After Visit Summary.  MyChart is used to connect with patients for Virtual Visits (Telemedicine).  Patients are able to view lab/test results, encounter notes, upcoming appointments, etc.  Non-urgent messages can be sent to your provider as well.   To learn more about what you can do with MyChart, go to [email or web address].    Your next appointment:   4 week(s)  The format for your next appointment:   In Person  Provider:   Sande Rives, PA-C    Then, Jesus Klein, MD will plan to see you again in 3 month(s).           Signed, Jesus Klein, MD  10/28/2021 5:15 PM    [facility] [address], [place], [place]  [address] Phone: [phone]; Fax: [phone]

## 2021-10-26 NOTE — Patient Instructions (Signed)
Medication Instructions:   STOP LANTUS DAILY   ONLY TAKE LANTUS 20 UNITS AT BEDTIME FOR BLOOD SUGAR GREATER THAN 200  START: FARXIGA (DAPAGLIFLOZIN) 10MG  ONCE DAILY BEFORE BREAKFAST/WITH BREAKFAST  *If you need a refill on your cardiac medications before your next appointment, please call your pharmacy*  Lab Work: None Ordered At This Time.  If you have labs (blood work) drawn today and your tests are completely normal, you will receive your results only by: [place] (if you have MyChart) OR A paper copy in the mail If you have any lab test that is abnormal or we need to change your treatment, we will call you to review the results.  Testing/Procedures: None Ordered At This Time.   Follow-Up: At [facility], you and your health needs are our priority.  As part of our continuing mission to provide you with exceptional heart care, we have created designated Provider Care Teams.  These Care Teams include your primary Cardiologist (physician) and Advanced Practice Providers (APPs -  Physician Assistants and Nurse Practitioners) who all work together to provide you with the care you need, when you need it.  We recommend signing up for the patient portal called "MyChart".  Sign up information is provided on this After Visit Summary.  MyChart is used to connect with patients for Virtual Visits (Telemedicine).  Patients are able to view lab/test results, encounter notes, upcoming appointments, etc.  Non-urgent messages can be sent to your provider as well.   To learn more about what you can do with MyChart, go to [email or web address].    Your next appointment:   4 week(s)  The format for your next appointment:   In Person  Provider:   Sande Rives, PA-C    Then, Sanda Klein, MD will plan to see you again in 3 month(s).

## 2021-10-29 ENCOUNTER — Ambulatory Visit (HOSPITAL_COMMUNITY)
Admission: RE | Admit: 2021-10-29 | Discharge: 2021-10-29 | Disposition: A | Discharge status: Home / Self Care | Payer: Medicare Other | Source: Ambulatory Visit | Attending: Student | Admitting: Student

## 2021-11-18 ENCOUNTER — Telehealth: Payer: Self-pay | Admitting: Cardiovascular Disease

## 2021-11-18 NOTE — Telephone Encounter (Signed)
   Pt's daughter calling, she would like to speak with Dr. Lurline Del nurse to get more info about the visit the pt had with Dr. Loletha Grayer on 10/26/21. She said, she needs more info about the procedure and medication changes

## 2021-11-18 NOTE — Telephone Encounter (Signed)
I spoke with daughter,[name], and discussed Dr.Croitoru's note from office visit 10/26/21. We discussed his medication changes, stopping daily Lantus and only giving 20 units HS if glucose is over 200, starting [place] for heart failure and the possible inclusion of CRT if medical management fails. Daughter plans to have her dad see a hematologist for his anemia.   She reports his weigh has dropped to 216 lbs and she was pleased.She asks that pcp get copy of 8/22 office note.

## 2021-11-23 ENCOUNTER — Other Ambulatory Visit: Payer: Self-pay | Admitting: Student

## 2021-11-27 NOTE — Progress Notes (Addendum)
Cardiology Office Note:    Date:  12/01/2021   ID:  YASSEEN SALLS, DOB 20-Dec-1933, MRN [id number]  PCP:  Vernie Shanks, MD (Inactive)  Cardiologist:  Sanda Klein, MD  Electrophysiologist:  None   Referring MD: No ref. provider found   Chief Complaint: hospital follow-up of CHF  History of Present Illness:    Jesus Oconnor is a 88 y.o. male with a history of CAD s/p CABG x5 in 09/2010, chronic combined CHF, paroxysmal atrial flutter and persistent atrial fibrillation on Eliquis, complete heart block s/p Medtronic dual chamber PPM in 2014 (pacemaker dependent), ascending thoracic aortic aneurysm, COPD, hypertension, hyperlipidemia, type 2 diabetes mellitus who is followed by Dr. Sallyanne Kuster and presents today for follow-up of CHF.  Patient has a history of CAD and underwent CABG x5 in 09/2010. Echo in 07/2015 showed LVEF of 30-35% with diffuse hypokinesis and grade 2 diastolic dysfunction. Subsequent right/left heart catheterization in 08/2015 showed severe native CAD but all grafts were patent. Medical therapy was recommended. Last Echo in 11/2020 showed LVEF of 35-40% with hypokinesis of the septal and inferior walls as well as grade 2 diastolic dysfunction and septa-lateral dyssynchrony likely due to RV pacing. Also showed moderately reduced RV, mild biatrial enlargement, mild MR, and moderate dilatation of the ascending aorta measuring 47 mm. Chest CTA in 12/2020 showed ectatic ascending thoracic aorta measuring 4.5 cm. He has been in persistent atrial fibrillation since 03/2019 and his PPM is programmed VVIR. Patient was recently seen by Dr. Sallyanne Kuster on 10/26/2021 at which time he reported worsening lower extremity edema leading to small blister on both legs and weeping wounds. He also reported increased shortness of breath on exertion but no orthopnea or PND. Pacemaker interrogation showed virtually 100% ventricular pacing (98.7% with underlying idioventricular escape rhythm). He was volume  overloaded on exam with primarily signs of right heart failure. He was started on Farxiga.  Patient presents today for follow-up.  Here with daughter.  He is doing relatively well since last office visit.  He has been taking a higher dose of Lasix (80 mg twice daily) and this dose helped a lot with his lower extremity swelling.  He still has some swelling on exam but he states this is much better than before.  He reports some chronic dyspnea on exertion when he is walking uphill but states this is not new and is stable.  He denies any shortness of breath when ambulating around his home.  No new orthopnea or PND.  He does states that he will wake up coughing at night but it sound like this is due to some postnasal drainage. His weights are stable at home and are normally around 217 to 220 lbs. He states he has actually been losing some weight. He is down 2 lbs in the office today compared to last visit.   He denies any chest pain, palpitations, lightheadedness, dizziness, syncope.  Daughter states that he has been started on iron due to low iron level.  No obvious bleeding in her stools.  Past Medical History:  Diagnosis Date   Anemia    history of   Arthritis    Atrial flutter (HCC)    severe bradycardia   CHF (congestive heart failure) (HCC)    Constipation    COPD (chronic obstructive pulmonary disease) (HCC)    Coronary artery disease    Cough    Diarrhea    Diastolic dysfunction    Echo 06/10/5033-[facility] I diastolic dysfunction,EF 12-75%  DM2 (diabetes mellitus, type 2) (HCC)    Dyslipidemia    History of kidney stones    HTN (hypertension)    Hyperlipidemia    Leg swelling    Mobitz type 1 second degree atrioventricular block    Pacemaker 05/22/2012   Dr  Croitoru    Medtronic Adapta   S/P CABG x 5 09/13/2010   LIMA to LAD,SVG to obtuse marginal,seq. SVG to firts & second diagonals,SVG to forth posterolateral branch to RCA   Sore throat    Wheezing     Past Surgical History:   Procedure Laterality Date   CABG x5  09/13/2010   Dr Farrel Conners to LAD,SVG to obtuse marginal,seq. SVG to first & second diagonals,SVG to fourth posterolateral branch RCA   CARDIAC CATHETERIZATION  09/10/2010   severe LAD,D1,D2 & ostial D2   CARDIAC CATHETERIZATION N/A 08/28/2015   Procedure: Right/Left Heart Cath and Coronary/Graft Angiography;  Surgeon: Troy Sine, MD;  Location: [facility];  Service: Cardiovascular;  Laterality: N/A;   CHOLECYSTECTOMY  october 2012   CORONARY ARTERY BYPASS GRAFT     INSERT / REPLACE / REMOVE PACEMAKER  05/22/2012    dual chamber   PERMANENT PACEMAKER INSERTION N/A 05/22/2012   Procedure: PERMANENT PACEMAKER INSERTION;  Surgeon: Sanda Klein, MD;  Location: [facility];  Service: Cardiovascular;  Laterality: N/A;   repair of left indirect inguinal hernia with mesh.     repair of umbilical hernia.     TOTAL KNEE ARTHROPLASTY Right 09/26/2016   Procedure: TOTAL KNEE ARTHROPLASTY;  Surgeon: Frederik Pear, MD;  Location: [facility];  Service: Orthopedics;  Laterality: Right;    Current Medications: Current Meds  Medication Sig   apixaban (ELIQUIS) 5 MG TABS tablet Take 1 tablet (5 mg total) by mouth 2 (two) times daily.   Cholecalciferol (VITAMIN D3) 125 MCG (5000 UT) TABS Take 5,000 Units by mouth daily.   Cyanocobalamin (B-12) 1000 MCG/ML KIT Inject 1 Dose as directed every 30 (thirty) days.    dapagliflozin propanediol (FARXIGA) 10 MG TABS tablet Take 1 tablet (10 mg total) by mouth daily before breakfast.   ferrous sulfate 325 (65 FE) MG tablet Take 325 mg by mouth daily with breakfast.   fluticasone furoate-vilanterol (BREO ELLIPTA) 200-25 MCG/INH AEPB Inhale 1 puff into the lungs 2 (two) times daily.   furosemide (LASIX) 40 MG tablet Take 2 tablets (80 mg total) by mouth daily. (Patient taking differently: Take 80 mg by mouth 2 (two) times daily.)   LANTUS SOLOSTAR 100 UNIT/ML Solostar Pen Inject 20 Units into the skin at bedtime. ONLY TAKE IF  BLOOD SUGAR IS GREATER THAN 200 (Patient taking differently: Inject 35 Units into the skin at bedtime. ONLY TAKE IF BLOOD SUGAR IS GREATER THAN 200)   levothyroxine (SYNTHROID) 75 MCG tablet Take 75 mcg by mouth daily.   losartan (COZAAR) 25 MG tablet TAKE 1 TABLET BY MOUTH  DAILY   metoprolol succinate (TOPROL-XL) 50 MG 24 hr tablet TAKE 1 TABLET BY MOUTH DAILY   ONETOUCH VERIO test strip daily. As directed   potassium chloride SA (KLOR-CON M) 20 MEQ tablet TAKE 1 TABLET(20 MEQ) BY MOUTH DAILY   rosuvastatin (CRESTOR) 40 MG tablet Take 40 mg by mouth daily.   traZODone (DESYREL) 50 MG tablet Take 50 mg by mouth at bedtime as needed for sleep.    WIXELA INHUB 250-50 MCG/DOSE AEPB Inhale 1 puff into the lungs 2 (two) times a day.     Allergies:  No known allergies   Social History   Socioeconomic History   Marital status: Widowed    Spouse name: Not on file   Number of children: 2   Years of education: Not on file   Highest education level: Not on file  Occupational History   Not on file  Tobacco Use   Smoking status: Former    Years: 40.00    Types: Cigarettes    Quit date: 08/06/2010    Years since quitting: 11.3   Smokeless tobacco: Never  Substance and Sexual Activity   Alcohol use: Yes    Comment: 5-6 beers per week   Drug use: Yes    Types: Marijuana    Comment: couple times a month   Sexual activity: Not on file  Other Topics Concern   Not on file  Social History Narrative   Not on file   Social Determinants of Health   Financial Resource Strain: Not on file  Food Insecurity: No Food Insecurity (12/21/2020)   Hunger Vital Sign    Worried About Running Out of Food in the Last Year: Never true    Ran Out of Food in the Last Year: Never true  Transportation Needs: No Transportation Needs (12/21/2020)   PRAPARE - Hydrologist (Medical): No    Lack of Transportation (Non-Medical): No  Physical Activity: Not on file  Stress: Not on file   Social Connections: Not on file     Family History: The patient's family history includes Heart disease in his brother, father, and maternal uncle.  ROS:   Please see the history of present illness.     EKGs/Labs/Other Studies Reviewed:    The following studies were reviewed:  Right/Left Cardiac Catheterization 08/28/2015: Ost LAD lesion, 60% stenosed. Ost LAD to Prox LAD lesion, 70% stenosed. SVG is moderate in size. The graft exhibits minimal luminal irregularities. SVG . Origin lesion, 40% stenosed. Prox Cx lesion, 100% stenosed. SVG . Prox Graft lesion, before 1st Diag, 30% stenosed. The SVG supplying the distal RCA had mild luminal irregularity but was free of significant stenosis. The SVG supplying the distal marginal vessel was patent. There appeared to be at 30-40% narrowing proximally in the region of the valve. The sequential vein graft supplying the first and second diagonal vessel was patent. There was a 30% proximal area of narrowing which appeared to be region of the valve. LIMA is normal in caliber, and is anatomically normal. The LIMA graft was widely patent and anastomosed into the mid LAD. There is mild left ventricular systolic dysfunction. Ost LM lesion, 20% stenosed.     Mild global LV dysfunction with an ejection fraction of 45-50%.  There is significant mitral annular calcification and trace mitral regurgitation.   Mild pulmonary hypertension.   Significant native CAD with 20% mid left main narrowing; diffuse 60-70% proximal and 70% mid LAD stenoses; total occlusion of the proximal circumflex coronary artery with faint antegrade collaterals; and diffusely irregular RCA with mild calcification without apparent high-grade stenosis.   Patent LIMA graft supplying the mid LAD.   Patent sequential vein graft supplying the diagonal 1 and diagonal 2 vessel with mild 30% narrowing in the region of a valve in the proximal portion of the graft.   Patent vein  graft supplying the distal circumflex marginal vessel with smooth 40% narrowing proximally in the region of a valve.   Patent vein graft supplying the distal posterolateral branch of the RCA.   Recommendations: Medical  therapy. _______________  Echocardiogram 11/23/2020: Impressions:  1. Left ventricular ejection fraction, by estimation, is 35 to 40%. The  left ventricle has moderately decreased function. The left ventricle  demonstrates regional wall motion abnormalities with septal hypokinesis,  inferior hypokinesis, and  septal-lateral dyssynchrony likely due to RV pacing. There is moderate  left ventricular hypertrophy. Left ventricular diastolic parameters are  consistent with Grade II diastolic dysfunction (pseudonormalization). The  average left ventricular global  longitudinal strain is -13.6 %. The global longitudinal strain is  abnormal.   2. Right ventricular systolic function is moderately reduced. The right  ventricular size is normal. There is normal pulmonary artery systolic  pressure. The estimated right ventricular systolic pressure is 59.5 mmHg.   3. Left atrial size was mildly dilated.   4. Right atrial size was mildly dilated.   5. The mitral valve is normal in structure. Mild mitral valve  regurgitation. No evidence of mitral stenosis.   6. The aortic valve is tricuspid. Aortic valve regurgitation is not  visualized. Mild aortic valve sclerosis is present, with no evidence of  aortic valve stenosis.   7. Aortic dilatation noted. There is moderate dilatation of the ascending  aorta, measuring 47 mm.   8. The inferior vena cava is dilated in size with >50% respiratory  variability, suggesting right atrial pressure of 8 mmHg. _______________  Chest CTA 12/24/2020: Impressions: 1. Ectatic ascending thoracic aorta, measuring 4.5 cm. Recommend semi-annual imaging followup by CTA or MRA and referral to cardiothoracic surgery if not already obtained. This  recommendation follows 2010 ACCF/AHA/AATS/ACR/ASA/SCA/SCAI/SIR/STS/SVM Guidelines for the Diagnosis and Management of Patients With Thoracic Aortic Disease. Circulation. 2010; 121: G387-F643. Aortic aneurysm NOS (ICD10-I71.9) 2. LEFT subclavian pacer with intact dual leads 3. Additional chronic and senescent changes as above.    EKG:  EKG not ordered today.   Recent Labs: 01/01/2021: BUN 15; Creatinine, Ser 1.37; Potassium 4.5; Sodium 142  Recent Lipid Panel    Component Value Date/Time   CHOL 176 09/10/2010 0623   TRIG 94 09/10/2010 0623   HDL 50 09/10/2010 0623   CHOLHDL 3.5 09/10/2010 0623   VLDL 19 09/10/2010 0623   LDLCALC 107 (H) 09/10/2010 0623    Physical Exam:    Vital Signs: BP (!) 100/50 (BP Location: Left Arm, Patient Position: Sitting, Cuff Size: Normal)   Pulse 87   Ht _0  (1.803 m)   Wt 219 lb (99.3 kg)   SpO2 97%   BMI 30.54 kg/m     Wt Readings from Last 3 Encounters:  12/01/21 219 lb (99.3 kg)  10/26/21 221 lb 3.2 oz (100.3 kg)  12/21/20 224 lb 3.2 oz (101.7 kg)     General: 88 y.o. Caucasian male in no acute distress. HEENT: Normocephalic and atraumatic. Sclera clear.  Neck: Supple. No carotid bruits. No JVD. Heart: RRR. Distinct S1 and S2. No murmurs, gallops, or rubs.  Lungs: No increased work of breathing. Clear to ausculation bilaterally. No wheezes, rhonchi, or rales.  Abdomen: Soft, non-distended, and non-tender to palpation.  Extremities: 1+ pitting edema of bilateral lower extremities (right chronically > left).  Skin: Warm and dry. Neuro: Alert and oriented x3. No focal deficits. Psych: Normal affect. Responds appropriately.  Assessment:    1. Chronic combined systolic and diastolic CHF (congestive heart failure) ([place])   2. Coronary artery disease involving native coronary artery of native heart without angina pectoris   3. S/P CABG (coronary artery bypass graft)   4. Persistent atrial fibrillation ([place])   5.  Paroxysmal atrial  fibrillation (HCC)   6. Heart block AV complete ([place])   7. S/P placement of cardiac pacemaker   8. Aneurysm of ascending aorta without rupture ([place])   9. Primary hypertension   10. Hyperlipidemia, unspecified hyperlipidemia type   11. Type 2 diabetes mellitus with complication, with long-term current use of insulin ([place])   12. Stage 3 chronic kidney disease, unspecified whether stage 3a or 3b CKD ([place])     Plan:    Chronic Combined CHF Last Echo in 11/2020 showed LVEF of 35-40% with hypokinesis of the septal and inferior walls as well as grade 2 diastolic dysfunction and septa-lateral dyssynchrony likely due to RV pacing. Also showed moderately reduced RV, mild biatrial enlargement, and mild MR. He was seen last month and reported worsening lower extremity edema and dyspnea on exertion. He was volume overloaded on exam with primarily findings of right sided heart failure and was started on SGLT2 inhibitor.  - He has some mild lower extremity edema but otherwise appears euvolemic. Weight down 2 lbs from visit last month. - He has Lasix 80mg  listed under home medications but states he has been taking 80mg  twice daily which seems to be helping more with his edema. OK to continue current dose. - Continue Losartan 25mg  daily - Continue Toprol-XL 50mg  daily. - Continue Hydralazine 25mg  three times daily. - Continue Farxiga 10mg  daily. - Discussed importance of daily weight and sodium/fluid restrictions.  - Will repeat BMET and Echo today.   CAD s/p CABG x5 in 09/2010 Last cardiac catheterization in 2017 showed patent grafts.  - No chest pain. - Not on aspirin due to need for DOAC.  - Continue beta-blocker and high-intensity statin.  - Per Dr. Victorino December last note: "We may need to re-evaluate his coronary grafts). The risk of contrast-induced nephrotoxicity is not trivial though.  We will probably start with a nuclear perfusion study, preferably a PET scan." Given no chest pain and improvement in  symptoms with increased dose of Lasix, will wait for Echo results before deciding on repeat ischemic evaluation.   Persistent Atrial Fibrillation Paroxysmal Atrial Flutter Previously on Amiodarone but discontinued in 2021 with plan to just focus on rate control.  - Rate controlled.  - Continue beta-blocker as above.  - Continue chronic anti-coagulation with Eliquis 5mg  twice daily. Will repeat BMET today. If creatinine still >1.5, will need to decrease Eliquis to 2.5mg  twice daily.   Complete Heart Block s/p Medtronic Dual-Chamber PPM Pacemaker dependent. Device interrogation last month showed virtually 100% ventricular pacing. - Per Dr. Victorino December last note, he is a candidate for CRT upgrade since he now has overt heart failure and EF has deteriorated. However, this may not be a low risk procedure given age and numerous comorbid conditions. Will order repeat Echo as above and then patient has follow-up with Dr. Sallyanne Kuster in 01/2022.  Ascending Thoracic Aortic Aneurysm Echo in 11/2020 showed a moderate dilatation of the ascending aorta measuring 47 mm. Chest CTA in 12/2020 confirmed an ectatic ascending thoracic aorta measuring 45 cm. Repeat CTA was ordered for 06/2021 and he was referred to CT surgery but it does not look like either one of these happened.  - Patient does not think he would every want to have surgery for this (and with his advanced age and other comorbidiities, I don't know if he would even be a candidate). Given this, will hold off on repeat chest CTA for now and reassess on Echo.   Hypertension BP well controlled.  Initially soft in the office at 100/50 but improved to 118/62 on my recheck at the end of visit. Asymptomatic with this. - Continue medications for CHF as above. - Asked patient and daughter to keep BP/HR log for 1 week and then send this to [place]. If BP and renal function allows, may increase Losartan or switch to Entresto.   Hyperlipidemia Last lipid panel from 05/2021  (per [facility]): Total Cholesterol 99, Triglycerides 95, HDL 41, LDL 40.  LDL goal <70 given CAD.  - Continue Crestor 81m daily.   Type 2 Diabetes Mellitus Most recent hemoglobin A1c 7.2% in 10/2021. - On Farxiga and Insulin (Lantus).  - Management per PCP.   CKD Stage III Baseline creatinine 1.4 to 1.6.  - Will recheck BMET today.  Disposition: Keep follow-up with Dr. CSallyanne Kusterin 01/2022.   Medication Adjustments/Labs and Tests Ordered: Current medicines are reviewed at length with the patient today.  Concerns regarding medicines are outlined above.  Orders Placed This Encounter  Procedures   Basic metabolic panel   ECHOCARDIOGRAM COMPLETE   No orders of the defined types were placed in this encounter.   Patient Instructions  Medication Instructions:  Your physician recommends that you continue on your current medications as directed. Please refer to the Current Medication list given to you today.   *If you need a refill on your cardiac medications before your next appointment, please call your pharmacy*   Lab Work: Your physician recommends that you complete lab work today BMET  If you have labs (blood work) drawn today and your tests are completely normal, you will receive your results only by: MyChart Message (if you have MyChart) OR A paper copy in the mail If you have any lab test that is abnormal or we need to change your treatment, we will call you to review the results.   Testing/Procedures: Your physician has requested that you have an echocardiogram. Echocardiography is a painless test that uses sound waves to create images of your heart. It provides your doctor with information about the size and shape of your heart and how well your heart's chambers and valves are working. This procedure takes approximately one hour. There are no restrictions for this procedure.    Follow-Up: [facility] you and your health needs are our priority.  As part of our  continuing mission to provide you with exceptional heart care, we have created designated Provider Care Teams.  These Care Teams include your primary Cardiologist (physician) and Advanced Practice Providers (APPs -  Physician Assistants and Nurse Practitioners) who all work together to provide you with the care you need, when you need it.  We recommend signing up for the patient portal called "MyChart".  Sign up information is provided on this After Visit Summary.  MyChart is used to connect with patients for Virtual Visits (Telemedicine).  Patients are able to view lab/test results, encounter notes, upcoming appointments, etc.  Non-urgent messages can be sent to your provider as well.   To learn more about what you can do with MyChart, go to [email or web address]    Your next appointment:    Keep follow up appointment   The format for your next appointment:   In Person  Provider:   MSanda Klein MD     Other Instructions   Important Information About Sugar         Signed, CEppie Gibson 12/01/2021 12:46 PM    [place]

## 2021-12-01 ENCOUNTER — Ambulatory Visit: Payer: Medicare Other | Attending: Student | Admitting: Student

## 2021-12-01 ENCOUNTER — Encounter: Payer: Self-pay | Admitting: Student

## 2021-12-01 VITALS — BP 100/50 | HR 87 | Ht 71.0 in | Wt 219.0 lb

## 2021-12-01 DIAGNOSIS — I10 Essential (primary) hypertension: Secondary | ICD-10-CM

## 2021-12-01 DIAGNOSIS — E118 Type 2 diabetes mellitus with unspecified complications: Secondary | ICD-10-CM

## 2021-12-01 DIAGNOSIS — I480 Paroxysmal atrial fibrillation: Secondary | ICD-10-CM

## 2021-12-01 DIAGNOSIS — N183 Chronic kidney disease, stage 3 unspecified: Secondary | ICD-10-CM

## 2021-12-01 DIAGNOSIS — Z951 Presence of aortocoronary bypass graft: Secondary | ICD-10-CM | POA: Diagnosis not present

## 2021-12-01 DIAGNOSIS — I5042 Chronic combined systolic (congestive) and diastolic (congestive) heart failure: Secondary | ICD-10-CM | POA: Diagnosis not present

## 2021-12-01 DIAGNOSIS — I442 Atrioventricular block, complete: Secondary | ICD-10-CM

## 2021-12-01 DIAGNOSIS — I4819 Other persistent atrial fibrillation: Secondary | ICD-10-CM

## 2021-12-01 DIAGNOSIS — Z794 Long term (current) use of insulin: Secondary | ICD-10-CM

## 2021-12-01 DIAGNOSIS — Z950 Presence of cardiac pacemaker: Secondary | ICD-10-CM

## 2021-12-01 DIAGNOSIS — I7121 Aneurysm of the ascending aorta, without rupture: Secondary | ICD-10-CM

## 2021-12-01 DIAGNOSIS — I2510 Atherosclerotic heart disease of native coronary artery without angina pectoris: Secondary | ICD-10-CM | POA: Diagnosis not present

## 2021-12-01 DIAGNOSIS — E785 Hyperlipidemia, unspecified: Secondary | ICD-10-CM

## 2021-12-01 NOTE — Patient Instructions (Signed)
Medication Instructions:  Your physician recommends that you continue on your current medications as directed. Please refer to the Current Medication list given to you today.   *If you need a refill on your cardiac medications before your next appointment, please call your pharmacy*   Lab Work: Your physician recommends that you complete lab work today BMET  If you have labs (blood work) drawn today and your tests are completely normal, you will receive your results only by: MyChart Message (if you have MyChart) OR A paper copy in the mail If you have any lab test that is abnormal or we need to change your treatment, we will call you to review the results.   Testing/Procedures: Your physician has requested that you have an echocardiogram. Echocardiography is a painless test that uses sound waves to create images of your heart. It provides your doctor with information about the size and shape of your heart and how well your heart's chambers and valves are working. This procedure takes approximately one hour. There are no restrictions for this procedure.    Follow-Up: At [facility], you and your health needs are our priority.  As part of our continuing mission to provide you with exceptional heart care, we have created designated Provider Care Teams.  These Care Teams include your primary Cardiologist (physician) and Advanced Practice Providers (APPs -  Physician Assistants and Nurse Practitioners) who all work together to provide you with the care you need, when you need it.  We recommend signing up for the patient portal called "MyChart".  Sign up information is provided on this After Visit Summary.  MyChart is used to connect with patients for Virtual Visits (Telemedicine).  Patients are able to view lab/test results, encounter notes, upcoming appointments, etc.  Non-urgent messages can be sent to your provider as well.   To learn more about what you can do with MyChart, go to  [email or web address].    Your next appointment:    Keep follow up appointment   The format for your next appointment:   In Person  Provider:   Sanda Klein, MD     Other Instructions   Important Information About Sugar

## 2021-12-02 LAB — BASIC METABOLIC PANEL
BUN/Creatinine Ratio: 15 (ref 10–24)
BUN: 23 mg/dL (ref 8–27)
CO2: 24 mmol/L (ref 20–29)
Calcium: 9.8 mg/dL (ref 8.6–10.2)
Chloride: 101 mmol/L (ref 96–106)
Creatinine, Ser: 1.56 mg/dL — ABNORMAL HIGH (ref 0.76–1.27)
Glucose: 133 mg/dL — ABNORMAL HIGH (ref 70–99)
Potassium: 4.1 mmol/L (ref 3.5–5.2)
Sodium: 144 mmol/L (ref 134–144)
eGFR: 43 mL/min/{1.73_m2} — ABNORMAL LOW (ref >59)

## 2021-12-13 ENCOUNTER — Other Ambulatory Visit: Payer: Self-pay | Admitting: Cardiovascular Disease

## 2021-12-13 ENCOUNTER — Other Ambulatory Visit: Payer: Self-pay | Admitting: Student

## 2021-12-21 ENCOUNTER — Other Ambulatory Visit: Payer: Self-pay | Admitting: Cardiovascular Disease

## 2021-12-23 ENCOUNTER — Ambulatory Visit (HOSPITAL_COMMUNITY): Payer: Medicare Other | Attending: Student

## 2021-12-23 DIAGNOSIS — I5042 Chronic combined systolic (congestive) and diastolic (congestive) heart failure: Secondary | ICD-10-CM | POA: Diagnosis present

## 2021-12-23 DIAGNOSIS — I480 Paroxysmal atrial fibrillation: Secondary | ICD-10-CM | POA: Diagnosis present

## 2021-12-23 DIAGNOSIS — I2510 Atherosclerotic heart disease of native coronary artery without angina pectoris: Secondary | ICD-10-CM | POA: Diagnosis not present

## 2021-12-23 DIAGNOSIS — N183 Chronic kidney disease, stage 3 unspecified: Secondary | ICD-10-CM | POA: Insufficient documentation

## 2021-12-23 DIAGNOSIS — E118 Type 2 diabetes mellitus with unspecified complications: Secondary | ICD-10-CM | POA: Diagnosis present

## 2021-12-23 DIAGNOSIS — Z950 Presence of cardiac pacemaker: Secondary | ICD-10-CM | POA: Diagnosis present

## 2021-12-23 DIAGNOSIS — I4819 Other persistent atrial fibrillation: Secondary | ICD-10-CM | POA: Diagnosis not present

## 2021-12-23 DIAGNOSIS — I10 Essential (primary) hypertension: Secondary | ICD-10-CM | POA: Diagnosis present

## 2021-12-23 DIAGNOSIS — E785 Hyperlipidemia, unspecified: Secondary | ICD-10-CM | POA: Insufficient documentation

## 2021-12-23 DIAGNOSIS — I442 Atrioventricular block, complete: Secondary | ICD-10-CM | POA: Insufficient documentation

## 2021-12-23 DIAGNOSIS — Z951 Presence of aortocoronary bypass graft: Secondary | ICD-10-CM | POA: Diagnosis not present

## 2021-12-23 DIAGNOSIS — Z794 Long term (current) use of insulin: Secondary | ICD-10-CM | POA: Diagnosis present

## 2021-12-23 DIAGNOSIS — I7121 Aneurysm of the ascending aorta, without rupture: Secondary | ICD-10-CM | POA: Diagnosis present

## 2021-12-23 LAB — ECHOCARDIOGRAM COMPLETE
Area-P 1/2: 4.49 cm2
S' Lateral: 4 cm

## 2021-12-27 ENCOUNTER — Encounter: Payer: Self-pay | Admitting: Cardiovascular Disease

## 2021-12-27 ENCOUNTER — Ambulatory Visit: Payer: Medicare Other | Attending: Cardiovascular Disease | Admitting: Cardiovascular Disease

## 2021-12-27 VITALS — BP 110/64 | HR 61 | Ht 71.0 in | Wt 215.4 lb

## 2021-12-27 DIAGNOSIS — N1832 Chronic kidney disease, stage 3b: Secondary | ICD-10-CM

## 2021-12-27 DIAGNOSIS — I5042 Chronic combined systolic (congestive) and diastolic (congestive) heart failure: Secondary | ICD-10-CM | POA: Diagnosis not present

## 2021-12-27 DIAGNOSIS — D6869 Other thrombophilia: Secondary | ICD-10-CM

## 2021-12-27 DIAGNOSIS — I442 Atrioventricular block, complete: Secondary | ICD-10-CM

## 2021-12-27 DIAGNOSIS — I2510 Atherosclerotic heart disease of native coronary artery without angina pectoris: Secondary | ICD-10-CM

## 2021-12-27 DIAGNOSIS — Z950 Presence of cardiac pacemaker: Secondary | ICD-10-CM

## 2021-12-27 DIAGNOSIS — E785 Hyperlipidemia, unspecified: Secondary | ICD-10-CM

## 2021-12-27 DIAGNOSIS — D500 Iron deficiency anemia secondary to blood loss (chronic): Secondary | ICD-10-CM

## 2021-12-27 DIAGNOSIS — I10 Essential (primary) hypertension: Secondary | ICD-10-CM

## 2021-12-27 DIAGNOSIS — I4819 Other persistent atrial fibrillation: Secondary | ICD-10-CM | POA: Diagnosis not present

## 2021-12-27 DIAGNOSIS — E118 Type 2 diabetes mellitus with unspecified complications: Secondary | ICD-10-CM

## 2021-12-27 DIAGNOSIS — J449 Chronic obstructive pulmonary disease, unspecified: Secondary | ICD-10-CM

## 2021-12-27 DIAGNOSIS — I7121 Aneurysm of the ascending aorta, without rupture: Secondary | ICD-10-CM

## 2021-12-27 DIAGNOSIS — Z794 Long term (current) use of insulin: Secondary | ICD-10-CM

## 2021-12-27 MED ORDER — FUROSEMIDE 40 MG PO TABS: 80.0000 mg | ORAL_TABLET | Freq: Every day | ORAL | 0 refills | Status: DC

## 2021-12-27 NOTE — Progress Notes (Signed)
Patient ID: Jesus Oconnor, male   DOB: 03/20/1933, 88 y.o.   MRN: [id number]     Cardiology Office Note    Date:  12/29/2021   ID:  Jesus Oconnor, DOB April 13, 1933, MRN [id number]  PCP:  Vernie Shanks, MD (Inactive)  Cardiologist:   Sanda Klein, MD   Chief Complaint  Patient presents with   Follow-up    CHF, CAD, AV block, pacemaker     History of Present Illness:  Jesus Oconnor is a 88 y.o. 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  He has been in persistent atrial fibrillation since January 2021 and the device is programmed VVIR. He is asymptomatic and compliant with anticoagulation.  Earlier this year he developed lower extremity edema with weeping wounds.  This has improved after adjusting the dose of diuretics and starting treatment with [place].  We have established a "dry weight" of about 220 pounds on his home scale.  Today at home he weighed around 217 pounds.  In the office he weighs 215.5 pounds.  He has not had any dizziness, palpitations or syncope.  He denies orthopnea or PND.  He has not had chest discomfort at rest or with activity.  NYHA functional class II exertional dyspnea.  Now feels better when he has to walk to the mailbox and does not have to stop to catch his breath.  Follow-up echocardiography shows that his EF has actually improved to about 40-45%.  His labs showed substantial anemia with a hemoglobin of 8.2.  Repeating labs today including iron studies.  He does not take his insulin on a daily basis.  He adjust the dose of insulin to his blood sugar level.  He typically sees a morning glucose level of 80-120 and evening levels no higher than 180.  He had  right total knee replacement for severe arthritis.  This is also the site of his saphenous vein harvest for his previous bypass. The right calf often swells a little more than the left.    In 2017, he developed edema and shortness of breath and echocardiography showed an EF had dropped to 30-35 %. On 08/28/2015 he underwent right and left heart catheterization. Estimated LVEF was better at 45-50 %. He had severe native coronary disease, but all his bypasses (LIMA to LAD, SVG sequential diagonal 1-diagonal 2, SVG to distal circumflex, SVG to PLA branch of RCA) were widely patent. Medical therapy was recommended.  Echo EF improved to 40-45% 12/23/2021 Past Medical History:  Diagnosis Date   Anemia    history of   Arthritis    Atrial flutter (HCC)    severe bradycardia   CHF (congestive heart failure) (HCC)    Constipation    COPD (chronic obstructive pulmonary disease) (HCC)    Coronary artery disease    Cough    Diarrhea    Diastolic dysfunction    Echo 09/13/4799-KPVVZ I diastolic dysfunction,EF 48-27%   DM2 (diabetes mellitus, type 2) (HCC)    Dyslipidemia    History of kidney stones    HTN (hypertension)    Hyperlipidemia    Leg swelling    Mobitz type 1 second degree atrioventricular block    Pacemaker 05/22/2012   Dr  Felicia Bloomquist    Medtronic Adapta   S/P CABG x 5  09/13/2010   LIMA to LAD,SVG to obtuse marginal,seq. SVG to firts & second diagonals,SVG to forth posterolateral branch to RCA   Sore throat    Wheezing     Past Surgical History:  Procedure Laterality Date   CABG x5  09/13/2010   Dr Farrel Conners to LAD,SVG to obtuse marginal,seq. SVG to first & second diagonals,SVG to fourth posterolateral branch RCA   CARDIAC CATHETERIZATION  09/10/2010   severe LAD,D1,D2 & ostial D2   CARDIAC CATHETERIZATION N/A 08/28/2015   Procedure: Right/Left Heart Cath and Coronary/Graft Angiography;  Surgeon: Troy Sine, MD;  Location: [facility];  Service: Cardiovascular;  Laterality:  N/A;   CHOLECYSTECTOMY  october 2012   CORONARY ARTERY BYPASS GRAFT     INSERT / REPLACE / REMOVE PACEMAKER  05/22/2012    dual chamber   PERMANENT PACEMAKER INSERTION N/A 05/22/2012   Procedure: PERMANENT PACEMAKER INSERTION;  Surgeon: Sanda Klein, MD;  Location: [facility];  Service: Cardiovascular;  Laterality: N/A;   repair of left indirect inguinal hernia with mesh.     repair of umbilical hernia.     TOTAL KNEE ARTHROPLASTY Right 09/26/2016   Procedure: TOTAL KNEE ARTHROPLASTY;  Surgeon: Frederik Pear, MD;  Location: [facility];  Service: Orthopedics;  Laterality: Right;    Current Medications: Outpatient Medications Prior to Visit  Medication Sig Dispense Refill   albuterol (VENTOLIN HFA) 108 (90 Base) MCG/ACT inhaler Inhale into the lungs.     apixaban (ELIQUIS) 5 MG TABS tablet Take 1 tablet (5 mg total) by mouth 2 (two) times daily. 180 tablet 1   Cholecalciferol (VITAMIN D3) 125 MCG (5000 UT) TABS Take 5,000 Units by mouth daily.     Cyanocobalamin (B-12) 1000 MCG/ML KIT Inject 1 Dose as directed every 30 (thirty) days.      dapagliflozin propanediol (FARXIGA) 10 MG TABS tablet Take 1 tablet (10 mg total) by mouth daily before breakfast. 30 tablet 3   ferrous sulfate 325 (65 FE) MG tablet Take 325 mg by mouth daily with breakfast.     fluticasone furoate-vilanterol (BREO ELLIPTA) 200-25 MCG/INH AEPB Inhale 1 puff into the lungs 2 (two) times daily.     hydrALAZINE (APRESOLINE) 25 MG tablet Take 1 tablet (25 mg total) by mouth 3 (three) times daily for 30 days. 90 tablet 0   Lancets (ONETOUCH DELICA PLUS [id number]) MISC Apply 1 each topically 3 (three) times daily.     LANTUS SOLOSTAR 100 UNIT/ML Solostar Pen Inject 20 Units into the skin at bedtime. ONLY TAKE IF BLOOD SUGAR IS GREATER THAN 200 (Patient taking differently: Inject 35 Units into the skin at bedtime. ONLY TAKE IF BLOOD SUGAR IS GREATER THAN 200) 15 mL 0   levothyroxine (SYNTHROID) 75 MCG tablet Take 75 mcg by mouth daily.      losartan (COZAAR) 25 MG tablet TAKE 1 TABLET BY MOUTH  DAILY 90 tablet 3   metoprolol succinate (TOPROL-XL) 50 MG 24 hr tablet TAKE 1 TABLET BY MOUTH DAILY 90 tablet 3   ONETOUCH VERIO test strip daily. As directed  4   potassium chloride SA (KLOR-CON M) 20 MEQ tablet TAKE 1 TABLET(20 MEQ) BY MOUTH DAILY 90 tablet 3   rosuvastatin (CRESTOR) 40 MG tablet Take 40 mg by mouth daily.     traZODone (DESYREL) 50 MG tablet Take 50 mg by mouth at bedtime as needed for sleep.      WIXELA INHUB 250-50 MCG/DOSE AEPB Inhale 1 puff into the lungs 2 (two) times a  day.     furosemide (LASIX) 40 MG tablet Take 2 tablets (80 mg total) by mouth daily. 60 tablet 3   No facility-administered medications prior to visit.     Allergies:   No known allergies   Social History   Socioeconomic History   Marital status: Widowed    Spouse name: Not on file   Number of children: 2   Years of education: Not on file   Highest education level: Not on file  Occupational History   Not on file  Tobacco Use   Smoking status: Former    Years: 40.00    Types: Cigarettes    Quit date: 08/06/2010    Years since quitting: 11.4   Smokeless tobacco: Never  Substance and Sexual Activity   Alcohol use: Yes    Comment: 5-6 beers per week   Drug use: Yes    Types: Marijuana    Comment: couple times a month   Sexual activity: Not on file  Other Topics Concern   Not on file  Social History Narrative   Not on file   Social Determinants of Health   Financial Resource Strain: Not on file  Food Insecurity: No Food Insecurity (12/21/2020)   Hunger Vital Sign    Worried About Running Out of Food in the Last Year: Never true    Ran Out of Food in the Last Year: Never true  Transportation Needs: No Transportation Needs (12/21/2020)   PRAPARE - Hydrologist (Medical): No    Lack of Transportation (Non-Medical): No  Physical Activity: Not on file  Stress: Not on file  Social Connections:  Not on file     Family History:  The patient's family history includes Heart disease in his brother, father, and maternal uncle.   ROS:   Please see the history of present illness.    ROS All other systems are reviewed and are negative.   PHYSICAL EXAM:   VS:  BP 110/64 (BP Location: Left Arm, Patient Position: Sitting, Cuff Size: Normal)   Pulse 61   Ht $R'5\' 11"'RE$  (1.803 m)   Wt 97.7 kg   SpO2 96%   BMI 30.04 kg/m      General: Alert, oriented x3, no distress, the left subclavian pacemaker site Head: no evidence of trauma, PERRL, EOMI, no exophtalmos or lid lag, no myxedema, no xanthelasma; normal ears, nose and oropharynx Neck: 8 cm elevation in jugular venous pulsations and prompt hepatojugular reflux; brisk carotid pulses without delay and no carotid bruits Chest: clear to auscultation, no signs of consolidation by percussion or palpation, normal fremitus, symmetrical and full respiratory excursions Cardiovascular: normal position and quality of the apical impulse, regular rhythm, normal first and paradoxically split second heart sounds, no murmurs, rubs or gallops Abdomen: no tenderness or distention, no masses by palpation, no abnormal pulsatility or arterial bruits, normal bowel sounds, no hepatosplenomegaly Extremities: no clubbing, cyanosis; symmetrical hard pitting 3+ edema of the lower extremities, almost to the knees bilaterally; small weeping wounds anterior left shin and blisters on the left calf and right calf. Neuro logical: Grossly nonfocal, slightly hard of hearing Psych: Normal mood and affect     Wt Readings from Last 3 Encounters:  12/27/21 97.7 kg  12/01/21 99.3 kg  10/26/21 100.3 kg   Studies/Labs Reviewed:   EKG:  EKG is ordered today.  Intracardiac electrogram shows atrial fibrillation with 100% ventricular pacing.  He has atrial fibrillation with slow ventricular response at 45-55  bpm when ventricular pacing is reduced to a lower rate limit of 40  bpm.  ECHO 12/23/2021    1. Left ventricular ejection fraction, by estimation, is 40 to 45%. The  left ventricle has mildly decreased function. The left ventricle  demonstrates global hypokinesis. There is mild concentric left ventricular  hypertrophy. Left ventricular diastolic  parameters are indeterminate. Elevated left ventricular end-diastolic  pressure.   2. Right ventricular systolic function is normal. The right ventricular  size is normal. There is normal pulmonary artery systolic pressure.   3. Left atrial size was severely dilated.   4. Right atrial size was mildly dilated.   5. The mitral valve is normal in structure. Mild mitral valve  regurgitation. No evidence of mitral stenosis.   6. The aortic valve is normal in structure. Aortic valve regurgitation is  not visualized. No aortic stenosis is present.   7. Aneurysm of the ascending aorta, measuring 46 mm.   8. The inferior vena cava is normal in size with greater than 50%  respiratory variability, suggesting right atrial pressure of 3 mmHg.    Recent Labs: 12/27/2021: BUN 21; Creatinine, Ser 1.62; Hemoglobin 13.0; Platelets 147; Potassium 4.4; Sodium 142  10/06/2021 Creatinine 1.63, potassium 4.7 Lipid Panel Jan 2019 LDL 78 Lipid Panel     Component Value Date/Time   CHOL 176 09/10/2010 0623   TRIG 94 09/10/2010 0623   HDL 50 09/10/2010 0623   CHOLHDL 3.5 09/10/2010 0623   VLDL 19 09/10/2010 0623   LDLCALC 107 (H) 09/10/2010 0623  05/28/2021 Cholesterol 99, HDL 41, LDL 40, triglycerides 95  ASSESSMENT:    1. Chronic combined systolic and diastolic CHF (congestive heart failure) (HCC)   2. Persistent atrial fibrillation ([place])   3. Acquired thrombophilia ([place])   4. Coronary artery disease involving native coronary artery of native heart without angina pectoris   5. Essential hypertension   6. Chronic obstructive pulmonary disease, unspecified COPD type ([place])   7. CHB (complete heart block) (HCC)   8.  Pacemaker   9. Dyslipidemia (high LDL; low HDL)   10. Type 2 diabetes mellitus with complication, with long-term current use of insulin ([place])   11. Stage 3b chronic kidney disease ([place])   12. Iron deficiency anemia due to chronic blood loss   13. Aneurysm of ascending aorta without rupture (HCC)       PLAN:  In order of problems listed above:  CHF:  Most recent LVEF by echocardiogram performed a few days ago was 40-45%, although it has waxed and waned over the last few years.  Shortness of breath and edema have improved substantially after adding SGLT2 inhibitor and adjusting his diuretic dose. On metoprolol and hydralazine, low dose losartan, SGLT2 inhibitor.  Target "dry weight" 220 pounds or less on his home scale.  LVEF is above the range where we usually recommend CRT pacing. Longstanding persistent atrial flutter/fibrillation: Unaware of palpitations.  Compliant with anticoagulation.  CHA2DS2-VASc 6 (age 30, CAD, HTN, DM).  Has failed multiple attempts at maintenance of sinus rhythm including use of amiodarone. Anticoagulation: He has not had overt bleeding, but his hemoglobin was down to 8.2 in August.  Recheck labs including iron studies.  He is over age 30 and his creatinine is just over 1.5.  If his creatinine remains at this level we will reduce the dose of Eliquis to 2.5 mg twice daily. CAD s/p CABG: Asymptomatic.  Denies angina, has occluded native arteries, graft dependent (all grafts patent at the cardiac  catheterization 2017).  If we need to reevaluate his coronaries, he is at high risk of contrast-induced nephrotoxicity and we would probably start with a nuclear perfusion study, preferably a PET scan. HTN: Good control COPD: Currently without wheezing.  He did have issues on carvedilol and does better on a more selective agent such as metoprolol. CHB: Today he is not pacemaker dependent.  There is underlying atrial fibrillation with slow ventricular response at 45-55 bpm.  For  safety purposes it is better to consider him pacemaker dependent. PPM: Dual-chamber device programmed VVIR for permanent atrial fibrillation.  If EF deteriorates further we could consider him for CRT-P; however, in view of his age and numerous comorbid conditions this may not be a low risk procedure. HLP: Continue statin.  HDL borderline low. DM: Adequate control, most recent hemoglobin A1c 7.2%.  Reduced the dose of Lantus in half when he starts [place].  He adjust his dose of insulin depending on his glucose levels.  He has not had problems with hypoglycemia and his fasting and postprandial glucose levels are currently acceptable. CKD 3b: Previous baseline seems to be around 1.5 with a GFR of about 40.  Most recent creatinine 1.6.  Low threshold to decrease his dose of Eliquis if he has any bleeding issues or if his creatinine worsens further. Anemia: Improved.  Iron stores are still not fully replete.  Continue iron for another 2-3 months. Aortic aneurysm: Last study was performed in October 2022 and showed an ascending aorta of 4.5 cm, aortic arch 3.9 cm.  At his age and with his comorbid conditions I doubt that we will be performing preventive surgery.  I do not think routine follow-up studies are indicated.    Medication Adjustments/Labs and Tests Ordered: Current medicines are reviewed at length with the patient today.  Concerns regarding medicines are outlined above.  Medication changes, Labs and Tests ordered today are listed in the Patient Instructions below. Patient Instructions  Medication Instructions:  Furosemide: If your weight is 220 lbs or higher taken an extra 40 mg.  *If you need a refill on your cardiac medications before your next appointment, please call your pharmacy*   Lab Work: Your provider would like for you to have the following labs today: CBC, BMET, iron studies, A1C  If you have labs (blood work) drawn today and your tests are completely normal, you will receive  your results only by: [place] (if you have MyChart) OR A paper copy in the mail If you have any lab test that is abnormal or we need to change your treatment, we will call you to review the results.   Testing/Procedures: None ordered   Follow-Up: At [facility], you and your health needs are our priority.  As part of our continuing mission to provide you with exceptional heart care, we have created designated Provider Care Teams.  These Care Teams include your primary Cardiologist (physician) and Advanced Practice Providers (APPs -  Physician Assistants and Nurse Practitioners) who all work together to provide you with the care you need, when you need it.  We recommend signing up for the patient portal called "MyChart".  Sign up information is provided on this After Visit Summary.  MyChart is used to connect with patients for Virtual Visits (Telemedicine).  Patients are able to view lab/test results, encounter notes, upcoming appointments, etc.  Non-urgent messages can be sent to your provider as well.   To learn more about what you can do with MyChart, go  to [email or web address].    Your next appointment:   3 month(s)  The format for your next appointment:   In Person  Provider:   Sanda Klein, MD          Signed, Sanda Klein, MD  12/29/2021 3:16 PM    [facility] [place], [place], [place]  [address] Phone: [phone]; Fax: [phone]

## 2021-12-27 NOTE — Patient Instructions (Addendum)
Medication Instructions:  Furosemide: If your weight is 220 lbs or higher taken an extra 40 mg.  *If you need a refill on your cardiac medications before your next appointment, please call your pharmacy*   Lab Work: Your provider would like for you to have the following labs today: CBC, BMET, iron studies, A1C  If you have labs (blood work) drawn today and your tests are completely normal, you will receive your results only by: [place] (if you have MyChart) OR A paper copy in the mail If you have any lab test that is abnormal or we need to change your treatment, we will call you to review the results.   Testing/Procedures: None ordered   Follow-Up: At [facility] [place], you and your health needs are our priority.  As part of our continuing mission to provide you with exceptional heart care, we have created designated Provider Care Teams.  These Care Teams include your primary Cardiologist (physician) and Advanced Practice Providers (APPs -  Physician Assistants and Nurse Practitioners) who all work together to provide you with the care you need, when you need it.  We recommend signing up for the patient portal called "MyChart".  Sign up information is provided on this After Visit Summary.  MyChart is used to connect with patients for Virtual Visits (Telemedicine).  Patients are able to view lab/test results, encounter notes, upcoming appointments, etc.  Non-urgent messages can be sent to your provider as well.   To learn more about what you can do with MyChart, go to [email or web address].    Your next appointment:   3 month(s)  The format for your next appointment:   In Person  Provider:   Sanda Klein, MD

## 2021-12-28 LAB — BASIC METABOLIC PANEL
BUN/Creatinine Ratio: 13 (ref 10–24)
BUN: 21 mg/dL (ref 8–27)
CO2: 28 mmol/L (ref 20–29)
Calcium: 9.6 mg/dL (ref 8.6–10.2)
Chloride: 100 mmol/L (ref 96–106)
Creatinine, Ser: 1.62 mg/dL — ABNORMAL HIGH (ref 0.76–1.27)
Glucose: 139 mg/dL — ABNORMAL HIGH (ref 70–99)
Potassium: 4.4 mmol/L (ref 3.5–5.2)
Sodium: 142 mmol/L (ref 134–144)
eGFR: 41 mL/min/{1.73_m2} — ABNORMAL LOW (ref >59)

## 2021-12-28 LAB — CBC
Hematocrit: 39.9 % (ref 37.5–51.0)
Hemoglobin: 13 g/dL (ref 13.0–17.7)
MCH: 29.7 pg (ref 26.6–33.0)
MCHC: 32.6 g/dL (ref 31.5–35.7)
MCV: 91 fL (ref 79–97)
Platelets: 147 10*3/uL — ABNORMAL LOW (ref 150–450)
RBC: 4.38 x10E6/uL (ref 4.14–5.80)
RDW: 16.8 % — ABNORMAL HIGH (ref 11.6–15.4)
WBC: 6.4 10*3/uL (ref 3.4–10.8)

## 2021-12-28 LAB — IRON,TIBC AND FERRITIN PANEL
Ferritin: 28 ng/mL — ABNORMAL LOW (ref 30–400)
Iron Saturation: 14 % — ABNORMAL LOW (ref 15–55)
Iron: 45 ug/dL (ref 38–169)
Total Iron Binding Capacity: 331 ug/dL (ref 250–450)
UIBC: 286 ug/dL (ref 111–343)

## 2021-12-28 LAB — HEMOGLOBIN A1C
Est. average glucose Bld gHb Est-mCnc: 137 mg/dL
Hgb A1c MFr Bld: 6.4 % — ABNORMAL HIGH (ref 4.8–5.6)

## 2021-12-29 ENCOUNTER — Other Ambulatory Visit: Payer: Self-pay

## 2021-12-29 DIAGNOSIS — I4819 Other persistent atrial fibrillation: Secondary | ICD-10-CM

## 2022-01-04 ENCOUNTER — Encounter: Payer: Self-pay | Admitting: *Deleted

## 2022-01-04 ENCOUNTER — Other Ambulatory Visit: Payer: Self-pay | Admitting: *Deleted

## 2022-01-04 DIAGNOSIS — I4819 Other persistent atrial fibrillation: Secondary | ICD-10-CM

## 2022-01-26 ENCOUNTER — Other Ambulatory Visit: Payer: Self-pay | Admitting: Cardiovascular Disease

## 2022-01-28 ENCOUNTER — Other Ambulatory Visit: Payer: Self-pay | Admitting: Cardiovascular Disease

## 2022-02-03 ENCOUNTER — Ambulatory Visit: Payer: Medicare Other | Admitting: Cardiovascular Disease

## 2022-02-04 DIAGNOSIS — E538 Deficiency of other specified B group vitamins: Secondary | ICD-10-CM | POA: Diagnosis not present

## 2022-02-07 ENCOUNTER — Ambulatory Visit (INDEPENDENT_AMBULATORY_CARE_PROVIDER_SITE_OTHER): Payer: Medicare Other

## 2022-02-07 DIAGNOSIS — I4819 Other persistent atrial fibrillation: Secondary | ICD-10-CM | POA: Diagnosis not present

## 2022-02-07 LAB — CUP PACEART REMOTE DEVICE CHECK
Battery Impedance: 2007 Ohm
Battery Remaining Longevity: 39 mo
Battery Voltage: 2.75 V
Brady Statistic RV Percent Paced: 99 %
Date Time Interrogation Session: 20231203101844
Implantable Lead Connection Status: 753985
Implantable Lead Connection Status: 753985
Implantable Lead Implant Date: 20140318
Implantable Lead Implant Date: 20140318
Implantable Lead Location: 753859
Implantable Lead Location: 753860
Implantable Lead Model: 5076
Implantable Lead Model: 5076
Implantable Pulse Generator Implant Date: 20140318
Lead Channel Impedance Value: 67 Ohm
Lead Channel Impedance Value: 684 Ohm
Lead Channel Pacing Threshold Amplitude: 1 V
Lead Channel Pacing Threshold Pulse Width: 0.4 ms
Lead Channel Setting Pacing Amplitude: 2.5 V
Lead Channel Setting Pacing Pulse Width: 0.4 ms
Lead Channel Setting Sensing Sensitivity: 2.8 mV
Zone Setting Status: 755011
Zone Setting Status: 755011

## 2022-03-01 ENCOUNTER — Telehealth: Payer: Self-pay | Admitting: Cardiovascular Disease

## 2022-03-01 MED ORDER — DAPAGLIFLOZIN PROPANEDIOL 10 MG PO TABS: 10.0000 mg | ORAL_TABLET | Freq: Every day | ORAL | 3 refills | Status: DC

## 2022-03-01 NOTE — Telephone Encounter (Signed)
*  STAT* If patient is at the pharmacy, call can be transferred to refill team.   1. Which medications need to be refilled? (please list name of each medication and dose if known) dapagliflozin propanediol (FARXIGA) 10 MG TABS tablet   2. Which pharmacy/location (including street and city if local pharmacy) is medication to be sent to? [place], [place] - [address] AT [address]   3. Do they need a 30 day or 90 day supply? [place]

## 2022-03-04 DIAGNOSIS — E538 Deficiency of other specified B group vitamins: Secondary | ICD-10-CM | POA: Diagnosis not present

## 2022-03-17 NOTE — Progress Notes (Signed)
Remote pacemaker transmission.   

## 2022-04-08 DIAGNOSIS — N183 Chronic kidney disease, stage 3 unspecified: Secondary | ICD-10-CM | POA: Diagnosis not present

## 2022-04-11 DIAGNOSIS — H0102A Squamous blepharitis right eye, upper and lower eyelids: Secondary | ICD-10-CM | POA: Diagnosis not present

## 2022-04-11 DIAGNOSIS — H43813 Vitreous degeneration, bilateral: Secondary | ICD-10-CM | POA: Diagnosis not present

## 2022-04-11 DIAGNOSIS — Z961 Presence of intraocular lens: Secondary | ICD-10-CM | POA: Diagnosis not present

## 2022-04-11 DIAGNOSIS — H0102B Squamous blepharitis left eye, upper and lower eyelids: Secondary | ICD-10-CM | POA: Diagnosis not present

## 2022-04-11 DIAGNOSIS — E119 Type 2 diabetes mellitus without complications: Secondary | ICD-10-CM | POA: Diagnosis not present

## 2022-04-13 DIAGNOSIS — E1169 Type 2 diabetes mellitus with other specified complication: Secondary | ICD-10-CM | POA: Diagnosis not present

## 2022-04-13 DIAGNOSIS — E1121 Type 2 diabetes mellitus with diabetic nephropathy: Secondary | ICD-10-CM | POA: Diagnosis not present

## 2022-04-13 DIAGNOSIS — E785 Hyperlipidemia, unspecified: Secondary | ICD-10-CM | POA: Diagnosis not present

## 2022-04-13 DIAGNOSIS — E039 Hypothyroidism, unspecified: Secondary | ICD-10-CM | POA: Diagnosis not present

## 2022-04-13 DIAGNOSIS — N1832 Chronic kidney disease, stage 3b: Secondary | ICD-10-CM | POA: Diagnosis not present

## 2022-04-13 DIAGNOSIS — I10 Essential (primary) hypertension: Secondary | ICD-10-CM | POA: Diagnosis not present

## 2022-04-13 DIAGNOSIS — J449 Chronic obstructive pulmonary disease, unspecified: Secondary | ICD-10-CM | POA: Diagnosis not present

## 2022-04-13 DIAGNOSIS — G4700 Insomnia, unspecified: Secondary | ICD-10-CM | POA: Diagnosis not present

## 2022-04-14 DIAGNOSIS — E785 Hyperlipidemia, unspecified: Secondary | ICD-10-CM | POA: Diagnosis not present

## 2022-04-14 DIAGNOSIS — E039 Hypothyroidism, unspecified: Secondary | ICD-10-CM | POA: Diagnosis not present

## 2022-04-14 DIAGNOSIS — Z794 Long term (current) use of insulin: Secondary | ICD-10-CM | POA: Diagnosis not present

## 2022-04-14 DIAGNOSIS — Z950 Presence of cardiac pacemaker: Secondary | ICD-10-CM | POA: Diagnosis not present

## 2022-04-14 DIAGNOSIS — E1121 Type 2 diabetes mellitus with diabetic nephropathy: Secondary | ICD-10-CM | POA: Diagnosis not present

## 2022-04-14 DIAGNOSIS — E538 Deficiency of other specified B group vitamins: Secondary | ICD-10-CM | POA: Diagnosis not present

## 2022-04-14 DIAGNOSIS — E559 Vitamin D deficiency, unspecified: Secondary | ICD-10-CM | POA: Diagnosis not present

## 2022-04-14 DIAGNOSIS — J449 Chronic obstructive pulmonary disease, unspecified: Secondary | ICD-10-CM | POA: Diagnosis not present

## 2022-04-14 DIAGNOSIS — N1832 Chronic kidney disease, stage 3b: Secondary | ICD-10-CM | POA: Diagnosis not present

## 2022-04-15 DIAGNOSIS — I129 Hypertensive chronic kidney disease with stage 1 through stage 4 chronic kidney disease, or unspecified chronic kidney disease: Secondary | ICD-10-CM | POA: Diagnosis not present

## 2022-04-15 DIAGNOSIS — N183 Chronic kidney disease, stage 3 unspecified: Secondary | ICD-10-CM | POA: Diagnosis not present

## 2022-05-02 ENCOUNTER — Other Ambulatory Visit: Payer: Self-pay | Admitting: Cardiovascular Disease

## 2022-05-02 DIAGNOSIS — I480 Paroxysmal atrial fibrillation: Secondary | ICD-10-CM

## 2022-05-16 ENCOUNTER — Other Ambulatory Visit: Payer: Self-pay

## 2022-05-16 MED ORDER — POTASSIUM CHLORIDE CRYS ER 20 MEQ PO TBCR: EXTENDED_RELEASE_TABLET | ORAL | 1 refills | Status: DC

## 2022-06-17 DIAGNOSIS — E538 Deficiency of other specified B group vitamins: Secondary | ICD-10-CM | POA: Diagnosis not present

## 2022-06-24 ENCOUNTER — Emergency Department (HOSPITAL_COMMUNITY): Payer: Medicare Other

## 2022-06-24 ENCOUNTER — Inpatient Hospital Stay (HOSPITAL_COMMUNITY)
Admission: EM | Admit: 2022-06-24 | Discharge: 2022-06-29 | DRG: 698 | Disposition: A | Discharge status: Home / Self Care | Payer: Medicare Other | Attending: General Surgery | Admitting: General Surgery

## 2022-06-24 ENCOUNTER — Other Ambulatory Visit: Payer: Self-pay

## 2022-06-24 DIAGNOSIS — R918 Other nonspecific abnormal finding of lung field: Secondary | ICD-10-CM | POA: Diagnosis not present

## 2022-06-24 DIAGNOSIS — R9431 Abnormal electrocardiogram [ECG] [EKG]: Secondary | ICD-10-CM | POA: Diagnosis not present

## 2022-06-24 DIAGNOSIS — S225XXA Flail chest, initial encounter for closed fracture: Secondary | ICD-10-CM | POA: Diagnosis present

## 2022-06-24 DIAGNOSIS — Z8249 Family history of ischemic heart disease and other diseases of the circulatory system: Secondary | ICD-10-CM

## 2022-06-24 DIAGNOSIS — E1122 Type 2 diabetes mellitus with diabetic chronic kidney disease: Principal | ICD-10-CM | POA: Diagnosis present

## 2022-06-24 DIAGNOSIS — M25561 Pain in right knee: Secondary | ICD-10-CM | POA: Diagnosis not present

## 2022-06-24 DIAGNOSIS — E871 Hypo-osmolality and hyponatremia: Secondary | ICD-10-CM | POA: Diagnosis present

## 2022-06-24 DIAGNOSIS — S27892A Contusion of other specified intrathoracic organs, initial encounter: Secondary | ICD-10-CM | POA: Diagnosis not present

## 2022-06-24 DIAGNOSIS — Y904 Blood alcohol level of 80-99 mg/100 ml: Secondary | ICD-10-CM | POA: Diagnosis present

## 2022-06-24 DIAGNOSIS — E785 Hyperlipidemia, unspecified: Secondary | ICD-10-CM | POA: Diagnosis not present

## 2022-06-24 DIAGNOSIS — I480 Paroxysmal atrial fibrillation: Secondary | ICD-10-CM | POA: Diagnosis not present

## 2022-06-24 DIAGNOSIS — K4090 Unilateral inguinal hernia, without obstruction or gangrene, not specified as recurrent: Secondary | ICD-10-CM | POA: Diagnosis not present

## 2022-06-24 DIAGNOSIS — D62 Acute posthemorrhagic anemia: Secondary | ICD-10-CM | POA: Diagnosis not present

## 2022-06-24 DIAGNOSIS — I499 Cardiac arrhythmia, unspecified: Secondary | ICD-10-CM | POA: Diagnosis not present

## 2022-06-24 DIAGNOSIS — Z794 Long term (current) use of insulin: Secondary | ICD-10-CM

## 2022-06-24 DIAGNOSIS — Z23 Encounter for immunization: Secondary | ICD-10-CM

## 2022-06-24 DIAGNOSIS — S27321A Contusion of lung, unilateral, initial encounter: Secondary | ICD-10-CM | POA: Diagnosis not present

## 2022-06-24 DIAGNOSIS — M1990 Unspecified osteoarthritis, unspecified site: Secondary | ICD-10-CM | POA: Diagnosis present

## 2022-06-24 DIAGNOSIS — Z951 Presence of aortocoronary bypass graft: Secondary | ICD-10-CM

## 2022-06-24 DIAGNOSIS — I130 Hypertensive heart and chronic kidney disease with heart failure and stage 1 through stage 4 chronic kidney disease, or unspecified chronic kidney disease: Secondary | ICD-10-CM | POA: Diagnosis not present

## 2022-06-24 DIAGNOSIS — J449 Chronic obstructive pulmonary disease, unspecified: Secondary | ICD-10-CM | POA: Diagnosis not present

## 2022-06-24 DIAGNOSIS — I252 Old myocardial infarction: Secondary | ICD-10-CM

## 2022-06-24 DIAGNOSIS — S271XXA Traumatic hemothorax, initial encounter: Secondary | ICD-10-CM | POA: Diagnosis not present

## 2022-06-24 DIAGNOSIS — S20219A Contusion of unspecified front wall of thorax, initial encounter: Secondary | ICD-10-CM | POA: Diagnosis not present

## 2022-06-24 DIAGNOSIS — I441 Atrioventricular block, second degree: Secondary | ICD-10-CM | POA: Diagnosis not present

## 2022-06-24 DIAGNOSIS — M25462 Effusion, left knee: Secondary | ICD-10-CM | POA: Diagnosis not present

## 2022-06-24 DIAGNOSIS — I959 Hypotension, unspecified: Secondary | ICD-10-CM | POA: Diagnosis not present

## 2022-06-24 DIAGNOSIS — Z79890 Hormone replacement therapy (postmenopausal): Secondary | ICD-10-CM

## 2022-06-24 DIAGNOSIS — S2241XA Multiple fractures of ribs, right side, initial encounter for closed fracture: Principal | ICD-10-CM

## 2022-06-24 DIAGNOSIS — Z7901 Long term (current) use of anticoagulants: Secondary | ICD-10-CM

## 2022-06-24 DIAGNOSIS — G319 Degenerative disease of nervous system, unspecified: Secondary | ICD-10-CM | POA: Diagnosis not present

## 2022-06-24 DIAGNOSIS — Z87891 Personal history of nicotine dependence: Secondary | ICD-10-CM | POA: Diagnosis not present

## 2022-06-24 DIAGNOSIS — R079 Chest pain, unspecified: Secondary | ICD-10-CM | POA: Diagnosis not present

## 2022-06-24 DIAGNOSIS — R0789 Other chest pain: Secondary | ICD-10-CM | POA: Diagnosis not present

## 2022-06-24 DIAGNOSIS — S62306B Unspecified fracture of fifth metacarpal bone, right hand, initial encounter for open fracture: Secondary | ICD-10-CM | POA: Diagnosis not present

## 2022-06-24 DIAGNOSIS — E11649 Type 2 diabetes mellitus with hypoglycemia without coma: Secondary | ICD-10-CM | POA: Diagnosis not present

## 2022-06-24 DIAGNOSIS — E1165 Type 2 diabetes mellitus with hyperglycemia: Secondary | ICD-10-CM | POA: Diagnosis not present

## 2022-06-24 DIAGNOSIS — I2510 Atherosclerotic heart disease of native coronary artery without angina pectoris: Secondary | ICD-10-CM | POA: Diagnosis not present

## 2022-06-24 DIAGNOSIS — F10129 Alcohol abuse with intoxication, unspecified: Secondary | ICD-10-CM | POA: Diagnosis present

## 2022-06-24 DIAGNOSIS — Z79899 Other long term (current) drug therapy: Secondary | ICD-10-CM

## 2022-06-24 DIAGNOSIS — M25461 Effusion, right knee: Secondary | ICD-10-CM | POA: Diagnosis not present

## 2022-06-24 DIAGNOSIS — S0990XA Unspecified injury of head, initial encounter: Secondary | ICD-10-CM | POA: Diagnosis not present

## 2022-06-24 DIAGNOSIS — Z041 Encounter for examination and observation following transport accident: Secondary | ICD-10-CM | POA: Diagnosis not present

## 2022-06-24 DIAGNOSIS — M25562 Pain in left knee: Secondary | ICD-10-CM | POA: Diagnosis not present

## 2022-06-24 DIAGNOSIS — Z950 Presence of cardiac pacemaker: Secondary | ICD-10-CM

## 2022-06-24 DIAGNOSIS — Z7984 Long term (current) use of oral hypoglycemic drugs: Secondary | ICD-10-CM

## 2022-06-24 DIAGNOSIS — Z96651 Presence of right artificial knee joint: Secondary | ICD-10-CM | POA: Diagnosis present

## 2022-06-24 DIAGNOSIS — Z743 Need for continuous supervision: Secondary | ICD-10-CM | POA: Diagnosis not present

## 2022-06-24 LAB — COMPREHENSIVE METABOLIC PANEL
ALT: 24 U/L (ref 0–44)
AST: 51 U/L — ABNORMAL HIGH (ref 15–41)
Albumin: 3.8 g/dL (ref 3.5–5.0)
Alkaline Phosphatase: 32 U/L — ABNORMAL LOW (ref 38–126)
Anion gap: 14 (ref 5–15)
BUN: 31 mg/dL — ABNORMAL HIGH (ref 8–23)
CO2: 22 mmol/L (ref 22–32)
Calcium: 8.8 mg/dL — ABNORMAL LOW (ref 8.9–10.3)
Chloride: 97 mmol/L — ABNORMAL LOW (ref 98–111)
Creatinine, Ser: 2 mg/dL — ABNORMAL HIGH (ref 0.61–1.24)
GFR, Estimated: 32 mL/min — ABNORMAL LOW (ref >60)
Glucose, Bld: 115 mg/dL — ABNORMAL HIGH (ref 70–99)
Potassium: 4.1 mmol/L (ref 3.5–5.1)
Sodium: 133 mmol/L — ABNORMAL LOW (ref 135–145)
Total Bilirubin: 1 mg/dL (ref 0.3–1.2)
Total Protein: 6.7 g/dL (ref 6.5–8.1)

## 2022-06-24 LAB — I-STAT CHEM 8, ED
BUN: 39 mg/dL — ABNORMAL HIGH (ref 8–23)
Calcium, Ion: 1.09 mmol/L — ABNORMAL LOW (ref 1.15–1.40)
Chloride: 99 mmol/L (ref 98–111)
Creatinine, Ser: 2.2 mg/dL — ABNORMAL HIGH (ref 0.61–1.24)
Glucose, Bld: 111 mg/dL — ABNORMAL HIGH (ref 70–99)
HCT: 37 % — ABNORMAL LOW (ref 39.0–52.0)
Hemoglobin: 12.6 g/dL — ABNORMAL LOW (ref 13.0–17.0)
Potassium: 4.2 mmol/L (ref 3.5–5.1)
Sodium: 133 mmol/L — ABNORMAL LOW (ref 135–145)
TCO2: 24 mmol/L (ref 22–32)

## 2022-06-24 LAB — CBC
HCT: 36.1 % — ABNORMAL LOW (ref 39.0–52.0)
Hemoglobin: 11.5 g/dL — ABNORMAL LOW (ref 13.0–17.0)
MCH: 30.1 pg (ref 26.0–34.0)
MCHC: 31.9 g/dL (ref 30.0–36.0)
MCV: 94.5 fL (ref 80.0–100.0)
Platelets: 140 10*3/uL — ABNORMAL LOW (ref 150–400)
RBC: 3.82 MIL/uL — ABNORMAL LOW (ref 4.22–5.81)
RDW: 14.6 % (ref 11.5–15.5)
WBC: 8 10*3/uL (ref 4.0–10.5)
nRBC: 0 % (ref 0.0–0.2)

## 2022-06-24 LAB — ETHANOL: Alcohol, Ethyl (B): 82 mg/dL — ABNORMAL HIGH (ref <10)

## 2022-06-24 LAB — PROTIME-INR
INR: 1.2 (ref 0.8–1.2)
Prothrombin Time: 15.4 seconds — ABNORMAL HIGH (ref 11.4–15.2)

## 2022-06-24 LAB — TROPONIN I (HIGH SENSITIVITY): Troponin I (High Sensitivity): 45 ng/L — ABNORMAL HIGH (ref <18)

## 2022-06-24 LAB — SAMPLE TO BLOOD BANK

## 2022-06-24 LAB — LACTIC ACID, PLASMA: Lactic Acid, Venous: 2.1 mmol/L (ref 0.5–1.9)

## 2022-06-24 MED ORDER — MORPHINE SULFATE (PF) 4 MG/ML IV SOLN: 4.0000 mg | Freq: Once | INTRAVENOUS | Status: DC

## 2022-06-24 MED ORDER — CEFAZOLIN SODIUM-DEXTROSE 2-4 GM/100ML-% IV SOLN
2.0000 g | INTRAVENOUS | Status: AC
  Administered 2022-06-25: 2 g via INTRAVENOUS
  Filled 2022-06-24: qty 100

## 2022-06-24 MED ORDER — IOHEXOL 350 MG/ML SOLN
100.0000 mL | Freq: Once | INTRAVENOUS | Status: AC | PRN
  Administered 2022-06-24: 100 mL via INTRAVENOUS

## 2022-06-24 MED ORDER — TETANUS-DIPHTH-ACELL PERTUSSIS 5-2.5-18.5 LF-MCG/0.5 IM SUSY
0.5000 mL | PREFILLED_SYRINGE | Freq: Once | INTRAMUSCULAR | Status: AC
  Administered 2022-06-24: 0.5 mL via INTRAMUSCULAR
  Filled 2022-06-24: qty 0.5

## 2022-06-24 MED ORDER — FENTANYL CITRATE PF 50 MCG/ML IJ SOSY
50.0000 ug | PREFILLED_SYRINGE | Freq: Once | INTRAMUSCULAR | Status: AC
  Administered 2022-06-24: 50 ug via INTRAVENOUS
  Filled 2022-06-24: qty 1

## 2022-06-24 MED ORDER — LACTATED RINGERS IV BOLUS
1000.0000 mL | Freq: Once | INTRAVENOUS | Status: AC
  Administered 2022-06-24: 1000 mL via INTRAVENOUS
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
## 2022-06-24 NOTE — ED Notes (Signed)
Critical lab received from lab, MD made aware.

## 2022-06-25 ENCOUNTER — Inpatient Hospital Stay (HOSPITAL_COMMUNITY): Payer: Medicare Other

## 2022-06-25 DIAGNOSIS — S271XXA Traumatic hemothorax, initial encounter: Secondary | ICD-10-CM | POA: Diagnosis present

## 2022-06-25 DIAGNOSIS — E11649 Type 2 diabetes mellitus with hypoglycemia without coma: Secondary | ICD-10-CM | POA: Diagnosis not present

## 2022-06-25 DIAGNOSIS — I441 Atrioventricular block, second degree: Secondary | ICD-10-CM | POA: Diagnosis present

## 2022-06-25 DIAGNOSIS — I2510 Atherosclerotic heart disease of native coronary artery without angina pectoris: Secondary | ICD-10-CM | POA: Diagnosis present

## 2022-06-25 DIAGNOSIS — S225XXA Flail chest, initial encounter for closed fracture: Secondary | ICD-10-CM | POA: Diagnosis present

## 2022-06-25 DIAGNOSIS — M1990 Unspecified osteoarthritis, unspecified site: Secondary | ICD-10-CM | POA: Diagnosis present

## 2022-06-25 DIAGNOSIS — J449 Chronic obstructive pulmonary disease, unspecified: Secondary | ICD-10-CM | POA: Diagnosis present

## 2022-06-25 DIAGNOSIS — R079 Chest pain, unspecified: Secondary | ICD-10-CM | POA: Diagnosis present

## 2022-06-25 DIAGNOSIS — I130 Hypertensive heart and chronic kidney disease with heart failure and stage 1 through stage 4 chronic kidney disease, or unspecified chronic kidney disease: Secondary | ICD-10-CM | POA: Diagnosis present

## 2022-06-25 DIAGNOSIS — Z8249 Family history of ischemic heart disease and other diseases of the circulatory system: Secondary | ICD-10-CM | POA: Diagnosis not present

## 2022-06-25 DIAGNOSIS — Z23 Encounter for immunization: Secondary | ICD-10-CM | POA: Diagnosis present

## 2022-06-25 DIAGNOSIS — I480 Paroxysmal atrial fibrillation: Secondary | ICD-10-CM | POA: Diagnosis present

## 2022-06-25 DIAGNOSIS — Z950 Presence of cardiac pacemaker: Secondary | ICD-10-CM | POA: Diagnosis not present

## 2022-06-25 DIAGNOSIS — S27892A Contusion of other specified intrathoracic organs, initial encounter: Secondary | ICD-10-CM | POA: Diagnosis present

## 2022-06-25 DIAGNOSIS — I959 Hypotension, unspecified: Secondary | ICD-10-CM | POA: Diagnosis present

## 2022-06-25 DIAGNOSIS — E871 Hypo-osmolality and hyponatremia: Secondary | ICD-10-CM | POA: Diagnosis present

## 2022-06-25 DIAGNOSIS — E1165 Type 2 diabetes mellitus with hyperglycemia: Secondary | ICD-10-CM | POA: Diagnosis present

## 2022-06-25 DIAGNOSIS — E785 Hyperlipidemia, unspecified: Secondary | ICD-10-CM | POA: Diagnosis present

## 2022-06-25 DIAGNOSIS — D62 Acute posthemorrhagic anemia: Secondary | ICD-10-CM | POA: Diagnosis not present

## 2022-06-25 DIAGNOSIS — Z794 Long term (current) use of insulin: Secondary | ICD-10-CM | POA: Diagnosis not present

## 2022-06-25 DIAGNOSIS — Z87891 Personal history of nicotine dependence: Secondary | ICD-10-CM | POA: Diagnosis not present

## 2022-06-25 DIAGNOSIS — F10129 Alcohol abuse with intoxication, unspecified: Secondary | ICD-10-CM | POA: Diagnosis present

## 2022-06-25 DIAGNOSIS — S62306B Unspecified fracture of fifth metacarpal bone, right hand, initial encounter for open fracture: Secondary | ICD-10-CM | POA: Diagnosis present

## 2022-06-25 DIAGNOSIS — E1122 Type 2 diabetes mellitus with diabetic chronic kidney disease: Secondary | ICD-10-CM | POA: Diagnosis present

## 2022-06-25 DIAGNOSIS — Y904 Blood alcohol level of 80-99 mg/100 ml: Secondary | ICD-10-CM | POA: Diagnosis not present

## 2022-06-25 DIAGNOSIS — S27321A Contusion of lung, unilateral, initial encounter: Secondary | ICD-10-CM | POA: Diagnosis present

## 2022-06-25 LAB — CBC
HCT: 28.2 % — ABNORMAL LOW (ref 39.0–52.0)
Hemoglobin: 9 g/dL — ABNORMAL LOW (ref 13.0–17.0)
MCH: 30.4 pg (ref 26.0–34.0)
MCHC: 31.9 g/dL (ref 30.0–36.0)
MCV: 95.3 fL (ref 80.0–100.0)
Platelets: 141 K/uL — ABNORMAL LOW (ref 150–400)
RBC: 2.96 MIL/uL — ABNORMAL LOW (ref 4.22–5.81)
RDW: 14.6 % (ref 11.5–15.5)
WBC: 12.7 K/uL — ABNORMAL HIGH (ref 4.0–10.5)
nRBC: 0 % (ref 0.0–0.2)

## 2022-06-25 LAB — GLUCOSE, CAPILLARY
Glucose-Capillary: 100 mg/dL — ABNORMAL HIGH (ref 70–99)
Glucose-Capillary: 57 mg/dL — ABNORMAL LOW (ref 70–99)
Glucose-Capillary: 136 mg/dL — ABNORMAL HIGH (ref 70–99)
Glucose-Capillary: 69 mg/dL — ABNORMAL LOW (ref 70–99)
Glucose-Capillary: 166 mg/dL — ABNORMAL HIGH (ref 70–99)
Glucose-Capillary: 129 mg/dL — ABNORMAL HIGH (ref 70–99)
Glucose-Capillary: 71 mg/dL (ref 70–99)

## 2022-06-25 LAB — BASIC METABOLIC PANEL
Anion gap: 13 (ref 5–15)
BUN: 31 mg/dL — ABNORMAL HIGH (ref 8–23)
CO2: 21 mmol/L — ABNORMAL LOW (ref 22–32)
Calcium: 8.2 mg/dL — ABNORMAL LOW (ref 8.9–10.3)
Chloride: 96 mmol/L — ABNORMAL LOW (ref 98–111)
Creatinine, Ser: 1.93 mg/dL — ABNORMAL HIGH (ref 0.61–1.24)
GFR, Estimated: 33 mL/min — ABNORMAL LOW (ref >60)
Glucose, Bld: 121 mg/dL — ABNORMAL HIGH (ref 70–99)
Potassium: 3.4 mmol/L — ABNORMAL LOW (ref 3.5–5.1)
Sodium: 130 mmol/L — ABNORMAL LOW (ref 135–145)

## 2022-06-25 LAB — URINALYSIS, ROUTINE W REFLEX MICROSCOPIC
Bilirubin Urine: NEGATIVE
Glucose, UA: >=500 mg/dL — AB
Ketones, ur: NEGATIVE mg/dL
Leukocytes,Ua: NEGATIVE
Nitrite: NEGATIVE
Protein, ur: 100 mg/dL — AB
RBC / HPF: >50 RBC/hpf (ref 0–5)
Specific Gravity, Urine: 1.022 (ref 1.005–1.030)
pH: 5 (ref 5.0–8.0)

## 2022-06-25 LAB — MRSA NEXT GEN BY PCR, NASAL: MRSA by PCR Next Gen: NOT DETECTED

## 2022-06-25 LAB — TROPONIN I (HIGH SENSITIVITY): Troponin I (High Sensitivity): 110 ng/L (ref <18)

## 2022-06-25 MED ORDER — ACETAMINOPHEN 325 MG PO TABS: 650.0000 mg | ORAL_TABLET | ORAL | Status: DC | PRN

## 2022-06-25 MED ORDER — SODIUM CHLORIDE 0.9 % IV SOLN: INTRAVENOUS | Status: DC

## 2022-06-25 MED ORDER — TRAZODONE HCL 50 MG PO TABS
50.0000 mg | ORAL_TABLET | Freq: Every evening | ORAL | Status: DC | PRN
  Administered 2022-06-25 – 2022-06-28 (×4): 50 mg via ORAL
  Filled 2022-06-25 (×4): qty 1

## 2022-06-25 MED ORDER — MORPHINE SULFATE (PF) 2 MG/ML IV SOLN: 1.0000 mg | INTRAVENOUS | Status: DC | PRN

## 2022-06-25 MED ORDER — OXYCODONE HCL 5 MG PO TABS
2.5000 mg | ORAL_TABLET | ORAL | Status: DC | PRN
  Administered 2022-06-25: 5 mg via ORAL
  Filled 2022-06-25: qty 1

## 2022-06-25 MED ORDER — ACETAMINOPHEN 500 MG PO TABS
1000.0000 mg | ORAL_TABLET | Freq: Four times a day (QID) | ORAL | Status: DC
  Administered 2022-06-25 – 2022-06-29 (×16): 1000 mg via ORAL
  Filled 2022-06-25 (×18): qty 2

## 2022-06-25 MED ORDER — LEVOTHYROXINE SODIUM 75 MCG PO TABS
75.0000 ug | ORAL_TABLET | Freq: Every day | ORAL | Status: DC
  Administered 2022-06-25 – 2022-06-29 (×5): 75 ug via ORAL
  Filled 2022-06-25 (×5): qty 1

## 2022-06-25 MED ORDER — DEXTROSE 50 % IV SOLN
25.0000 mL | Freq: Once | INTRAVENOUS | Status: AC
  Administered 2022-06-25: 25 mL via INTRAVENOUS

## 2022-06-25 MED ORDER — ONDANSETRON 4 MG PO TBDP: 4.0000 mg | ORAL_TABLET | Freq: Four times a day (QID) | ORAL | Status: DC | PRN

## 2022-06-25 MED ORDER — LIDOCAINE 5 % EX PTCH
2.0000 | MEDICATED_PATCH | Freq: Every day | CUTANEOUS | Status: DC
  Administered 2022-06-25 – 2022-06-29 (×5): 2 via TRANSDERMAL
  Filled 2022-06-25 (×5): qty 2

## 2022-06-25 MED ORDER — PROTHROMBIN COMPLEX CONC HUMAN 500 UNITS IV KIT
4882.0000 [IU] | PACK | Status: AC
  Administered 2022-06-25: 4882 [IU] via INTRAVENOUS
  Filled 2022-06-25: qty 4366

## 2022-06-25 MED ORDER — METHOCARBAMOL 1000 MG/10ML IJ SOLN: 500.0000 mg | Freq: Three times a day (TID) | INTRAVENOUS | Status: DC | PRN

## 2022-06-25 MED ORDER — MOMETASONE FURO-FORMOTEROL FUM 200-5 MCG/ACT IN AERO
2.0000 | INHALATION_SPRAY | Freq: Two times a day (BID) | RESPIRATORY_TRACT | Status: DC
  Administered 2022-06-25 – 2022-06-28 (×7): 2 via RESPIRATORY_TRACT
  Filled 2022-06-25: qty 8.8

## 2022-06-25 MED ORDER — POTASSIUM CHLORIDE IN NACL 20-0.9 MEQ/L-% IV SOLN
INTRAVENOUS | Status: DC
  Filled 2022-06-25: qty 1000

## 2022-06-25 MED ORDER — ALBUTEROL SULFATE HFA 108 (90 BASE) MCG/ACT IN AERS: 1.0000 | INHALATION_SPRAY | RESPIRATORY_TRACT | Status: DC | PRN

## 2022-06-25 MED ORDER — METHOCARBAMOL 500 MG PO TABS: 500.0000 mg | ORAL_TABLET | Freq: Three times a day (TID) | ORAL | Status: DC | PRN

## 2022-06-25 MED ORDER — INSULIN GLARGINE-YFGN 100 UNIT/ML ~~LOC~~ SOLN
10.0000 [IU] | Freq: Every day | SUBCUTANEOUS | Status: DC
  Administered 2022-06-26 – 2022-06-27 (×2): 10 [IU] via SUBCUTANEOUS
  Filled 2022-06-25 (×5): qty 0.1

## 2022-06-25 MED ORDER — FUROSEMIDE 40 MG PO TABS
80.0000 mg | ORAL_TABLET | Freq: Every day | ORAL | Status: DC
  Administered 2022-06-25 – 2022-06-26 (×2): 80 mg via ORAL
  Filled 2022-06-25 (×2): qty 2

## 2022-06-25 MED ORDER — INSULIN ASPART 100 UNIT/ML IJ SOLN
4.0000 [IU] | Freq: Three times a day (TID) | INTRAMUSCULAR | Status: DC
  Administered 2022-06-25 – 2022-06-29 (×9): 4 [IU] via SUBCUTANEOUS

## 2022-06-25 MED ORDER — FLUTICASONE FUROATE-VILANTEROL 200-25 MCG/ACT IN AEPB
1.0000 | INHALATION_SPRAY | Freq: Two times a day (BID) | RESPIRATORY_TRACT | Status: DC
  Administered 2022-06-25 – 2022-06-28 (×6): 1 via RESPIRATORY_TRACT
  Filled 2022-06-25: qty 28

## 2022-06-25 MED ORDER — CHLORHEXIDINE GLUCONATE CLOTH 2 % EX PADS
6.0000 | MEDICATED_PAD | Freq: Every day | CUTANEOUS | Status: DC
  Administered 2022-06-27 – 2022-06-29 (×3): 6 via TOPICAL

## 2022-06-25 MED ORDER — ONDANSETRON HCL 4 MG/2ML IJ SOLN
4.0000 mg | Freq: Four times a day (QID) | INTRAMUSCULAR | Status: DC | PRN
  Administered 2022-06-25: 4 mg via INTRAVENOUS
  Filled 2022-06-25: qty 2

## 2022-06-25 MED ORDER — OXYCODONE HCL 5 MG PO TABS: 5.0000 mg | ORAL_TABLET | ORAL | Status: DC | PRN

## 2022-06-25 MED ORDER — METOPROLOL TARTRATE 12.5 MG HALF TABLET
12.5000 mg | ORAL_TABLET | Freq: Two times a day (BID) | ORAL | Status: DC
  Administered 2022-06-26 – 2022-06-29 (×6): 12.5 mg via ORAL
  Filled 2022-06-25 (×8): qty 1

## 2022-06-25 MED ORDER — ALBUMIN HUMAN 5 % IV SOLN
25.0000 g | Freq: Once | INTRAVENOUS | Status: AC
  Administered 2022-06-25: 25 g via INTRAVENOUS
  Filled 2022-06-25: qty 500

## 2022-06-25 MED ORDER — DOCUSATE SODIUM 100 MG PO CAPS
100.0000 mg | ORAL_CAPSULE | Freq: Two times a day (BID) | ORAL | Status: DC
  Administered 2022-06-25 – 2022-06-29 (×10): 100 mg via ORAL
  Filled 2022-06-25 (×10): qty 1

## 2022-06-25 MED ORDER — DEXTROSE 50 % IV SOLN
INTRAVENOUS | Status: AC
  Filled 2022-06-25: qty 50

## 2022-06-25 MED ORDER — METHOCARBAMOL 500 MG PO TABS
1000.0000 mg | ORAL_TABLET | Freq: Three times a day (TID) | ORAL | Status: DC
  Administered 2022-06-25 – 2022-06-29 (×14): 1000 mg via ORAL
  Filled 2022-06-25 (×14): qty 2

## 2022-06-25 MED ORDER — CHLORHEXIDINE GLUCONATE CLOTH 2 % EX PADS
6.0000 | MEDICATED_PAD | Freq: Every day | CUTANEOUS | Status: DC
  Administered 2022-06-25: 6 via TOPICAL

## 2022-06-25 MED ORDER — POTASSIUM CHLORIDE CRYS ER 20 MEQ PO TBCR
20.0000 meq | EXTENDED_RELEASE_TABLET | Freq: Every day | ORAL | Status: DC
  Administered 2022-06-25: 20 meq via ORAL
  Filled 2022-06-25 (×2): qty 1

## 2022-06-25 MED ORDER — FENTANYL CITRATE PF 50 MCG/ML IJ SOSY: 25.0000 ug | PREFILLED_SYRINGE | INTRAMUSCULAR | Status: DC | PRN

## 2022-06-25 MED ORDER — INSULIN ASPART 100 UNIT/ML IJ SOLN
0.0000 [IU] | Freq: Three times a day (TID) | INTRAMUSCULAR | Status: DC
  Administered 2022-06-25: 4 [IU] via SUBCUTANEOUS
  Administered 2022-06-25 – 2022-06-26 (×2): 3 [IU] via SUBCUTANEOUS
  Administered 2022-06-26: 4 [IU] via SUBCUTANEOUS
  Administered 2022-06-26: 3 [IU] via SUBCUTANEOUS
  Administered 2022-06-27: 7 [IU] via SUBCUTANEOUS
  Administered 2022-06-27: 3 [IU] via SUBCUTANEOUS
  Administered 2022-06-28 (×2): 4 [IU] via SUBCUTANEOUS

## 2022-06-25 MED ORDER — POTASSIUM CHLORIDE 20 MEQ PO PACK
40.0000 meq | PACK | ORAL | Status: AC
  Administered 2022-06-25 (×3): 40 meq via ORAL
  Filled 2022-06-25 (×3): qty 2

## 2022-06-25 MED ORDER — ORAL CARE MOUTH RINSE: 15.0000 mL | OROMUCOSAL | Status: DC | PRN

## 2022-06-25 MED ORDER — SODIUM CHLORIDE 1 G PO TABS
1.0000 g | ORAL_TABLET | Freq: Three times a day (TID) | ORAL | Status: DC
  Administered 2022-06-25 – 2022-06-29 (×14): 1 g via ORAL
  Filled 2022-06-25 (×14): qty 1
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
## 2022-06-25 NOTE — ED Notes (Signed)
Browning PA upgraded pt to Level 1 based on hypotension. 1L NS bolus infusing.
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
## 2022-06-25 NOTE — ED Notes (Signed)
Donell Beers Trauma MD at bedside
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
## 2022-06-26 LAB — BASIC METABOLIC PANEL
Anion gap: 11 (ref 5–15)
BUN: 38 mg/dL — ABNORMAL HIGH (ref 8–23)
CO2: 21 mmol/L — ABNORMAL LOW (ref 22–32)
Calcium: 8.4 mg/dL — ABNORMAL LOW (ref 8.9–10.3)
Chloride: 98 mmol/L (ref 98–111)
Creatinine, Ser: 2.46 mg/dL — ABNORMAL HIGH (ref 0.61–1.24)
GFR, Estimated: 25 mL/min — ABNORMAL LOW (ref >60)
Glucose, Bld: 220 mg/dL — ABNORMAL HIGH (ref 70–99)
Potassium: 5.3 mmol/L — ABNORMAL HIGH (ref 3.5–5.1)
Sodium: 130 mmol/L — ABNORMAL LOW (ref 135–145)

## 2022-06-26 LAB — GLUCOSE, CAPILLARY
Glucose-Capillary: 142 mg/dL — ABNORMAL HIGH (ref 70–99)
Glucose-Capillary: 148 mg/dL — ABNORMAL HIGH (ref 70–99)
Glucose-Capillary: 179 mg/dL — ABNORMAL HIGH (ref 70–99)
Glucose-Capillary: 183 mg/dL — ABNORMAL HIGH (ref 70–99)
Glucose-Capillary: 97 mg/dL (ref 70–99)

## 2022-06-26 MED ORDER — ENOXAPARIN SODIUM 30 MG/0.3ML IJ SOSY: 30.0000 mg | PREFILLED_SYRINGE | INTRAMUSCULAR | Status: DC

## 2022-06-26 MED ORDER — OXYCODONE HCL 5 MG PO TABS
2.5000 mg | ORAL_TABLET | ORAL | Status: DC | PRN
  Administered 2022-06-26 – 2022-06-29 (×7): 5 mg via ORAL
  Filled 2022-06-26 (×7): qty 1

## 2022-06-26 MED ORDER — ENOXAPARIN SODIUM 30 MG/0.3ML IJ SOSY
30.0000 mg | PREFILLED_SYRINGE | Freq: Two times a day (BID) | INTRAMUSCULAR | Status: DC
  Administered 2022-06-26: 30 mg via SUBCUTANEOUS
  Filled 2022-06-26: qty 0.3
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
## 2022-06-27 LAB — CBC
HCT: 22.3 % — ABNORMAL LOW (ref 39.0–52.0)
HCT: 25.7 % — ABNORMAL LOW (ref 39.0–52.0)
Hemoglobin: 6.8 g/dL — CL (ref 13.0–17.0)
Hemoglobin: 8 g/dL — ABNORMAL LOW (ref 13.0–17.0)
MCH: 30.1 pg (ref 26.0–34.0)
MCH: 30.1 pg (ref 26.0–34.0)
MCHC: 30.5 g/dL (ref 30.0–36.0)
MCHC: 31.1 g/dL (ref 30.0–36.0)
MCV: 98.7 fL (ref 80.0–100.0)
MCV: 96.6 fL (ref 80.0–100.0)
Platelets: 109 10*3/uL — ABNORMAL LOW (ref 150–400)
Platelets: 120 10*3/uL — ABNORMAL LOW (ref 150–400)
RBC: 2.26 MIL/uL — ABNORMAL LOW (ref 4.22–5.81)
RBC: 2.66 MIL/uL — ABNORMAL LOW (ref 4.22–5.81)
RDW: 15.5 % (ref 11.5–15.5)
RDW: 15.3 % (ref 11.5–15.5)
WBC: 6.8 10*3/uL (ref 4.0–10.5)
WBC: 7.5 10*3/uL (ref 4.0–10.5)
nRBC: 0 % (ref 0.0–0.2)
nRBC: 0 % (ref 0.0–0.2)

## 2022-06-27 LAB — BASIC METABOLIC PANEL
Anion gap: 15 (ref 5–15)
BUN: 46 mg/dL — ABNORMAL HIGH (ref 8–23)
CO2: 21 mmol/L — ABNORMAL LOW (ref 22–32)
Calcium: 8.5 mg/dL — ABNORMAL LOW (ref 8.9–10.3)
Chloride: 98 mmol/L (ref 98–111)
Creatinine, Ser: 2.6 mg/dL — ABNORMAL HIGH (ref 0.61–1.24)
GFR, Estimated: 23 mL/min — ABNORMAL LOW (ref >60)
Glucose, Bld: 154 mg/dL — ABNORMAL HIGH (ref 70–99)
Potassium: 4.9 mmol/L (ref 3.5–5.1)
Sodium: 134 mmol/L — ABNORMAL LOW (ref 135–145)

## 2022-06-27 LAB — GLUCOSE, CAPILLARY
Glucose-Capillary: 136 mg/dL — ABNORMAL HIGH (ref 70–99)
Glucose-Capillary: 143 mg/dL — ABNORMAL HIGH (ref 70–99)
Glucose-Capillary: 144 mg/dL — ABNORMAL HIGH (ref 70–99)
Glucose-Capillary: 170 mg/dL — ABNORMAL HIGH (ref 70–99)
Glucose-Capillary: 202 mg/dL — ABNORMAL HIGH (ref 70–99)
Glucose-Capillary: 61 mg/dL — ABNORMAL LOW (ref 70–99)
Glucose-Capillary: 95 mg/dL (ref 70–99)

## 2022-06-27 LAB — BPAM RBC
Blood Product Expiration Date: 202405212359
ISSUE DATE / TIME: 202404221217

## 2022-06-27 LAB — EXPECTORATED SPUTUM ASSESSMENT W GRAM STAIN, RFLX TO RESP C

## 2022-06-27 LAB — TYPE AND SCREEN

## 2022-06-27 LAB — CULTURE, RESPIRATORY W GRAM STAIN

## 2022-06-27 LAB — PREPARE RBC (CROSSMATCH)

## 2022-06-27 MED ORDER — IPRATROPIUM-ALBUTEROL 0.5-2.5 (3) MG/3ML IN SOLN: 3.0000 mL | Freq: Four times a day (QID) | RESPIRATORY_TRACT | Status: DC | PRN

## 2022-06-27 MED ORDER — BACITRACIN ZINC 500 UNIT/GM EX OINT
TOPICAL_OINTMENT | Freq: Two times a day (BID) | CUTANEOUS | Status: DC
  Filled 2022-06-27: qty 28.4

## 2022-06-27 MED ORDER — MORPHINE SULFATE (PF) 2 MG/ML IV SOLN: 1.0000 mg | INTRAVENOUS | Status: DC | PRN

## 2022-06-27 MED ORDER — GUAIFENESIN ER 600 MG PO TB12
600.0000 mg | ORAL_TABLET | Freq: Two times a day (BID) | ORAL | Status: DC
  Administered 2022-06-27 – 2022-06-29 (×5): 600 mg via ORAL
  Filled 2022-06-27 (×5): qty 1

## 2022-06-27 MED ORDER — SODIUM CHLORIDE 0.9% IV SOLUTION: Freq: Once | INTRAVENOUS | Status: AC

## 2022-06-27 MED ORDER — IPRATROPIUM-ALBUTEROL 0.5-2.5 (3) MG/3ML IN SOLN
3.0000 mL | Freq: Once | RESPIRATORY_TRACT | Status: AC
  Administered 2022-06-27: 3 mL via RESPIRATORY_TRACT
  Filled 2022-06-27: qty 3

## 2022-06-27 MED ORDER — POLYETHYLENE GLYCOL 3350 17 G PO PACK
17.0000 g | PACK | Freq: Every day | ORAL | Status: DC
  Administered 2022-06-27: 17 g via ORAL
  Filled 2022-06-27: qty 1

## 2022-06-27 NOTE — Progress Notes (Signed)
Flutter valve education provided to pt. Pt able to demonstrate correct use in return.
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
## 2022-06-27 NOTE — Progress Notes (Signed)
Mobility Specialist Progress Note   06/27/22 1520  Mobility  Activity Ambulated with assistance in hallway  Level of Assistance Standby assist, set-up cues, supervision of patient - no hands on  Assistive Device Front wheel walker  Distance Ambulated (ft) 120 ft  Range of Motion/Exercises Active;All extremities  Activity Response Tolerated well   Patient received in supine and agreeable to participate. Completed bed mobility independently and ambulated with supervision and slow steady gait. Prior to ambulation, oxygen desaturated on RA while at rest requiring 1LO2 to maintain >90% throughout. Also completed education on pursed lip breathing with poor carryover. Stated he's always been a "mouth breather". Returned to room without complaint or incident. Was left in recliner with all needs met, call bell in reach.   [place] Thersia Petraglia, BS EXP Mobility Specialist Please contact via SecureChat or Rehab office at [phone]
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
## 2022-06-27 NOTE — Progress Notes (Signed)
Hypoglycemic Event  CBG: 61   Treatment: 8 oz juice/soda  Symptoms: None  Follow-up CBG: Time:1730 CBG Result:95  Possible Reasons for Event: Inadequate meal intake     Jeralene Peters

## 2022-06-27 NOTE — Progress Notes (Signed)
Orthopedic Tech Progress Note Patient Details:  Jesus Oconnor 1933/04/27 [id number]  Ortho Devices Type of Ortho Device: Cotton web roll, Ace wrap, Ulna gutter splint Ortho Device/Splint Location: RUE Ortho Device/Splint Interventions: Ordered, Application, Adjustment   Post Interventions Patient Tolerated: Well Instructions Provided: Care of device  [name] 06/27/2022, 12:14 PM

## 2022-06-28 LAB — CBC
HCT: 23.1 % — ABNORMAL LOW (ref 39.0–52.0)
Hemoglobin: 7.4 g/dL — ABNORMAL LOW (ref 13.0–17.0)
MCH: 30.2 pg (ref 26.0–34.0)
MCHC: 32 g/dL (ref 30.0–36.0)
MCV: 94.3 fL (ref 80.0–100.0)
Platelets: 109 10*3/uL — ABNORMAL LOW (ref 150–400)
RBC: 2.45 MIL/uL — ABNORMAL LOW (ref 4.22–5.81)
RDW: 15.5 % (ref 11.5–15.5)
WBC: 6.6 10*3/uL (ref 4.0–10.5)
nRBC: 0 % (ref 0.0–0.2)

## 2022-06-28 LAB — TYPE AND SCREEN
ABO/RH(D): A POS
Antibody Screen: NEGATIVE
Unit division: 0

## 2022-06-28 LAB — BASIC METABOLIC PANEL
Anion gap: 11 (ref 5–15)
BUN: 41 mg/dL — ABNORMAL HIGH (ref 8–23)
CO2: 22 mmol/L (ref 22–32)
Calcium: 8.4 mg/dL — ABNORMAL LOW (ref 8.9–10.3)
Chloride: 103 mmol/L (ref 98–111)
Creatinine, Ser: 2.18 mg/dL — ABNORMAL HIGH (ref 0.61–1.24)
GFR, Estimated: 29 mL/min — ABNORMAL LOW (ref >60)
Glucose, Bld: 126 mg/dL — ABNORMAL HIGH (ref 70–99)
Potassium: 4.7 mmol/L (ref 3.5–5.1)
Sodium: 136 mmol/L (ref 135–145)

## 2022-06-28 LAB — GLUCOSE, CAPILLARY
Glucose-Capillary: 152 mg/dL — ABNORMAL HIGH (ref 70–99)
Glucose-Capillary: 152 mg/dL — ABNORMAL HIGH (ref 70–99)
Glucose-Capillary: 98 mg/dL (ref 70–99)
Glucose-Capillary: 156 mg/dL — ABNORMAL HIGH (ref 70–99)

## 2022-06-28 LAB — CULTURE, RESPIRATORY W GRAM STAIN

## 2022-06-28 LAB — BPAM RBC: Unit Type and Rh: 6200

## 2022-06-28 MED ORDER — MAGNESIUM HYDROXIDE 400 MG/5ML PO SUSP: 30.0000 mL | Freq: Once | ORAL | Status: DC

## 2022-06-28 MED ORDER — BISACODYL 5 MG PO TBEC
10.0000 mg | DELAYED_RELEASE_TABLET | Freq: Once | ORAL | Status: AC
  Administered 2022-06-28: 10 mg via ORAL
  Filled 2022-06-28: qty 2

## 2022-06-28 MED ORDER — MAGNESIUM CITRATE PO SOLN
1.0000 | Freq: Once | ORAL | Status: DC
  Filled 2022-06-28: qty 296

## 2022-06-28 MED ORDER — SODIUM CHLORIDE 0.9 % IV SOLN
2.0000 g | Freq: Two times a day (BID) | INTRAVENOUS | Status: DC
  Administered 2022-06-28 – 2022-06-29 (×3): 2 g via INTRAVENOUS
  Filled 2022-06-28 (×3): qty 12.5

## 2022-06-28 MED ORDER — SENNA 8.6 MG PO TABS
2.0000 | ORAL_TABLET | Freq: Once | ORAL | Status: AC
  Administered 2022-06-28: 17.2 mg via ORAL
  Filled 2022-06-28: qty 2

## 2022-06-28 MED ORDER — SODIUM CHLORIDE 0.9 % IV SOLN: 2.0000 g | INTRAVENOUS | Status: DC

## 2022-06-28 MED ORDER — BISACODYL 10 MG RE SUPP: 10.0000 mg | Freq: Once | RECTAL | Status: DC

## 2022-06-28 MED ORDER — INSULIN GLARGINE-YFGN 100 UNIT/ML ~~LOC~~ SOLN
8.0000 [IU] | Freq: Every day | SUBCUTANEOUS | Status: DC
  Administered 2022-06-28: 8 [IU] via SUBCUTANEOUS
  Filled 2022-06-28 (×2): qty 0.08

## 2022-06-28 MED ORDER — POLYETHYLENE GLYCOL 3350 17 G PO PACK
17.0000 g | PACK | Freq: Two times a day (BID) | ORAL | Status: DC
  Administered 2022-06-28 – 2022-06-29 (×3): 17 g via ORAL
  Filled 2022-06-28 (×3): qty 1

## 2022-06-28 NOTE — Progress Notes (Addendum)
Noted swelling to R flank and increased bruising. Leary Roca, PA-C  notified. No new orders at this time.

## 2022-06-28 NOTE — Care Management Important Message (Signed)
Important Message  Patient Details  Name: Jesus Oconnor MRN: [id number] Date of Birth: 05-07-1933   Medicare Important Message Given:  Yes     Sherilyn Banker 06/28/2022, 1:30 PM

## 2022-06-28 NOTE — Inpatient Diabetes Management (Signed)
Inpatient Diabetes Program Recommendations  AACE/ADA: New Consensus Statement on Inpatient Glycemic Control (2015)  Target Ranges:  Prepandial:   less than 140 mg/dL      Peak postprandial:   less than 180 mg/dL (1-2 hours)      Critically ill patients:  140 - 180 mg/dL   Lab Results  Component Value Date   GLUCAP 152 (H) 06/28/2022   HGBA1C 6.4 (H) 12/27/2021    Review of Glycemic Control  Latest Reference Range & Units 06/27/22 08:11 06/27/22 11:26 06/27/22 17:05 06/27/22 17:33 06/27/22 20:57 06/28/22 07:54  Glucose-Capillary 70 - 99 mg/dL 161 (H) 096 (H) 61 (L) 95 170 (H) 152 (H)  (H): Data is abnormally high (L): Data is abnormally low  Diabetes history: DM2 Outpatient Diabetes medications: Lantus 35 units QHS if > 200 mg/dL Current orders for Inpatient glycemic control: Semglee 8 unit QD, Novolog 0-20 units TID and 0-5 units QHS  Inpatient Diabetes Program Recommendations:    Please consider decreasing correction to Novolog 0-15 units TID  Will continue to follow while inpatient.  Thank you, Dulce Sellar, MSN, CDCES Diabetes Coordinator Inpatient Diabetes Program [phone] (team pager from 8a-5p)
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
## 2022-06-28 NOTE — Progress Notes (Signed)
Orthopedic Tech Progress Note Patient Details:  Jesus Oconnor Oct 05, 1933 [id number]  Spoke with TRAUMA and I was told to just buddy tape fingers  Ortho Devices Type of Ortho Device: Buddy tape Ortho Device/Splint Location: RUE (4TH,5TH FINGERS) Ortho Device/Splint Interventions: Ordered, Application, Adjustment   Post Interventions Patient Tolerated: Well Instructions Provided: Care of device  [name] 06/28/2022, 12:27 PM
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
## 2022-06-28 NOTE — Progress Notes (Signed)
Mobility Specialist Progress Note   06/28/22 1533  Mobility  Activity Ambulated with assistance in hallway  Level of Assistance Standby assist, set-up cues, supervision of patient - no hands on  Assistive Device Front wheel walker  Distance Ambulated (ft) 150 ft  Range of Motion/Exercises Active;All extremities  Activity Response Tolerated well   Patient received in recliner and agreeable to participate. Required min A to stand from low surface of recliner chair. Ambulated supervision level with slow steady gait. Still required 1LO2 to maintain an oxygen saturation >92%. Returned to room without complaint or incident. Was left in recliner with all needs met, call bell in reach.   [place] Kanani Mowbray, BS EXP Mobility Specialist Please contact via SecureChat or Rehab office at [phone]

## 2022-06-29 ENCOUNTER — Other Ambulatory Visit (HOSPITAL_COMMUNITY): Payer: Self-pay

## 2022-06-29 LAB — BASIC METABOLIC PANEL WITH GFR
Anion gap: 7 (ref 5–15)
BUN: 36 mg/dL — ABNORMAL HIGH (ref 8–23)
CO2: 22 mmol/L (ref 22–32)
Calcium: 8.2 mg/dL — ABNORMAL LOW (ref 8.9–10.3)
Chloride: 107 mmol/L (ref 98–111)
Creatinine, Ser: 1.84 mg/dL — ABNORMAL HIGH (ref 0.61–1.24)
GFR, Estimated: 35 mL/min — ABNORMAL LOW
Glucose, Bld: 122 mg/dL — ABNORMAL HIGH (ref 70–99)
Potassium: 4.1 mmol/L (ref 3.5–5.1)
Sodium: 136 mmol/L (ref 135–145)

## 2022-06-29 LAB — CBC
HCT: 24 % — ABNORMAL LOW (ref 39.0–52.0)
Hemoglobin: 7.4 g/dL — ABNORMAL LOW (ref 13.0–17.0)
MCH: 30.2 pg (ref 26.0–34.0)
MCHC: 30.8 g/dL (ref 30.0–36.0)
MCV: 98 fL (ref 80.0–100.0)
Platelets: 128 K/uL — ABNORMAL LOW (ref 150–400)
RBC: 2.45 MIL/uL — ABNORMAL LOW (ref 4.22–5.81)
RDW: 15.6 % — ABNORMAL HIGH (ref 11.5–15.5)
WBC: 6.1 K/uL (ref 4.0–10.5)
nRBC: 0 % (ref 0.0–0.2)

## 2022-06-29 LAB — CULTURE, RESPIRATORY W GRAM STAIN

## 2022-06-29 LAB — GLUCOSE, CAPILLARY
Glucose-Capillary: 143 mg/dL — ABNORMAL HIGH (ref 70–99)
Glucose-Capillary: 230 mg/dL — ABNORMAL HIGH (ref 70–99)

## 2022-06-29 MED ORDER — AMOXICILLIN-POT CLAVULANATE 875-125 MG PO TABS
1.0000 | ORAL_TABLET | Freq: Two times a day (BID) | ORAL | 0 refills | Status: DC
  Filled 2022-06-29: qty 10, 5d supply, fill #0

## 2022-06-29 MED ORDER — OXYCODONE HCL 5 MG PO TABS
2.5000 mg | ORAL_TABLET | Freq: Four times a day (QID) | ORAL | 0 refills | Status: DC | PRN
  Filled 2022-06-29: qty 15, 4d supply, fill #0

## 2022-06-29 MED ORDER — GUAIFENESIN ER 600 MG PO TB12: 600.0000 mg | ORAL_TABLET | Freq: Two times a day (BID) | ORAL | Status: DC | PRN

## 2022-06-29 MED ORDER — INSULIN ASPART 100 UNIT/ML IJ SOLN
0.0000 [IU] | Freq: Three times a day (TID) | INTRAMUSCULAR | Status: DC
  Administered 2022-06-29: 5 [IU] via SUBCUTANEOUS

## 2022-06-29 MED ORDER — POLYETHYLENE GLYCOL 3350 17 G PO PACK: 17.0000 g | PACK | Freq: Every day | ORAL | 0 refills | Status: DC | PRN

## 2022-06-29 MED ORDER — METHOCARBAMOL 500 MG PO TABS
1000.0000 mg | ORAL_TABLET | Freq: Three times a day (TID) | ORAL | 0 refills | Status: DC | PRN
  Filled 2022-06-29: qty 30, 5d supply, fill #0

## 2022-06-29 MED ORDER — DOCUSATE SODIUM 100 MG PO CAPS: 100.0000 mg | ORAL_CAPSULE | Freq: Two times a day (BID) | ORAL | 0 refills | Status: DC | PRN

## 2022-06-29 MED ORDER — ACETAMINOPHEN 500 MG PO TABS: 1000.0000 mg | ORAL_TABLET | Freq: Three times a day (TID) | ORAL | 0 refills | Status: AC | PRN

## 2022-06-29 MED ORDER — AZITHROMYCIN 500 MG PO TABS
500.0000 mg | ORAL_TABLET | Freq: Every day | ORAL | 0 refills | Status: DC
  Filled 2022-06-29: qty 3, 3d supply, fill #0

## 2022-06-29 MED ORDER — BACITRACIN ZINC 500 UNIT/GM EX OINT: TOPICAL_OINTMENT | Freq: Two times a day (BID) | CUTANEOUS | 0 refills | Status: DC

## 2022-06-29 NOTE — TOC Transition Note (Signed)
Transition of Care [facility]) - CM/SW Discharge Note   Patient Details  Name: Jesus Oconnor MRN: [id number] Date of Birth: [birth year]  Transition of Care [facility]) CM/SW Contact:  Glennon Mac, RN Phone Number: 06/29/2022, 2:52 PM   Clinical Narrative:    Patient medically stable for discharge to brother's home today, where he will have 24/7 supervision.  He will eventually return to his own home.  He is agreeable to receiving HH services at home for therapies; referral to [facility] for HHPT/OT.  No DME needed, per therapies/patient.     Final next level of care: Home w Home Health Services Barriers to Discharge: Barriers Resolved   Patient Goals and CMS Choice CMS Medicare.gov Compare Post Acute Care list provided to:: Patient Represenative (must comment) (granddaughter)                            Discharge Plan and Services Additional resources added to the After Visit Summary for     Discharge Planning Services: CM Consult Post Acute Care Choice: Home Health                    HH Arranged: PT, OT [facility] Agency: [facility] Date [facility] Agency Contacted: 06/29/22 Time HH Agency Contacted: 1452 Representative spoke with at [facility] Agency: Lorenza Chick  Social Determinants of Health (SDOH) Interventions SDOH Screenings   Food Insecurity: No Food Insecurity (12/21/2020)  Housing: Medium Risk (12/21/2020)  Transportation Needs: No Transportation Needs (12/21/2020)  Tobacco Use: Medium Risk (12/27/2021)     Readmission Risk Interventions     No data to display         Quintella Baton, RN, BSN  Trauma/Neuro ICU Case Manager [phone]

## 2022-06-29 NOTE — Progress Notes (Signed)
Pt with orders to d/c home. PIV removed. Pt is stable. Discharge education and packet provided to pt and brother at bedside. Prescriptions delivered to pt room. Pt and all belongings transported via wheelchair without incident.
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
## 2022-06-29 NOTE — Progress Notes (Signed)
SATURATION QUALIFICATIONS: (This note is used to comply with regulatory documentation for home oxygen)  Patient Saturations on Room Air at Rest = 99%  Patient Saturations on Room Air while Ambulating = 93%  Patient demonstrated no need for home oxygen.    Jerolyn Center, PT Acute Rehabilitation Services  Office [phone]
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
## 2022-06-30 LAB — EXPECTORATED SPUTUM ASSESSMENT W GRAM STAIN, RFLX TO RESP C

## 2022-07-01 ENCOUNTER — Telehealth: Payer: Self-pay | Admitting: Cardiovascular Disease
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
## 2022-07-05 DIAGNOSIS — Z9989 Dependence on other enabling machines and devices: Secondary | ICD-10-CM | POA: Diagnosis not present

## 2022-07-05 DIAGNOSIS — S62346A Nondisplaced fracture of base of fifth metacarpal bone, right hand, initial encounter for closed fracture: Secondary | ICD-10-CM | POA: Diagnosis not present

## 2022-07-05 DIAGNOSIS — Z7901 Long term (current) use of anticoagulants: Secondary | ICD-10-CM | POA: Diagnosis not present

## 2022-07-05 DIAGNOSIS — E1121 Type 2 diabetes mellitus with diabetic nephropathy: Secondary | ICD-10-CM | POA: Diagnosis not present

## 2022-07-05 DIAGNOSIS — J449 Chronic obstructive pulmonary disease, unspecified: Secondary | ICD-10-CM | POA: Diagnosis not present

## 2022-07-05 DIAGNOSIS — I77810 Thoracic aortic ectasia: Secondary | ICD-10-CM | POA: Diagnosis not present

## 2022-07-06 LAB — LAB REPORT - SCANNED
A1c: 6.8
Creatinine, POC: 22 mg/dL
EGFR: 32
Microalb Creat Ratio: 303.2
Microalbumin, Urine: 6.6

## 2022-07-07 ENCOUNTER — Encounter: Payer: Self-pay | Admitting: Nurse Practitioner

## 2022-07-07 ENCOUNTER — Ambulatory Visit: Payer: Medicare Other | Attending: Nurse Practitioner | Admitting: Nurse Practitioner

## 2022-07-07 VITALS — BP 102/48 | HR 68 | Ht 71.0 in | Wt 211.6 lb

## 2022-07-07 DIAGNOSIS — I7123 Aneurysm of the descending thoracic aorta, without rupture: Secondary | ICD-10-CM

## 2022-07-07 DIAGNOSIS — E118 Type 2 diabetes mellitus with unspecified complications: Secondary | ICD-10-CM

## 2022-07-07 DIAGNOSIS — N183 Chronic kidney disease, stage 3 unspecified: Secondary | ICD-10-CM | POA: Diagnosis not present

## 2022-07-07 DIAGNOSIS — Z794 Long term (current) use of insulin: Secondary | ICD-10-CM

## 2022-07-07 DIAGNOSIS — I10 Essential (primary) hypertension: Secondary | ICD-10-CM | POA: Diagnosis not present

## 2022-07-07 DIAGNOSIS — I4819 Other persistent atrial fibrillation: Secondary | ICD-10-CM | POA: Diagnosis not present

## 2022-07-07 DIAGNOSIS — I442 Atrioventricular block, complete: Secondary | ICD-10-CM | POA: Diagnosis not present

## 2022-07-07 DIAGNOSIS — I5042 Chronic combined systolic (congestive) and diastolic (congestive) heart failure: Secondary | ICD-10-CM

## 2022-07-07 DIAGNOSIS — I2510 Atherosclerotic heart disease of native coronary artery without angina pectoris: Secondary | ICD-10-CM | POA: Diagnosis not present

## 2022-07-07 DIAGNOSIS — E785 Hyperlipidemia, unspecified: Secondary | ICD-10-CM | POA: Diagnosis not present

## 2022-07-07 DIAGNOSIS — D500 Iron deficiency anemia secondary to blood loss (chronic): Secondary | ICD-10-CM

## 2022-07-07 MED ORDER — METOPROLOL SUCCINATE ER 25 MG PO TB24: 25.0000 mg | ORAL_TABLET | Freq: Every day | ORAL | 3 refills | Status: DC

## 2022-07-07 NOTE — Patient Instructions (Addendum)
Medication Instructions:  Stop Hydralazine as directed.  Decrease Metoprolol Succinate 25 mg daily.  Lasix (furosemide) take 80 mg in the morning and 40 mg in the evening for 3 days. Then resume 80 mg daily.   *If you need a refill on your cardiac medications before your next appointment, please call your pharmacy*   Lab Work: Your physician recommends that you return for lab work in 1 week. CBC, BMET  If you have labs (blood work) drawn today and your tests are completely normal, you will receive your results only by: MyChart Message (if you have MyChart) OR A paper copy in the mail If you have any lab test that is abnormal or we need to change your treatment, we will call you to review the results.   Testing/Procedures: NONE ordered at this time of appointment     Follow-Up: At [facility], you and your health needs are our priority.  As part of our continuing mission to provide you with exceptional heart care, we have created designated Provider Care Teams.  These Care Teams include your primary Cardiologist (physician) and Advanced Practice Providers (APPs -  Physician Assistants and Nurse Practitioners) who all work together to provide you with the care you need, when you need it.  We recommend signing up for the patient portal called "MyChart".  Sign up information is provided on this After Visit Summary.  MyChart is used to connect with patients for Virtual Visits (Telemedicine).  Patients are able to view lab/test results, encounter notes, upcoming appointments, etc.  Non-urgent messages can be sent to your provider as well.   To learn more about what you can do with MyChart, go to [email or web address].    Your next appointment:   2 week(s)  Provider:   Bernadene Person, NP        Other Instructions Please call the device clinic (remote transmission for pacemaker). Please follow up with your kidney doctor.
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
## 2022-07-11 ENCOUNTER — Ambulatory Visit (INDEPENDENT_AMBULATORY_CARE_PROVIDER_SITE_OTHER): Payer: Medicare Other

## 2022-07-11 DIAGNOSIS — I442 Atrioventricular block, complete: Secondary | ICD-10-CM

## 2022-07-12 LAB — CUP PACEART REMOTE DEVICE CHECK
Battery Impedance: 2068 Ohm
Battery Remaining Longevity: 37 mo
Battery Voltage: 2.75 V
Brady Statistic RV Percent Paced: 97 %
Date Time Interrogation Session: 20240506190700
Implantable Lead Connection Status: 753985
Implantable Lead Connection Status: 753985
Implantable Lead Implant Date: 20140318
Implantable Lead Implant Date: 20140318
Implantable Lead Location: 753859
Implantable Lead Location: 753860
Implantable Lead Model: 5076
Implantable Lead Model: 5076
Implantable Pulse Generator Implant Date: 20140318
Lead Channel Impedance Value: 605 Ohm
Lead Channel Impedance Value: 67 Ohm
Lead Channel Pacing Threshold Amplitude: 0.875 V
Lead Channel Pacing Threshold Pulse Width: 0.4 ms
Lead Channel Setting Pacing Amplitude: 2.5 V
Lead Channel Setting Pacing Pulse Width: 0.4 ms
Lead Channel Setting Sensing Sensitivity: 2.8 mV
Zone Setting Status: 755011
Zone Setting Status: 755011

## 2022-07-13 ENCOUNTER — Telehealth: Payer: Self-pay | Admitting: Cardiovascular Disease

## 2022-07-13 DIAGNOSIS — Z79899 Other long term (current) drug therapy: Secondary | ICD-10-CM

## 2022-07-13 NOTE — Telephone Encounter (Signed)
Daughter asking about what kind of lab work is ordered for Friday- BMP and CBC are ordered. She was wondering if a TSH can be added- her father has been very cold- and wondering if thyroid needs to be checked.   Told her that I would ask the provider and get back with her.

## 2022-07-13 NOTE — Telephone Encounter (Signed)
Informed that the provider approved adding a TSH to labwork; will draw CBC, BMP, TSH . She verbalized understanding.

## 2022-07-13 NOTE — Telephone Encounter (Signed)
Daughter calling to see if a renal panel can be added to patient lab work. Please advise

## 2022-07-15 ENCOUNTER — Encounter: Payer: Self-pay | Admitting: Family Medicine

## 2022-07-15 ENCOUNTER — Other Ambulatory Visit: Payer: Self-pay | Admitting: *Deleted

## 2022-07-15 DIAGNOSIS — I7123 Aneurysm of the descending thoracic aorta, without rupture: Secondary | ICD-10-CM | POA: Diagnosis not present

## 2022-07-15 DIAGNOSIS — I2510 Atherosclerotic heart disease of native coronary artery without angina pectoris: Secondary | ICD-10-CM | POA: Diagnosis not present

## 2022-07-15 DIAGNOSIS — Z79899 Other long term (current) drug therapy: Secondary | ICD-10-CM

## 2022-07-15 DIAGNOSIS — I4819 Other persistent atrial fibrillation: Secondary | ICD-10-CM | POA: Diagnosis not present

## 2022-07-15 DIAGNOSIS — I5042 Chronic combined systolic (congestive) and diastolic (congestive) heart failure: Secondary | ICD-10-CM | POA: Diagnosis not present

## 2022-07-15 DIAGNOSIS — I442 Atrioventricular block, complete: Secondary | ICD-10-CM | POA: Diagnosis not present

## 2022-07-15 LAB — TSH: TSH: 3.3 u[IU]/mL (ref 0.450–4.500)

## 2022-07-15 LAB — CBC

## 2022-07-16 LAB — BASIC METABOLIC PANEL
BUN/Creatinine Ratio: 17 (ref 10–24)
BUN: 34 mg/dL — ABNORMAL HIGH (ref 8–27)
CO2: 19 mmol/L — ABNORMAL LOW (ref 20–29)
Calcium: 8.8 mg/dL (ref 8.6–10.2)
Chloride: 96 mmol/L (ref 96–106)
Creatinine, Ser: 1.97 mg/dL — ABNORMAL HIGH (ref 0.76–1.27)
Glucose: 123 mg/dL — ABNORMAL HIGH (ref 70–99)
Potassium: 4.4 mmol/L (ref 3.5–5.2)
Sodium: 135 mmol/L (ref 134–144)
eGFR: 32 mL/min/{1.73_m2} — ABNORMAL LOW (ref >59)

## 2022-07-16 LAB — CBC
Hematocrit: 29.9 % — ABNORMAL LOW (ref 37.5–51.0)
Hemoglobin: 9.7 g/dL — ABNORMAL LOW (ref 13.0–17.7)
MCH: 30.2 pg (ref 26.6–33.0)
MCV: 93 fL (ref 79–97)
RBC: 3.21 x10E6/uL — ABNORMAL LOW (ref 4.14–5.80)
WBC: 4.7 10*3/uL (ref 3.4–10.8)

## 2022-07-18 ENCOUNTER — Telehealth: Payer: Self-pay

## 2022-07-18 NOTE — Telephone Encounter (Signed)
Spoke with pt. Pt was notified of lab results. Pt will resume Eliquis 2.5 mg bid as discussed by Dr. Rennis Golden.

## 2022-07-20 DIAGNOSIS — E11622 Type 2 diabetes mellitus with other skin ulcer: Secondary | ICD-10-CM | POA: Diagnosis not present

## 2022-07-20 DIAGNOSIS — L97211 Non-pressure chronic ulcer of right calf limited to breakdown of skin: Secondary | ICD-10-CM | POA: Diagnosis not present

## 2022-07-20 DIAGNOSIS — I509 Heart failure, unspecified: Secondary | ICD-10-CM | POA: Diagnosis not present

## 2022-07-20 DIAGNOSIS — J449 Chronic obstructive pulmonary disease, unspecified: Secondary | ICD-10-CM | POA: Diagnosis not present

## 2022-07-20 DIAGNOSIS — E1121 Type 2 diabetes mellitus with diabetic nephropathy: Secondary | ICD-10-CM | POA: Diagnosis not present
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
## 2022-07-22 ENCOUNTER — Ambulatory Visit: Payer: Medicare Other | Attending: Nurse Practitioner | Admitting: Nurse Practitioner

## 2022-07-22 ENCOUNTER — Telehealth: Payer: Self-pay | Admitting: Nurse Practitioner

## 2022-07-22 VITALS — BP 100/60 | HR 75 | Ht 71.0 in | Wt 201.6 lb

## 2022-07-22 DIAGNOSIS — N183 Chronic kidney disease, stage 3 unspecified: Secondary | ICD-10-CM | POA: Diagnosis not present

## 2022-07-22 DIAGNOSIS — I442 Atrioventricular block, complete: Secondary | ICD-10-CM | POA: Diagnosis not present

## 2022-07-22 DIAGNOSIS — Z794 Long term (current) use of insulin: Secondary | ICD-10-CM

## 2022-07-22 DIAGNOSIS — I5042 Chronic combined systolic (congestive) and diastolic (congestive) heart failure: Secondary | ICD-10-CM

## 2022-07-22 DIAGNOSIS — E785 Hyperlipidemia, unspecified: Secondary | ICD-10-CM

## 2022-07-22 DIAGNOSIS — I7123 Aneurysm of the descending thoracic aorta, without rupture: Secondary | ICD-10-CM | POA: Diagnosis not present

## 2022-07-22 DIAGNOSIS — I10 Essential (primary) hypertension: Secondary | ICD-10-CM | POA: Diagnosis not present

## 2022-07-22 DIAGNOSIS — E118 Type 2 diabetes mellitus with unspecified complications: Secondary | ICD-10-CM | POA: Diagnosis not present

## 2022-07-22 DIAGNOSIS — I2510 Atherosclerotic heart disease of native coronary artery without angina pectoris: Secondary | ICD-10-CM

## 2022-07-22 DIAGNOSIS — I4819 Other persistent atrial fibrillation: Secondary | ICD-10-CM | POA: Diagnosis not present

## 2022-07-22 DIAGNOSIS — Z950 Presence of cardiac pacemaker: Secondary | ICD-10-CM | POA: Diagnosis not present

## 2022-07-22 DIAGNOSIS — D500 Iron deficiency anemia secondary to blood loss (chronic): Secondary | ICD-10-CM | POA: Diagnosis not present

## 2022-07-22 MED ORDER — TORSEMIDE 60 MG PO TABS: 60.0000 mg | ORAL_TABLET | Freq: Every day | ORAL | 3 refills | Status: DC

## 2022-07-22 NOTE — Telephone Encounter (Signed)
Spoke with pharmacist and she states toresmide 60mg  tablet PO daily is only coming up has name and insurance will not cover. She is asking if we can change the order to toresmide 20mg  take 3 tablets daily for a total of 60mg  daily so patient insurance can cover prescription.

## 2022-07-22 NOTE — Telephone Encounter (Signed)
Pt c/o medication issue:  1. Name of Medication:   Torsemide 60 MG TABS    2. How are you currently taking this medication (dosage and times per day)?   Take 60 mg by mouth daily.    3. Are you having a reaction (difficulty breathing--STAT)? No  4. What is your medication issue? Pharmacy calling requesting that dosage and instructions be changed to 20 MG, 3 tablets. Due to pt's insurance. Please advise

## 2022-07-22 NOTE — Patient Instructions (Addendum)
Medication Instructions:  Stop Lasix (Furosemide) as directed Start Torsemide 60 mg daily as directed  *If you need a refill on your cardiac medications before your next appointment, please call your pharmacy*   Lab Work: BNP, CMET, CBC today.  If you have labs (blood work) drawn today and your tests are completely normal, you will receive your results only by: MyChart Message (if you have MyChart) OR A paper copy in the mail If you have any lab test that is abnormal or we need to change your treatment, we will call you to review the results.   Testing/Procedures: NONE ordered at this time of appointment     Follow-Up: At [facility], you and your health needs are our priority.  As part of our continuing mission to provide you with exceptional heart care, we have created designated Provider Care Teams.  These Care Teams include your primary Cardiologist (physician) and Advanced Practice Providers (APPs -  Physician Assistants and Nurse Practitioners) who all work together to provide you with the care you need, when you need it.  We recommend signing up for the patient portal called "MyChart".  Sign up information is provided on this After Visit Summary.  MyChart is used to connect with patients for Virtual Visits (Telemedicine).  Patients are able to view lab/test results, encounter notes, upcoming appointments, etc.  Non-urgent messages can be sent to your provider as well.   To learn more about what you can do with MyChart, go to [email or web address].    Your next appointment:   2 weeks Bernadene Person NP) Next Available (Dr. Royann Shivers)  Provider:   Thurmon Fair, MD  or Bernadene Person, NP        Other Instructions Call our office with worsening swelling, weight gain of 3 lb overnight or 5 lb in 1 week.

## 2022-07-22 NOTE — Telephone Encounter (Signed)
Spoke with [facility] and gave verbal RX for Torsemide generic 20 mg, take 3 tablets daily to equal 60 mg.

## 2022-07-23 LAB — COMPREHENSIVE METABOLIC PANEL

## 2022-07-23 LAB — CBC: Hematocrit: 33.4 % — ABNORMAL LOW (ref 37.5–51.0)

## 2022-07-24 ENCOUNTER — Encounter: Payer: Self-pay | Admitting: Nurse Practitioner

## 2022-07-25 DIAGNOSIS — E1121 Type 2 diabetes mellitus with diabetic nephropathy: Secondary | ICD-10-CM | POA: Diagnosis not present

## 2022-07-25 DIAGNOSIS — I872 Venous insufficiency (chronic) (peripheral): Secondary | ICD-10-CM | POA: Diagnosis not present

## 2022-07-25 DIAGNOSIS — L97211 Non-pressure chronic ulcer of right calf limited to breakdown of skin: Secondary | ICD-10-CM | POA: Diagnosis not present

## 2022-07-25 DIAGNOSIS — E538 Deficiency of other specified B group vitamins: Secondary | ICD-10-CM | POA: Diagnosis not present

## 2022-07-29 ENCOUNTER — Telehealth: Payer: Self-pay | Admitting: Hematology

## 2022-07-29 NOTE — Telephone Encounter (Signed)
scheduled per referral , pt daughter has been called and confirmed date and time. Pt is aware of location and to arrive early for check in   

## 2022-08-03 ENCOUNTER — Telehealth: Payer: Self-pay

## 2022-08-03 NOTE — Telephone Encounter (Signed)
Spoke with pt. Pt was notified of lab results. Pt will continue his current medication and repeat labs at his next appointment 08/05/22 ok per Bernadene Person NP.

## 2022-08-04 ENCOUNTER — Telehealth: Payer: Self-pay | Admitting: Cardiovascular Disease

## 2022-08-04 DIAGNOSIS — I872 Venous insufficiency (chronic) (peripheral): Secondary | ICD-10-CM | POA: Diagnosis not present

## 2022-08-04 DIAGNOSIS — E11622 Type 2 diabetes mellitus with other skin ulcer: Secondary | ICD-10-CM | POA: Diagnosis not present

## 2022-08-04 DIAGNOSIS — L97211 Non-pressure chronic ulcer of right calf limited to breakdown of skin: Secondary | ICD-10-CM | POA: Diagnosis not present

## 2022-08-04 DIAGNOSIS — I509 Heart failure, unspecified: Secondary | ICD-10-CM | POA: Diagnosis not present

## 2022-08-04 NOTE — Telephone Encounter (Signed)
Attempted to return call to patients daughter- unable to reach. Left message to call back.

## 2022-08-04 NOTE — Telephone Encounter (Signed)
Daughter called to get orders for patient to have test on his leg.

## 2022-08-05 ENCOUNTER — Ambulatory Visit: Payer: Medicare Other | Attending: Nurse Practitioner | Admitting: Nurse Practitioner

## 2022-08-05 ENCOUNTER — Encounter: Payer: Self-pay | Admitting: Nurse Practitioner

## 2022-08-05 VITALS — BP 102/50 | HR 67 | Ht 71.0 in | Wt 195.0 lb

## 2022-08-05 DIAGNOSIS — E118 Type 2 diabetes mellitus with unspecified complications: Secondary | ICD-10-CM | POA: Diagnosis not present

## 2022-08-05 DIAGNOSIS — E785 Hyperlipidemia, unspecified: Secondary | ICD-10-CM

## 2022-08-05 DIAGNOSIS — D500 Iron deficiency anemia secondary to blood loss (chronic): Secondary | ICD-10-CM | POA: Diagnosis not present

## 2022-08-05 DIAGNOSIS — I10 Essential (primary) hypertension: Secondary | ICD-10-CM | POA: Diagnosis not present

## 2022-08-05 DIAGNOSIS — I4819 Other persistent atrial fibrillation: Secondary | ICD-10-CM | POA: Diagnosis not present

## 2022-08-05 DIAGNOSIS — M79604 Pain in right leg: Secondary | ICD-10-CM

## 2022-08-05 DIAGNOSIS — I2510 Atherosclerotic heart disease of native coronary artery without angina pectoris: Secondary | ICD-10-CM | POA: Diagnosis not present

## 2022-08-05 DIAGNOSIS — Z950 Presence of cardiac pacemaker: Secondary | ICD-10-CM | POA: Diagnosis not present

## 2022-08-05 DIAGNOSIS — I442 Atrioventricular block, complete: Secondary | ICD-10-CM

## 2022-08-05 DIAGNOSIS — I5042 Chronic combined systolic (congestive) and diastolic (congestive) heart failure: Secondary | ICD-10-CM

## 2022-08-05 DIAGNOSIS — M79605 Pain in left leg: Secondary | ICD-10-CM

## 2022-08-05 DIAGNOSIS — N183 Chronic kidney disease, stage 3 unspecified: Secondary | ICD-10-CM | POA: Diagnosis not present

## 2022-08-05 DIAGNOSIS — I7121 Aneurysm of the ascending aorta, without rupture: Secondary | ICD-10-CM | POA: Diagnosis not present

## 2022-08-05 DIAGNOSIS — Z7984 Long term (current) use of oral hypoglycemic drugs: Secondary | ICD-10-CM

## 2022-08-05 DIAGNOSIS — Z794 Long term (current) use of insulin: Secondary | ICD-10-CM

## 2022-08-05 NOTE — Telephone Encounter (Signed)
Patient is at office at present and doctor being made aware of need for test by other nurse
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
## 2022-08-05 NOTE — Patient Instructions (Signed)
Medication Instructions:  Your physician recommends that you continue on your current medications as directed. Please refer to the Current Medication list given to you today.  *If you need a refill on your cardiac medications before your next appointment, please call your pharmacy*   Lab Work: BMET, CMET today   Testing/Procedures: Your physician has requested that you have an ankle brachial index (ABI). During this test an ultrasound and blood pressure cuff are used to evaluate the arteries that supply the arms and legs with blood. Allow thirty minutes for this exam. There are no restrictions or special instructions.    Follow-Up: At [facility], you and your health needs are our priority.  As part of our continuing mission to provide you with exceptional heart care, we have created designated Provider Care Teams.  These Care Teams include your primary Cardiologist (physician) and Advanced Practice Providers (APPs -  Physician Assistants and Nurse Practitioners) who all work together to provide you with the care you need, when you need it.  We recommend signing up for the patient portal called "MyChart".  Sign up information is provided on this After Visit Summary.  MyChart is used to connect with patients for Virtual Visits (Telemedicine).  Patients are able to view lab/test results, encounter notes, upcoming appointments, etc.  Non-urgent messages can be sent to your provider as well.   To learn more about what you can do with MyChart, go to [email or web address].    Your next appointment:    Keep follow up   Provider:   Thurmon Fair, MD     Other Instructions

## 2022-08-06 LAB — COMPREHENSIVE METABOLIC PANEL
ALT: 11 IU/L (ref 0–44)
AST: 15 IU/L (ref 0–40)
Albumin/Globulin Ratio: 1.5 (ref 1.2–2.2)
Albumin: 4.3 g/dL (ref 3.7–4.7)
Alkaline Phosphatase: 66 IU/L (ref 44–121)
BUN/Creatinine Ratio: 14 (ref 10–24)
BUN: 24 mg/dL (ref 8–27)
Bilirubin Total: 0.7 mg/dL (ref 0.0–1.2)
CO2: 24 mmol/L (ref 20–29)
Calcium: 9.1 mg/dL (ref 8.6–10.2)
Chloride: 99 mmol/L (ref 96–106)
Creatinine, Ser: 1.77 mg/dL — ABNORMAL HIGH (ref 0.76–1.27)
Globulin, Total: 2.8 g/dL (ref 1.5–4.5)
Glucose: 124 mg/dL — ABNORMAL HIGH (ref 70–99)
Potassium: 4.4 mmol/L (ref 3.5–5.2)
Sodium: 139 mmol/L (ref 134–144)
Total Protein: 7.1 g/dL (ref 6.0–8.5)
eGFR: 37 mL/min/{1.73_m2} — ABNORMAL LOW (ref >59)

## 2022-08-06 LAB — BRAIN NATRIURETIC PEPTIDE: BNP: 170.9 pg/mL — ABNORMAL HIGH (ref 0.0–100.0)

## 2022-08-08 ENCOUNTER — Telehealth: Payer: Self-pay

## 2022-08-08 NOTE — Telephone Encounter (Signed)
Lmom to discuss lab results. Waiting on a return call.  

## 2022-08-09 LAB — CBC
Hemoglobin: 10.4 g/dL — ABNORMAL LOW (ref 13.0–17.7)
MCH: 29.9 pg (ref 26.6–33.0)
MCHC: 31.1 g/dL — ABNORMAL LOW (ref 31.5–35.7)
MCV: 96 fL (ref 79–97)
Platelets: 199 10*3/uL (ref 150–450)
RBC: 3.48 x10E6/uL — ABNORMAL LOW (ref 4.14–5.80)
RDW: 14.7 % (ref 11.6–15.4)
WBC: 6 10*3/uL (ref 3.4–10.8)

## 2022-08-09 LAB — COMPREHENSIVE METABOLIC PANEL

## 2022-08-09 LAB — BRAIN NATRIURETIC PEPTIDE: BNP: 240.3 pg/mL — ABNORMAL HIGH (ref 0.0–100.0)

## 2022-08-09 NOTE — Progress Notes (Signed)
Remote pacemaker transmission.   

## 2022-08-11 ENCOUNTER — Encounter (HOSPITAL_BASED_OUTPATIENT_CLINIC_OR_DEPARTMENT_OTHER): Payer: Medicare Other | Attending: Internal Medicine | Admitting: Internal Medicine

## 2022-08-11 DIAGNOSIS — Z951 Presence of aortocoronary bypass graft: Secondary | ICD-10-CM | POA: Insufficient documentation

## 2022-08-11 DIAGNOSIS — E11622 Type 2 diabetes mellitus with other skin ulcer: Secondary | ICD-10-CM | POA: Diagnosis not present

## 2022-08-11 DIAGNOSIS — E1151 Type 2 diabetes mellitus with diabetic peripheral angiopathy without gangrene: Secondary | ICD-10-CM | POA: Insufficient documentation

## 2022-08-11 DIAGNOSIS — I872 Venous insufficiency (chronic) (peripheral): Secondary | ICD-10-CM | POA: Insufficient documentation

## 2022-08-11 DIAGNOSIS — L97818 Non-pressure chronic ulcer of other part of right lower leg with other specified severity: Secondary | ICD-10-CM | POA: Diagnosis not present

## 2022-08-11 DIAGNOSIS — Z950 Presence of cardiac pacemaker: Secondary | ICD-10-CM | POA: Diagnosis not present

## 2022-08-11 DIAGNOSIS — E785 Hyperlipidemia, unspecified: Secondary | ICD-10-CM | POA: Insufficient documentation

## 2022-08-11 DIAGNOSIS — J449 Chronic obstructive pulmonary disease, unspecified: Secondary | ICD-10-CM | POA: Diagnosis not present

## 2022-08-11 DIAGNOSIS — I5022 Chronic systolic (congestive) heart failure: Secondary | ICD-10-CM | POA: Insufficient documentation

## 2022-08-11 DIAGNOSIS — I130 Hypertensive heart and chronic kidney disease with heart failure and stage 1 through stage 4 chronic kidney disease, or unspecified chronic kidney disease: Secondary | ICD-10-CM | POA: Diagnosis not present

## 2022-08-11 DIAGNOSIS — I480 Paroxysmal atrial fibrillation: Secondary | ICD-10-CM | POA: Diagnosis not present

## 2022-08-11 DIAGNOSIS — N189 Chronic kidney disease, unspecified: Secondary | ICD-10-CM | POA: Insufficient documentation

## 2022-08-11 DIAGNOSIS — I890 Lymphedema, not elsewhere classified: Secondary | ICD-10-CM | POA: Insufficient documentation

## 2022-08-11 DIAGNOSIS — L97812 Non-pressure chronic ulcer of other part of right lower leg with fat layer exposed: Secondary | ICD-10-CM | POA: Diagnosis not present

## 2022-08-11 DIAGNOSIS — I2510 Atherosclerotic heart disease of native coronary artery without angina pectoris: Secondary | ICD-10-CM | POA: Diagnosis not present

## 2022-08-11 DIAGNOSIS — E1122 Type 2 diabetes mellitus with diabetic chronic kidney disease: Secondary | ICD-10-CM | POA: Diagnosis not present

## 2022-08-12 NOTE — Progress Notes (Signed)
JENCARLOS, NICOLSON ([id number]) [id number] Page 1 of 4 Visit Report for 08/11/2022 Abuse Risk Screen Details Patient Name: Date of Service: Jesus, Oconnor [facility] 08/11/2022 7:50 A M Medical Record Number: [id number] Patient Account Number: [id number] Date of Birth/Sex: Treating RN: April 22, 1933 (89 y.o. Tammy Sours Primary Care Rosalin Buster: [place] Blas, Abel Presto NCIS Other Clinician: Referring Shaletta Hinostroza: Treating Merci Walthers/Extender: Wilder Glade in Treatment: 0 Abuse Risk Screen Items Answer ABUSE RISK SCREEN: Has anyone close to you tried to hurt or harm you recentlyo No Do you feel uncomfortable with anyone in your familyo No Has anyone forced you do things that you didnt want to doo No Electronic Signature(s) Signed: 08/11/2022 6:21:23 PM By: Shawn Stall RN, BSN Entered By: Shawn Stall on 08/11/2022 08:17:35 -------------------------------------------------------------------------------- Activities of Daily Living Details Patient Name: Date of Service: Jesus, Oconnor [facility] C. 08/11/2022 7:50 A M Medical Record Number: [id number] Patient Account Number: [id number] Date of Birth/Sex: Treating RN: [birth year] (89 y.o. Tammy Sours Primary Care Abbigail Anstey: [place] Blas, [place] NCIS Other Clinician: Referring Ruari Mudgett: Treating Anagha Loseke/Extender: Wilder Glade in Treatment: 0 Activities of Daily Living Items Answer Activities of Daily Living (Please select one for each item) Drive Automobile Not Able T Medications ake Completely Able Use T elephone Completely Able Care for Appearance Completely Able Use T oilet Completely Able Bath / Shower Completely Able Dress Self Completely Able Feed Self Completely Able Walk Need Assistance Get In / Out Bed Completely Able Housework Need Assistance Prepare Meals Need Assistance Handle Money Completely Able Shop for Self Need Assistance Electronic Signature(s) Signed: 08/11/2022  6:21:23 PM By: Shawn Stall RN, BSN Entered By: Shawn Stall on 08/11/2022 08:17:56 -------------------------------------------------------------------------------- Education Screening Details Patient Name: Date of Service: Jesus Oconnor, Jesus RGE C. 08/11/2022 7:50 A M Medical Record Number: [id number] Patient Account Number: [id number] Date of Birth/Sex: Treating RN: 1933/08/29 (89 y.o. Tammy Sours Primary Care Hawke Villalpando: [place] Blas, [place] NCIS Other Clinician: Referring Charman Blasco: Treating Mollee Neer/Extender: Wilder Glade in Treatment: [address] Jesus, Oconnor ([id number]) [id number] Page 2 of 4 Primary Learner Assessed: Patient Learning Preferences/Education Level/Primary Language Learning Preference: Explanation, Demonstration, Printed Material Highest Education Level: Grade School Preferred Language: Economist Language Barrier: No Translator Needed: No Memory Deficit: No Emotional Barrier: No Cultural/Religious Beliefs Affecting Medical Care: No Physical Barrier Impaired Vision: No Impaired Hearing: No Decreased Hand dexterity: No Knowledge/Comprehension Knowledge Level: High Comprehension Level: High Ability to understand written instructions: High Ability to understand verbal instructions: High Motivation Anxiety Level: Calm Cooperation: Cooperative Education Importance: Acknowledges Need Interest in Health Problems: Asks Questions Perception: Coherent Willingness to Engage in Self-Management High Activities: Readiness to Engage in Self-Management High Activities: Electronic Signature(s) Signed: 08/11/2022 6:21:23 PM By: Shawn Stall RN, BSN Entered By: Shawn Stall on 08/11/2022 08:18:29 -------------------------------------------------------------------------------- Fall Risk Assessment Details Patient Name: Date of Service: Jesus Oconnor, Jesus RGE C. 08/11/2022 7:50 A M Medical Record Number:  [id number] Patient Account Number: [id number] Date of Birth/Sex: Treating RN: October 09, 1933 (89 y.o. Tammy Sours Primary Care Chynna Buerkle:  Blas, F[place] NCIS Other Clinician: Referring Treazure Nery: Treating Jamesen Stahnke/Extender: Wilder Glade in Treatment: 0 Fall Risk Assessment Items Have you had 2 or more falls in the last 12 monthso 0 Yes Have you had any fall that resulted in injury in the last 12 monthso 0 No FALLS RISK SCREEN History of falling - immediate or within 3 months 25 Yes Secondary diagnosis (Do you have 2 or more medical diagnoseso) 0 No Ambulatory  aid None/bed rest/wheelchair/nurse 0 No Crutches/cane/walker 15 Yes Furniture 0 No Intravenous therapy Access/Saline/Heparin Lock 0 No Gait/Transferring Normal/ bed rest/ wheelchair 0 No Weak (short steps with or without shuffle, stooped but able to lift head while walking, may seek 10 Yes support from furniture) Impaired (short steps with shuffle, may have difficulty arising from chair, head down, impaired 0 No balance) Mental Status Oriented to own ability 0 Yes Overestimates or forgets limitations 0 No Risk Level: Medium Risk Score: 50 [name], [name] ([id number]) [phone] [id number] Page 3 of 4 Electronic Signature(s) -------------------------------------------------------------------------------- Foot Assessment Details Patient Name: Date of Service: Jesus, Oconnor 08/11/2022 7:50 A M Medical Record Number: [id number] Patient Account Number: [id number] Date of Birth/Sex: Treating RN: 1933/09/19 (89 y.o. Tammy Sours Primary Care Marsi Turvey: [place] Blas, FRA NCIS Other Clinician: Referring Ranyia Witting: Treating Aryon Nham/Extender: Wilder Glade in Treatment: 0 Foot Assessment Items Site Locations + = Sensation present, - = Sensation absent, C = Callus, U = Ulcer R = Redness, W = Warmth, M = Maceration, PU = Pre-ulcerative lesion F = Fissure, S  = Swelling, D = Dryness Assessment Right: Left: Other Deformity: No No Prior Foot Ulcer: No No Prior Amputation: No No Charcot Joint: No No Ambulatory Status: Ambulatory With Help Assistance Device: Walker Gait[employer]utures trader) Signed: 08/11/2022 6:21:23 PM By: Shawn Stall RN, BSN Entered By: Shawn Stall on 08/11/2022 08:32:43 -------------------------------------------------------------------------------- Nutrition Risk Screening Details Patient Name: Date of Service: Jesus Oconnor, Jesus RGE C. 08/11/2022 7:50 A M Medical Record Number: [id number] Patient Account Number: [id number] Date of Birth/Sex: Treating RN: Apr 22, 1933 (89 y.o. Tammy Sours Primary Care Mattthew Ziomek: [place], [place] NCIS Other Clinician: Referring Robbie Nangle: Treating Brendalee Matthies/Extender: Wilder Glade in Treatment: 0 Height (in): 71 Weight (lbs): 196 Body Mass Index (BMI): 27.3 Jesus Oconnor, Jesus Oconnor ([id number]) [phone] [id number] Page 4 of 4 Nutrition Risk Screening Items Score Screening NUTRITION RISK SCREEN: I have an illness or condition that made me change the kind and/or amount of food I eat 2 Yes I eat fewer than two meals per day 0 No I eat few fruits and vegetables, or milk products 0 No I have three or more drinks of beer, liquor or wine almost every day 0 No I have tooth or mouth problems that make it hard for me to eat 0 No I don't always have enough money to buy the food I need 0 No I eat alone most of the time 0 No I take three or more different prescribed or over-the-counter drugs a day 1 Yes Without wanting to, I have lost or gained 10 pounds in the last six months 0 No I am not always physically able to shop, cook and/or feed myself 0 No Nutrition Protocols Good Risk Protocol Provide education on elevated blood Moderate Risk Protocol 0 sugars and impact on wound healing, as applicable High Risk Proctocol Risk Level: Moderate  Risk Score: 3 Electronic Signature(s) Signed: 08/11/2022 6:21:23 PM By: Shawn Stall RN, BSN Entered By: Shawn Stall on 08/11/2022 66:06:30

## 2022-08-12 NOTE — Telephone Encounter (Signed)
LMOM to discuss lab results. Waiting on a return call.  

## 2022-08-18 ENCOUNTER — Encounter (HOSPITAL_BASED_OUTPATIENT_CLINIC_OR_DEPARTMENT_OTHER): Payer: Medicare Other | Admitting: Internal Medicine

## 2022-08-18 DIAGNOSIS — I890 Lymphedema, not elsewhere classified: Secondary | ICD-10-CM | POA: Diagnosis not present

## 2022-08-18 DIAGNOSIS — I87331 Chronic venous hypertension (idiopathic) with ulcer and inflammation of right lower extremity: Secondary | ICD-10-CM

## 2022-08-18 DIAGNOSIS — E785 Hyperlipidemia, unspecified: Secondary | ICD-10-CM | POA: Diagnosis not present

## 2022-08-18 DIAGNOSIS — I872 Venous insufficiency (chronic) (peripheral): Secondary | ICD-10-CM | POA: Diagnosis not present

## 2022-08-18 DIAGNOSIS — I5022 Chronic systolic (congestive) heart failure: Secondary | ICD-10-CM | POA: Diagnosis not present

## 2022-08-18 DIAGNOSIS — L97818 Non-pressure chronic ulcer of other part of right lower leg with other specified severity: Secondary | ICD-10-CM

## 2022-08-18 DIAGNOSIS — I130 Hypertensive heart and chronic kidney disease with heart failure and stage 1 through stage 4 chronic kidney disease, or unspecified chronic kidney disease: Secondary | ICD-10-CM | POA: Diagnosis not present

## 2022-08-18 DIAGNOSIS — E11622 Type 2 diabetes mellitus with other skin ulcer: Secondary | ICD-10-CM | POA: Diagnosis not present

## 2022-08-18 DIAGNOSIS — N189 Chronic kidney disease, unspecified: Secondary | ICD-10-CM | POA: Diagnosis not present

## 2022-08-18 DIAGNOSIS — E1151 Type 2 diabetes mellitus with diabetic peripheral angiopathy without gangrene: Secondary | ICD-10-CM | POA: Diagnosis not present

## 2022-08-18 DIAGNOSIS — Z951 Presence of aortocoronary bypass graft: Secondary | ICD-10-CM | POA: Diagnosis not present

## 2022-08-18 DIAGNOSIS — E1122 Type 2 diabetes mellitus with diabetic chronic kidney disease: Secondary | ICD-10-CM | POA: Diagnosis not present

## 2022-08-18 DIAGNOSIS — I2510 Atherosclerotic heart disease of native coronary artery without angina pectoris: Secondary | ICD-10-CM | POA: Diagnosis not present

## 2022-08-18 DIAGNOSIS — J449 Chronic obstructive pulmonary disease, unspecified: Secondary | ICD-10-CM | POA: Diagnosis not present

## 2022-08-18 DIAGNOSIS — Z950 Presence of cardiac pacemaker: Secondary | ICD-10-CM | POA: Diagnosis not present

## 2022-08-18 DIAGNOSIS — I480 Paroxysmal atrial fibrillation: Secondary | ICD-10-CM | POA: Diagnosis not present

## 2022-08-18 NOTE — Telephone Encounter (Signed)
Letter mailed to pt.  

## 2022-08-19 ENCOUNTER — Ambulatory Visit (HOSPITAL_COMMUNITY)
Admission: RE | Admit: 2022-08-19 | Discharge: 2022-08-19 | Disposition: A | Discharge status: Home / Self Care | Payer: Medicare Other | Source: Ambulatory Visit | Attending: Nurse Practitioner | Admitting: Nurse Practitioner

## 2022-08-19 ENCOUNTER — Other Ambulatory Visit (HOSPITAL_COMMUNITY): Payer: Self-pay | Admitting: Nurse Practitioner

## 2022-08-19 DIAGNOSIS — S81801A Unspecified open wound, right lower leg, initial encounter: Secondary | ICD-10-CM | POA: Insufficient documentation

## 2022-08-19 DIAGNOSIS — M79604 Pain in right leg: Secondary | ICD-10-CM | POA: Diagnosis not present

## 2022-08-19 DIAGNOSIS — M79605 Pain in left leg: Secondary | ICD-10-CM | POA: Insufficient documentation

## 2022-08-19 LAB — VAS US ABI WITH/WO TBI
Left ABI: 1.28
Right ABI: 1.2

## 2022-08-20 NOTE — Progress Notes (Signed)
FLABIO, VRADENBURG ([id number]) [id number] Page 1 of 8 Visit Report for 08/18/2022 Arrival Information Details Patient Name: Date of Service: Jesus Oconnor, Jesus Oconnor [facility] C. 08/18/2022 8:00 A M Medical Record Number: [id number] Patient Account Number: [id number] Date of Birth/Sex: Treating RN: 1933-07-02 (89 y.o. Tammy Sours Primary Care Shawntina Diffee: [place] Blas, Abel Presto NCIS Other Clinician: Referring Haruo Stepanek: Treating Quaneshia Wareing/Extender: Lenetta Quaker in Treatment: 1 Visit Information History Since Last Visit Added or deleted any medications: No Patient Arrived: Ambulatory Any new allergies or adverse reactions: No Arrival Time: 08:15 Had a fall or experienced change in No Accompanied By: daughter activities of daily living that may affect Transfer Assistance: None risk of falls: Patient Identification Verified: Yes Signs or symptoms of abuse/neglect since last visito No Secondary Verification Process Completed: Yes Hospitalized since last visit: No Patient Requires Transmission-Based Precautions: No Implantable device outside of the clinic excluding No Patient Has Alerts: Yes cellular tissue based products placed in the center Patient Alerts: Patient on Blood Thinner since last visit: Eliquis Has Dressing in Place as Prescribed: Yes Has Compression in Place as Prescribed: Yes Pain Present Now: Yes Electronic Signature(s) Signed: 08/18/2022 5:31:32 PM By: Shawn Stall RN, BSN Entered By: Shawn Stall on 08/18/2022 08:15:51 -------------------------------------------------------------------------------- Compression Therapy Details Patient Name: Date of Service: Jesus Oconnor, Jesus RGE C. 08/18/2022 8:00 A M Medical Record Number: [id number] Patient Account Number: [id number] Date of Birth/Sex: Treating RN: [birth year] (89 y.o. Tammy Sours Primary Care Shamonique Battiste: [place] Blas, [place] NCIS Other Clinician: Referring Tashanda Fuhrer: Treating Elize Pinon/Extender:  Lenetta Quaker in Treatment: 1 Compression Therapy Performed for Wound Assessment: Wound #1 Right,Circumferential Lower Leg Performed By: Clinician Shawn Stall, RN Compression Type: Double Layer Post Procedure Diagnosis Same as Pre-procedure Electronic Signature(s) Signed: 08/18/2022 5:31:32 PM By: Shawn Stall RN, BSN Entered By: Shawn Stall on 08/18/2022 08:41:24 Saintclair Halsted ([id number]) [id number] Page 2 of 8 -------------------------------------------------------------------------------- Encounter Discharge Information Details Patient Name: Date of Service: Jesus Oconnor, Jesus [facility] C. 08/18/2022 8:00 A M Medical Record Number: [id number] Patient Account Number: [id number] Date of Birth/Sex: Treating RN: 09/06/1933 (89 y.o. Tammy Sours Primary Care Ayesha Markwell: [place] Blas, Abel Presto NCIS Other Clinician: Referring Tattianna Schnarr: Treating Hillary Schwegler/Extender: Lenetta Quaker in Treatment: 1 Encounter Discharge Information Items Discharge Condition: Stable Ambulatory Status: Ambulatory Discharge Destination: Home Transportation: Private Auto Accompanied By: self Schedule Follow-up Appointment: Yes Clinical Summary of Care: Electronic Signature(s) Signed: 08/18/2022 5:31:32 PM By: Shawn Stall RN, BSN Entered By: Shawn Stall on 08/18/2022 08:46:36 -------------------------------------------------------------------------------- Lower Extremity Assessment Details Patient Name: Date of Service: Jesus Oconnor, Jesus RGE C. 08/18/2022 8:00 A M Medical Record Number: [id number] Patient Account Number: [id number] Date of Birth/Sex: Treating RN: March 18, 1933 (89 y.o. Tammy Sours Primary Care Laekyn Rayos: [place] Blas, FRA NCIS Other Clinician: Referring Mykell Rawl: Treating Helaine Yackel/Extender: Venida Jarvis Weeks in Treatment: 1 Edema Assessment Assessed: [Left: Yes] [Right: Yes] Edema: [Left: Yes] [Right:  Yes] Calf Left: Right: Point of Measurement: 35 cm From Medial Instep 39.5 cm 38 cm Ankle Left: Right: Point of Measurement: 10 cm From Medial Instep 25 cm 24 cm Knee To Floor Left: Right: From Medial Instep 47 cm 47 cm Vascular Assessment Pulses: Dorsalis Pedis Palpable: [Right:Yes] Electronic Signature(s) Signed: 08/18/2022 5:31:32 PM By: Shawn Stall RN, BSN Entered By: Shawn Stall on 08/18/2022 08:19:15 Saintclair Halsted ([id number]) [id number] Page 3 of 8 -------------------------------------------------------------------------------- Multi Wound Chart Details Patient Name: Date of Service: Jesus Oconnor, Jesus Oconnor [facility] C. 08/18/2022 8:00 A M Medical Record Number: [id number] Patient Account Number: [id number]  Date of Birth/Sex: Treating RN: [birth year] (89 y.o. M) Primary Care Lucendia Leard: [place], FRA NCIS Other Clinician: Referring Arshad Oberholzer: Treating Loy Mccartt/Extender: Lenetta Quaker in Treatment: 1 Vital Signs Height(in): 71 Capillary Blood Glucose(mg/dl): 433 Weight(lbs): 295 Pulse(bpm): 61 Body Mass Index(BMI): 27.3 Blood Pressure(mmHg): 124/68 Temperature(F): 98.5 Respiratory Rate(breaths/min): 20 [1:Photos:] [N/A:N/A] Right, Circumferential Lower Leg N/A N/A Wound Location: Trauma N/A N/A Wounding Event: Diabetic Wound/Ulcer of the Lower N/A N/A Primary Etiology: Extremity Venous Leg Ulcer N/A N/A Secondary Etiology: Anemia, Chronic Obstructive N/A N/A Comorbid History: Pulmonary Disease (COPD), Arrhythmia, Congestive Heart Failure, Coronary Artery Disease, Hypertension, Type II Diabetes 07/11/2022 N/A N/A Date Acquired: 1 N/A N/A Weeks of Treatment: Open N/A N/A Wound Status: No N/A N/A Wound Recurrence: Yes N/A N/A Clustered Wound: 0 N/A N/A Clustered Quantity: 0.2x0.2x0.1 N/A N/A Measurements L x W x D (cm) 0.031 N/A N/A A (cm) : rea 0.003 N/A N/A Volume (cm) : 100.00% N/A N/A % Reduction in A  rea: 100.00% N/A N/A % Reduction in Volume: Grade 1 N/A N/A Classification: Medium N/A N/A Exudate A mount: Serosanguineous N/A N/A Exudate Type: red, brown N/A N/A Exudate Color: Distinct, outline attached N/A N/A Wound Margin: Large (67-100%) N/A N/A Granulation A mount: Red, Pink N/A N/A Granulation Quality: Small (1-33%) N/A N/A Necrotic A mount: Fat Layer (Subcutaneous Tissue): Yes N/A N/A Exposed Structures: Fascia: No Tendon: No Muscle: No Joint: No Bone: No Large (67-100%) N/A N/A Epithelialization: Excoriation: No N/A N/A Periwound Skin Texture: Induration: No Callus: No Crepitus: No Rash: No Scarring: No Dry/Scaly: Yes N/A N/A Periwound Skin Moisture: Maceration: No Hemosiderin Staining: Yes N/A N/A Periwound Skin Color: Atrophie Blanche: No Cyanosis: No Ecchymosis: No Jesus Oconnor, Jesus Oconnor ([id number]) [id number] Page 4 of 8 Erythema: No Mottled: No Pallor: No Rubor: No Compression Therapy N/A N/A Procedures Performed: Treatment Notes Wound #1 (Lower Leg) Wound Laterality: Right, Circumferential Cleanser Soap and Water Discharge Instruction: May shower and wash wound with dial antibacterial soap and water prior to dressing change. Vashe 5.8 (oz) Discharge Instruction: Cleanse the wound with Vashe prior to applying a clean dressing using gauze sponges, not tissue or cotton balls. Peri-Wound Care Triamcinolone 15 (g) Discharge Instruction: Use triamcinolone 15 (g) as directed Sween Lotion (Moisturizing lotion) Discharge Instruction: Apply moisturizing lotion as directed Topical Primary Dressing Maxorb Extra Ag+ Alginate Dressing, 4x4.75 (in/in) Discharge Instruction: Apply to wound bed as instructed Secondary Dressing ABD Pad, 8x10 Discharge Instruction: Apply over primary dressing as directed. Secured With Compression Wrap Urgo K2 Lite, (equivalent to a 3 layer) two layer compression system, regular Discharge  Instruction: Apply Urgo K2 Lite as directed (alternative to 3 layer compression). Compression Stockings Add-Ons Electronic Signature(s) Signed: 08/18/2022 12:16:14 PM By: Geralyn Corwin DO Entered By: Geralyn Corwin on 08/18/2022 09:13:24 -------------------------------------------------------------------------------- Multi-Disciplinary Care Plan Details Patient Name: Date of Service: Jesus Oconnor, Jesus RGE C. 08/18/2022 8:00 A M Medical Record Number: [id number] Patient Account Number: [id number] Date of Birth/Sex: Treating RN: [birth year] (89 y.o. Tammy Sours Primary Care Rose Hippler: [place] Blas, Abel Presto NCIS Other Clinician: Referring Joanell Cressler: Treating Khamani Fairley/Extender: Lenetta Quaker in Treatment: 1 Active Inactive Venous Leg Ulcer Nursing Diagnoses: Potential for venous Insuffiency (use before diagnosis confirmed) Goals: Patient will maintain optimal edema control Jesus Oconnor, Jesus Oconnor ([id number]) [phone].pdf Page 5 of 8 Date Initiated: 08/11/2022 Target Resolution Date: 09/09/2022 Goal Status: Active Interventions: Assess peripheral edema status every visit. Compression as ordered Treatment Activities: Therapeutic compression applied : 08/11/2022 Notes: Wound/Skin Impairment Nursing Diagnoses: Knowledge deficit related to  ulceration/compromised skin integrity Goals: Patient/caregiver will verbalize understanding of skin care regimen Date Initiated: 08/11/2022 Target Resolution Date: 09/09/2022 Goal Status: Active Interventions: Assess patient/caregiver ability to perform ulcer/skin care regimen upon admission and as needed Assess ulceration(s) every visit Provide education on ulcer and skin care Treatment Activities: Skin care regimen initiated : 08/11/2022 Topical wound management initiated : 08/11/2022 Notes: Electronic Signature(s) Signed: 08/18/2022 5:31:32 PM By: Shawn Stall RN, BSN Entered By: Shawn Stall on 08/18/2022  08:23:15 -------------------------------------------------------------------------------- Pain Assessment Details Patient Name: Date of Service: Jesus Oconnor, Jesus RGE C. 08/18/2022 8:00 A M Medical Record Number: [id number] Patient Account Number: [id number] Date of Birth/Sex: Treating RN: Dec 25, 1933 (89 y.o. Tammy Sours Primary Care Jude Linck: [place] Blas, [place] NCIS Other Clinician: Referring Samhitha Rosen: Treating Mariabelen Pressly/Extender: Lenetta Quaker in Treatment: 1 Active Problems Location of Pain Severity and Description of Pain Patient Has Paino Yes Site Locations Jesus Oconnor, Jesus Oconnor ([id number]) [id number] Page 6 of 8 Pain Management and Medication Current Pain Management: Medication: No Cold Application: No Rest: No Massage: No Activity: No T.E.N.S.: No Heat Application: No Leg drop or elevation: No Is the Current Pain Management Adequate: Adequate How does your wound impact your activities of daily livingo Sleep: No Bathing: No Appetite: No Relationship With Others: No Bladder Continence: No Emotions: No Bowel Continence: No Work: No Toileting: No Drive: No Dressing: No Hobbies: No Notes pain at night per patient in the right toes. Electronic Signature(s) Signed: 08/18/2022 5:31:32 PM By: Shawn Stall RN, BSN Entered By: Shawn Stall on 08/18/2022 08:17:24 -------------------------------------------------------------------------------- Patient/Caregiver Education Details Patient Name: Date of Service: Jesus Oconnor, Jesus RGE C. 6/13/2024andnbsp8:00 A M Medical Record Number: [id number] Patient Account Number: [id number] Date of Birth/Gender: Treating RN: [birth year] (89 y.o. Tammy Sours Primary Care Physician: [place] Blas, [place] NCIS Other Clinician: Referring Physician: Treating Physician/Extender: Lenetta Quaker in Treatment: 1 Education Assessment Education Provided To: Patient Education Topics  Provided Wound/Skin Impairment: Handouts: Caring for Your Ulcer Methods: Explain/Verbal Responses: Reinforcements needed Electronic Signature(s) Signed: 08/18/2022 5:31:32 PM By: Shawn Stall RN, BSN Entered By: Shawn Stall on 08/18/2022 08:23:27 -------------------------------------------------------------------------------- Wound Assessment Details Patient Name: Date of Service: Jesus Oconnor, Jesus RGE C. 08/18/2022 8:00 A M Medical Record Number: [id number] Patient Account Number: [id number] Date of Birth/Sex: Treating RN: 1934/02/22 (89 y.o. Tammy Sours Primary Care Flornce Record: [place] Blas, [place] NCIS Other Clinician: Referring Chrisann Melaragno: Treating Jonette Wassel/Extender: Lenetta Quaker in Treatment: 1 Jesus Oconnor, Jesus Oconnor ([id number]) [id number] Page 7 of 8 Wound Status Wound Number: 1 Primary Diabetic Wound/Ulcer of the Lower Extremity Etiology: Wound Location: Right, Circumferential Lower Leg Secondary Venous Leg Ulcer Wounding Event: Trauma Etiology: Date Acquired: 07/11/2022 Wound Open Weeks Of Treatment: 1 Status: Clustered Wound: Yes Comorbid Anemia, Chronic Obstructive Pulmonary Disease (COPD), History: Arrhythmia, Congestive Heart Failure, Coronary Artery Disease, Hypertension, Type II Diabetes Photos Wound Measurements Length: (cm) Width: (cm) Depth: (cm) Clustered Quantity: Area: (cm) Volume: (cm) 0.2 % Reduction in Area: 100% 0.2 % Reduction in Volume: 100% 0.1 Epithelialization: Large (67-100%) 0 Tunneling: No 0.031 Undermining: No 0.003 Wound Description Classification: Grade 1 Wound Margin: Distinct, outline attached Exudate Amount: Medium Exudate Type: Serosanguineous Exudate Color: red, brown Foul Odor After Cleansing: No Slough/Fibrino Yes Wound Bed Granulation Amount: Large (67-100%) Exposed Structure Granulation Quality: Red, Pink Fascia Exposed: No Necrotic Amount: Small (1-33%) Fat Layer  (Subcutaneous Tissue) Exposed: Yes Necrotic Quality: Adherent Slough Tendon Exposed: No Muscle Exposed: No Joint Exposed: No Bone Exposed: No Periwound Skin Texture Texture Color No Abnormalities Noted:  No No Abnormalities Noted: No Callus: No Atrophie Blanche: No Crepitus: No Cyanosis: No Excoriation: No Ecchymosis: No Induration: No Erythema: No Rash: No Hemosiderin Staining: Yes Scarring: No Mottled: No Pallor: No Moisture Rubor: No No Abnormalities Noted: No Dry / Scaly: Yes Maceration: No Treatment Notes Wound #1 (Lower Leg) Wound Laterality: Right, Circumferential Cleanser Soap and Water Discharge Instruction: May shower and wash wound with dial antibacterial soap and water prior to dressing change. Vashe 5.8 (oz) Discharge Instruction: Cleanse the wound with Vashe prior to applying a clean dressing using gauze sponges, not tissue or cotton balls. Jesus Oconnor, Jesus Oconnor ([id number]) [id number] Page 8 of 8 Peri-Wound Care Triamcinolone 15 (g) Discharge Instruction: Use triamcinolone 15 (g) as directed Sween Lotion (Moisturizing lotion) Discharge Instruction: Apply moisturizing lotion as directed Topical Primary Dressing Maxorb Extra Ag+ Alginate Dressing, 4x4.75 (in/in) Discharge Instruction: Apply to wound bed as instructed Secondary Dressing ABD Pad, 8x10 Discharge Instruction: Apply over primary dressing as directed. Secured With Compression Wrap Urgo K2 Lite, (equivalent to a 3 layer) two layer compression system, regular Discharge Instruction: Apply Urgo K2 Lite as directed (alternative to 3 layer compression). Compression Stockings Add-Ons Electronic Signature(s) Signed: 08/18/2022 5:31:32 PM By: Shawn Stall RN, BSN Entered By: Shawn Stall on 08/18/2022 08:20:31 -------------------------------------------------------------------------------- Vitals Details Patient Name: Date of Service: Jesus Oconnor, Jesus RGE C. 08/18/2022 8:00  A M Medical Record Number: [id number] Patient Account Number: [id number] Date of Birth/Sex: Treating RN: [birth year] (89 y.o. Tammy Sours Primary Care Eudell Julian: [place] NG, FRA [place]IS Other Clinician: Referring Mattalynn Crandle: Treating Nelle Sayed/Extender: Lenetta Quaker in Treatment: 1 Vital Signs Time Taken: 08:20 Temperature (F): 98.5 Height (in): 71 Pulse (bpm): 61 Weight (lbs): 196 Respiratory Rate (breaths/min): 20 Body Mass Index (BMI): 27.3 Blood Pressure (mmHg): 124/68 Capillary Blood Glucose (mg/dl): 696 Reference Range: 80 - 120 mg / dl Electronic Signature(s) Signed: 08/18/2022 5:31:32 PM By: Shawn Stall RN, BSN Entered By: Shawn Stall on 08/18/2022 08:22:37
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
## 2022-08-22 ENCOUNTER — Encounter (HOSPITAL_BASED_OUTPATIENT_CLINIC_OR_DEPARTMENT_OTHER): Payer: Medicare Other | Admitting: Internal Medicine

## 2022-08-22 DIAGNOSIS — E1151 Type 2 diabetes mellitus with diabetic peripheral angiopathy without gangrene: Secondary | ICD-10-CM | POA: Diagnosis not present

## 2022-08-22 DIAGNOSIS — I87331 Chronic venous hypertension (idiopathic) with ulcer and inflammation of right lower extremity: Secondary | ICD-10-CM

## 2022-08-22 DIAGNOSIS — I2510 Atherosclerotic heart disease of native coronary artery without angina pectoris: Secondary | ICD-10-CM | POA: Diagnosis not present

## 2022-08-22 DIAGNOSIS — I480 Paroxysmal atrial fibrillation: Secondary | ICD-10-CM | POA: Diagnosis not present

## 2022-08-22 DIAGNOSIS — I890 Lymphedema, not elsewhere classified: Secondary | ICD-10-CM | POA: Diagnosis not present

## 2022-08-22 DIAGNOSIS — E11622 Type 2 diabetes mellitus with other skin ulcer: Secondary | ICD-10-CM | POA: Diagnosis not present

## 2022-08-22 DIAGNOSIS — Z951 Presence of aortocoronary bypass graft: Secondary | ICD-10-CM | POA: Diagnosis not present

## 2022-08-22 DIAGNOSIS — J449 Chronic obstructive pulmonary disease, unspecified: Secondary | ICD-10-CM | POA: Diagnosis not present

## 2022-08-22 DIAGNOSIS — I130 Hypertensive heart and chronic kidney disease with heart failure and stage 1 through stage 4 chronic kidney disease, or unspecified chronic kidney disease: Secondary | ICD-10-CM | POA: Diagnosis not present

## 2022-08-22 DIAGNOSIS — I872 Venous insufficiency (chronic) (peripheral): Secondary | ICD-10-CM | POA: Diagnosis not present

## 2022-08-22 DIAGNOSIS — I5022 Chronic systolic (congestive) heart failure: Secondary | ICD-10-CM | POA: Diagnosis not present

## 2022-08-22 DIAGNOSIS — Z950 Presence of cardiac pacemaker: Secondary | ICD-10-CM | POA: Diagnosis not present

## 2022-08-22 DIAGNOSIS — N189 Chronic kidney disease, unspecified: Secondary | ICD-10-CM | POA: Diagnosis not present

## 2022-08-22 DIAGNOSIS — L97818 Non-pressure chronic ulcer of other part of right lower leg with other specified severity: Secondary | ICD-10-CM

## 2022-08-22 DIAGNOSIS — E1122 Type 2 diabetes mellitus with diabetic chronic kidney disease: Secondary | ICD-10-CM | POA: Diagnosis not present

## 2022-08-22 DIAGNOSIS — E785 Hyperlipidemia, unspecified: Secondary | ICD-10-CM | POA: Diagnosis not present

## 2022-08-22 NOTE — Progress Notes (Signed)
Jesus Oconnor ([id number]) [id number] Page 1 of 8 Visit Report for 08/22/2022 Arrival Information Details Patient Name: Date of Service: Jesus Ocon[facility]. 08/22/2022 11:15 A M Medical Record Number: [id number] Patient Account Number: [id number] Date of Birth/Sex: Treating RN: 01/05/1934 (89 y.o. M) Primary Care Orvella Digiulio: WO NG, FRA NCIS Other Clinician: Referring Harlee Eckroth: Treating Antonie Borjon/Extender: Lenetta Quaker in Treatment: 1 Visit Information History Since Last Visit Added or deleted any medications: No Patient Arrived: Walker Any new allergies or adverse reactions: No Arrival Time: 11:12 Had a fall or experienced change in No Accompanied By: family activities of daily living that may affect Transfer Assistance: None risk of falls: Patient Identification Verified: Yes Signs or symptoms of abuse/neglect since last visito No Patient Requires Transmission-Based Precautions: No Hospitalized since last visit: No Patient Has Alerts: Yes Implantable device outside of the clinic excluding No Patient Alerts: Patient on Blood Thinner cellular tissue based products placed in the center Eliquis since last visit: Has Compression in Place as Prescribed: Yes Pain Present Now: No Electronic Signature(s) Signed: 08/22/2022 4:27:49 PM By: Thayer Dallas Entered By: Thayer Dallas on 08/22/2022 11:14:12 -------------------------------------------------------------------------------- Clinic Level of Care Assessment Details Patient Name: Date of Service: [facility]. 08/22/2022 11:15 A M Medical Record Number: [id number] Patient Account Number: [id number] Date of Birth/Sex: Treating RN: 09-30-1933 (89 y.o. Tammy Sours Primary Care Doyce Stonehouse: WO NG, FRA NCIS Other Clinician: Referring Ashea Winiarski: Treating Talmage Teaster/Extender: Lenetta Quaker in Treatment: 1 Clinic Level of Care Assessment Items TOOL  4 Quantity Score X- 1 0 Use when only an EandM is performed on FOLLOW-UP visit ASSESSMENTS - Nursing Assessment / Reassessment X- 1 10 Reassessment of Co-morbidities (includes updates in patient status) X- 1 5 Reassessment of Adherence to Treatment Plan ASSESSMENTS - Wound and Skin A ssessment / Reassessment X - Simple Wound Assessment / Reassessment - one wound 1 5 []  - 0 Complex Wound Assessment / Reassessment - multiple wounds X- 1 10 Dermatologic / Skin Assessment (not related to wound area) ASSESSMENTS - Focused Assessment X- 1 5 Circumferential Edema Measurements - multi extremities []  - 0 Nutritional Assessment / Counseling / Intervention Jesus Oconnor ([id number]) [id number] Page 2 of 8 []  - 0 Lower Extremity Assessment (monofilament, tuning fork, pulses) []  - 0 Peripheral Arterial Disease Assessment (using hand held doppler) ASSESSMENTS - Ostomy and/or Continence Assessment and Care []  - 0 Incontinence Assessment and Management []  - 0 Ostomy Care Assessment and Management (repouching, etc.) PROCESS - Coordination of Care X - Simple Patient / Family Education for ongoing care 1 15 []  - 0 Complex (extensive) Patient / Family Education for ongoing care X- 1 10 Staff obtains Chiropractor, Records, T Results / Process Orders est []  - 0 Staff telephones HHA, Nursing Homes / Clarify orders / etc []  - 0 Routine Transfer to another Facility (non-emergent condition) []  - 0 Routine Hospital Admission (non-emergent condition) []  - 0 New Admissions / Manufacturing engineer / Ordering NPWT Apligraf, etc. , []  - 0 Emergency Hospital Admission (emergent condition) X- 1 10 Simple Discharge Coordination []  - 0 Complex (extensive) Discharge Coordination PROCESS - Special Needs []  - 0 Pediatric / Minor Patient Management []  - 0 Isolation Patient Management []  - 0 Hearing / Language / Visual special needs []  - 0 Assessment of Community  assistance (transportation, D/[place] planning, etc.) []  - 0 Additional assistance / Altered mentation []  - 0 Support Surface(s) Assessment (bed, cushion, seat, etc.) INTERVENTIONS - Wound Cleansing / Measurement X -  Simple Wound Cleansing - one wound 1 5 []  - 0 Complex Wound Cleansing - multiple wounds X- 1 5 Wound Imaging (photographs - any number of wounds) []  - 0 Wound Tracing (instead of photographs) X- 1 5 Simple Wound Measurement - one wound []  - 0 Complex Wound Measurement - multiple wounds INTERVENTIONS - Wound Dressings []  - 0 Small Wound Dressing one or multiple wounds []  - 0 Medium Wound Dressing one or multiple wounds []  - 0 Large Wound Dressing one or multiple wounds []  - 0 Application of Medications - topical []  - 0 Application of Medications - injection INTERVENTIONS - Miscellaneous []  - 0 External ear exam []  - 0 Specimen Collection (cultures, biopsies, blood, body fluids, etc.) []  - 0 Specimen(s) / Culture(s) sent or taken to Lab for analysis []  - 0 Patient Transfer (multiple staff / Nurse, adult / Similar devices) []  - 0 Simple Staple / Suture removal (25 or less) []  - 0 Complex Staple / Suture removal (26 or more) []  - 0 Hypo / Hyperglycemic Management (close monitor of Blood Glucose) Jesus Oconnor ([id number]) [id number] Page 3 of 8 []  - 0 Ankle / Brachial Index (ABI) - do not check if billed separately X- 1 5 Vital Signs Has the patient been seen at the hospital within the last three years: Yes Total Score: 90 Level Of Care: New/Established - Level 3 Electronic Signature(s) Signed: 08/22/2022 5:39:10 PM By: Shawn Stall RN, BSN Entered By: Shawn Stall on 08/22/2022 11:31:48 -------------------------------------------------------------------------------- Encounter Discharge Information Details Patient Name: Date of Service: Jesus Oconnor, Jesus Oconnor. 08/22/2022 11:15 A M Medical Record Number: [id number] Patient Account  Number: [id number] Date of Birth/Sex: Treating RN: Jul 06, 1933 (89 y.o. Tammy Sours Primary Care Jourdin Gens: [place] Blas, FRA NCIS Other Clinician: Referring Teairra Millar: Treating Jecenia Leamer/Extender: Lenetta Quaker in Treatment: 1 Encounter Discharge Information Items Discharge Condition: Stable Ambulatory Status: Walker Discharge Destination: Home Transportation: Private Auto Accompanied By: nephew Schedule Follow-up Appointment: No Clinical Summary of Care: Notes tubgrip size D double layer. Electronic Signature(s) Signed: 08/22/2022 5:39:10 PM By: Shawn Stall RN, BSN Entered By: Shawn Stall on 08/22/2022 11:32:42 -------------------------------------------------------------------------------- Lower Extremity Assessment Details Patient Name: Date of Service: Jesus Oconnor, Jesus Oconnor. 08/22/2022 11:15 A M Medical Record Number: [id number] Patient Account Number: [id number] Date of Birth/Sex: Treating RN: August 11, 1933 (89 y.o. M) Primary Care Bradshaw Minihan: [place] Blas, Abel Presto NCIS Other Clinician: Referring Jahnya Trindade: Treating Jarely Juncaj/Extender: Lenetta Quaker in Treatment: 1 Edema Assessment Assessed: [Left: No] [Right: No] Edema: [Left: Yes] [Right: Yes] Calf Left: Right: Point of Measurement: 35 cm From Medial Instep 39.5 cm 39.5 cm Ankle Left: Right: Point of Measurement: 10 cm From Medial Instep 25 cm 25 cm TEDD, BACHMANN ([id number]) [phone].pdf Page 4 of 8 Electronic Signature(s) Signed: 08/22/2022 4:27:49 PM By: Thayer Dallas Entered By: Thayer Dallas on 08/22/2022 11:22:28 -------------------------------------------------------------------------------- Multi Wound Chart Details Patient Name: Date of Service: Veronia Beets RGE [facility]. 08/22/2022 11:15 A M Medical Record Number: [id number] Patient Account Number: [id number] Date of Birth/Sex: Treating RN: 1934/01/01 (89 y.o. M) Primary Care Banner Huckaba: [place] Blas, FRA  NCIS Other Clinician: Referring Yesena Reaves: Treating Lavar Rosenzweig/Extender: Lenetta Quaker in Treatment: 1 Vital Signs Height(in): 71 Capillary Blood Glucose(mg/dl): 176 Weight(lbs): 160 Pulse(bpm): 79 Body Mass Index(BMI): 27.3 Blood Pressure(mmHg): 116/67 Temperature(F): 97.9 Respiratory Rate(breaths/min): 18 [1:Photos:] [N/A:N/A] Right, Circumferential Lower Leg N/A N/A Wound Location: Trauma N/A N/A Wounding Event: Diabetic Wound/Ulcer of the Lower N/A N/A Primary Etiology: Extremity Venous Leg Ulcer N/A N/A Secondary  Etiology: Anemia, Chronic Obstructive N/A N/A Comorbid History: Pulmonary Disease (COPD), Arrhythmia, Congestive Heart Failure, Coronary Artery Disease, Hypertension, Type II Diabetes 07/11/2022 N/A N/A Date Acquired: 1 N/A N/A Weeks of Treatment: Healed - Epithelialized N/A N/A Wound Status: No N/A N/A Wound Recurrence: Yes N/A N/A Clustered Wound: 0 N/A N/A Clustered Quantity: 0x0x0 N/A N/A Measurements L x W x D (cm) 0 N/A N/A A (cm) : rea 0 N/A N/A Volume (cm) : 100.00% N/A N/A % Reduction in A rea: 100.00% N/A N/A % Reduction in Volume: Grade 1 N/A N/A Classification: Medium N/A N/A Exudate A mount: Serosanguineous N/A N/A Exudate Type: red, brown N/A N/A Exudate Color: Distinct, outline attached N/A N/A Wound Margin: None Present (0%) N/A N/A Granulation A mount: None Present (0%) N/A N/A Necrotic A mount: Fat Layer (Subcutaneous Tissue): Yes N/A N/A Exposed Structures: Fascia: No Tendon: No Muscle: No Joint: No Bone: No Large (67-100%) N/A N/A Epithelialization: Excoriation: No N/A N/A Periwound Skin Texture: Induration: No Callus: No Crepitus: No Rash: No PINKNEY, LISSNER ([id number]) [id number] Page 5 of 8 Scarring: No Dry/Scaly: Yes N/A N/A Periwound Skin Moisture: Maceration: No Hemosiderin Staining: Yes N/A N/A Periwound Skin Color: Atrophie Blanche:  No Cyanosis: No Ecchymosis: No Erythema: No Mottled: No Pallor: No Rubor: No Treatment Notes Electronic Signature(s) Signed: 08/22/2022 12:26:35 PM By: Geralyn Corwin DO Entered By: Geralyn Corwin on 08/22/2022 11:36:10 -------------------------------------------------------------------------------- Multi-Disciplinary Care Plan Details Patient Name: Date of Service: Jesus Oconnor, Jesus Oconnor. 08/22/2022 11:15 A M Medical Record Number: [id number] Patient Account Number: [id number] Date of Birth/Sex: Treating RN: Jul 12, 1933 (89 y.o. Tammy Sours Primary Care Tanyah Debruyne: [place] Blas, [place] NCIS Other Clinician: Referring Kaitlyn Skowron: Treating Ari Bernabei/Extender: Lenetta Quaker in Treatment: 1 Active Inactive Electronic Signature(s) Signed: 08/22/2022 5:39:10 PM By: Shawn Stall RN, BSN Entered By: Shawn Stall on 08/22/2022 11:30:57 -------------------------------------------------------------------------------- Pain Assessment Details Patient Name: Date of Service: Veronia Beets RGE [facility]. 08/22/2022 11:15 A M Medical Record Number: [id number] Patient Account Number: [id number] Date of Birth/Sex: Treating RN: April 05, 1933 (89 y.o. M) Primary Care Cleotha Whalin: [place] Blas, Abel Presto NCIS Other Clinician: Referring Hudsyn Champine: Treating Ethelbert Thain/Extender: Lenetta Quaker in Treatment: 1 Active Problems Location of Pain Severity and Description of Pain Patient Has Paino No Site Locations DENSEL, MEHROTRA ([id number]) [id number] Page 6 of 8 Pain Management and Medication Current Pain Management: Notes pain at night. wakes up after sleeping a few hours. Walking around helps Electronic Signature(s) Signed: 08/22/2022 4:27:49 PM By: Thayer Dallas Entered By: Thayer Dallas on 08/22/2022 11:14:49 -------------------------------------------------------------------------------- Patient/Caregiver Education Details Patient Name: Date of  Service: Rolin Barry 6/17/2024andnbsp11:15 A M Medical Record Number: [id number] Patient Account Number: [id number] Date of Birth/Gender: Treating RN: June 13, 1933 (89 y.o. Tammy Sours Primary Care Physician: Emily Blas, [place] NCIS Other Clinician: Referring Physician: Treating Physician/Extender: Lenetta Quaker in Treatment: 1 Education Assessment Education Provided To: Patient Education Topics Provided Wound/Skin Impairment: Handouts: Caring for Your Ulcer Methods: Explain/Verbal Responses: Reinforcements needed Electronic Signature(s) Signed: 08/22/2022 5:39:10 PM By: Shawn Stall RN, BSN Entered By: Shawn Stall on 08/22/2022 11:31:07 -------------------------------------------------------------------------------- Wound Assessment Details Patient Name: Date of Service: Jesus Oconnor, Jesus Oconnor. 08/22/2022 11:15 A Mcarthur Rossetti ([id number]) [id number] Page 7 of 8 Medical Record Number: [id number] Patient Account Number: [id number] Date of Birth/Sex: Treating RN: 06-21-1933 (89 y.o. Tammy Sours Primary Care Illyria Sobocinski: [place] Blas, [place] NCIS Other Clinician: Referring Raelynne Ludwick: Treating Ambert Virrueta/Extender: Venida Jarvis Weeks in Treatment: 1 Wound Status Wound  Number: 1 Primary Diabetic Wound/Ulcer of the Lower Extremity Etiology: Wound Location: Right, Circumferential Lower Leg Secondary Venous Leg Ulcer Wounding Event: Trauma Etiology: Date Acquired: 07/11/2022 Wound Healed - Epithelialized Weeks Of Treatment: 1 Status: Clustered Wound: Yes Comorbid Anemia, Chronic Obstructive Pulmonary Disease (COPD), History: Arrhythmia, Congestive Heart Failure, Coronary Artery Disease, Hypertension, Type II Diabetes Photos Wound Measurements Length: (cm) Width: (cm) Depth: (cm) Clustered Quantity: Area: (cm) Volume: (cm) 0 % Reduction in Area: 100% 0 % Reduction in Volume: 100% 0 Epithelialization:  Large (67-100%) 0 Tunneling: No 0 Undermining: No 0 Wound Description Classification: Grade 1 Wound Margin: Distinct, outline attached Exudate Amount: Medium Exudate Type: Serosanguineous Exudate Color: red, brown Foul Odor After Cleansing: No Slough/Fibrino Yes Wound Bed Granulation Amount: None Present (0%) Exposed Structure Necrotic Amount: None Present (0%) Fascia Exposed: No Fat Layer (Subcutaneous Tissue) Exposed: Yes Tendon Exposed: No Muscle Exposed: No Joint Exposed: No Bone Exposed: No Periwound Skin Texture Texture Color No Abnormalities Noted: No No Abnormalities Noted: No Callus: No Atrophie Blanche: No Crepitus: No Cyanosis: No Excoriation: No Ecchymosis: No Induration: No Erythema: No Rash: No Hemosiderin Staining: Yes Scarring: No Mottled: No Pallor: No Moisture Rubor: No No Abnormalities Noted: No Dry / Scaly: Yes Maceration: No Electronic Signature(s) Signed: 08/22/2022 5:39:10 PM By: Shawn Stall RN, BSN Entered By: Shawn Stall on 08/22/2022 11:29:11 Saintclair Halsted ([id number]) [id number] Page 8 of 8 -------------------------------------------------------------------------------- Vitals Details Patient Name: Date of Service: LANNEY, CARRITHERS [facility]. 08/22/2022 11:15 A M Medical Record Number: [id number] Patient Account Number: [id number] Date of Birth/Sex: Treating RN: [birth year] (89 y.o. M) Primary Care Jovani Flury: WO NG, FRA NCIS Other Clinician: Referring Jalyiah Shelley: Treating Azaria Stegman/Extender: Lenetta Quaker in Treatment: 1 Vital Signs Time Taken: 11:15 Temperature (F): 97.9 Height (in): 71 Pulse (bpm): 79 Weight (lbs): 196 Respiratory Rate (breaths/min): 18 Body Mass Index (BMI): 27.3 Blood Pressure (mmHg): 116/67 Capillary Blood Glucose (mg/dl): 010 Reference Range: 80 - 120 mg / dl Electronic Signature(s) Signed: 08/22/2022 4:27:49 PM By: Thayer Dallas Entered By: Thayer Dallas on 08/22/2022 11:22:07
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
## 2022-08-25 ENCOUNTER — Ambulatory Visit (HOSPITAL_BASED_OUTPATIENT_CLINIC_OR_DEPARTMENT_OTHER): Payer: Medicare Other | Admitting: Internal Medicine

## 2022-08-29 ENCOUNTER — Inpatient Hospital Stay: Payer: Medicare Other | Attending: Hematology | Admitting: Hematology

## 2022-08-29 ENCOUNTER — Inpatient Hospital Stay: Payer: Medicare Other

## 2022-09-05 ENCOUNTER — Other Ambulatory Visit: Payer: Self-pay | Admitting: Cardiovascular Disease

## 2022-09-05 DIAGNOSIS — I480 Paroxysmal atrial fibrillation: Secondary | ICD-10-CM

## 2022-09-16 ENCOUNTER — Ambulatory Visit: Payer: Medicare Other | Attending: Cardiovascular Disease | Admitting: Cardiovascular Disease

## 2022-09-16 ENCOUNTER — Encounter: Payer: Self-pay | Admitting: Cardiovascular Disease

## 2022-09-16 VITALS — BP 127/65 | HR 81 | Ht 71.0 in | Wt 194.8 lb

## 2022-09-16 DIAGNOSIS — I4821 Permanent atrial fibrillation: Secondary | ICD-10-CM | POA: Diagnosis not present

## 2022-09-16 DIAGNOSIS — I10 Essential (primary) hypertension: Secondary | ICD-10-CM

## 2022-09-16 DIAGNOSIS — Z794 Long term (current) use of insulin: Secondary | ICD-10-CM

## 2022-09-16 DIAGNOSIS — N189 Chronic kidney disease, unspecified: Secondary | ICD-10-CM

## 2022-09-16 DIAGNOSIS — I442 Atrioventricular block, complete: Secondary | ICD-10-CM

## 2022-09-16 DIAGNOSIS — I2510 Atherosclerotic heart disease of native coronary artery without angina pectoris: Secondary | ICD-10-CM

## 2022-09-16 DIAGNOSIS — Z950 Presence of cardiac pacemaker: Secondary | ICD-10-CM

## 2022-09-16 DIAGNOSIS — E1122 Type 2 diabetes mellitus with diabetic chronic kidney disease: Secondary | ICD-10-CM | POA: Diagnosis not present

## 2022-09-16 DIAGNOSIS — D6869 Other thrombophilia: Secondary | ICD-10-CM | POA: Diagnosis not present

## 2022-09-16 DIAGNOSIS — D509 Iron deficiency anemia, unspecified: Secondary | ICD-10-CM

## 2022-09-16 DIAGNOSIS — J432 Centrilobular emphysema: Secondary | ICD-10-CM

## 2022-09-16 DIAGNOSIS — N1832 Chronic kidney disease, stage 3b: Secondary | ICD-10-CM | POA: Diagnosis not present

## 2022-09-16 DIAGNOSIS — I5042 Chronic combined systolic (congestive) and diastolic (congestive) heart failure: Secondary | ICD-10-CM | POA: Diagnosis not present

## 2022-09-16 DIAGNOSIS — E785 Hyperlipidemia, unspecified: Secondary | ICD-10-CM

## 2022-09-16 DIAGNOSIS — E538 Deficiency of other specified B group vitamins: Secondary | ICD-10-CM | POA: Diagnosis not present

## 2022-09-16 LAB — LIPID PANEL
Chol/HDL Ratio: 2.7 ratio (ref 0.0–5.0)
Cholesterol, Total: 127 mg/dL (ref 100–199)
HDL: 47 mg/dL (ref >39)
LDL Chol Calc (NIH): 57 mg/dL (ref 0–99)
Triglycerides: 132 mg/dL (ref 0–149)

## 2022-09-16 LAB — CBC

## 2022-09-16 LAB — BASIC METABOLIC PANEL
CO2: 24 mmol/L (ref 20–29)
Chloride: 98 mmol/L (ref 96–106)
Sodium: 139 mmol/L (ref 134–144)
eGFR: 45 mL/min/{1.73_m2} — ABNORMAL LOW (ref >59)

## 2022-09-16 NOTE — Patient Instructions (Signed)
Medication Instructions:  Please call us if your weight increases to 200 pounds or over *If you need a refill on your cardiac medications before your next appointment, please call your pharmacy*   Lab Work: Lipid panel, BMP, CBC If you have labs (blood work) drawn today and your tests are completely normal, you will receive your results only by: MyChart Message (if you have MyChart) OR A paper copy in the mail If you have any lab test that is abnormal or we need to change your treatment, we will call you to review the results.  Follow-Up: At [facility], you and your health needs are our priority.  As part of our continuing mission to provide you with exceptional heart care, we have created designated Provider Care Teams.  These Care Teams include your primary Cardiologist (physician) and Advanced Practice Providers (APPs -  Physician Assistants and Nurse Practitioners) who all work together to provide you with the care you need, when you need it.  We recommend signing up for the patient portal called "MyChart".  Sign up information is provided on this After Visit Summary.  MyChart is used to connect with patients for Virtual Visits (Telemedicine).  Patients are able to view lab/test results, encounter notes, upcoming appointments, etc.  Non-urgent messages can be sent to your provider as well.   To learn more about what you can do with MyChart, go to [email or web address].    Your next appointment:   6 month(s)  Provider:   Thurmon Fair, MD
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
## 2022-09-17 ENCOUNTER — Encounter: Payer: Self-pay | Admitting: Cardiovascular Disease

## 2022-09-17 LAB — BASIC METABOLIC PANEL
BUN/Creatinine Ratio: 11 (ref 10–24)
BUN: 16 mg/dL (ref 8–27)
Calcium: 9.6 mg/dL (ref 8.6–10.2)
Creatinine, Ser: 1.5 mg/dL — ABNORMAL HIGH (ref 0.76–1.27)
Glucose: 141 mg/dL — ABNORMAL HIGH (ref 70–99)
Potassium: 4.4 mmol/L (ref 3.5–5.2)

## 2022-09-17 LAB — CBC
Hematocrit: 42.1 % (ref 37.5–51.0)
MCH: 30.3 pg (ref 26.6–33.0)
MCV: 94 fL (ref 79–97)
Platelets: 192 10*3/uL (ref 150–450)
RBC: 4.49 x10E6/uL (ref 4.14–5.80)
WBC: 6.3 10*3/uL (ref 3.4–10.8)

## 2022-09-17 LAB — LIPID PANEL: VLDL Cholesterol Cal: 23 mg/dL (ref 5–40)

## 2022-09-28 ENCOUNTER — Other Ambulatory Visit: Payer: Self-pay | Admitting: Cardiovascular Disease

## 2022-09-28 MED ORDER — DAPAGLIFLOZIN PROPANEDIOL 10 MG PO TABS: 10.0000 mg | ORAL_TABLET | Freq: Every day | ORAL | 3 refills | Status: DC

## 2022-10-18 ENCOUNTER — Ambulatory Visit (INDEPENDENT_AMBULATORY_CARE_PROVIDER_SITE_OTHER): Payer: Medicare Other

## 2022-10-18 DIAGNOSIS — I442 Atrioventricular block, complete: Secondary | ICD-10-CM | POA: Diagnosis not present

## 2022-10-25 ENCOUNTER — Other Ambulatory Visit: Payer: Self-pay | Admitting: Cardiovascular Disease

## 2022-10-26 MED ORDER — DAPAGLIFLOZIN PROPANEDIOL 10 MG PO TABS: 10.0000 mg | ORAL_TABLET | Freq: Every day | ORAL | 3 refills | Status: DC

## 2022-11-02 NOTE — Progress Notes (Signed)
Remote pacemaker transmission.   

## 2022-11-11 DIAGNOSIS — E538 Deficiency of other specified B group vitamins: Secondary | ICD-10-CM | POA: Diagnosis not present

## 2022-11-17 ENCOUNTER — Other Ambulatory Visit: Payer: Self-pay | Admitting: Cardiovascular Disease

## 2022-12-02 DIAGNOSIS — E538 Deficiency of other specified B group vitamins: Secondary | ICD-10-CM | POA: Diagnosis not present

## 2022-12-02 DIAGNOSIS — I130 Hypertensive heart and chronic kidney disease with heart failure and stage 1 through stage 4 chronic kidney disease, or unspecified chronic kidney disease: Secondary | ICD-10-CM | POA: Diagnosis not present

## 2022-12-02 DIAGNOSIS — N1832 Chronic kidney disease, stage 3b: Secondary | ICD-10-CM | POA: Diagnosis not present

## 2022-12-02 DIAGNOSIS — E559 Vitamin D deficiency, unspecified: Secondary | ICD-10-CM | POA: Diagnosis not present

## 2022-12-02 DIAGNOSIS — E785 Hyperlipidemia, unspecified: Secondary | ICD-10-CM | POA: Diagnosis not present

## 2022-12-02 DIAGNOSIS — Z7901 Long term (current) use of anticoagulants: Secondary | ICD-10-CM | POA: Diagnosis not present

## 2022-12-02 DIAGNOSIS — E1121 Type 2 diabetes mellitus with diabetic nephropathy: Secondary | ICD-10-CM | POA: Diagnosis not present

## 2022-12-02 DIAGNOSIS — I5032 Chronic diastolic (congestive) heart failure: Secondary | ICD-10-CM | POA: Diagnosis not present

## 2022-12-02 DIAGNOSIS — Z950 Presence of cardiac pacemaker: Secondary | ICD-10-CM | POA: Diagnosis not present

## 2022-12-02 DIAGNOSIS — I712 Thoracic aortic aneurysm, without rupture, unspecified: Secondary | ICD-10-CM | POA: Diagnosis not present

## 2022-12-02 DIAGNOSIS — E1122 Type 2 diabetes mellitus with diabetic chronic kidney disease: Secondary | ICD-10-CM | POA: Diagnosis not present

## 2022-12-02 DIAGNOSIS — E039 Hypothyroidism, unspecified: Secondary | ICD-10-CM | POA: Diagnosis not present

## 2022-12-02 LAB — BASIC METABOLIC PANEL: EGFR: 34

## 2022-12-06 DIAGNOSIS — H9202 Otalgia, left ear: Secondary | ICD-10-CM | POA: Diagnosis not present

## 2022-12-06 DIAGNOSIS — H6120 Impacted cerumen, unspecified ear: Secondary | ICD-10-CM | POA: Diagnosis not present

## 2022-12-08 ENCOUNTER — Ambulatory Visit (INDEPENDENT_AMBULATORY_CARE_PROVIDER_SITE_OTHER): Payer: Medicare Other | Admitting: Otolaryngology

## 2022-12-08 ENCOUNTER — Encounter (INDEPENDENT_AMBULATORY_CARE_PROVIDER_SITE_OTHER): Payer: Self-pay | Admitting: Otolaryngology

## 2022-12-08 ENCOUNTER — Ambulatory Visit (INDEPENDENT_AMBULATORY_CARE_PROVIDER_SITE_OTHER): Payer: Medicare Other | Admitting: Audiology

## 2022-12-08 VITALS — Ht 72.0 in | Wt 199.0 lb

## 2022-12-08 DIAGNOSIS — H6982 Other specified disorders of Eustachian tube, left ear: Secondary | ICD-10-CM

## 2022-12-08 DIAGNOSIS — H6123 Impacted cerumen, bilateral: Secondary | ICD-10-CM | POA: Insufficient documentation

## 2022-12-08 DIAGNOSIS — H6121 Impacted cerumen, right ear: Secondary | ICD-10-CM | POA: Diagnosis not present

## 2022-12-08 DIAGNOSIS — H906 Mixed conductive and sensorineural hearing loss, bilateral: Secondary | ICD-10-CM

## 2022-12-08 DIAGNOSIS — H65192 Other acute nonsuppurative otitis media, left ear: Secondary | ICD-10-CM | POA: Diagnosis not present

## 2022-12-08 DIAGNOSIS — H6523 Chronic serous otitis media, bilateral: Secondary | ICD-10-CM | POA: Insufficient documentation

## 2022-12-08 DIAGNOSIS — H903 Sensorineural hearing loss, bilateral: Secondary | ICD-10-CM | POA: Diagnosis not present

## 2022-12-08 DIAGNOSIS — H6522 Chronic serous otitis media, left ear: Secondary | ICD-10-CM | POA: Insufficient documentation

## 2022-12-08 DIAGNOSIS — H6983 Other specified disorders of Eustachian tube, bilateral: Secondary | ICD-10-CM | POA: Insufficient documentation

## 2022-12-08 NOTE — Progress Notes (Unsigned)
[facility] [address] [place], [place] [address]   Audiological Evaluation    History: Jesus Oconnor was referred today for a hearing evaluation by Dr. Janeece Riggers Philomena Doheny. He was previously seen at [facility] for an audiological evaluation on August 7th, 2019.   Tympanogram: Right ear: Could not obtain seal, unable to determine middle ear status at this time. Left ear: Normal external ear canal volume with no middle ear pressure and tympanic membrane compliance (Type B).   Hearing Evaluation: The audiogram was completed using conventional audiometric techniques under headphones with good-fair reliability.   The hearing test results indicate: Right ear: Moderately-severe mixed hearing loss from [phone] gradually sloping to profound mixed hearing loss from 2000-8000 Hz. Left ear: Severe mixed hearing loss from [phone] sloping to profound mixed hearing loss from 3000-8000 Hz.   Speech Recognition Thresholds were obtained at 50 dBHL in the right ear and 80 dBHL masked in the left ear.   Word Recognition Testing was completed with NU-6 word lists at 75 dBHL in the right ear and at 85 dBHL with 55 dBHL of masking noise in the left ear and the patient scored 96% in the right ear and 48% in the left ear.   Recommendations: Repeat audiogram when changes are perceived or per MD.   Jesus Oconnor Jesus Oconnor, AUD, CCC-A 12/08/22

## 2022-12-08 NOTE — Progress Notes (Signed)
Patient ID: Jesus Oconnor, male   DOB: March 12, 1933, 89 y.o.   MRN: [id number]  CC: Left ear hearing loss, left ear pain, left tympanic membrane perforation  HPI:  Jesus Oconnor is an 89 y.o. male who presents today complaining of left ear pain and left ear hearing loss for the past 3 days.  He noted a sudden pop in the left ear when he blew his nose, followed by drainage from the left ear.  He was recently evaluated by his primary care physician, and was noted to have a left tympanic membrane perforation.  The patient was treated with 5 days of amoxicillin.  The patient has a history of right ear cholesteatoma.  However, he has no previous otologic surgery.  According to the daughter, the patient has significant hearing difficulty.  Past Medical History:  Diagnosis Date   Anemia    history of   Arthritis    Atrial flutter (HCC)    severe bradycardia   CHF (congestive heart failure) (HCC)    Constipation    COPD (chronic obstructive pulmonary disease) (HCC)    Coronary artery disease    Cough    Diarrhea    Diastolic dysfunction    Echo 09/11/2010-grade I diastolic dysfunction,EF 55-60%   DM2 (diabetes mellitus, type 2) (HCC)    Dyslipidemia    History of kidney stones    HTN (hypertension)    Hyperlipidemia    Leg swelling    Mobitz type 1 second degree atrioventricular block    Pacemaker 05/22/2012   Dr  Croitoru    [employer] Adapta   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 SVG to firts & second diagonals,SVG to forth posterolateral branch to RCA   Sore throat    Wheezing     Past Surgical History:  Procedure Laterality Date   CABG x5  09/13/2010   Dr Heriberto Antigua to LAD,SVG to obtuse marginal,seq. SVG to first & second diagonals,SVG to fourth posterolateral branch RCA   CARDIAC CATHETERIZATION  09/10/2010   severe LAD,D1,D2 & ostial D2   CARDIAC CATHETERIZATION N/A 08/28/2015   Procedure: Right/Left Heart Cath and Coronary/Graft Angiography;  Surgeon:  Lennette Bihari, MD;  Location: [facility] INVASIVE CV LAB;  Service: Cardiovascular;  Laterality: N/A;   CHOLECYSTECTOMY  october 2012   CORONARY ARTERY BYPASS GRAFT     INSERT / REPLACE / REMOVE PACEMAKER  05/22/2012    dual chamber   PERMANENT PACEMAKER INSERTION N/A 05/22/2012   Procedure: PERMANENT PACEMAKER INSERTION;  Surgeon: Thurmon Fair, MD;  Location: [facility] CATH LAB;  Service: Cardiovascular;  Laterality: N/A;   repair of left indirect inguinal hernia with mesh.     repair of umbilical hernia.     TOTAL KNEE ARTHROPLASTY Right 09/26/2016   Procedure: TOTAL KNEE ARTHROPLASTY;  Surgeon: Gean Birchwood, MD;  Location: [facility] OR;  Service: Orthopedics;  Laterality: Right;    Family History  Problem Relation Age of Onset   Heart disease Father    Heart disease Brother        s/p cabg   Heart disease Maternal Uncle     Social History:  reports that he quit smoking about 12 years ago. His smoking use included cigarettes. He started smoking about 52 years ago. He has never used smokeless tobacco. He reports current alcohol use. He reports current drug use. Drug: Marijuana.  Allergies:  Allergies  Allergen Reactions   No Known Allergies     Height 6' (  1.829 m), weight 90.3 kg. Physical exam: Exam: General: Communicates with some difficulty, well nourished, no acute distress. Head: Normocephalic, no evidence injury, no tenderness, facial buttresses intact without stepoff. Face/sinus: No tenderness to palpation and percussion. Facial movement is normal and symmetric. Eyes: PERRL, EOMI. No scleral icterus, conjunctivae clear. Neuro: CN II exam reveals vision grossly intact.  No nystagmus at any point of gaze. Ears: Auricles well formed without lesions.  Right ear cerumen impaction.  Under the operating microscope, the right ear cerumen is carefully removed with a suction catheter.  The right tympanic membrane is severely retracted.  The left tympanic membrane is edematous and erythematous, with left  middle ear effusion.  No obvious perforation is noted today.  Nose: External evaluation reveals normal support and skin without lesions.  Dorsum is intact.  Anterior rhinoscopy reveals congested mucosa over anterior aspect of inferior turbinates and intact septum.  No purulence noted. Oral:  Oral cavity and oropharynx are intact, symmetric, without erythema or edema.  Mucosa is moist without lesions. Neck: Full range of motion without pain.  There is no significant lymphadenopathy.  No masses palpable.  Thyroid bed within normal limits to palpation.  Parotid glands and submandibular glands equal bilaterally without mass.  Trachea is midline. Neuro:  CN 2-12 grossly intact.    Procedure: Right ear cerumen disimpaction Anesthesia: None Description: Under the operating microscope, the cerumen is carefully removed with a combination of cerumen currette, alligator forceps, and suction catheters.  After the cerumen is removed, the TMs are noted to be normal.  No mass, erythema, or lesions. The patient tolerated the procedure well.    Assessment: 1.  Right ear cerumen impaction.  After the disimpaction procedure, the right tympanic membrane is noted to be severely retracted. 2.  Acute left otitis media with erythematous and edematous left tympanic membrane.  Left middle ear effusion is noted.  The previously noted left tympanic membrane perforation has healed. 3.  Bilateral sensorineural hearing loss, with additional left ear conductive hearing loss.  Plan: 1.  Otomicroscopy with right ear cerumen disimpaction. 2.  The physical exam findings are reviewed with the patient and his daughter. 3.  Complete his current course of oral amoxicillin. 4.  Ciprodex eardrops 4 drops left ear twice daily for 10 days. 5.  The patient will return for reevaluation in 2 weeks.  Jaidence Geisler W Shayanne Gomm 12/08/2022, 5:54 PM

## 2022-12-09 ENCOUNTER — Telehealth: Payer: Self-pay | Admitting: Cardiovascular Disease

## 2022-12-09 MED ORDER — TORSEMIDE 60 MG PO TABS: 60.0000 mg | ORAL_TABLET | Freq: Every day | ORAL | 2 refills | Status: DC

## 2022-12-09 NOTE — Telephone Encounter (Signed)
Pt's medication was sent to pt's pharmacy as requested. Confirmation received.  °

## 2022-12-09 NOTE — Telephone Encounter (Signed)
*  STAT* If patient is at the pharmacy, call can be transferred to refill team.   1. Which medications need to be refilled? (please list name of each medication and dose if known)   Torsemide 60 MG TABS   2. Would you like to learn more about the convenience, safety, & potential cost savings by using the [facility]?   3. Are you open to using the [facility] (Type [facility]. ).  4. Which pharmacy/location (including street and city if local pharmacy) is medication to be sent to?  [id number] - [place], [place] - [address] AT [facility] OF [address]   5. Do they need a 30 day or 90 day supply?   90 day  Daughter Elita Quick) stated patient still has some medication.

## 2022-12-13 ENCOUNTER — Other Ambulatory Visit (HOSPITAL_COMMUNITY): Payer: Self-pay

## 2022-12-14 ENCOUNTER — Telehealth: Payer: Self-pay | Admitting: Pharmacy Technician

## 2022-12-14 ENCOUNTER — Other Ambulatory Visit (HOSPITAL_COMMUNITY): Payer: Self-pay

## 2022-12-14 MED ORDER — TORSEMIDE 60 MG PO TABS: 60.0000 mg | ORAL_TABLET | Freq: Every day | ORAL | 3 refills | Status: DC

## 2022-12-14 NOTE — Telephone Encounter (Signed)
Called the patient and let him know that the PA for Torsemide was approved. He has not gotten the medication yet and is unsure if the pharmacy has gotten it ready?  Sent in "refill" of this medication with a note saying that the PA was approved.  Informed him that a new prescription has been sent in. He verbalized understanding.

## 2022-12-14 NOTE — Telephone Encounter (Signed)
Pharmacy Patient Advocate Encounter   Received notification from Fax that prior authorization for Dan Humphreys is required/requested.   Insurance verification completed.   The patient is insured through [employer] .   Per test claim: PA required; PA submitted to [facility] via CoverMyMeds Key/confirmation #/EOC [id number] Status is pending

## 2022-12-14 NOTE — Telephone Encounter (Signed)
Pharmacy Patient Advocate Encounter  Received notification from [facility] that Prior Authorization for Jesus Oconnor has been APPROVED from 12/14/22 to 03/06/24   PA #/Case ID/Reference #: [id number]

## 2022-12-14 NOTE — Addendum Note (Signed)
Addended by: Scheryl Marten on: 12/14/2022 02:49 PM   Modules accepted: Orders

## 2022-12-15 ENCOUNTER — Other Ambulatory Visit: Payer: Self-pay | Admitting: Nurse Practitioner

## 2022-12-16 ENCOUNTER — Other Ambulatory Visit: Payer: Self-pay | Admitting: Nurse Practitioner

## 2022-12-16 ENCOUNTER — Other Ambulatory Visit: Payer: Self-pay | Admitting: Physician Assistant

## 2022-12-16 ENCOUNTER — Telehealth: Payer: Self-pay | Admitting: Cardiovascular Disease

## 2022-12-16 ENCOUNTER — Telehealth: Payer: Self-pay | Admitting: Physician Assistant

## 2022-12-16 MED ORDER — TORSEMIDE 20 MG PO TABS: 60.0000 mg | ORAL_TABLET | Freq: Every day | ORAL | 3 refills | Status: DC

## 2022-12-16 NOTE — Telephone Encounter (Signed)
Called pt's pharmacy. Was on hold for 30 minutes, did not speak to anymore.

## 2022-12-16 NOTE — Telephone Encounter (Signed)
Patient's family paged after hour answering service last he has ran out of torsemide.  I see Dr. Royann Shivers has sending a prescription of 60 mg torsemide 2 days ago, however his pharmacy reported that they do not have the prescription.  It is likely that 60 mg torsemide was not on their formulary.  Will prescribe 20 mg torsemide and he take 3 tablets every morning.

## 2022-12-16 NOTE — Telephone Encounter (Signed)
Pt c/o medication issue:  1. Name of Medication: Torsemide 60 MG TABS   2. How are you currently taking this medication (dosage and times per day)?   3. Are you having a reaction (difficulty breathing--STAT)?   4. What is your medication issue? Patient's daughter is stating that the [facility] that this med was sent to was not received. She would like it changed to:  [id number] - [place], [place] - [address] AT [facility]   Requesting this be sent again.

## 2022-12-19 ENCOUNTER — Other Ambulatory Visit: Payer: Self-pay

## 2022-12-22 ENCOUNTER — Other Ambulatory Visit: Payer: Self-pay | Admitting: Cardiovascular Disease

## 2022-12-23 ENCOUNTER — Encounter (INDEPENDENT_AMBULATORY_CARE_PROVIDER_SITE_OTHER): Payer: Self-pay

## 2022-12-23 ENCOUNTER — Ambulatory Visit (INDEPENDENT_AMBULATORY_CARE_PROVIDER_SITE_OTHER): Payer: Medicare Other | Admitting: Audiology

## 2022-12-23 ENCOUNTER — Ambulatory Visit (INDEPENDENT_AMBULATORY_CARE_PROVIDER_SITE_OTHER): Payer: Medicare Other

## 2022-12-23 VITALS — Ht 70.0 in | Wt 196.0 lb

## 2022-12-23 DIAGNOSIS — H6982 Other specified disorders of Eustachian tube, left ear: Secondary | ICD-10-CM

## 2022-12-23 DIAGNOSIS — H6522 Chronic serous otitis media, left ear: Secondary | ICD-10-CM

## 2022-12-23 DIAGNOSIS — H903 Sensorineural hearing loss, bilateral: Secondary | ICD-10-CM

## 2022-12-23 DIAGNOSIS — H906 Mixed conductive and sensorineural hearing loss, bilateral: Secondary | ICD-10-CM | POA: Diagnosis not present

## 2022-12-23 NOTE — Progress Notes (Signed)
[address] [place], [place] [address] [phone]  Audiological Evaluation    Name: Jesus Oconnor     DOB:   11/10/1933      MRN:   [id number]                                                                                     Service Date: 12/23/2022        Patient was referred today for a hearing evaluation by Dr. Karle Barr. He was previously seen two weeks ago.   Tympanogram: Right ear: Normal external ear canal volume with normal middle ear pressure and tympanic membrane compliance (Type A). Left ear: Normal external ear canal volume with normal middle ear pressure and tympanic membrane compliance (Type A).   Hearing Evaluation: The audiogram was completed using conventional audiometric techniques under headphones with good-fair reliability.   The hearing test results indicate: Right ear: Moderate mixed hearing loss from [id number] Hz sloping to severe mixed hearing loss from 4000-8000 Hz. Left ear: Moderate mixed hearing loss from [phone] Hz sloping to profound mixed hearing loss from 3000-8000 Hz.   Speech Recognition Thresholds were obtained at 55 dBHL in the right ear and 55 dBHL masked in the left ear.   Word Recognition Testing was completed with NU-6 word lists at 75 dBHL in the right ear and at 75 dBHL with 45 dBHL of masking noise in the left ear and the patient scored 88% in the right ear and 80% in the left ear.   Today's test results indicate that there has been an improvement in hearing and word recognition scores for the left ear when compared to the previous audiogram on file.    Recommendations: Repeat audiogram when changes are perceived or per MD.    Conley Rolls Ghalia Reicks, AUD, CCC-A 12/23/22

## 2022-12-23 NOTE — Telephone Encounter (Signed)
Refill has been done in another encounter.

## 2022-12-24 NOTE — Progress Notes (Unsigned)
Patient ID: Jesus Oconnor, male   DOB: [birth year], 89 y.o.   MRN: [id number]  Follow-up: Left ear infection, left middle ear effusion, left ear conductive hearing loss  HPI: The patient is an 89 year old male who returns today for his follow-up evaluation.  He was last seen 2 weeks ago.  At that time, he was noted to have an acute left ear infection.  Left middle ear effusion and left ear conductive hearing loss were noted.  The patient was treated with oral amoxicillin and Ciprodex eardrops.  The patient returns today reporting improvement in his hearing.  He denies any otalgia or otorrhea.  Exam: General: Communicates without difficulty, well nourished, no acute distress. Head: Normocephalic, no evidence injury, no tenderness, facial buttresses intact without stepoff. Face/sinus: No tenderness to palpation and percussion. Facial movement is normal and symmetric. Eyes: PERRL, EOMI. No scleral icterus, conjunctivae clear. Neuro: CN II exam reveals vision grossly intact.  No nystagmus at any point of gaze. Ears: Auricles well formed without lesions.  Ear canals are intact without mass or lesion.  No erythema or edema is appreciated.  The TMs are intact but retracted.  No middle ear effusion or infection is noted.  Nose: External evaluation reveals normal support and skin without lesions.  Dorsum is intact.  Anterior rhinoscopy reveals congested mucosa over anterior aspect of inferior turbinates and intact septum.  No purulence noted. Oral:  Oral cavity and oropharynx are intact, symmetric, without erythema or edema.  Mucosa is moist without lesions. Neck: Full range of motion without pain.  There is no significant lymphadenopathy.  No masses palpable.  Thyroid bed within normal limits to palpation.  Parotid glands and submandibular glands equal bilaterally without mass.  Trachea is midline. Neuro:  CN 2-12 grossly intact.    The patient's hearing test shows bilateral sensorineural hearing loss.  The  previously noted left ear conductive hearing loss has resolved.  Assessment: 1.  The patient's left ear infection and left middle ear effusion have resolved. 2.  Bilateral sensorineural hearing loss.  His previously noted left ear conductive hearing loss has resolved.  Plan: 1.  The physical exam findings and the hearing test results are reviewed with the patient. 2.  The patient is encouraged to perform most exercise multiple times a day. 3.  The patient will return for reevaluation in 6 months, sooner if needed.

## 2023-01-06 DIAGNOSIS — E538 Deficiency of other specified B group vitamins: Secondary | ICD-10-CM | POA: Diagnosis not present

## 2023-01-17 ENCOUNTER — Ambulatory Visit (INDEPENDENT_AMBULATORY_CARE_PROVIDER_SITE_OTHER): Payer: Medicare Other

## 2023-01-17 DIAGNOSIS — I442 Atrioventricular block, complete: Secondary | ICD-10-CM | POA: Diagnosis not present

## 2023-01-20 LAB — CUP PACEART REMOTE DEVICE CHECK
Battery Impedance: 2437 Ohm
Battery Remaining Longevity: 32 mo
Battery Voltage: 2.75 V
Brady Statistic RV Percent Paced: 98 %
Date Time Interrogation Session: 20241114204544
Implantable Lead Connection Status: 753985
Implantable Lead Connection Status: 753985
Implantable Lead Implant Date: 20140318
Implantable Lead Implant Date: 20140318
Implantable Lead Location: 753859
Implantable Lead Location: 753860
Implantable Lead Model: 5076
Implantable Lead Model: 5076
Implantable Pulse Generator Implant Date: 20140318
Lead Channel Impedance Value: 657 Ohm
Lead Channel Impedance Value: 67 Ohm
Lead Channel Pacing Threshold Amplitude: 0.875 V
Lead Channel Pacing Threshold Pulse Width: 0.4 ms
Lead Channel Setting Pacing Amplitude: 2.5 V
Lead Channel Setting Pacing Pulse Width: 0.4 ms
Lead Channel Setting Sensing Sensitivity: 4 mV
Zone Setting Status: 755011
Zone Setting Status: 755011

## 2023-02-14 NOTE — Progress Notes (Signed)
Remote pacemaker transmission.   

## 2023-02-20 ENCOUNTER — Other Ambulatory Visit: Payer: Self-pay | Admitting: Cardiovascular Disease

## 2023-02-20 DIAGNOSIS — I480 Paroxysmal atrial fibrillation: Secondary | ICD-10-CM

## 2023-02-24 ENCOUNTER — Ambulatory Visit: Payer: Medicare Other | Attending: Cardiovascular Disease | Admitting: Cardiovascular Disease

## 2023-02-24 ENCOUNTER — Other Ambulatory Visit: Payer: Self-pay | Admitting: Cardiovascular Disease

## 2023-02-24 DIAGNOSIS — I480 Paroxysmal atrial fibrillation: Secondary | ICD-10-CM

## 2023-02-27 ENCOUNTER — Encounter: Payer: Self-pay | Admitting: Cardiovascular Disease

## 2023-03-03 DIAGNOSIS — E538 Deficiency of other specified B group vitamins: Secondary | ICD-10-CM | POA: Diagnosis not present

## 2023-04-16 ENCOUNTER — Other Ambulatory Visit: Payer: Self-pay | Admitting: Nurse Practitioner

## 2023-04-18 ENCOUNTER — Ambulatory Visit (INDEPENDENT_AMBULATORY_CARE_PROVIDER_SITE_OTHER): Payer: Medicare Other

## 2023-04-18 DIAGNOSIS — I442 Atrioventricular block, complete: Secondary | ICD-10-CM

## 2023-04-18 DIAGNOSIS — H0102B Squamous blepharitis left eye, upper and lower eyelids: Secondary | ICD-10-CM | POA: Diagnosis not present

## 2023-04-18 DIAGNOSIS — E119 Type 2 diabetes mellitus without complications: Secondary | ICD-10-CM | POA: Diagnosis not present

## 2023-04-18 DIAGNOSIS — H43813 Vitreous degeneration, bilateral: Secondary | ICD-10-CM | POA: Diagnosis not present

## 2023-04-18 DIAGNOSIS — H0102A Squamous blepharitis right eye, upper and lower eyelids: Secondary | ICD-10-CM | POA: Diagnosis not present

## 2023-04-18 DIAGNOSIS — Z961 Presence of intraocular lens: Secondary | ICD-10-CM | POA: Diagnosis not present

## 2023-04-20 LAB — CUP PACEART REMOTE DEVICE CHECK
Battery Impedance: 2464 Ohm
Battery Remaining Longevity: 33 mo
Battery Voltage: 2.74 V
Brady Statistic RV Percent Paced: 98 %
Date Time Interrogation Session: 20250212202213
Implantable Lead Connection Status: 753985
Implantable Lead Connection Status: 753985
Implantable Lead Implant Date: 20140318
Implantable Lead Implant Date: 20140318
Implantable Lead Location: 753859
Implantable Lead Location: 753860
Implantable Lead Model: 5076
Implantable Lead Model: 5076
Implantable Pulse Generator Implant Date: 20140318
Lead Channel Impedance Value: 67 Ohm
Lead Channel Impedance Value: 711 Ohm
Lead Channel Pacing Threshold Amplitude: 0.75 V
Lead Channel Pacing Threshold Pulse Width: 0.4 ms
Lead Channel Setting Pacing Amplitude: 2.5 V
Lead Channel Setting Pacing Pulse Width: 0.4 ms
Lead Channel Setting Sensing Sensitivity: 2.8 mV
Zone Setting Status: 755011
Zone Setting Status: 755011

## 2023-04-27 ENCOUNTER — Encounter: Payer: Self-pay | Admitting: Cardiovascular Disease

## 2023-05-01 ENCOUNTER — Encounter: Payer: Self-pay | Admitting: Cardiovascular Disease

## 2023-05-01 MED ORDER — TORSEMIDE 20 MG PO TABS: 60.0000 mg | ORAL_TABLET | Freq: Every day | ORAL | 3 refills | Status: DC

## 2023-05-01 NOTE — Telephone Encounter (Signed)
 Refill sent to [facility] on [address] for torsemide.

## 2023-05-24 ENCOUNTER — Telehealth (INDEPENDENT_AMBULATORY_CARE_PROVIDER_SITE_OTHER): Payer: Self-pay | Admitting: Otolaryngology

## 2023-05-24 NOTE — Telephone Encounter (Signed)
 Unable to leave message on patients voicemail. Called daughter and left message to reschedule April 25th appointment with Dr. Suszanne Conners. Provider will be out of office that day.

## 2023-05-29 NOTE — Progress Notes (Signed)
 Remote pacemaker transmission.

## 2023-05-29 NOTE — Addendum Note (Signed)
 Addended by: Geralyn Flash D on: 05/29/2023 04:18 PM   Modules accepted: Orders

## 2023-06-02 ENCOUNTER — Ambulatory Visit: Payer: Medicare Other | Attending: Cardiovascular Disease | Admitting: Cardiovascular Disease

## 2023-06-02 ENCOUNTER — Encounter: Payer: Self-pay | Admitting: Cardiovascular Disease

## 2023-06-02 VITALS — BP 118/66 | HR 78 | Ht 70.0 in | Wt 198.8 lb

## 2023-06-02 DIAGNOSIS — I7121 Aneurysm of the ascending aorta, without rupture: Secondary | ICD-10-CM | POA: Diagnosis not present

## 2023-06-02 DIAGNOSIS — I25810 Atherosclerosis of coronary artery bypass graft(s) without angina pectoris: Secondary | ICD-10-CM | POA: Diagnosis not present

## 2023-06-02 DIAGNOSIS — I442 Atrioventricular block, complete: Secondary | ICD-10-CM | POA: Diagnosis not present

## 2023-06-02 DIAGNOSIS — Z794 Long term (current) use of insulin: Secondary | ICD-10-CM | POA: Diagnosis not present

## 2023-06-02 DIAGNOSIS — J432 Centrilobular emphysema: Secondary | ICD-10-CM

## 2023-06-02 DIAGNOSIS — E785 Hyperlipidemia, unspecified: Secondary | ICD-10-CM

## 2023-06-02 DIAGNOSIS — Z950 Presence of cardiac pacemaker: Secondary | ICD-10-CM

## 2023-06-02 DIAGNOSIS — I5042 Chronic combined systolic (congestive) and diastolic (congestive) heart failure: Secondary | ICD-10-CM | POA: Diagnosis not present

## 2023-06-02 DIAGNOSIS — E1122 Type 2 diabetes mellitus with diabetic chronic kidney disease: Secondary | ICD-10-CM

## 2023-06-02 DIAGNOSIS — N1832 Chronic kidney disease, stage 3b: Secondary | ICD-10-CM

## 2023-06-02 DIAGNOSIS — D6869 Other thrombophilia: Secondary | ICD-10-CM

## 2023-06-02 DIAGNOSIS — I4821 Permanent atrial fibrillation: Secondary | ICD-10-CM
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
## 2023-06-02 NOTE — Patient Instructions (Signed)
 Medication Instructions:  No changes *If you need a refill on your cardiac medications before your next appointment, please call your pharmacy*  Follow-Up: At [facility], you and your health needs are our priority.  As part of our continuing mission to provide you with exceptional heart care, our providers are all part of one team.  This team includes your primary Cardiologist (physician) and Advanced Practice Providers or APPs (Physician Assistants and Nurse Practitioners) who all work together to provide you with the care you need, when you need it.  Your next appointment:   1 year(s)  Provider:   Thurmon Fair, MD     We recommend signing up for the patient portal called "MyChart".  Sign up information is provided on this After Visit Summary.  MyChart is used to connect with patients for Virtual Visits (Telemedicine).  Patients are able to view lab/test results, encounter notes, upcoming appointments, etc.  Non-urgent messages can be sent to your provider as well.   To learn more about what you can do with MyChart, go to [email or web address].        1st Floor: - Lobby - Registration  - Pharmacy  - Lab - Cafe  2nd Floor: - PV Lab - Diagnostic Testing (echo, CT, nuclear med)  3rd Floor: - Vacant  4th Floor: - TCTS (cardiothoracic surgery) - AFib Clinic - Structural Heart Clinic - Vascular Surgery  - Vascular Ultrasound  5th Floor: - HeartCare Cardiology (general and EP) - Clinical Pharmacy for coumadin, hypertension, lipid, weight-loss medications, and med management appointments    Valet parking services will be available as well.

## 2023-06-09 DIAGNOSIS — E538 Deficiency of other specified B group vitamins: Secondary | ICD-10-CM | POA: Diagnosis not present

## 2023-06-18 ENCOUNTER — Other Ambulatory Visit: Payer: Self-pay | Admitting: Cardiovascular Disease

## 2023-06-28 DIAGNOSIS — N189 Chronic kidney disease, unspecified: Secondary | ICD-10-CM | POA: Diagnosis not present

## 2023-06-29 ENCOUNTER — Other Ambulatory Visit: Payer: Self-pay | Admitting: *Deleted

## 2023-06-29 DIAGNOSIS — I480 Paroxysmal atrial fibrillation: Secondary | ICD-10-CM

## 2023-06-29 MED ORDER — APIXABAN 2.5 MG PO TABS: 2.5000 mg | ORAL_TABLET | Freq: Two times a day (BID) | ORAL | 1 refills | Status: DC

## 2023-06-29 NOTE — Telephone Encounter (Signed)
 Eliquis  2.5mg  refill request received. Patient is 89 years old, weight-90.2kg, Crea-1.50 on 09/16/22, Diagnosis-Afib, and last seen by Dr. Alvis Ba on 06/02/23. Dose is appropriate based on dosing criteria. Will send in refill to requested pharmacy.

## 2023-06-30 ENCOUNTER — Ambulatory Visit (INDEPENDENT_AMBULATORY_CARE_PROVIDER_SITE_OTHER): Payer: Medicare Other

## 2023-06-30 DIAGNOSIS — E538 Deficiency of other specified B group vitamins: Secondary | ICD-10-CM | POA: Diagnosis not present

## 2023-06-30 DIAGNOSIS — J449 Chronic obstructive pulmonary disease, unspecified: Secondary | ICD-10-CM | POA: Diagnosis not present

## 2023-06-30 DIAGNOSIS — Z23 Encounter for immunization: Secondary | ICD-10-CM | POA: Diagnosis not present

## 2023-06-30 DIAGNOSIS — E1121 Type 2 diabetes mellitus with diabetic nephropathy: Secondary | ICD-10-CM | POA: Diagnosis not present

## 2023-06-30 DIAGNOSIS — E559 Vitamin D deficiency, unspecified: Secondary | ICD-10-CM | POA: Diagnosis not present

## 2023-06-30 DIAGNOSIS — N1832 Chronic kidney disease, stage 3b: Secondary | ICD-10-CM | POA: Diagnosis not present

## 2023-06-30 DIAGNOSIS — E785 Hyperlipidemia, unspecified: Secondary | ICD-10-CM | POA: Diagnosis not present

## 2023-06-30 DIAGNOSIS — Z950 Presence of cardiac pacemaker: Secondary | ICD-10-CM | POA: Diagnosis not present

## 2023-06-30 DIAGNOSIS — I77810 Thoracic aortic ectasia: Secondary | ICD-10-CM | POA: Diagnosis not present

## 2023-06-30 DIAGNOSIS — I10 Essential (primary) hypertension: Secondary | ICD-10-CM | POA: Diagnosis not present

## 2023-07-12 DIAGNOSIS — N1832 Chronic kidney disease, stage 3b: Secondary | ICD-10-CM | POA: Diagnosis not present

## 2023-07-12 DIAGNOSIS — I509 Heart failure, unspecified: Secondary | ICD-10-CM | POA: Diagnosis not present

## 2023-07-12 DIAGNOSIS — I129 Hypertensive chronic kidney disease with stage 1 through stage 4 chronic kidney disease, or unspecified chronic kidney disease: Secondary | ICD-10-CM | POA: Diagnosis not present

## 2023-07-12 DIAGNOSIS — R809 Proteinuria, unspecified: Secondary | ICD-10-CM | POA: Diagnosis not present

## 2023-07-18 ENCOUNTER — Ambulatory Visit (INDEPENDENT_AMBULATORY_CARE_PROVIDER_SITE_OTHER): Payer: Medicare Other

## 2023-07-18 DIAGNOSIS — I442 Atrioventricular block, complete: Secondary | ICD-10-CM

## 2023-07-20 LAB — CUP PACEART REMOTE DEVICE CHECK
Battery Impedance: 2686 Ohm
Battery Remaining Longevity: 28 mo
Battery Voltage: 2.74 V
Brady Statistic RV Percent Paced: 99 %
Date Time Interrogation Session: 20250515122809
Implantable Lead Connection Status: 753985
Implantable Lead Connection Status: 753985
Implantable Lead Implant Date: 20140318
Implantable Lead Implant Date: 20140318
Implantable Lead Location: 753859
Implantable Lead Location: 753860
Implantable Lead Model: 5076
Implantable Lead Model: 5076
Implantable Pulse Generator Implant Date: 20140318
Lead Channel Impedance Value: 624 Ohm
Lead Channel Impedance Value: 67 Ohm
Lead Channel Pacing Threshold Amplitude: 0.625 V
Lead Channel Pacing Threshold Pulse Width: 0.4 ms
Lead Channel Setting Pacing Amplitude: 2.5 V
Lead Channel Setting Pacing Pulse Width: 0.46 ms
Lead Channel Setting Sensing Sensitivity: 4 mV
Zone Setting Status: 755011
Zone Setting Status: 755011

## 2023-07-28 ENCOUNTER — Ambulatory Visit (INDEPENDENT_AMBULATORY_CARE_PROVIDER_SITE_OTHER): Admitting: Otolaryngology

## 2023-07-28 ENCOUNTER — Ambulatory Visit: Payer: Self-pay | Admitting: Cardiovascular Disease

## 2023-09-01 DIAGNOSIS — E039 Hypothyroidism, unspecified: Secondary | ICD-10-CM | POA: Diagnosis not present

## 2023-09-01 DIAGNOSIS — E1121 Type 2 diabetes mellitus with diabetic nephropathy: Secondary | ICD-10-CM | POA: Diagnosis not present

## 2023-09-01 DIAGNOSIS — E538 Deficiency of other specified B group vitamins: Secondary | ICD-10-CM | POA: Diagnosis not present

## 2023-09-01 NOTE — Progress Notes (Signed)
 Remote pacemaker transmission.

## 2023-09-01 NOTE — Addendum Note (Signed)
 Addended by: VICCI SELLER A on: 09/01/2023 02:50 PM   Modules accepted: Orders

## 2023-09-11 ENCOUNTER — Other Ambulatory Visit: Payer: Self-pay | Admitting: Cardiovascular Disease

## 2023-09-11 DIAGNOSIS — I480 Paroxysmal atrial fibrillation: Secondary | ICD-10-CM

## 2023-09-12 NOTE — Telephone Encounter (Signed)
Prescription refill request for Eliquis received. Indication: Last office visit: Scr: Age:  Weight:

## 2023-09-29 ENCOUNTER — Other Ambulatory Visit: Payer: Self-pay | Admitting: Cardiovascular Disease

## 2023-10-02 DIAGNOSIS — E538 Deficiency of other specified B group vitamins: Secondary | ICD-10-CM | POA: Diagnosis not present

## 2023-10-05 DIAGNOSIS — R0981 Nasal congestion: Secondary | ICD-10-CM | POA: Diagnosis not present

## 2023-10-05 DIAGNOSIS — L819 Disorder of pigmentation, unspecified: Secondary | ICD-10-CM | POA: Diagnosis not present

## 2023-10-05 DIAGNOSIS — J449 Chronic obstructive pulmonary disease, unspecified: Secondary | ICD-10-CM | POA: Diagnosis not present

## 2023-10-13 ENCOUNTER — Other Ambulatory Visit: Payer: Self-pay | Admitting: Cardiovascular Disease

## 2023-10-17 ENCOUNTER — Ambulatory Visit (INDEPENDENT_AMBULATORY_CARE_PROVIDER_SITE_OTHER): Payer: Medicare Other

## 2023-10-17 DIAGNOSIS — I442 Atrioventricular block, complete: Secondary | ICD-10-CM | POA: Diagnosis not present

## 2023-10-23 ENCOUNTER — Other Ambulatory Visit: Payer: Self-pay | Admitting: Cardiovascular Disease

## 2023-10-23 ENCOUNTER — Other Ambulatory Visit: Payer: Self-pay

## 2023-10-23 LAB — CUP PACEART REMOTE DEVICE CHECK
Battery Impedance: 2721 Ohm
Battery Remaining Longevity: 28 mo
Battery Voltage: 2.74 V
Brady Statistic RV Percent Paced: 99 %
Date Time Interrogation Session: 20250815172912
Implantable Lead Connection Status: 753985
Implantable Lead Connection Status: 753985
Implantable Lead Implant Date: 20140318
Implantable Lead Implant Date: 20140318
Implantable Lead Location: 753859
Implantable Lead Location: 753860
Implantable Lead Model: 5076
Implantable Lead Model: 5076
Implantable Pulse Generator Implant Date: 20140318
Lead Channel Impedance Value: 551 Ohm
Lead Channel Impedance Value: 67 Ohm
Lead Channel Pacing Threshold Amplitude: 0.625 V
Lead Channel Pacing Threshold Pulse Width: 0.4 ms
Lead Channel Setting Pacing Amplitude: 2.5 V
Lead Channel Setting Pacing Pulse Width: 0.4 ms
Lead Channel Setting Sensing Sensitivity: 2.8 mV
Zone Setting Status: 755011
Zone Setting Status: 755011

## 2023-10-23 MED ORDER — DAPAGLIFLOZIN PROPANEDIOL 10 MG PO TABS: 10.0000 mg | ORAL_TABLET | Freq: Every day | ORAL | 2 refills | Status: DC

## 2023-10-26 ENCOUNTER — Encounter (INDEPENDENT_AMBULATORY_CARE_PROVIDER_SITE_OTHER): Payer: Self-pay | Admitting: Otolaryngology

## 2023-10-26 ENCOUNTER — Ambulatory Visit (INDEPENDENT_AMBULATORY_CARE_PROVIDER_SITE_OTHER): Admitting: Otolaryngology

## 2023-10-26 VITALS — BP 121/67 | HR 90

## 2023-10-26 DIAGNOSIS — J310 Chronic rhinitis: Secondary | ICD-10-CM

## 2023-10-26 DIAGNOSIS — H6983 Other specified disorders of Eustachian tube, bilateral: Secondary | ICD-10-CM | POA: Diagnosis not present

## 2023-10-26 DIAGNOSIS — H6523 Chronic serous otitis media, bilateral: Secondary | ICD-10-CM

## 2023-10-26 DIAGNOSIS — H903 Sensorineural hearing loss, bilateral: Secondary | ICD-10-CM | POA: Diagnosis not present

## 2023-10-26 DIAGNOSIS — H6123 Impacted cerumen, bilateral: Secondary | ICD-10-CM

## 2023-10-26 DIAGNOSIS — R0981 Nasal congestion: Secondary | ICD-10-CM

## 2023-10-27 ENCOUNTER — Telehealth: Payer: Self-pay | Admitting: Cardiovascular Disease

## 2023-10-27 MED ORDER — DAPAGLIFLOZIN PROPANEDIOL 10 MG PO TABS: 10.0000 mg | ORAL_TABLET | Freq: Every day | ORAL | 1 refills | Status: DC

## 2023-10-27 NOTE — Telephone Encounter (Signed)
 Pt's medication was sent to pt's pharmacy as requested. Confirmation received.

## 2023-10-27 NOTE — Telephone Encounter (Signed)
*  STAT* If patient is at the pharmacy, call can be transferred to refill team.   1. Which medications need to be refilled? (please list name of each medication and dose if known)   dapagliflozin  propanediol (FARXIGA ) 10 MG TABS tablet    2. Which pharmacy/location (including street and city if local pharmacy) is medication to be sent to?  [id number] - [place], [place] - [address] AT [facility] OF [address]      3. Do they need a 30 day or 90 day supply? 90 day    Pt is out of medication

## 2023-10-29 ENCOUNTER — Ambulatory Visit: Payer: Self-pay | Admitting: Cardiovascular Disease

## 2023-10-29 NOTE — Progress Notes (Signed)
 Patient ID: Jesus Oconnor, male   DOB: September 09, 1933, 89 y.o.   MRN: [id number]  Follow-up: Left ear eustachian tube dysfunction, left middle ear effusion, conductive hearing loss  HPI: The patient is an 89 year old male who returns today with his daughter.  The patient was previously seen for left middle ear effusion and left ear conductive hearing loss.  At his last visit in October 2024, his left middle ear effusion had resolved.  He was noted to have bilateral sensorineural hearing loss.  The left ear conductive hearing loss component had resolved.  The patient was instructed to perform the Valsalva exercise regularly.  According to the daughter, the patient has been experiencing progressive worsening of his hearing.  He denies any otalgia or otorrhea.  Exam: General: Communicates without difficulty, well nourished, no acute distress. Head: Normocephalic, no evidence injury, no tenderness, facial buttresses intact without stepoff. Face/sinus: No tenderness to palpation and percussion. Facial movement is normal and symmetric. Eyes: PERRL, EOMI. No scleral icterus, conjunctivae clear. Neuro: CN II exam reveals vision grossly intact.  No nystagmus at any point of gaze. Ears: Auricles well formed without lesions.  Bilateral cerumen impaction.  Nose: External evaluation reveals normal support and skin without lesions.  Dorsum is intact.  Anterior rhinoscopy reveals congested mucosa over anterior aspect of inferior turbinates and intact septum.  No purulence noted. Oral:  Oral cavity and oropharynx are intact, symmetric, without erythema or edema.  Mucosa is moist without lesions. Neck: Full range of motion without pain.  There is no significant lymphadenopathy.  No masses palpable.  Thyroid  bed within normal limits to palpation.  Parotid glands and submandibular glands equal bilaterally without mass.  Trachea is midline. Neuro:  CN 2-12 grossly intact.   Procedure: Bilateral cerumen disimpaction Anesthesia:  None Description: Under the operating microscope, the cerumen is carefully removed with a combination of cerumen currette, alligator forceps, and suction catheters.  After the cerumen is removed, the TMs are noted to be retracted, with middle ear effusion.  No mass, erythema, or lesions. The patient tolerated the procedure well.    Assessment: 1.  Bilateral cerumen impaction.  After the disimpaction procedure, both tympanic membranes are noted to be retracted, with middle ear effusion. 2.  History of bilateral sensorineural hearing loss.  It is likely he also has conductive hearing loss component due to the middle ear effusion. 3.  Chronic rhinitis with nasal mucosal congestion and bilateral eustachian tube dysfunction.  Plan: 1.  Otomicroscopy with bilateral cerumen disimpaction. 2.  The physical exam findings are reviewed with the patient and his daughter. 3.  Flonase  nasal spray 2 sprays each nostril daily. 4.  Valsalva exercise multiple times a day. 5.  The patient will return for reevaluation in 2 months.  If he continues to be symptomatic, he may benefit from myringotomy and tube placement.

## 2023-11-02 DIAGNOSIS — D034 Melanoma in situ of scalp and neck: Secondary | ICD-10-CM | POA: Diagnosis not present

## 2023-11-02 DIAGNOSIS — L821 Other seborrheic keratosis: Secondary | ICD-10-CM | POA: Diagnosis not present

## 2023-11-02 DIAGNOSIS — L720 Epidermal cyst: Secondary | ICD-10-CM | POA: Diagnosis not present

## 2023-11-02 DIAGNOSIS — Z85828 Personal history of other malignant neoplasm of skin: Secondary | ICD-10-CM | POA: Diagnosis not present

## 2023-11-03 DIAGNOSIS — E538 Deficiency of other specified B group vitamins: Secondary | ICD-10-CM | POA: Diagnosis not present

## 2023-11-07 ENCOUNTER — Other Ambulatory Visit: Payer: Self-pay | Admitting: Cardiovascular Disease

## 2023-11-07 DIAGNOSIS — I480 Paroxysmal atrial fibrillation: Secondary | ICD-10-CM

## 2023-11-08 NOTE — Telephone Encounter (Signed)
 Prescription refill request for Eliquis  received. Indication:afib Last office visit:3/25 Scr:1.87 Age: 89 Weight:90.2  kg  Prescription refilled

## 2023-11-30 NOTE — Progress Notes (Signed)
 Remote PPM Transmission

## 2023-12-01 DIAGNOSIS — E538 Deficiency of other specified B group vitamins: Secondary | ICD-10-CM | POA: Diagnosis not present

## 2023-12-07 DIAGNOSIS — L988 Other specified disorders of the skin and subcutaneous tissue: Secondary | ICD-10-CM | POA: Diagnosis not present

## 2023-12-29 ENCOUNTER — Ambulatory Visit (INDEPENDENT_AMBULATORY_CARE_PROVIDER_SITE_OTHER): Admitting: Otolaryngology

## 2023-12-29 DIAGNOSIS — E538 Deficiency of other specified B group vitamins: Secondary | ICD-10-CM | POA: Diagnosis not present

## 2024-01-16 ENCOUNTER — Ambulatory Visit: Payer: Medicare Other

## 2024-01-16 DIAGNOSIS — I480 Paroxysmal atrial fibrillation: Secondary | ICD-10-CM

## 2024-01-22 LAB — CUP PACEART REMOTE DEVICE CHECK
Battery Impedance: 2846 Ohm
Battery Remaining Longevity: 28 mo
Battery Voltage: 2.73 V
Brady Statistic RV Percent Paced: 99 %
Date Time Interrogation Session: 20251116150948
Implantable Lead Connection Status: 753985
Implantable Lead Connection Status: 753985
Implantable Lead Implant Date: 20140318
Implantable Lead Implant Date: 20140318
Implantable Lead Location: 753859
Implantable Lead Location: 753860
Implantable Lead Model: 5076
Implantable Lead Model: 5076
Implantable Pulse Generator Implant Date: 20140318
Lead Channel Impedance Value: 616 Ohm
Lead Channel Impedance Value: 67 Ohm
Lead Channel Pacing Threshold Amplitude: 0.875 V
Lead Channel Pacing Threshold Pulse Width: 0.4 ms
Lead Channel Setting Pacing Amplitude: 2.5 V
Lead Channel Setting Pacing Pulse Width: 0.4 ms
Lead Channel Setting Sensing Sensitivity: 2.8 mV
Zone Setting Status: 755011
Zone Setting Status: 755011

## 2024-01-23 NOTE — Progress Notes (Signed)
 Remote PPM Transmission

## 2024-01-29 ENCOUNTER — Ambulatory Visit: Payer: Self-pay | Admitting: Cardiovascular Disease

## 2024-03-02 ENCOUNTER — Other Ambulatory Visit: Payer: Self-pay | Admitting: Cardiovascular Disease

## 2024-03-06 ENCOUNTER — Telehealth: Payer: Self-pay | Admitting: Cardiovascular Disease

## 2024-03-06 ENCOUNTER — Other Ambulatory Visit: Payer: Self-pay | Admitting: Physician Assistant

## 2024-03-06 NOTE — Telephone Encounter (Signed)
" °*  STAT* If patient is at the pharmacy, call can be transferred to refill team.   1. Which medications need to be refilled? (please list name of each medication and dose if known)   dapagliflozin  propanediol (FARXIGA ) 10 MG TABS tablet  torsemide  (DEMADEX ) 20 MG tablet    2. Would you like to learn more about the convenience, safety, & potential cost savings by using the [facility]? No   3. Are you open to using the [facility] (Type [facility]. ) No   4. Which pharmacy/location (including street and city if local pharmacy) is medication to be sent to?[id number] - , [place] - [address] AT [facility] OF [address]    5. Do they need a 30 day or 90 day supply? 90 day  "

## 2024-03-11 MED ORDER — DAPAGLIFLOZIN PROPANEDIOL 10 MG PO TABS: 10.0000 mg | ORAL_TABLET | Freq: Every day | ORAL | 0 refills | Status: AC

## 2024-03-11 NOTE — Telephone Encounter (Signed)
 Pt's medications were sent to pt's pharmacy as requested. Confirmation received.
